# Patient Record
Sex: Female | Born: 1980 | Race: White | Hispanic: Yes | Marital: Single | Smoking: Current every day smoker
Health system: Southern US, Community
[De-identification: ages and names within clinical notes are randomized; demographics above are authoritative.]

## PROBLEM LIST (undated history)

## (undated) ENCOUNTER — Inpatient Hospital Stay (HOSPITAL_COMMUNITY): Payer: Self-pay

## (undated) DIAGNOSIS — J45909 Unspecified asthma, uncomplicated: Secondary | ICD-10-CM

## (undated) DIAGNOSIS — R45851 Suicidal ideations: Secondary | ICD-10-CM

## (undated) DIAGNOSIS — F429 Obsessive-compulsive disorder, unspecified: Secondary | ICD-10-CM

## (undated) DIAGNOSIS — M069 Rheumatoid arthritis, unspecified: Secondary | ICD-10-CM

## (undated) DIAGNOSIS — G40909 Epilepsy, unspecified, not intractable, without status epilepticus: Secondary | ICD-10-CM

## (undated) DIAGNOSIS — K5900 Constipation, unspecified: Secondary | ICD-10-CM

## (undated) DIAGNOSIS — Z9889 Other specified postprocedural states: Secondary | ICD-10-CM

## (undated) DIAGNOSIS — T4145XA Adverse effect of unspecified anesthetic, initial encounter: Secondary | ICD-10-CM

## (undated) DIAGNOSIS — I619 Nontraumatic intracerebral hemorrhage, unspecified: Secondary | ICD-10-CM

## (undated) DIAGNOSIS — H9190 Unspecified hearing loss, unspecified ear: Secondary | ICD-10-CM

## (undated) DIAGNOSIS — Q6589 Other specified congenital deformities of hip: Secondary | ICD-10-CM

## (undated) DIAGNOSIS — F603 Borderline personality disorder: Secondary | ICD-10-CM

## (undated) DIAGNOSIS — F329 Major depressive disorder, single episode, unspecified: Secondary | ICD-10-CM

## (undated) DIAGNOSIS — F4310 Post-traumatic stress disorder, unspecified: Secondary | ICD-10-CM

## (undated) DIAGNOSIS — R112 Nausea with vomiting, unspecified: Secondary | ICD-10-CM

## (undated) DIAGNOSIS — I10 Essential (primary) hypertension: Secondary | ICD-10-CM

## (undated) DIAGNOSIS — T8859XA Other complications of anesthesia, initial encounter: Secondary | ICD-10-CM

## (undated) DIAGNOSIS — F419 Anxiety disorder, unspecified: Secondary | ICD-10-CM

## (undated) DIAGNOSIS — J189 Pneumonia, unspecified organism: Secondary | ICD-10-CM

## (undated) DIAGNOSIS — R011 Cardiac murmur, unspecified: Secondary | ICD-10-CM

## (undated) DIAGNOSIS — F39 Unspecified mood [affective] disorder: Secondary | ICD-10-CM

## (undated) DIAGNOSIS — J3081 Allergic rhinitis due to animal (cat) (dog) hair and dander: Secondary | ICD-10-CM

## (undated) DIAGNOSIS — F319 Bipolar disorder, unspecified: Secondary | ICD-10-CM

## (undated) DIAGNOSIS — T1491XA Suicide attempt, initial encounter: Secondary | ICD-10-CM

## (undated) DIAGNOSIS — F209 Schizophrenia, unspecified: Secondary | ICD-10-CM

## (undated) DIAGNOSIS — F4312 Post-traumatic stress disorder, chronic: Secondary | ICD-10-CM

## (undated) HISTORY — DX: Suicide attempt, initial encounter: T14.91XA

## (undated) HISTORY — DX: Other specified congenital deformities of hip: Q65.89

## (undated) HISTORY — DX: Epilepsy, unspecified, not intractable, without status epilepticus: G40.909

## (undated) HISTORY — DX: Suicidal ideations: R45.851

## (undated) HISTORY — PX: OTHER SURGICAL HISTORY: SHX169

## (undated) HISTORY — DX: Major depressive disorder, single episode, unspecified: F32.9

## (undated) HISTORY — DX: Unspecified mood (affective) disorder: F39

## (undated) HISTORY — DX: Nontraumatic intracerebral hemorrhage, unspecified: I61.9

## (undated) HISTORY — DX: Unspecified asthma, uncomplicated: J45.909

## (undated) HISTORY — DX: Bipolar disorder, unspecified: F31.9

## (undated) HISTORY — DX: Allergic rhinitis due to animal (cat) (dog) hair and dander: J30.81

## (undated) HISTORY — DX: Anxiety disorder, unspecified: F41.9

## (undated) HISTORY — DX: Borderline personality disorder: F60.3

## (undated) HISTORY — DX: Rheumatoid arthritis, unspecified: M06.9

## (undated) HISTORY — DX: Cardiac murmur, unspecified: R01.1

## (undated) HISTORY — DX: Obsessive-compulsive disorder, unspecified: F42.9

## (undated) HISTORY — DX: Post-traumatic stress disorder, chronic: F43.12

---
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
## 2017-08-15 ENCOUNTER — Emergency Department (HOSPITAL_COMMUNITY)
Admission: EM | Admit: 2017-08-15 | Discharge: 2017-08-16 | Disposition: A | Discharge status: Other Acute Inpatient Hospital | Payer: Medicare (Managed Care) | Attending: Emergency Medicine | Admitting: Emergency Medicine

## 2017-08-15 ENCOUNTER — Encounter (HOSPITAL_COMMUNITY): Payer: Self-pay | Admitting: *Deleted

## 2017-08-15 DIAGNOSIS — J45909 Unspecified asthma, uncomplicated: Secondary | ICD-10-CM | POA: Insufficient documentation

## 2017-08-15 DIAGNOSIS — F251 Schizoaffective disorder, depressive type: Secondary | ICD-10-CM | POA: Diagnosis not present

## 2017-08-15 DIAGNOSIS — Z79899 Other long term (current) drug therapy: Secondary | ICD-10-CM | POA: Insufficient documentation

## 2017-08-15 DIAGNOSIS — R45851 Suicidal ideations: Secondary | ICD-10-CM | POA: Insufficient documentation

## 2017-08-15 DIAGNOSIS — R443 Hallucinations, unspecified: Secondary | ICD-10-CM | POA: Diagnosis present

## 2017-08-15 HISTORY — DX: Unspecified asthma, uncomplicated: J45.909

## 2017-08-15 LAB — SALICYLATE LEVEL: Salicylate Lvl: <7 mg/dL (ref 2.8–30.0)

## 2017-08-15 LAB — I-STAT BETA HCG BLOOD, ED (MC, WL, AP ONLY): I-stat hCG, quantitative: <5 m[IU]/mL (ref <5)

## 2017-08-15 LAB — CBC
HEMATOCRIT: 47.2 % — AB (ref 36.0–46.0)
HEMOGLOBIN: 16.3 g/dL — AB (ref 12.0–15.0)
MCH: 31.4 pg (ref 26.0–34.0)
MCHC: 34.5 g/dL (ref 30.0–36.0)
MCV: 90.9 fL (ref 78.0–100.0)
Platelets: 342 10*3/uL (ref 150–400)
RBC: 5.19 MIL/uL — AB (ref 3.87–5.11)
RDW: 13.9 % (ref 11.5–15.5)
WBC: 13.3 10*3/uL — ABNORMAL HIGH (ref 4.0–10.5)

## 2017-08-15 LAB — COMPREHENSIVE METABOLIC PANEL
ALBUMIN: 4.2 g/dL (ref 3.5–5.0)
ALK PHOS: 90 U/L (ref 38–126)
ALT: 12 U/L — AB (ref 14–54)
ANION GAP: 5 (ref 5–15)
AST: 17 U/L (ref 15–41)
BUN: 12 mg/dL (ref 6–20)
CALCIUM: 9.4 mg/dL (ref 8.9–10.3)
CHLORIDE: 110 mmol/L (ref 101–111)
CO2: 24 mmol/L (ref 22–32)
CREATININE: 0.78 mg/dL (ref 0.44–1.00)
GFR calc Af Amer: >60 mL/min (ref >60)
GFR calc non Af Amer: >60 mL/min (ref >60)
GLUCOSE: 89 mg/dL (ref 65–99)
Potassium: 4.2 mmol/L (ref 3.5–5.1)
SODIUM: 139 mmol/L (ref 135–145)
Total Bilirubin: 0.6 mg/dL (ref 0.3–1.2)
Total Protein: 7.4 g/dL (ref 6.5–8.1)

## 2017-08-15 LAB — ETHANOL: Alcohol, Ethyl (B): <10 mg/dL (ref <10)

## 2017-08-15 LAB — ACETAMINOPHEN LEVEL

## 2017-08-15 MED ORDER — QUETIAPINE FUMARATE 300 MG PO TABS
500.0000 mg | ORAL_TABLET | Freq: Every day | ORAL | Status: DC
  Administered 2017-08-15: 22:00:00 500 mg via ORAL
  Filled 2017-08-15: qty 2

## 2017-08-15 MED ORDER — OXYBUTYNIN CHLORIDE 5 MG PO TABS
5.0000 mg | ORAL_TABLET | Freq: Two times a day (BID) | ORAL | Status: DC
  Administered 2017-08-15 – 2017-08-16 (×2): 5 mg via ORAL
  Filled 2017-08-15 (×2): qty 1

## 2017-08-15 MED ORDER — ALBUTEROL SULFATE HFA 108 (90 BASE) MCG/ACT IN AERS: 1.0000 | INHALATION_SPRAY | Freq: Four times a day (QID) | RESPIRATORY_TRACT | Status: DC | PRN

## 2017-08-15 MED ORDER — LAMOTRIGINE 25 MG PO TABS
125.0000 mg | ORAL_TABLET | Freq: Two times a day (BID) | ORAL | Status: DC
  Administered 2017-08-15 – 2017-08-16 (×2): 125 mg via ORAL
  Filled 2017-08-15 (×2): qty 1

## 2017-08-15 MED ORDER — CITALOPRAM HYDROBROMIDE 10 MG PO TABS
20.0000 mg | ORAL_TABLET | Freq: Every morning | ORAL | Status: DC
  Administered 2017-08-16: 20 mg via ORAL
  Filled 2017-08-15: qty 2

## 2017-08-15 MED ORDER — QUETIAPINE FUMARATE 100 MG PO TABS: 100.0000 mg | ORAL_TABLET | Freq: Every evening | ORAL | Status: DC | PRN

## 2017-08-15 MED ORDER — PERPHENAZINE 4 MG PO TABS
4.0000 mg | ORAL_TABLET | Freq: Three times a day (TID) | ORAL | Status: DC
  Administered 2017-08-15 – 2017-08-16 (×3): 4 mg via ORAL
  Filled 2017-08-15 (×4): qty 1

## 2017-08-15 MED ORDER — LAMOTRIGINE 100 MG PO TABS: 100.0000 mg | ORAL_TABLET | Freq: Two times a day (BID) | ORAL | Status: DC

## 2017-08-15 MED ORDER — PROPRANOLOL HCL 20 MG PO TABS: 20.0000 mg | ORAL_TABLET | Freq: Three times a day (TID) | ORAL | Status: DC

## 2017-08-15 MED ORDER — PROPRANOLOL HCL 20 MG PO TABS
30.0000 mg | ORAL_TABLET | Freq: Three times a day (TID) | ORAL | Status: DC
  Administered 2017-08-15 (×2): 30 mg via ORAL
  Filled 2017-08-15 (×5): qty 1

## 2017-08-15 MED ORDER — LAMOTRIGINE 25 MG PO TABS: 25.0000 mg | ORAL_TABLET | Freq: Two times a day (BID) | ORAL | Status: DC

## 2017-08-15 MED ORDER — TOPIRAMATE 25 MG PO TABS
150.0000 mg | ORAL_TABLET | Freq: Every day | ORAL | Status: DC
  Administered 2017-08-15: 22:00:00 150 mg via ORAL
  Filled 2017-08-15: qty 1

## 2017-08-15 MED ORDER — QUETIAPINE FUMARATE 300 MG PO TABS: 400.0000 mg | ORAL_TABLET | Freq: Every day | ORAL | Status: DC

## 2017-08-15 MED ORDER — PROPRANOLOL HCL 10 MG PO TABS: 10.0000 mg | ORAL_TABLET | Freq: Three times a day (TID) | ORAL | Status: DC

## 2017-08-15 MED ORDER — ACETAMINOPHEN 325 MG PO TABS: 650.0000 mg | ORAL_TABLET | ORAL | Status: DC | PRN

## 2017-08-15 NOTE — ED Triage Notes (Signed)
Pt called mobile crisis d/'t SI, AV hallucinations. Pt states she is seeing shadow people that carry knives. Pt states she has had SI for the past few days.

## 2017-08-15 NOTE — Progress Notes (Signed)
Patient chart reviewed.  Patient meets inpatient criteria.  Referrals sent to the following:  [facility]  [facility]  [facility]        Disposition TTS and CSW's will continue to follow for placement.  Areatha Keas. Judi Cong, MSW, [place] Disposition Clinical Social Work [phone] (cell) [phone] (office)

## 2017-08-15 NOTE — ED Provider Notes (Signed)
[facility] DEPT Provider Note   CSN: [id number] Arrival date & time: 08/15/17  1353     History   Chief Complaint Chief Complaint  Patient presents with  . Suicidal  . Hallucinations    HPI Amanda [name] is a 36 y.o. female w PMHx of asthma, and reported psychiatric hx of MDD, anxiety, PTSD, OCD, insomnia, anorexia, self-harm, presenting to ED with worsening symptoms x few days of auditroy and visual hallucinations. Pt states "I see blood everywhere" and see "shadows with knives and green angry eyes" that are telling her to harm herself. She states if she were to end her life she would "cut."  She denies any homicidal ideations, recent self-harm, alcohol or drug use.  She states she has been taking her prescribed medicines as directed, however is due for her 3PM medications. Denies hx of schizophrenia, but states she has had auditory and visual hallucinations in the past. States her therapist is [place] at [facility].  The history is provided by the patient.    Past Medical History:  Diagnosis Date  . Asthma     There are no active problems to display for this patient.    OB History    No data available       Home Medications    Prior to Admission medications   Medication Sig Start Date End Date Taking? Authorizing Provider  acetaminophen (TYLENOL) 500 MG tablet Take 1,000 mg by mouth daily as needed (PAIN).   Yes [provider]  citalopram (CELEXA) 20 MG tablet Take 20 mg by mouth every morning.   Yes [provider]  lamoTRIgine (LAMICTAL) 100 MG tablet Take 100 mg by mouth 2 (two) times daily. TAKE WITH 25 MG TABLETS   Yes [provider]  lamoTRIgine (LAMICTAL) 25 MG tablet Take 25 mg by mouth 2 (two) times daily.   Yes [provider]  Omega-3 Fatty Acids (FISH OIL) 1000 MG CAPS Take 1,000 mg by mouth every morning.   Yes [provider]  oxybutynin (DITROPAN) 5 MG tablet Take 5 mg by mouth 2  (two) times daily.   Yes [provider]  perphenazine (TRILAFON) 4 MG tablet Take 4 mg by mouth 3 (three) times daily. AS DIRECTED   Yes [provider]  propranolol (INDERAL) 10 MG tablet Take 10 mg by mouth 3 (three) times daily. TAKE WITH 20 MG TABLETS   Yes [provider]  propranolol (INDERAL) 20 MG tablet Take 20 mg by mouth 3 (three) times daily. AS DIRECTED   Yes [provider]  QUEtiapine (SEROQUEL) 100 MG tablet Take 100 mg by mouth at bedtime as needed (SLEEP). AS NEEDED. COMBINE WITH 400 MG TABLET TO EQUAL 500 MG AT BEDTIME   Yes [provider]  QUEtiapine (SEROQUEL) 400 MG tablet Take 400 mg by mouth at bedtime.   Yes [provider]  topiramate (TOPAMAX) 100 MG tablet Take 150 mg by mouth at bedtime.   Yes [provider]    Family History No family history on file.  Social History Social History   Tobacco Use  . Smoking status: Not on file  Substance Use Topics  . Alcohol use: No    Frequency: Never  . Drug use: No     Allergies   Aspartame and phenylalanine; Benadryl [diphenhydramine]; Scallops [shellfish allergy]; and Tegretol [carbamazepine]   Review of Systems Review of Systems  Constitutional: Negative for fever.  Skin: Negative for wound.  Psychiatric/Behavioral: Positive for  hallucinations and suicidal ideas. Negative for self-injury. The patient is nervous/anxious.   All other systems reviewed and are negative.    Physical Exam Updated Vital Signs BP 122/86 (BP Location: Left Arm)   Pulse 75   Temp 97.8 F (36.6 C) (Oral)   Resp 18   Ht 5\' 8"  (1.727 m)   Wt 100.5 kg (221 lb 8 oz)   SpO2 99%   BMI 33.68 kg/m   Physical Exam  Constitutional: She is oriented to person, place, and time. She appears well-developed and well-nourished. No distress.  HENT:  Head: Normocephalic and atraumatic.  Eyes: Conjunctivae are normal.  Cardiovascular: Normal rate, regular rhythm, normal heart  sounds and intact distal pulses.  Pulmonary/Chest: Effort normal and breath sounds normal. No respiratory distress.  Abdominal: Soft. Bowel sounds are normal.  Neurological: She is alert and oriented to person, place, and time.  Mental Status:  Alert, oriented, thought content appropriate, able to give a coherent history. Speech fluent without evidence of aphasia. Able to follow 2 step commands without difficulty.  Cranial Nerves:  II:  Peripheral visual fields grossly normal, pupils equal, round, reactive to light III,IV, VI: ptosis not present, extra-ocular motions intact bilaterally  V,VII: smile symmetric, facial light touch sensation equal VIII: hearing grossly normal to voice  X: uvula elevates symmetrically  XI: bilateral shoulder shrug symmetric and strong XII: midline tongue extension without fassiculations Motor:  Normal tone. 5/5 in upper and lower extremities bilaterally including strong and equal grip strength and dorsiflexion/plantar flexion Sensory: Pinprick and light touch normal in all extremities.  Deep Tendon Reflexes: 2+ and symmetric in the biceps and patella Cerebellar: normal finger-to-nose with bilateral upper extremities Gait: normal gait and balance CV: distal pulses palpable throughout    Skin: Skin is warm.  No wounds  Psychiatric: She has a normal mood and affect. Her speech is normal and behavior is normal. She expresses suicidal ideation. She expresses no homicidal ideation. She expresses suicidal plans.  Pt calm and cooperative. Does not appear to be actively hallucinating during evaluation.   Nursing note and vitals reviewed.    ED Treatments / Results  Labs (all labs ordered are listed, but only abnormal results are displayed) Labs Reviewed  COMPREHENSIVE METABOLIC PANEL - Abnormal; Notable for the following components:      Result Value   ALT 12 (*)    All other components within normal limits  ACETAMINOPHEN LEVEL - Abnormal; Notable for the  following components:   Acetaminophen (Tylenol), Serum <10 (*)    All other components within normal limits  CBC - Abnormal; Notable for the following components:   WBC 13.3 (*)    RBC 5.19 (*)    Hemoglobin 16.3 (*)    HCT 47.2 (*)    All other components within normal limits  ETHANOL  SALICYLATE LEVEL  RAPID URINE DRUG SCREEN, HOSP PERFORMED  I-STAT BETA HCG BLOOD, ED ([facility], [facility], [facility] ONLY)    EKG  EKG Interpretation None       Radiology No results found.  Procedures Procedures (including critical care time)  Medications Ordered in ED Medications  acetaminophen (TYLENOL) tablet 650 mg (not administered)  albuterol (PROVENTIL HFA;VENTOLIN HFA) 108 (90 Base) MCG/ACT inhaler 1-2 puff (not administered)  citalopram (CELEXA) tablet 20 mg (not administered)  oxybutynin (DITROPAN) tablet 5 mg (5 mg Oral Given 08/15/17 2136)  perphenazine (TRILAFON) tablet 4 mg (4 mg Oral Given 08/15/17 2135)  topiramate (TOPAMAX) tablet 150 mg (150 mg Oral Given 08/15/17  2135)  lamoTRIgine (LAMICTAL) tablet 125 mg (125 mg Oral Given 08/15/17 2136)  propranolol (INDERAL) tablet 30 mg (30 mg Oral Given 08/15/17 1759)  QUEtiapine (SEROQUEL) tablet 500 mg (500 mg Oral Given 08/15/17 2135)     Initial Impression / Assessment and Plan / ED Course  I have reviewed the triage vital signs and the nursing notes.  Pertinent labs & imaging results that were available during my care of the patient were reviewed by me and considered in my medical decision making (see chart for details).     Pt presenting with AV hallucinations and SI. AV hallucinations reported as: "I see blood everywhere" and see "shadows with knives and green angry eyes" that are telling her to harm herself. Taking multiple psychiatric medications, seen by "Di Kindle at [facility] No medical complaints today. Denies drug or alcohol use. Exam reassuring. Labs pending. Pt is medically cleared for TTS evaluation.   TTS recommending psychiatric  admission.  The patient appears reasonably stabilized for admission considering the current resources, flow, and capabilities available in the ED at this time, and I doubt any other [facility] requiring further screening and/or treatment in the ED prior to admission.  Final Clinical Impressions(s) / ED Diagnoses   Final diagnoses:  Suicidal ideation  Hallucinations    ED Discharge Orders    None       Cap Massi, Martin[place] N, PA-C 08/15/17 2204    Sherwood Gambler, MD 08/16/17 0010

## 2017-08-15 NOTE — ED Notes (Signed)
Patient reports SI no plan and AVH. Patient denies HI. Plan of care discussed. Encouragement and support provided and safety maintain. Q 15 min safety checks remain in place and video monitoring.

## 2017-08-15 NOTE — ED Notes (Signed)
Bed: [facility] Expected date:  Expected time:  Means of arrival:  Comments: Consultation room
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
## 2017-08-15 NOTE — ED Notes (Signed)
Bed: [facility] Expected date:  Expected time:  Means of arrival:  Comments: 

## 2017-08-15 NOTE — BHH Counselor (Signed)
Clinician received a call from [facility] at [facility]. Pt has been accepted pending if she is willing to sign-in voluntary. Aldona Bar [phone]) asked to be contacted during day shift of the pt's decision. (Pt is currently sleeping.)   Pt if agrees. Pt has been accepted to the [facility], tomorrow after 10am. Attending physician: Jonelle Sports. Nursing report: [phone]. Updated disposition discussed with [place], RN.    Vertell Novak, MS, [facility], [facility] Triage Specialist [phone]

## 2017-08-15 NOTE — ED Notes (Signed)
Pt admitted to room #41. Pt endorsing AVH. "I've been seeing blood everywhere with evil shadow people with green eyes." Pt  Endorsing SI  Without plan. Pt denies HI. Pt reports she has been complaint with medication regimen. Encouragement and support provided. Special checks q 15 mins in place for safety, Video monitoring in place. Will continue to monitor.

## 2017-08-15 NOTE — ED Notes (Signed)
Bed: [facility] Expected date:  Expected time:  Means of arrival:  Comments: 

## 2017-08-16 DIAGNOSIS — F251 Schizoaffective disorder, depressive type: Secondary | ICD-10-CM | POA: Diagnosis present

## 2017-08-16 LAB — RAPID URINE DRUG SCREEN, HOSP PERFORMED
AMPHETAMINES: NOT DETECTED
BARBITURATES: NOT DETECTED
Benzodiazepines: NOT DETECTED
COCAINE: NOT DETECTED
OPIATES: NOT DETECTED
TETRAHYDROCANNABINOL: NOT DETECTED

## 2017-08-16 NOTE — BH Assessment (Signed)
[facility] Assessment Progress Note  This pt is currently under voluntary status.  At 09:09 this Probation officer discussed plan to transfer [facility] to [facility] with the pt; she agrees to transfer.  Buford Dresser, DO concurs with this plan.  Pt's nurse, Caren Griffins, has been notified.  Pt is to be transported via [employer].  Pt has signed Consent to Release information to Theodoro Kos, her provider at [facility], and I have called to notify her of disposition.  Jalene Mullet, [place] Coordinator [phone]

## 2017-08-16 NOTE — ED Notes (Signed)
[facility] called for transport. 

## 2017-08-16 NOTE — ED Notes (Signed)
Report called to Sherryll Burger at [facility].

## 2017-08-16 NOTE — ED Notes (Signed)
Patient reports she is still hearing voices, and feels suicidal.  She reports that her medications need adjusting because she has been taking her meds.

## 2017-08-20 NOTE — L&D Delivery Note (Signed)
Operative Delivery Note At 1:13 AM a viable female was delivered via Vaginal, Vacuum (Extractor) due to recurrent late decelerations.  Presentation: vertex; Position: Occiput,, Posterior; Station: +2.  Verbal consent: obtained from patient.  Risks and benefits discussed in detail.  Risks include, but are not limited to the risks of anesthesia, bleeding, infection, damage to maternal tissues, fetal cephalhematoma.  There is also the risk of inability to effect vaginal delivery of the head, or shoulder dystocia that cannot be resolved by established maneuvers, leading to the need for emergency cesarean section.  APGAR:7 , 9  .   Placenta status:intact with 3-vessel cord, .     Anesthesia:  Epidural Instruments: Vacuum  Episiotomy:  none Lacerations:  Second degree Suture Repair: 2.0 vicryl Est. Blood Loss (mL):  300  Mom to postpartum.  Baby to Couplet care / Skin to Skin.  Nancylee Gaines 07/26/2018, 1:41 AM

## 2017-11-06 ENCOUNTER — Encounter: Payer: Self-pay | Admitting: Neurology

## 2017-11-06 ENCOUNTER — Ambulatory Visit (INDEPENDENT_AMBULATORY_CARE_PROVIDER_SITE_OTHER): Payer: Self-pay | Admitting: Neurology

## 2017-11-06 ENCOUNTER — Telehealth: Payer: Self-pay | Admitting: Neurology

## 2017-11-06 ENCOUNTER — Encounter: Payer: Self-pay | Admitting: *Deleted

## 2017-11-06 VITALS — BP 106/71 | HR 69 | Ht 60.0 in | Wt 216.0 lb

## 2017-11-06 DIAGNOSIS — G40909 Epilepsy, unspecified, not intractable, without status epilepticus: Secondary | ICD-10-CM

## 2017-11-06 DIAGNOSIS — Q279 Congenital malformation of peripheral vascular system, unspecified: Secondary | ICD-10-CM

## 2017-11-06 DIAGNOSIS — R51 Headache: Secondary | ICD-10-CM

## 2017-11-06 DIAGNOSIS — R519 Headache, unspecified: Secondary | ICD-10-CM

## 2017-11-06 DIAGNOSIS — R569 Unspecified convulsions: Secondary | ICD-10-CM

## 2017-11-06 DIAGNOSIS — Q2730 Arteriovenous malformation, site unspecified: Secondary | ICD-10-CM

## 2017-11-06 MED ORDER — TOPIRAMATE 100 MG PO TABS: 150.0000 mg | ORAL_TABLET | Freq: Every day | ORAL | 6 refills | Status: DC

## 2017-11-06 NOTE — Patient Instructions (Signed)
Seizure, Adult A seizure is a sudden burst of abnormal electrical activity in the brain. The abnormal activity temporarily interrupts normal brain function, causing a person to experience any of the following:  Involuntary movements.  Changes in awareness or consciousness.  Uncontrollable shaking (convulsions).  Seizures usually last from 30 seconds to 2 minutes. They usually do not cause permanent brain damage unless they are prolonged. What can cause a seizure to happen? Seizures can happen for many reasons including:  A fever.  Low blood sugar.  A medicine.  An illnesses.  A brain injury.  Some people who have a seizure never have another one. People who have repeated seizures have a condition called epilepsy. What are the symptoms of a seizure? Symptoms of a seizure vary greatly from person to person. They include:  Convulsions.  Stiffening of the body.  Involuntary movements of the arms or legs.  Loss of consciousness.  Breathing problems.  Falling suddenly.  Confusion.  Head nodding.  Eye blinking or fluttering.  Lip smacking.  Drooling.  Rapid eye movements.  Grunting.  Loss of bladder control and bowel control.  Staring.  Unresponsiveness.  Some people have symptoms right before a seizure happens (aura) and right after a seizure happens. Symptoms of an aura include:  Fear or anxiety.  Nausea.  Feeling like the room is spinning (vertigo).  A feeling of having seen or heard something before (deja vu).  Odd tastes or smells.  Changes in vision, such as seeing flashing lights or spots.  Symptoms that may follow a seizure include:  Confusion.  Sleepiness.  Headache.  Weakness of one side of the body.  Follow these instructions at home: Medicines   Take over-the-counter and prescription medicines only as told by your health care provider.  Avoid any substances that may prevent your medicine from working properly, such as  alcohol. Activity  Do not drive, swim, or do any other activities that would be dangerous if you had another seizure. Wait until your health care provider approves.  If you live in the [place] check with your local DMV (department of motor vehicles) to find out about the local driving laws. Each state has specific rules about when you can legally return to driving.  Get enough rest. Lack of sleep can make seizures more likely to occur. Educating others Teach friends and family what to do if you have a seizure. They should:  Lay you on the ground to prevent a fall.  Cushion your head and body.  Loosen any tight clothing around your neck.  Turn you on your side. If vomiting occurs, this helps keep your airway clear.  Stay with you until you recover.  Not hold you down. Holding you down will not stop the seizure.  Not put anything in your mouth.  Know whether or not you need emergency care.  General instructions  Contact your health care provider each time you have a seizure.  Avoid anything that has ever triggered a seizure for you.  Keep a seizure diary. Record what you remember about each seizure, especially anything that might have triggered the seizure.  Keep all follow-up visits as told by your health care provider. This is important. Contact a health care provider if:  You have another seizure.  You have seizures more often.  Your seizure symptoms change.  You continue to have seizures with treatment.  You have symptoms of an infection or illness. They might increase your risk of having a seizure. Get help   right away if:  You have a seizure: ? That lasts longer than 5 minutes. ? That is different than previous seizures. ? That leaves you unable to speak or use a part of your body. ? That makes it harder to breathe. ? After a head injury.  You have: ? Multiple seizures in a row. ? Confusion or a severe headache right after a seizure.  You are having  seizures more often.  You do not wake up immediately after a seizure.  You injure yourself during a seizure. These symptoms may represent a serious problem that is an emergency. Do not wait to see if the symptoms will go away. Get medical help right away. Call your local emergency services (911 in the [place] Do not drive yourself to the hospital. This information is not intended to replace advice given to you by your health care provider. Make sure you discuss any questions you have with your health care provider. Document Released: 08/03/2000 Document Revised: 04/01/2016 Document Reviewed: 03/09/2016 Elsevier Interactive Patient Education  2018 [employer]  

## 2017-11-06 NOTE — Telephone Encounter (Signed)
pt medicare is from [place] and she will have to be self pay bc it will not cover anything for [place] order sent to [facility] they will reach out to the patient to schedule.
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
## 2017-11-06 NOTE — Telephone Encounter (Signed)
Patient states she will call us back to schedule her one year follow-up w/ NP, [name] Done.

## 2017-11-08 ENCOUNTER — Telehealth: Payer: Self-pay | Admitting: *Deleted

## 2017-11-08 NOTE — Telephone Encounter (Addendum)
Request faxed to [facility] requesting records, lab test, EEG, Mri to [phone] fax [phone]. On 11/11/17 r/c medical records from [facility], records on [place] desk.

## 2017-11-17 ENCOUNTER — Ambulatory Visit
Admission: RE | Admit: 2017-11-17 | Discharge: 2017-11-17 | Disposition: A | Discharge status: Home / Self Care | Payer: Medicare (Managed Care) | Source: Ambulatory Visit | Attending: Neurology | Admitting: Neurology

## 2017-11-17 DIAGNOSIS — G8929 Other chronic pain: Secondary | ICD-10-CM

## 2017-11-17 DIAGNOSIS — Q2730 Arteriovenous malformation, site unspecified: Secondary | ICD-10-CM

## 2017-11-17 DIAGNOSIS — R569 Unspecified convulsions: Secondary | ICD-10-CM

## 2017-11-17 DIAGNOSIS — R51 Headache: Secondary | ICD-10-CM

## 2017-11-17 DIAGNOSIS — Q279 Congenital malformation of peripheral vascular system, unspecified: Secondary | ICD-10-CM

## 2017-11-17 MED ORDER — GADOBENATE DIMEGLUMINE 529 MG/ML IV SOLN
20.0000 mL | Freq: Once | INTRAVENOUS | Status: AC | PRN
  Administered 2017-11-17: 20 mL via INTRAVENOUS

## 2017-11-20 ENCOUNTER — Telehealth: Payer: Self-pay | Admitting: Neurology

## 2017-11-20 ENCOUNTER — Telehealth: Payer: Self-pay | Admitting: *Deleted

## 2017-11-20 DIAGNOSIS — Q279 Congenital malformation of peripheral vascular system, unspecified: Secondary | ICD-10-CM

## 2017-11-20 DIAGNOSIS — Q2730 Arteriovenous malformation, site unspecified: Secondary | ICD-10-CM

## 2017-11-20 NOTE — Telephone Encounter (Signed)
Medicare order sent to [facility] they will reach out to pt to schedule.

## 2017-11-20 NOTE — Telephone Encounter (Signed)
-----   Message from [name], MD sent at 11/19/2017  3:29 PM EDT ----- MRI if the brain was normal. I would like to do another test, an MRA of the blood vessels since we did not see anything on MRI brain. If she is willing please order for vascular malformations

## 2017-11-20 NOTE — Telephone Encounter (Signed)
Spoke with patient regarding MRI brain. She is aware that it was normal. However, Dr. Jaynee Eagles would like to order an MRA of the vessels since it was normal. RN asked for pt's thoughts and she agreed to having the MRA. RN advised patient that order will be placed and then pt will receive a call for the scheduling. She verbalized appreciation.   MRA head order placed for vascular malformation per v.o. From Dr. Jaynee Eagles.

## 2017-11-30 ENCOUNTER — Other Ambulatory Visit: Payer: Self-pay

## 2017-11-30 ENCOUNTER — Emergency Department (HOSPITAL_COMMUNITY)
Admission: EM | Admit: 2017-11-30 | Discharge: 2017-11-30 | Disposition: A | Discharge status: Home / Self Care | Payer: Medicare (Managed Care) | Attending: Emergency Medicine | Admitting: Emergency Medicine

## 2017-11-30 ENCOUNTER — Encounter (HOSPITAL_COMMUNITY): Payer: Self-pay | Admitting: Emergency Medicine

## 2017-11-30 DIAGNOSIS — Z3491 Encounter for supervision of normal pregnancy, unspecified, first trimester: Secondary | ICD-10-CM | POA: Insufficient documentation

## 2017-11-30 DIAGNOSIS — Z3490 Encounter for supervision of normal pregnancy, unspecified, unspecified trimester: Secondary | ICD-10-CM

## 2017-11-30 DIAGNOSIS — Z3A00 Weeks of gestation of pregnancy not specified: Secondary | ICD-10-CM | POA: Insufficient documentation

## 2017-11-30 DIAGNOSIS — J45909 Unspecified asthma, uncomplicated: Secondary | ICD-10-CM | POA: Insufficient documentation

## 2017-11-30 DIAGNOSIS — F17210 Nicotine dependence, cigarettes, uncomplicated: Secondary | ICD-10-CM | POA: Diagnosis not present

## 2017-11-30 DIAGNOSIS — O219 Vomiting of pregnancy, unspecified: Secondary | ICD-10-CM

## 2017-11-30 DIAGNOSIS — Z79899 Other long term (current) drug therapy: Secondary | ICD-10-CM | POA: Insufficient documentation

## 2017-11-30 DIAGNOSIS — R112 Nausea with vomiting, unspecified: Secondary | ICD-10-CM

## 2017-11-30 LAB — COMPREHENSIVE METABOLIC PANEL
ALT: 12 U/L — ABNORMAL LOW (ref 14–54)
ANION GAP: 8 (ref 5–15)
AST: 15 U/L (ref 15–41)
Albumin: 3.8 g/dL (ref 3.5–5.0)
Alkaline Phosphatase: 99 U/L (ref 38–126)
BUN: 9 mg/dL (ref 6–20)
CHLORIDE: 108 mmol/L (ref 101–111)
CO2: 19 mmol/L — ABNORMAL LOW (ref 22–32)
Calcium: 9.1 mg/dL (ref 8.9–10.3)
Creatinine, Ser: 0.74 mg/dL (ref 0.44–1.00)
GFR calc Af Amer: >60 mL/min (ref >60)
Glucose, Bld: 100 mg/dL — ABNORMAL HIGH (ref 65–99)
Potassium: 3.7 mmol/L (ref 3.5–5.1)
Sodium: 135 mmol/L (ref 135–145)
Total Bilirubin: 0.5 mg/dL (ref 0.3–1.2)
Total Protein: 7.1 g/dL (ref 6.5–8.1)

## 2017-11-30 LAB — CBC
HCT: 45.8 % (ref 36.0–46.0)
Hemoglobin: 15.9 g/dL — ABNORMAL HIGH (ref 12.0–15.0)
MCH: 30.6 pg (ref 26.0–34.0)
MCHC: 34.7 g/dL (ref 30.0–36.0)
MCV: 88.1 fL (ref 78.0–100.0)
Platelets: 319 10*3/uL (ref 150–400)
RBC: 5.2 MIL/uL — AB (ref 3.87–5.11)
RDW: 13.4 % (ref 11.5–15.5)
WBC: 17.9 10*3/uL — AB (ref 4.0–10.5)

## 2017-11-30 LAB — URINALYSIS, ROUTINE W REFLEX MICROSCOPIC
BACTERIA UA: NONE SEEN
BILIRUBIN URINE: NEGATIVE
Glucose, UA: NEGATIVE mg/dL
Ketones, ur: NEGATIVE mg/dL
LEUKOCYTES UA: NEGATIVE
NITRITE: NEGATIVE
PROTEIN: NEGATIVE mg/dL
SPECIFIC GRAVITY, URINE: 1.024 (ref 1.005–1.030)
pH: 5 (ref 5.0–8.0)

## 2017-11-30 LAB — HCG, QUANTITATIVE, PREGNANCY: hCG, Beta Chain, Quant, S: 24 m[IU]/mL — ABNORMAL HIGH (ref <5)

## 2017-11-30 LAB — I-STAT BETA HCG BLOOD, ED (MC, WL, AP ONLY): HCG, QUANTITATIVE: 20.2 m[IU]/mL — AB (ref <5)

## 2017-11-30 LAB — LIPASE, BLOOD: LIPASE: 24 U/L (ref 11–51)

## 2017-11-30 MED ORDER — ONDANSETRON 4 MG PO TBDP: ORAL_TABLET | ORAL | 0 refills | Status: DC

## 2017-11-30 MED ORDER — ONDANSETRON 4 MG PO TBDP
4.0000 mg | ORAL_TABLET | Freq: Once | ORAL | Status: AC
  Administered 2017-11-30: 4 mg via ORAL
  Filled 2017-11-30: qty 1

## 2017-11-30 NOTE — ED Provider Notes (Signed)
[facility] EMERGENCY DEPARTMENT Provider Note   CSN: [id number] Arrival date & time: 11/30/17  1517     History   Chief Complaint Chief Complaint  Patient presents with  . Emesis    HPI Amanda [name] is a 37 y.o. female.  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 Patient is sensitive to certain sense and has only tolerated hot pockets. Abundant jelly with icing Patient denies any vaginal bleeding or abdominal pain. Patient denies any neurologic symptoms. No headaches.  No sick contacts.     Past Medical History:  Diagnosis Date  . Anxiety   . Asthma   . Asthma due to environmental allergies   . Asthma due to seasonal allergies   . Bipolar 1 disorder ([place])   . Borderline personality disorder ([place])   . Brain bleed ([place])   . Chronic post-traumatic stress disorder (PTSD)    complex chronic with psychotic features and self harm behaviors  . Dander (animal) allergy   . Heart murmur   . Hip dysplasia   . Major depression, chronic   . Mood disorder ([place])   . OCD (obsessive compulsive disorder)   . Rheumatoid arthritis ([place])   . Seizure disorder ([place])   . Suicidal ideations   . Suicide attempt [facility])     Patient Active Problem List   Diagnosis Date Noted  . Seizure disorder ([place]) 11/06/2017    Past Surgical History:  Procedure Laterality Date  . EYE SURGERY  1985  . SHUNT REMOVAL  1983  . Tooth Removal     multiple     OB History   None      Home Medications    Prior to Admission medications   Medication Sig Start Date End Date Taking? Authorizing Provider  albuterol (PROVENTIL HFA;VENTOLIN HFA) 108 (90 Base) MCG/ACT inhaler Inhale 2 puffs into the lungs every 6  (six) hours as needed for wheezing or shortness of breath.    [provider]  EPINEPHrine 0.3 mg/0.3 mL IJ SOAJ injection Inject 0.3 mg into the muscle. Use as directed    [provider]  lamoTRIgine (LAMICTAL) 100 MG tablet Take 100 mg by mouth 2 (two) times daily.    [provider]  lamoTRIgine (LAMICTAL) 25 MG tablet Take 25 mg by mouth 2 (two) times daily.    [provider]  Omega-3 Fatty Acids (FISH OIL) 1000 MG CAPS Take 1 capsule by mouth 3 (three) times daily after meals.     [provider]  ondansetron (ZOFRAN ODT) 4 MG disintegrating tablet 4mg  ODT q4 hours prn nausea/vomit 11/30/17   [name], MD  oxybutynin (DITROPAN) 5 MG tablet Take 5 mg by mouth 2 (two) times daily.    [provider]  perphenazine (TRILAFON) 4 MG tablet Take by mouth. 2 mg in AM, 4 mg in afternoon, 4 mg at bedtime    [provider]  propranolol (INDERAL) 10 MG tablet Take 10 mg by mouth 3 (three) times daily.    [provider]  propranolol (INDERAL) 20 MG tablet Take 20 mg by mouth 3 (three) times daily.    [provider]  QUEtiapine (SEROQUEL) 300 MG tablet Take 600 mg by mouth  at bedtime.    [provider]  topiramate (TOPAMAX) 100 MG tablet Take 1.5 tablets (150 mg total) by mouth at bedtime. 11/06/17   [name], MD    Family History Family History  Problem Relation Age of Onset  . Heart attack Mother   . Stroke Mother   . Liver cancer Mother   . Diabetes Mother   . Lung cancer Mother   . Alcoholism Father   . Sleep apnea Brother   . Depression Brother   . ADD / ADHD Brother     Social History Social History   Tobacco Use  . Smoking status: Current Every Day Smoker    Packs/day: 0.50    Types: Cigarettes  . Smokeless tobacco: Never Used  Substance Use Topics  . Alcohol use: Yes    Comment: rare wine  . Drug use: No     Allergies   Bee venom; Benadryl [diphenhydramine]; Other;  Pollen extract; Scallops [shellfish allergy]; and Tegretol [carbamazepine]   Review of Systems Review of Systems  Constitutional: Negative for chills and fever.  HENT: Negative for congestion.   Eyes: Negative for visual disturbance.  Respiratory: Negative for shortness of breath.   Cardiovascular: Negative for chest pain.  Gastrointestinal: Positive for nausea and vomiting. Negative for abdominal pain.  Genitourinary: Negative for dysuria and flank pain.  Musculoskeletal: Negative for back pain, neck pain and neck stiffness.  Skin: Negative for rash.  Neurological: Negative for light-headedness and headaches.     Physical Exam Updated Vital Signs BP (!) 155/89 (BP Location: Right Arm)   Pulse 75   Temp 98.6 F (37 C) (Oral)   Resp 18   Ht 5\' 8"  (1.727 m)   Wt 96.6 kg (213 lb)   LMP 11/07/2017   SpO2 100%   BMI 32.39 kg/m   Physical Exam  Constitutional: She is oriented to person, place, and time. She appears well-developed and well-nourished.  HENT:  Head: Normocephalic and atraumatic.  Eyes: Conjunctivae are normal. Right eye exhibits no discharge. Left eye exhibits no discharge.  Neck: Normal range of motion. Neck supple. No tracheal deviation present.  Cardiovascular: Normal rate and regular rhythm.  Pulmonary/Chest: Effort normal and breath sounds normal.  Abdominal: Soft. She exhibits no distension. There is no tenderness. There is no guarding.  Musculoskeletal: She exhibits no edema.  Neurological: She is alert and oriented to person, place, and time. She has normal strength. No cranial nerve deficit or sensory deficit. She displays no seizure activity. Coordination normal. GCS eye subscore is 4. GCS verbal subscore is 5. GCS motor subscore is 6.  Skin: Skin is warm. No rash noted.  Psychiatric: She has a normal mood and affect.  Nursing note and vitals reviewed.    ED Treatments / Results  Labs (all labs ordered are listed, but only abnormal results are  displayed) Labs Reviewed  COMPREHENSIVE METABOLIC PANEL - Abnormal; Notable for the following components:      Result Value   CO2 19 (*)    Glucose, Bld 100 (*)    ALT 12 (*)    All other components within normal limits  CBC - Abnormal; Notable for the following components:   WBC 17.9 (*)    RBC 5.20 (*)    Hemoglobin 15.9 (*)    All other components within normal limits  URINALYSIS, ROUTINE W REFLEX MICROSCOPIC - Abnormal; Notable for the following components:   APPearance HAZY (*)    Hgb urine dipstick SMALL (*)  Squamous Epithelial / LPF 0-5 (*)    All other components within normal limits  HCG, QUANTITATIVE, PREGNANCY - Abnormal; Notable for the following components:   hCG, Beta Chain, Quant, S 24 (*)    All other components within normal limits  I-STAT BETA HCG BLOOD, ED ([facility], [facility], [facility] ONLY) - Abnormal; Notable for the following components:   I-stat hCG, quantitative 20.2 (*)    All other components within normal limits  LIPASE, BLOOD    EKG None  Radiology No results found.  Procedures Procedures (including critical care time)  Medications Ordered in ED Medications  ondansetron (ZOFRAN-ODT) disintegrating tablet 4 mg (4 mg Oral Given 11/30/17 1855)     Initial Impression / Assessment and Plan / ED Course  I have reviewed the triage vital signs and the nursing notes.  Pertinent labs & imaging results that were available during my care of the patient were reviewed by me and considered in my medical decision making (see chart for details).    Patient presents with recurrent nausea vomiting without abdominal pain. Patient has normal neurologic exam is not having any concerning headaches. Patient is well-appearing smiling and joking with her boyfriend. Patient feels she may be pregnant. I-STAT urgency test was minimally positive and extremely low at 20. Formal blood test ordered. Discussed close follow-up with primary doctor in 2-3 days for reassesment of her  symptoms.  Formal blood pregnancy test minimally positive. Discussed close follow-up with obstetrics. Is having no abdominal pain or vaginal bleeding. No formal ultrasound needed today as it is unlikely to change management.  Results and differential diagnosis were discussed with the patient/parent/guardian. Xrays were independently reviewed by myself.  Close follow up outpatient was discussed, comfortable with the plan.   Medications  ondansetron (ZOFRAN-ODT) disintegrating tablet 4 mg (4 mg Oral Given 11/30/17 1855)    Vitals:   11/30/17 1545 11/30/17 1549 11/30/17 1550  BP: (!) 155/89    Pulse: 75    Resp:  18   Temp: 98.6 F (37 C)    TempSrc: Oral    SpO2: 100%    Weight:   96.6 kg (213 lb)  Height:   5\' 8"  (1.727 m)    Final diagnoses:  Nausea and vomiting in adult patient  Early stage of pregnancy  Vomiting or nausea of pregnancy    Final Clinical Impressions(s) / ED Diagnoses   Final diagnoses:  Nausea and vomiting in adult patient  Early stage of pregnancy  Vomiting or nausea of pregnancy    ED Discharge Orders        Ordered    ondansetron (ZOFRAN ODT) 4 MG disintegrating tablet     11/30/17 1934       [name], MD 11/30/17 1937

## 2017-11-30 NOTE — ED Triage Notes (Signed)
Patient presents to ED for assessment of random cravings (hot pocket with peanut butter and icing), nausea with eating or mint smells, and concerns/excitement that she may be pregnant.    Patient's partner states he has also been has vomited once.  PAtient states she has been vomiting daily for a few weeks.

## 2017-11-30 NOTE — ED Notes (Signed)
Pt states "I think I am pregnant-- I tppk 2 pregnancy tests- one negative and one maybe results- I am unable to eat anything but hot pockets with peanut butter and jelly and icing."

## 2017-11-30 NOTE — Discharge Instructions (Addendum)
Use Zofran as needed for nausea and vomiting. Follow-up with primary doctor as instructed. Return to the ER for any neurologic symptoms, abdominal pain, vaginal bleeding or new concerns.  Make sure you see OB doctor this week.

## 2017-12-04 ENCOUNTER — Telehealth: Payer: Self-pay | Admitting: Neurology

## 2017-12-04 ENCOUNTER — Encounter: Payer: Self-pay | Admitting: Neurology

## 2017-12-04 ENCOUNTER — Ambulatory Visit: Payer: Self-pay | Admitting: Neurology

## 2017-12-04 ENCOUNTER — Telehealth: Payer: Self-pay | Admitting: *Deleted

## 2017-12-04 MED ORDER — LEVETIRACETAM 500 MG PO TABS: 500.0000 mg | ORAL_TABLET | Freq: Two times a day (BID) | ORAL | 5 refills | Status: DC

## 2017-12-04 NOTE — Telephone Encounter (Addendum)
Dr. Jaynee Eagles aware that pt has r/s her appt. Dr. Jaynee Eagles has ordered Keppra 500 mg BID.   Called pt and informed her that Dr. Jaynee Eagles would like for her start Keppra 500 mg 2 times daily right away. Prescription e-scribed to pt's pharmacy. She is aware that Dr. Jaynee Eagles still needs to see her in the office. Pt stated that she had a seizure last night. She had a headache afterward and naturally does not want to do anything that hurts the baby. She is ok today. She will start the [place] today. Tomorrow she will see her psychiatrist to discuss medications as well. She verbalized appreciation.

## 2017-12-04 NOTE — Telephone Encounter (Signed)
Pt cancelled appt on same day 12/04/2017.

## 2017-12-04 NOTE — Telephone Encounter (Signed)
Patient has already stopped Topiramate for 2 weeks without letting us know. At this point do not restart Topiramate but can start Keppra. Usually we do not change seizure management after patient is pregnant however she has already stopped her Topiramate on her own and given teratogenicity will start Keppra instead. Seizures may cause more harm to the fetus than the seizure medications so we do not recommend stopping seizures medicine. However since she has not been taking the Topiramate, will start Keppra instead.

## 2017-12-04 NOTE — Telephone Encounter (Signed)
Patient is pregnant and wants to know if she can continue taking topiramate (TOPAMAX) 100 MG tablet.

## 2017-12-04 NOTE — Addendum Note (Signed)
Addended by: Gildardo Griffes on: 12/04/2017 12:06 PM   Modules accepted: Orders

## 2017-12-04 NOTE — Telephone Encounter (Signed)
Dr. Jaynee Eagles aware. Stop Topamax now.   Spoke with the patient. She stated that she has not been taking any of her medications because she has been unable to keep anything down and she also does not want to harm the baby. She hasn't taken Topamax for 2 weeks. She found out that she is 2-[redacted] weeks pregnant. She is aware that Topamax is risky for pregnancy and that Dr. Jaynee Eagles said do not take Topamax. She is in the process of making appts to see her specialists & PCP to discuss medications while pregnant.  Pt scheduled today w/ Dr. Jaynee Eagles @ 2:00 pm arrival time 1:30 to discuss options for seizure control. She verbalized appreciation.

## 2017-12-04 NOTE — Telephone Encounter (Signed)
Pt requesting a call back, stating she is unable to make appt for today 4/17 due to not being able to find a ride r/s for 5/14 but is needing to know what to do until then. Please call to advise

## 2017-12-31 ENCOUNTER — Ambulatory Visit: Payer: Self-pay | Admitting: Neurology

## 2017-12-31 ENCOUNTER — Telehealth: Payer: Self-pay | Admitting: *Deleted

## 2017-12-31 NOTE — Telephone Encounter (Signed)
Patient no showed appointment today 12/31/2017 @ 10:30.

## 2018-01-01 ENCOUNTER — Encounter: Payer: Self-pay | Admitting: Neurology

## 2018-01-08 ENCOUNTER — Encounter: Payer: Self-pay | Admitting: Family Medicine

## 2018-01-08 ENCOUNTER — Encounter (HOSPITAL_COMMUNITY): Payer: Self-pay | Admitting: *Deleted

## 2018-01-08 ENCOUNTER — Ambulatory Visit (INDEPENDENT_AMBULATORY_CARE_PROVIDER_SITE_OTHER): Payer: Medicare (Managed Care) | Admitting: Family Medicine

## 2018-01-08 ENCOUNTER — Other Ambulatory Visit: Payer: Self-pay | Admitting: Family Medicine

## 2018-01-08 DIAGNOSIS — Z23 Encounter for immunization: Secondary | ICD-10-CM | POA: Diagnosis not present

## 2018-01-08 DIAGNOSIS — F429 Obsessive-compulsive disorder, unspecified: Secondary | ICD-10-CM | POA: Insufficient documentation

## 2018-01-08 DIAGNOSIS — G40909 Epilepsy, unspecified, not intractable, without status epilepticus: Secondary | ICD-10-CM

## 2018-01-08 DIAGNOSIS — Z1151 Encounter for screening for human papillomavirus (HPV): Secondary | ICD-10-CM

## 2018-01-08 DIAGNOSIS — O0990 Supervision of high risk pregnancy, unspecified, unspecified trimester: Secondary | ICD-10-CM

## 2018-01-08 DIAGNOSIS — F4310 Post-traumatic stress disorder, unspecified: Secondary | ICD-10-CM | POA: Insufficient documentation

## 2018-01-08 DIAGNOSIS — O99350 Diseases of the nervous system complicating pregnancy, unspecified trimester: Secondary | ICD-10-CM

## 2018-01-08 DIAGNOSIS — Z124 Encounter for screening for malignant neoplasm of cervix: Secondary | ICD-10-CM

## 2018-01-08 DIAGNOSIS — F422 Mixed obsessional thoughts and acts: Secondary | ICD-10-CM

## 2018-01-08 DIAGNOSIS — F329 Major depressive disorder, single episode, unspecified: Secondary | ICD-10-CM

## 2018-01-08 DIAGNOSIS — O99351 Diseases of the nervous system complicating pregnancy, first trimester: Secondary | ICD-10-CM

## 2018-01-08 DIAGNOSIS — Z113 Encounter for screening for infections with a predominantly sexual mode of transmission: Secondary | ICD-10-CM | POA: Diagnosis not present

## 2018-01-08 DIAGNOSIS — O09511 Supervision of elderly primigravida, first trimester: Secondary | ICD-10-CM

## 2018-01-08 DIAGNOSIS — J45909 Unspecified asthma, uncomplicated: Secondary | ICD-10-CM | POA: Insufficient documentation

## 2018-01-08 DIAGNOSIS — O9989 Other specified diseases and conditions complicating pregnancy, childbirth and the puerperium: Principal | ICD-10-CM

## 2018-01-08 DIAGNOSIS — R8271 Bacteriuria: Secondary | ICD-10-CM

## 2018-01-08 DIAGNOSIS — O99891 Other specified diseases and conditions complicating pregnancy: Secondary | ICD-10-CM

## 2018-01-08 DIAGNOSIS — F603 Borderline personality disorder: Secondary | ICD-10-CM

## 2018-01-08 DIAGNOSIS — O09519 Supervision of elderly primigravida, unspecified trimester: Secondary | ICD-10-CM | POA: Insufficient documentation

## 2018-01-08 DIAGNOSIS — F3181 Bipolar II disorder: Secondary | ICD-10-CM

## 2018-01-08 DIAGNOSIS — J4520 Mild intermittent asthma, uncomplicated: Secondary | ICD-10-CM

## 2018-01-08 LAB — POCT URINALYSIS DIP (DEVICE)
BILIRUBIN URINE: NEGATIVE
Glucose, UA: NEGATIVE mg/dL
HGB URINE DIPSTICK: NEGATIVE
Leukocytes, UA: NEGATIVE
Nitrite: POSITIVE — AB
Protein, ur: NEGATIVE mg/dL
Specific Gravity, Urine: 1.02 (ref 1.005–1.030)
UROBILINOGEN UA: 1 mg/dL (ref 0.0–1.0)
pH: 7 (ref 5.0–8.0)

## 2018-01-08 MED ORDER — CONCEPT OB 130-92.4-1 MG PO CAPS: 1.0000 | ORAL_CAPSULE | Freq: Every day | ORAL | 11 refills | Status: DC

## 2018-01-08 MED ORDER — FOLIC ACID 1 MG PO TABS: 1.0000 mg | ORAL_TABLET | Freq: Every day | ORAL | 2 refills | Status: DC

## 2018-01-08 NOTE — Patient Instructions (Signed)
First Trimester of Pregnancy The first trimester of pregnancy is from week 1 until the end of week 13 (months 1 through 3). A week after a sperm fertilizes an egg, the egg will implant on the wall of the uterus. This embryo will begin to develop into a baby. Genes from you and your partner will form the baby. The female genes will determine whether the baby will be a boy or a girl. At 6-8 weeks, the eyes and face will be formed, and the heartbeat can be seen on ultrasound. At the end of 12 weeks, all the baby's organs will be formed. Now that you are pregnant, you will want to do everything you can to have a healthy baby. Two of the most important things are to get good prenatal care and to follow your health care provider's instructions. Prenatal care is all the medical care you receive before the baby's birth. This care will help prevent, find, and treat any problems during the pregnancy and childbirth. Body changes during your first trimester Your body goes through many changes during pregnancy. The changes vary from woman to woman.  You may gain or lose a couple of pounds at first.  You may feel sick to your stomach (nauseous) and you may throw up (vomit). If the vomiting is uncontrollable, call your health care provider.  You may tire easily.  You may develop headaches that can be relieved by medicines. All medicines should be approved by your health care provider.  You may urinate more often. Painful urination may mean you have a bladder infection.  You may develop heartburn as a result of your pregnancy.  You may develop constipation because certain hormones are causing the muscles that push stool through your intestines to slow down.  You may develop hemorrhoids or swollen veins (varicose veins).  Your breasts may begin to grow larger and become tender. Your nipples may stick out more, and the tissue that surrounds them (areola) may become darker.  Your gums may bleed and may be  sensitive to brushing and flossing.  Dark spots or blotches (chloasma, mask of pregnancy) may develop on your face. This will likely fade after the baby is born.  Your menstrual periods will stop.  You may have a loss of appetite.  You may develop cravings for certain kinds of food.  You may have changes in your emotions from day to day, such as being excited to be pregnant or being concerned that something may go wrong with the pregnancy and baby.  You may have more vivid and strange dreams.  You may have changes in your hair. These can include thickening of your hair, rapid growth, and changes in texture. Some women also have hair loss during or after pregnancy, or hair that feels dry or thin. Your hair will most likely return to normal after your baby is born.  What to expect at prenatal visits During a routine prenatal visit:  You will be weighed to make sure you and the baby are growing normally.  Your blood pressure will be taken.  Your abdomen will be measured to track your baby's growth.  The fetal heartbeat will be listened to between weeks 10 and 14 of your pregnancy.  Test results from any previous visits will be discussed.  Your health care provider may ask you:  How you are feeling.  If you are feeling the baby move.  If you have had any abnormal symptoms, such as leaking fluid, bleeding, severe  headaches, or abdominal cramping.  If you are using any tobacco products, including cigarettes, chewing tobacco, and electronic cigarettes.  If you have any questions.  Other tests that may be performed during your first trimester include:  Blood tests to find your blood type and to check for the presence of any previous infections. The tests will also be used to check for low iron levels (anemia) and protein on red blood cells (Rh antibodies). Depending on your risk factors, or if you previously had diabetes during pregnancy, you may have tests to check for high blood  sugar that affects pregnant women (gestational diabetes).  Urine tests to check for infections, diabetes, or protein in the urine.  An ultrasound to confirm the proper growth and development of the baby.  Fetal screens for spinal cord problems (spina bifida) and Down syndrome.  HIV (human immunodeficiency virus) testing. Routine prenatal testing includes screening for HIV, unless you choose not to have this test.  You may need other tests to make sure you and the baby are doing well.  Follow these instructions at home: Medicines  Follow your health care provider's instructions regarding medicine use. Specific medicines may be either safe or unsafe to take during pregnancy.  Take a prenatal vitamin that contains at least 600 micrograms (mcg) of folic acid.  If you develop constipation, try taking a stool softener if your health care provider approves. Eating and drinking  Eat a balanced diet that includes fresh fruits and vegetables, whole grains, good sources of protein such as meat, eggs, or tofu, and low-fat dairy. Your health care provider will help you determine the amount of weight gain that is right for you.  Avoid raw meat and uncooked cheese. These carry germs that can cause birth defects in the baby.  Eating four or five small meals rather than three large meals a day may help relieve nausea and vomiting. If you start to feel nauseous, eating a few soda crackers can be helpful. Drinking liquids between meals, instead of during meals, also seems to help ease nausea and vomiting.  Limit foods that are high in fat and processed sugars, such as fried and sweet foods.  To prevent constipation: ? Eat foods that are high in fiber, such as fresh fruits and vegetables, whole grains, and beans. ? Drink enough fluid to keep your urine clear or pale yellow. Activity  Exercise only as directed by your health care provider. Most women can continue their usual exercise routine during  pregnancy. Try to exercise for 30 minutes at least 5 days a week. Exercising will help you: ? Control your weight. ? Stay in shape. ? Be prepared for labor and delivery.  Experiencing pain or cramping in the lower abdomen or lower back is a good sign that you should stop exercising. Check with your health care provider before continuing with normal exercises.  Try to avoid standing for long periods of time. Move your legs often if you must stand in one place for a long time.  Avoid heavy lifting.  Wear low-heeled shoes and practice good posture.  You may continue to have sex unless your health care provider tells you not to. Relieving pain and discomfort  Wear a good support bra to relieve breast tenderness.  Take warm sitz baths to soothe any pain or discomfort caused by hemorrhoids. Use hemorrhoid cream if your health care provider approves.  Rest with your legs elevated if you have leg cramps or low back pain.  If you  develop varicose veins in your legs, wear support hose. Elevate your feet for 15 minutes, 3-4 times a day. Limit salt in your diet. Prenatal care  Schedule your prenatal visits by the twelfth week of pregnancy. They are usually scheduled monthly at first, then more often in the last 2 months before delivery.  Write down your questions. Take them to your prenatal visits.  Keep all your prenatal visits as told by your health care provider. This is important. Safety  Wear your seat belt at all times when driving.  Make a list of emergency phone numbers, including numbers for family, friends, the hospital, and police and fire departments. General instructions  Ask your health care provider for a referral to a local prenatal education class. Begin classes no later than the beginning of month 6 of your pregnancy.  Ask for help if you have counseling or nutritional needs during pregnancy. Your health care provider can offer advice or refer you to specialists for help  with various needs.  Do not use hot tubs, steam rooms, or saunas.  Do not douche or use tampons or scented sanitary pads.  Do not cross your legs for long periods of time.  Avoid cat litter boxes and soil used by cats. These carry germs that can cause birth defects in the baby and possibly loss of the fetus by miscarriage or stillbirth.  Avoid all smoking, herbs, alcohol, and medicines not prescribed by your health care provider. Chemicals in these products affect the formation and growth of the baby.  Do not use any products that contain nicotine or tobacco, such as cigarettes and e-cigarettes. If you need help quitting, ask your health care provider. You may receive counseling support and other resources to help you quit.  Schedule a dentist appointment. At home, brush your teeth with a soft toothbrush and be gentle when you floss. Contact a health care provider if:  You have dizziness.  You have mild pelvic cramps, pelvic pressure, or nagging pain in the abdominal area.  You have persistent nausea, vomiting, or diarrhea.  You have a bad smelling vaginal discharge.  You have pain when you urinate.  You notice increased swelling in your face, hands, legs, or ankles.  You are exposed to fifth disease or chickenpox.  You are exposed to German measles (rubella) and have never had it. Get help right away if:  You have a fever.  You are leaking fluid from your vagina.  You have spotting or bleeding from your vagina.  You have severe abdominal cramping or pain.  You have rapid weight gain or loss.  You vomit blood or material that looks like coffee grounds.  You develop a severe headache.  You have shortness of breath.  You have any kind of trauma, such as from a fall or a car accident. Summary  The first trimester of pregnancy is from week 1 until the end of week 13 (months 1 through 3).  Your body goes through many changes during pregnancy. The changes vary from  woman to woman.  You will have routine prenatal visits. During those visits, your health care provider will examine you, discuss any test results you may have, and talk with you about how you are feeling. This information is not intended to replace advice given to you by your health care provider. Make sure you discuss any questions you have with your health care provider. Document Released: 07/31/2001 Document Revised: 07/18/2016 Document Reviewed: 07/18/2016 Elsevier Interactive Patient Education  2018   [place].   Breastfeeding Choosing to breastfeed is one of the best decisions you can make for yourself and your baby. A change in hormones during pregnancy causes your breasts to make breast milk in your milk-producing glands. Hormones prevent breast milk from being released before your baby is born. They also prompt milk flow after birth. Once breastfeeding has begun, thoughts of your baby, as well as his or her sucking or crying, can stimulate the release of milk from your milk-producing glands. Benefits of breastfeeding Research shows that breastfeeding offers many health benefits for infants and mothers. It also offers a cost-free and convenient way to feed your baby. For your baby  Your first milk (colostrum) helps your baby's digestive system to function better.  Special cells in your milk (antibodies) help your baby to fight off infections.  Breastfed babies are less likely to develop asthma, allergies, obesity, or type 2 diabetes. They are also at lower risk for sudden infant death syndrome (SIDS).  Nutrients in breast milk are better able to meet your baby's needs compared to infant formula.  Breast milk improves your baby's brain development. For you  Breastfeeding helps to create a very special bond between you and your baby.  Breastfeeding is convenient. Breast milk costs nothing and is always available at the correct temperature.  Breastfeeding helps to burn calories.  It helps you to lose the weight that you gained during pregnancy.  Breastfeeding makes your uterus return faster to its size before pregnancy. It also slows bleeding (lochia) after you give birth.  Breastfeeding helps to lower your risk of developing type 2 diabetes, osteoporosis, rheumatoid arthritis, cardiovascular disease, and breast, ovarian, uterine, and endometrial cancer later in life. Breastfeeding basics Starting breastfeeding  Find a comfortable place to sit or lie down, with your neck and back well-supported.  Place a pillow or a rolled-up blanket under your baby to bring him or her to the level of your breast (if you are seated). Nursing pillows are specially designed to help support your arms and your baby while you breastfeed.  Make sure that your baby's tummy (abdomen) is facing your abdomen.  Gently massage your breast. With your fingertips, massage from the outer edges of your breast inward toward the nipple. This encourages milk flow. If your milk flows slowly, you may need to continue this action during the feeding.  Support your breast with 4 fingers underneath and your thumb above your nipple (make the letter "C" with your hand). Make sure your fingers are well away from your nipple and your baby's mouth.  Stroke your baby's lips gently with your finger or nipple.  When your baby's mouth is open wide enough, quickly bring your baby to your breast, placing your entire nipple and as much of the areola as possible into your baby's mouth. The areola is the colored area around your nipple. ? More areola should be visible above your baby's upper lip than below the lower lip. ? Your baby's lips should be opened and extended outward (flanged) to ensure an adequate, comfortable latch. ? Your baby's tongue should be between his or her lower gum and your breast.  Make sure that your baby's mouth is correctly positioned around your nipple (latched). Your baby's lips should create a  seal on your breast and be turned out (everted).  It is common for your baby to suck about 2-3 minutes in order to start the flow of breast milk. Latching Teaching your baby how to latch onto  your breast properly is very important. An improper latch can cause nipple pain, decreased milk supply, and poor weight gain in your baby. Also, if your baby is not latched onto your nipple properly, he or she may swallow some air during feeding. This can make your baby fussy. Burping your baby when you switch breasts during the feeding can help to get rid of the air. However, teaching your baby to latch on properly is still the best way to prevent fussiness from swallowing air while breastfeeding. Signs that your baby has successfully latched onto your nipple  Silent tugging or silent sucking, without causing you pain. Infant's lips should be extended outward (flanged).  Swallowing heard between every 3-4 sucks once your milk has started to flow (after your let-down milk reflex occurs).  Muscle movement above and in front of his or her ears while sucking.  Signs that your baby has not successfully latched onto your nipple  Sucking sounds or smacking sounds from your baby while breastfeeding.  Nipple pain.  If you think your baby has not latched on correctly, slip your finger into the corner of your baby's mouth to break the suction and place it between your baby's gums. Attempt to start breastfeeding again. Signs of successful breastfeeding Signs from your baby  Your baby will gradually decrease the number of sucks or will completely stop sucking.  Your baby will fall asleep.  Your baby's body will relax.  Your baby will retain a small amount of milk in his or her mouth.  Your baby will let go of your breast by himself or herself.  Signs from you  Breasts that have increased in firmness, weight, and size 1-3 hours after feeding.  Breasts that are softer immediately after  breastfeeding.  Increased milk volume, as well as a change in milk consistency and color by the fifth day of breastfeeding.  Nipples that are not sore, cracked, or bleeding.  Signs that your baby is getting enough milk  Wetting at least 1-2 diapers during the first 24 hours after birth.  Wetting at least 5-6 diapers every 24 hours for the first week after birth. The urine should be clear or pale yellow by the age of 5 days.  Wetting 6-8 diapers every 24 hours as your baby continues to grow and develop.  At least 3 stools in a 24-hour period by the age of 5 days. The stool should be soft and yellow.  At least 3 stools in a 24-hour period by the age of 7 days. The stool should be seedy and yellow.  No loss of weight greater than 10% of birth weight during the first 3 days of life.  Average weight gain of 4-7 oz (113-198 g) per week after the age of 4 days.  Consistent daily weight gain by the age of 5 days, without weight loss after the age of 2 weeks. After a feeding, your baby may spit up a small amount of milk. This is normal. Breastfeeding frequency and duration Frequent feeding will help you make more milk and can prevent sore nipples and extremely full breasts (breast engorgement). Breastfeed when you feel the need to reduce the fullness of your breasts or when your baby shows signs of hunger. This is called "breastfeeding on demand." Signs that your baby is hungry include:  Increased alertness, activity, or restlessness.  Movement of the head from side to side.  Opening of the mouth when the corner of the mouth or cheek is stroked (rooting).  Increased sucking sounds, smacking lips, cooing, sighing, or squeaking.  Hand-to-mouth movements and sucking on fingers or hands.  Fussing or crying.  Avoid introducing a pacifier to your baby in the first 4-6 weeks after your baby is born. After this time, you may choose to use a pacifier. Research has shown that pacifier use during  the first year of a baby's life decreases the risk of sudden infant death syndrome (SIDS). Allow your baby to feed on each breast as long as he or she wants. When your baby unlatches or falls asleep while feeding from the first breast, offer the second breast. Because newborns are often sleepy in the first few weeks of life, you may need to awaken your baby to get him or her to feed. Breastfeeding times will vary from baby to baby. However, the following rules can serve as a guide to help you make sure that your baby is properly fed:  Newborns (babies 33 weeks of age or younger) may breastfeed every 1-3 hours.  Newborns should not go without breastfeeding for longer than 3 hours during the day or 5 hours during the night.  You should breastfeed your baby a minimum of 8 times in a 24-hour period.  Breast milk pumping Pumping and storing breast milk allows you to make sure that your baby is exclusively fed your breast milk, even at times when you are unable to breastfeed. This is especially important if you go back to work while you are still breastfeeding, or if you are not able to be present during feedings. Your lactation consultant can help you find a method of pumping that works best for you and give you guidelines about how long it is safe to store breast milk. Caring for your breasts while you breastfeed Nipples can become dry, cracked, and sore while breastfeeding. The following recommendations can help keep your breasts moisturized and healthy:  Avoid using soap on your nipples.  Wear a supportive bra designed especially for nursing. Avoid wearing underwire-style bras or extremely tight bras (sports bras).  Air-dry your nipples for 3-4 minutes after each feeding.  Use only cotton bra pads to absorb leaked breast milk. Leaking of breast milk between feedings is normal.  Use lanolin on your nipples after breastfeeding. Lanolin helps to maintain your skin's normal moisture barrier. Pure  lanolin is not harmful (not toxic) to your baby. You may also hand express a few drops of breast milk and gently massage that milk into your nipples and allow the milk to air-dry.  In the first few weeks after giving birth, some women experience breast engorgement. Engorgement can make your breasts feel heavy, warm, and tender to the touch. Engorgement peaks within 3-5 days after you give birth. The following recommendations can help to ease engorgement:  Completely empty your breasts while breastfeeding or pumping. You may want to start by applying warm, moist heat (in the shower or with warm, water-soaked hand towels) just before feeding or pumping. This increases circulation and helps the milk flow. If your baby does not completely empty your breasts while breastfeeding, pump any extra milk after he or she is finished.  Apply ice packs to your breasts immediately after breastfeeding or pumping, unless this is too uncomfortable for you. To do this: ? Put ice in a plastic bag. ? Place a towel between your skin and the bag. ? Leave the ice on for 20 minutes, 2-3 times a day.  Make sure that your baby is latched on and  positioned properly while breastfeeding.  If engorgement persists after 48 hours of following these recommendations, contact your health care provider or a Science writer. Overall health care recommendations while breastfeeding  Eat 3 healthy meals and 3 snacks every day. Well-nourished mothers who are breastfeeding need an additional 450-500 calories a day. You can meet this requirement by increasing the amount of a balanced diet that you eat.  Drink enough water to keep your urine pale yellow or clear.  Rest often, relax, and continue to take your prenatal vitamins to prevent fatigue, stress, and low vitamin and mineral levels in your body (nutrient deficiencies).  Do not use any products that contain nicotine or tobacco, such as cigarettes and e-cigarettes. Your baby may  be harmed by chemicals from cigarettes that pass into breast milk and exposure to secondhand smoke. If you need help quitting, ask your health care provider.  Avoid alcohol.  Do not use illegal drugs or marijuana.  Talk with your health care provider before taking any medicines. These include over-the-counter and prescription medicines as well as vitamins and herbal supplements. Some medicines that may be harmful to your baby can pass through breast milk.  It is possible to become pregnant while breastfeeding. If birth control is desired, ask your health care provider about options that will be safe while breastfeeding your baby. Where to find more information: [employer]: www.llli.org Contact a health care provider if:  You feel like you want to stop breastfeeding or have become frustrated with breastfeeding.  Your nipples are cracked or bleeding.  Your breasts are red, tender, or warm.  You have: ? Painful breasts or nipples. ? A swollen area on either breast. ? A fever or chills. ? Nausea or vomiting. ? Drainage other than breast milk from your nipples.  Your breasts do not become full before feedings by the fifth day after you give birth.  You feel sad and depressed.  Your baby is: ? Too sleepy to eat well. ? Having trouble sleeping. ? More than 72 week old and wetting fewer than 6 diapers in a 24-hour period. ? Not gaining weight by 66 days of age.  Your baby has fewer than 3 stools in a 24-hour period.  Your baby's skin or the white parts of his or her eyes become yellow. Get help right away if:  Your baby is overly tired (lethargic) and does not want to wake up and feed.  Your baby develops an unexplained fever. Summary  Breastfeeding offers many health benefits for infant and mothers.  Try to breastfeed your infant when he or she shows early signs of hunger.  Gently tickle or stroke your baby's lips with your finger or nipple to allow the  baby to open his or her mouth. Bring the baby to your breast. Make sure that much of the areola is in your baby's mouth. Offer one side and burp the baby before you offer the other side.  Talk with your health care provider or lactation consultant if you have questions or you face problems as you breastfeed. This information is not intended to replace advice given to you by your health care provider. Make sure you discuss any questions you have with your health care provider. Document Released: 08/06/2005 Document Revised: 09/07/2016 Document Reviewed: 09/07/2016 Elsevier Interactive Patient Education  [employer].

## 2018-01-08 NOTE — Progress Notes (Signed)
Subjective:    Amanda [name] is a G2P0010 [redacted]w[redacted]d being seen today for her first obstetrical visit.  Her obstetrical history is significant for advanced maternal age and seizure disorder and multiple psychiatric diagnoses.  Pregnancy history fully reviewed.  Patient reports nausea and constipation.  Vitals:   01/08/18 1048  BP: 120/80  Pulse: 66  Weight: 206 lb (93.4 kg)    HISTORY: OB History  Gravida Para Term Preterm AB Living  2 0 0 0 1 0  SAB TAB Ectopic Multiple Live Births  1 0 0 0 0    # Outcome Date GA Lbr Len/2nd Weight Sex Delivery Anes PTL Lv  2 Current           1 SAB  [redacted]w[redacted]d   M       Past Medical History:  Diagnosis Date  . Anxiety   . Asthma   . Asthma due to environmental allergies   . Asthma due to seasonal allergies   . Bipolar 1 disorder ([place])   . Borderline personality disorder ([place])   . Brain bleed ([place])   . Chronic post-traumatic stress disorder (PTSD)    complex chronic with psychotic features and self harm behaviors  . Dander (animal) allergy   . Heart murmur   . Hip dysplasia   . Major depression, chronic   . Mood disorder ([place])   . OCD (obsessive compulsive disorder)   . Rheumatoid arthritis ([place])   . Seizure disorder ([place])   . Suicidal ideations   . Suicide attempt [facility])    Past Surgical History:  Procedure Laterality Date  . EYE SURGERY  1985  . SHUNT REMOVAL  1983  . Tooth Removal     multiple   Family History  Problem Relation Age of Onset  . Heart attack Mother   . Stroke Mother   . Liver cancer Mother   . Diabetes Mother   . Lung cancer Mother   . Alcoholism Father   . Sleep apnea Brother   . Depression Brother   . ADD / ADHD Brother   . Diabetes Maternal Aunt   . Diabetes Paternal Uncle   . Diabetes Maternal Grandmother      Exam    Uterus:   8 wk size  Pelvic Exam:    Perineum: Normal Perineum   Vulva: Bartholin's, Urethra, Skene's normal, female escutcheon   Vagina:  normal mucosa, normal discharge   Cervix: no bleeding following Pap and no lesions   Adnexa: normal adnexa   Bony Pelvis: average  System: Breast:  normal appearance, no masses or tenderness   Skin: normal coloration and turgor, no rashes    Neurologic: oriented   Extremities: normal strength, tone, and muscle mass   HEENT extra ocular movement intact and sclera clear, anicteric   Mouth/Teeth mucous membranes moist, pharynx normal without lesions and dental hygiene poor   Neck supple   Cardiovascular: regular rate and rhythm, no murmurs or gallops   Respiratory:  appears well, vitals normal, no respiratory distress, acyanotic, normal RR, ear and throat exam is normal, neck free of mass or lymphadenopathy, chest clear, no wheezing, crepitations, rhonchi, normal symmetric air entry   Abdomen: soft, non-tender; bowel sounds normal; no masses,  no organomegaly      Assessment/Plan:    Pregnancy: G2P0010 1. Supervision of high risk pregnancy, antepartum New OB labs today - Culture, OB Urine - CHL AMB BABYSCRIPTS OPT IN - Cystic fibrosis gene test - Cytology - PAP - Flu  Vaccine QUAD 36+ mos IM - Hemoglobinopathy Evaluation - Obstetric Panel, Including HIV - SMN1 COPY NUMBER ANALYSIS (SMA Carrier Screen) - Prenat w/o A Vit-FeFum-FePo-FA (CONCEPT OB) 130-92.4-1 MG CAPS; Take 1 capsule by mouth daily.  Dispense: 180 capsule; Refill: 11 - folic acid (FOLVITE) 1 MG tablet; Take 1 tablet (1 mg total) by mouth daily.  Dispense: 90 tablet; Refill: 2  2. Seizure disorder during pregnancy in first trimester ([place]) Continue Keppra, no recent seizure activity  3. Primigravida of advanced maternal age in first trimester Elects for NIPT--will draw at next visit.  4. Bipolar 2 disorder ([place]) See Jamie at next visit--need to consult with Dr. Dwyane Dee on meds in pregnancy  5. Borderline personality disorder ([place]) In therapry  6. Major depression, chronic   7. PTSD (post-traumatic stress disorder)   8. Mixed obsessional  thoughts and acts   9. Mild intermittent asthma without complication Has Albuterol--not using more than 2x/wk at present   Return in 1 month (on 02/05/2018).  Amanda [name] 01/08/2018

## 2018-01-08 NOTE — Progress Notes (Signed)
Bedside ultrasound performed to evaluate FHR, FHR 175 bpm.  Patient needs medication for nausea/vomiting. Also needs Rx for PNV. Patient reports currently living in hotel.

## 2018-01-10 ENCOUNTER — Telehealth: Payer: Self-pay | Admitting: Clinical

## 2018-01-10 NOTE — Telephone Encounter (Signed)
Attempt to contact pt to set up appointment with [facility] at [facility]; no voicemail set up, no message left.

## 2018-01-11 LAB — URINE CULTURE, OB REFLEX

## 2018-01-11 LAB — CULTURE, OB URINE

## 2018-01-14 ENCOUNTER — Encounter: Payer: Self-pay | Admitting: Family Medicine

## 2018-01-14 ENCOUNTER — Other Ambulatory Visit: Payer: Self-pay | Admitting: General Practice

## 2018-01-14 DIAGNOSIS — R87810 Cervical high risk human papillomavirus (HPV) DNA test positive: Secondary | ICD-10-CM | POA: Insufficient documentation

## 2018-01-14 DIAGNOSIS — O0990 Supervision of high risk pregnancy, unspecified, unspecified trimester: Secondary | ICD-10-CM

## 2018-01-14 LAB — CYTOLOGY - PAP
ADEQUACY: ABSENT
Chlamydia: NEGATIVE
Diagnosis: NEGATIVE
HPV (WINDOPATH): DETECTED — AB
HPV 16/18/45 genotyping: NEGATIVE
NEISSERIA GONORRHEA: NEGATIVE

## 2018-01-14 MED ORDER — PRENATAL VITAMINS 0.8 MG PO TABS: 1.0000 | ORAL_TABLET | Freq: Every day | ORAL | 12 refills | Status: DC

## 2018-01-15 MED ORDER — CEPHALEXIN 500 MG PO CAPS: 500.0000 mg | ORAL_CAPSULE | Freq: Four times a day (QID) | ORAL | 0 refills | Status: DC

## 2018-01-15 NOTE — Addendum Note (Signed)
Addended by: Donnamae Jude on: 01/15/2018 02:59 PM   Modules accepted: Orders

## 2018-01-16 LAB — OBSTETRIC PANEL, INCLUDING HIV
ANTIBODY SCREEN: NEGATIVE
BASOS: 0 %
Basophils Absolute: 0 10*3/uL (ref 0.0–0.2)
EOS (ABSOLUTE): 0.1 10*3/uL (ref 0.0–0.4)
EOS: 1 %
HEMATOCRIT: 44.2 % (ref 34.0–46.6)
HEMOGLOBIN: 15 g/dL (ref 11.1–15.9)
HIV SCREEN 4TH GENERATION: NONREACTIVE
Hepatitis B Surface Ag: NEGATIVE
Immature Grans (Abs): 0 10*3/uL (ref 0.0–0.1)
Immature Granulocytes: 0 %
LYMPHS: 18 %
Lymphocytes Absolute: 2.7 10*3/uL (ref 0.7–3.1)
MCH: 29.9 pg (ref 26.6–33.0)
MCHC: 33.9 g/dL (ref 31.5–35.7)
MCV: 88 fL (ref 79–97)
MONOS ABS: 0.7 10*3/uL (ref 0.1–0.9)
Monocytes: 5 %
NEUTROS ABS: 10.9 10*3/uL — AB (ref 1.4–7.0)
Neutrophils: 76 %
Platelets: 298 10*3/uL (ref 150–450)
RBC: 5.01 x10E6/uL (ref 3.77–5.28)
RDW: 14.5 % (ref 12.3–15.4)
RH TYPE: POSITIVE
RPR Ser Ql: NONREACTIVE
RUBELLA: 2.52 {index} (ref >0.99)
WBC: 14.4 10*3/uL — ABNORMAL HIGH (ref 3.4–10.8)

## 2018-01-16 LAB — SMN1 COPY NUMBER ANALYSIS (SMA CARRIER SCREENING)

## 2018-01-16 LAB — HEMOGLOBINOPATHY EVALUATION
Ferritin: 49 ng/mL (ref 15–150)
HGB A: 97.8 % (ref 96.4–98.8)
HGB C: 0 %
HGB F QUANT: 0 % (ref 0.0–2.0)
HGB S: 0 %
HGB SOLUBILITY: NEGATIVE
Hgb A2 Quant: 2.2 % (ref 1.8–3.2)
Hgb Variant: 0 %

## 2018-01-16 LAB — CYSTIC FIBROSIS GENE TEST

## 2018-01-26 ENCOUNTER — Encounter (HOSPITAL_COMMUNITY): Payer: Self-pay | Admitting: Emergency Medicine

## 2018-01-26 ENCOUNTER — Other Ambulatory Visit: Payer: Self-pay

## 2018-01-26 ENCOUNTER — Emergency Department (HOSPITAL_COMMUNITY): Payer: Medicare (Managed Care)

## 2018-01-26 ENCOUNTER — Emergency Department (HOSPITAL_COMMUNITY)
Admission: EM | Admit: 2018-01-26 | Discharge: 2018-01-26 | Disposition: A | Discharge status: Home / Self Care | Payer: Medicare (Managed Care) | Attending: Emergency Medicine | Admitting: Emergency Medicine

## 2018-01-26 DIAGNOSIS — O99891 Other specified diseases and conditions complicating pregnancy: Secondary | ICD-10-CM

## 2018-01-26 DIAGNOSIS — R102 Pelvic and perineal pain: Secondary | ICD-10-CM | POA: Insufficient documentation

## 2018-01-26 DIAGNOSIS — O99331 Smoking (tobacco) complicating pregnancy, first trimester: Secondary | ICD-10-CM | POA: Diagnosis not present

## 2018-01-26 DIAGNOSIS — R1030 Lower abdominal pain, unspecified: Secondary | ICD-10-CM

## 2018-01-26 DIAGNOSIS — F17210 Nicotine dependence, cigarettes, uncomplicated: Secondary | ICD-10-CM | POA: Insufficient documentation

## 2018-01-26 DIAGNOSIS — Z79899 Other long term (current) drug therapy: Secondary | ICD-10-CM | POA: Diagnosis not present

## 2018-01-26 DIAGNOSIS — O99511 Diseases of the respiratory system complicating pregnancy, first trimester: Secondary | ICD-10-CM | POA: Insufficient documentation

## 2018-01-26 DIAGNOSIS — O9989 Other specified diseases and conditions complicating pregnancy, childbirth and the puerperium: Secondary | ICD-10-CM

## 2018-01-26 DIAGNOSIS — O219 Vomiting of pregnancy, unspecified: Secondary | ICD-10-CM | POA: Insufficient documentation

## 2018-01-26 DIAGNOSIS — Z3A11 11 weeks gestation of pregnancy: Secondary | ICD-10-CM | POA: Insufficient documentation

## 2018-01-26 LAB — URINALYSIS, ROUTINE W REFLEX MICROSCOPIC
Bilirubin Urine: NEGATIVE
Glucose, UA: NEGATIVE mg/dL
KETONES UR: 5 mg/dL — AB
NITRITE: NEGATIVE
PH: 6 (ref 5.0–8.0)
Protein, ur: NEGATIVE mg/dL
Specific Gravity, Urine: 1.01 (ref 1.005–1.030)

## 2018-01-26 LAB — HCG, QUANTITATIVE, PREGNANCY: hCG, Beta Chain, Quant, S: 106567 m[IU]/mL — ABNORMAL HIGH (ref <5)

## 2018-01-26 LAB — CBC
HEMATOCRIT: 43.7 % (ref 36.0–46.0)
HEMOGLOBIN: 15.2 g/dL — AB (ref 12.0–15.0)
MCH: 30.3 pg (ref 26.0–34.0)
MCHC: 34.8 g/dL (ref 30.0–36.0)
MCV: 87.1 fL (ref 78.0–100.0)
Platelets: 286 10*3/uL (ref 150–400)
RBC: 5.02 MIL/uL (ref 3.87–5.11)
RDW: 13.5 % (ref 11.5–15.5)
WBC: 13.6 10*3/uL — AB (ref 4.0–10.5)

## 2018-01-26 LAB — BASIC METABOLIC PANEL
ANION GAP: 10 (ref 5–15)
BUN: 7 mg/dL (ref 6–20)
CHLORIDE: 106 mmol/L (ref 101–111)
CO2: 21 mmol/L — AB (ref 22–32)
Calcium: 9.3 mg/dL (ref 8.9–10.3)
Creatinine, Ser: 0.54 mg/dL (ref 0.44–1.00)
GFR calc Af Amer: >60 mL/min (ref >60)
GFR calc non Af Amer: >60 mL/min (ref >60)
Glucose, Bld: 76 mg/dL (ref 65–99)
POTASSIUM: 3.9 mmol/L (ref 3.5–5.1)
Sodium: 137 mmol/L (ref 135–145)

## 2018-01-26 MED ORDER — CEPHALEXIN 250 MG PO CAPS: 500.0000 mg | ORAL_CAPSULE | Freq: Four times a day (QID) | ORAL | 0 refills | Status: DC

## 2018-01-26 MED ORDER — ONDANSETRON 4 MG PO TBDP: 4.0000 mg | ORAL_TABLET | Freq: Three times a day (TID) | ORAL | 0 refills | Status: DC | PRN

## 2018-01-26 NOTE — ED Provider Notes (Signed)
Pt signed out to me from [place], PA-C.  Pt [redacted] weeks pregnant here with lower abd pain.  Labs showing mild leukocytosis of 13.6, HcG quant as expected level, UA with evidence of UTI.  Abd/pelvis US demonstrates IUP at [redacted] weeks along with tiny subchroric hemorrhage.  Pt is well appearing, mild RLQ tenderness.  Low suspicion for appy but pt made aware to return if her sxs worsen or if she has concern.  Pt d/c home with Keflex for UTI and Zofran for nausea.    BP 103/70   Pulse 77   Temp 97.8 F (36.6 C) (Oral)   Resp 18   LMP 11/07/2017   SpO2 99%   Results for orders placed or performed during the hospital encounter of 01/26/18  CBC  Result Value Ref Range   WBC 13.6 (H) 4.0 - 10.5 K/uL   RBC 5.02 3.87 - 5.11 MIL/uL   Hemoglobin 15.2 (H) 12.0 - 15.0 g/dL   HCT 43.7 36.0 - 46.0 %   MCV 87.1 78.0 - 100.0 fL   MCH 30.3 26.0 - 34.0 pg   MCHC 34.8 30.0 - 36.0 g/dL   RDW 13.5 11.5 - 15.5 %   Platelets 286 150 - 400 K/uL  Basic metabolic panel  Result Value Ref Range   Sodium 137 135 - 145 mmol/L   Potassium 3.9 3.5 - 5.1 mmol/L   Chloride 106 101 - 111 mmol/L   CO2 21 (L) 22 - 32 mmol/L   Glucose, Bld 76 65 - 99 mg/dL   BUN 7 6 - 20 mg/dL   Creatinine, Ser 0.54 0.44 - 1.00 mg/dL   Calcium 9.3 8.9 - 10.3 mg/dL   GFR calc non Af Amer >60 >60 mL/min   GFR calc Af Amer >60 >60 mL/min   Anion gap 10 5 - 15  hCG, quantitative, pregnancy  Result Value Ref Range   hCG, Beta Chain, Quant, S 106,567 (H) <5 mIU/mL  Urinalysis, Routine w reflex microscopic  Result Value Ref Range   Color, Urine YELLOW YELLOW   APPearance HAZY (A) CLEAR   Specific Gravity, Urine 1.010 1.005 - 1.030   pH 6.0 5.0 - 8.0   Glucose, UA NEGATIVE NEGATIVE mg/dL   Hgb urine dipstick SMALL (A) NEGATIVE   Bilirubin Urine NEGATIVE NEGATIVE   Ketones, ur 5 (A) NEGATIVE mg/dL   Protein, ur NEGATIVE NEGATIVE mg/dL   Nitrite NEGATIVE NEGATIVE   Leukocytes, UA SMALL (A) NEGATIVE   RBC / HPF 0-5 0 - 5 RBC/hpf   WBC, UA  21-50 0 - 5 WBC/hpf   Bacteria, UA MANY (A) NONE SEEN   Squamous Epithelial / LPF 21-50 0 - 5   Mucus PRESENT    US Ob Comp < 14 Wks  Result Date: 01/26/2018 CLINICAL DATA:  Lower back pain and pelvic cramping since this morning, pregnant, quantitative beta HCG = 034,742 EXAM: OBSTETRIC <14 WK ULTRASOUND TECHNIQUE: Transabdominal ultrasound was performed for evaluation of the gestation as well as the maternal uterus and adnexal regions. COMPARISON:  None for this gestation FINDINGS: Intrauterine gestational sac: Present, single Yolk sac:  Present Embryo:  Present Cardiac Activity: Present Heart Rate: 167 bpm CRL:   54.2 mm   12 w 0 d                  [place] EDC: 08/10/2018 Subchorionic hemorrhage:  Tiny subchorionic hemorrhage. Maternal uterus/adnexae: RIGHT ovary normal size and morphology, 3.0 x 2.0 x 1.3 cm. LEFT ovary normal size  and morphology, 3.4 x 1.7 x 2.2 cm. No free pelvic fluid or adnexal masses. IMPRESSION: Single live intrauterine gestation at 12 weeks 0 days EGA by crown-rump length. Tiny subchronic hemorrhage. Electronically Signed   By: Lavonia Dana M.D.   On: 01/26/2018 16:32      Domenic Moras, PA-C 01/26/18 1910    Long, Wonda [facility], MD 01/27/18 1057

## 2018-01-26 NOTE — ED Provider Notes (Signed)
[facility] DEPT Provider Note   CSN: [id number] Arrival date & time: 01/26/18  1301     History   Chief Complaint Chief Complaint  Patient presents with  . Abdominal Pain  . Back Pain    HPI Amanda [name] is a 37 y.o. female presenting for evaluation of lower abdominal pain/cramping and nausea.   Patient states she is [redacted] weeks pregnant.  She has been pregnant once before, had a miscarriage at 12 weeks.  She reports acute onset lower abdominal cramping which moves from the left to the right side.  It radiates to her back.  She reports associated nausea, states she has had frequent episodes of nausea throughout the pregnancy.  She has not taken anything for her symptoms.  She denies vaginal bleeding or discharge.  She denies fevers, chills, chest pain, shortness of breath, abnormal urination, abnormal bowel movements.  Her last apt was on May 22 with OB/GYN.    HPI  Past Medical History:  Diagnosis Date  . Anxiety   . Asthma   . Asthma due to environmental allergies   . Asthma due to seasonal allergies   . Bipolar 1 disorder ([place])   . Borderline personality disorder ([place])   . Brain bleed ([place])   . Chronic post-traumatic stress disorder (PTSD)    complex chronic with psychotic features and self harm behaviors  . Dander (animal) allergy   . Heart murmur   . Hip dysplasia   . Major depression, chronic   . Mood disorder ([place])   . OCD (obsessive compulsive disorder)   . Rheumatoid arthritis ([place])   . Seizure disorder ([place])   . Suicidal ideations   . Suicide attempt [facility])     Patient Active Problem List   Diagnosis Date Noted  . Pap smear of cervix shows high risk HPV present 01/14/2018  . Supervision of high risk pregnancy, antepartum 01/08/2018  . Seizure disorder during pregnancy ([place]) 01/08/2018  . Advanced maternal age, primigravida 01/08/2018  . Bipolar 2 disorder ([place]) 01/08/2018  . Borderline personality disorder ([place]) 01/08/2018  .  Major depression, chronic 01/08/2018  . PTSD (post-traumatic stress disorder) 01/08/2018  . OCD (obsessive compulsive disorder) 01/08/2018  . Asthma   . Seizure disorder ([place]) 11/06/2017  . Schizoaffective disorder, depressive type ([place]) 08/16/2017    Past Surgical History:  Procedure Laterality Date  . EYE SURGERY  1985  . SHUNT REMOVAL  1983  . Tooth Removal     multiple     OB History    Gravida  2   Para  0   Term  0   Preterm  0   AB  1   Living        SAB  1   TAB  0   Ectopic  0   Multiple      Live Births               Home Medications    Prior to Admission medications   Medication Sig Start Date End Date Taking? Authorizing Provider  acetaminophen (TYLENOL) 500 MG tablet Take 1,000 mg by mouth daily as needed (PAIN).   Yes [provider]  albuterol (PROVENTIL HFA;VENTOLIN HFA) 108 (90 Base) MCG/ACT inhaler Inhale 2 puffs into the lungs every 6 (six) hours as needed for wheezing or shortness of breath.   Yes [provider]  EPINEPHrine 0.3 mg/0.3 mL IJ SOAJ injection Inject 0.3 mg into the muscle once. Use as directed  Yes [provider]  folic acid (FOLVITE) 1 MG tablet Take 1 tablet (1 mg total) by mouth daily. 01/08/18  Yes Donnamae Jude, MD  levETIRAcetam (KEPPRA) 500 MG tablet Take 1 tablet (500 mg total) by mouth 2 (two) times daily. 12/04/17  Yes [name], MD  Prenatal Vit-Fe Fumarate-FA (PREPLUS) 27-1 MG TABS Take 1 tablet by mouth daily. 01/14/18  Yes [provider]  cephALEXin (KEFLEX) 250 MG capsule Take 2 capsules (500 mg total) by mouth 4 (four) times daily for 5 days. 01/26/18 01/31/18  Alyxander Kollmann, PA-C  ondansetron (ZOFRAN ODT) 4 MG disintegrating tablet Take 1 tablet (4 mg total) by mouth every 8 (eight) hours as needed for nausea or vomiting. 01/26/18   Mistie Adney, PA-C  Prenatal Multivit-Min-Fe-FA (PRENATAL VITAMINS) 0.8 MG tablet Take 1 tablet by mouth daily. Patient not  taking: Reported on 01/26/2018 01/14/18   Donnamae Jude, MD    Family History Family History  Problem Relation Age of Onset  . Heart attack Mother   . Stroke Mother   . Liver cancer Mother   . Diabetes Mother   . Lung cancer Mother   . Alcoholism Father   . Sleep apnea Brother   . Depression Brother   . ADD / ADHD Brother   . Diabetes Maternal Aunt   . Diabetes Paternal Uncle   . Diabetes Maternal Grandmother     Social History Social History   Tobacco Use  . Smoking status: Current Every Day Smoker    Packs/day: 0.25    Types: Cigarettes  . Smokeless tobacco: Never Used  Substance Use Topics  . Alcohol use: Not Currently    Frequency: Never  . Drug use: No     Allergies   Aspartame and phenylalanine; Benadryl [diphenhydramine]; Scallops [shellfish allergy]; Bee venom; Benadryl [diphenhydramine]; Other; Pollen extract; Scallops [shellfish allergy]; Tegretol [carbamazepine]; and Tegretol [carbamazepine]   Review of Systems Review of Systems  Gastrointestinal: Positive for nausea.  Genitourinary: Positive for pelvic pain (cramping). Negative for vaginal bleeding and vaginal discharge.  All other systems reviewed and are negative.    Physical Exam Updated Vital Signs BP 111/77   Pulse 72   Temp 97.8 F (36.6 C) (Oral)   Resp 18   LMP 11/07/2017   SpO2 98%   Physical Exam  Constitutional: She is oriented to person, place, and time. She appears well-developed and well-nourished. No distress.  Appears in NAD  HENT:  Head: Normocephalic and atraumatic.  Eyes: Pupils are equal, round, and reactive to light. Conjunctivae and EOM are normal.  Neck: Normal range of motion. Neck supple.  Cardiovascular: Normal rate, regular rhythm and intact distal pulses.  Pulmonary/Chest: Effort normal and breath sounds normal. No respiratory distress. She has no wheezes.  Abdominal: Soft. She exhibits no distension and no mass. There is tenderness. There is no rebound and no  guarding.  Mild TTP of lower abd, suprapubic abd. No rigidity, guarding, or distention.   Musculoskeletal: Normal range of motion.  Neurological: She is alert and oriented to person, place, and time.  Skin: Skin is warm and dry.  Psychiatric: She has a normal mood and affect.  Nursing note and vitals reviewed.    ED Treatments / Results  Labs (all labs ordered are listed, but only abnormal results are displayed) Labs Reviewed  CBC - Abnormal; Notable for the following components:      Result Value   WBC 13.6 (*)    Hemoglobin 15.2 (*)  All other components within normal limits  BASIC METABOLIC PANEL - Abnormal; Notable for the following components:   CO2 21 (*)    All other components within normal limits  HCG, QUANTITATIVE, PREGNANCY - Abnormal; Notable for the following components:   hCG, Beta Chain, Quant, [place] 106,567 (*)    All other components within normal limits  URINALYSIS, ROUTINE W REFLEX MICROSCOPIC - Abnormal; Notable for the following components:   APPearance HAZY (*)    Hgb urine dipstick SMALL (*)    Ketones, ur 5 (*)    Leukocytes, UA SMALL (*)    Bacteria, UA MANY (*)    All other components within normal limits  URINE CULTURE    EKG None  Radiology No results found.  Procedures Procedures (including critical care time)  Medications Ordered in ED Medications - No data to display   Initial Impression / Assessment and Plan / ED Course  I have reviewed the triage vital signs and the nursing notes.  Pertinent labs & imaging results that were available during my care of the patient were reviewed by me and considered in my medical decision making (see chart for details).     Patient presenting for evaluation of lower abdominal cramping.  Physical exam reassuring, she is afebrile and not tachycardic.  Mild tenderness palpation of lower abdomen.  Will obtain basic labs, quant, UA, and ultrasound.  Labs show mild leukocytosis at 13.6.  Otherwise  reassuring.  UA with many bacteria and small leuks, as patient is pregnant and high risk will treat for UTI.  Quant as expected for pregnancy.  Ultrasound ordered to ensure intrauterine pregnancy.  Pt signed out to B Rona Ravens, PA-C for f/u on [place]. PLan for d/c with abx for UTI after [place]. F/u with ob/gyn. Short course of zofran for nausea as needed.   Final Clinical Impressions(s) / ED Diagnoses   Final diagnoses:  Pregnancy related bilateral lower abdominal cramping, antepartum  Nausea and vomiting during pregnancy    ED Discharge Orders        Ordered    cephALEXin (KEFLEX) 250 MG capsule  4 times daily     01/26/18 1542    ondansetron (ZOFRAN ODT) 4 MG disintegrating tablet  Every 8 hours PRN     01/26/18 1542       Gerrett Loman, PA-C 01/26/18 1614    Jola Schmidt, MD 01/26/18 2309

## 2018-01-26 NOTE — ED Triage Notes (Signed)
Patient c/o lower back pain and cramps in lower abdomen since this morning. Reports she is [redacted] weeks pregnant. Denies vaginal bleeding. Reports nausea this morning, denies V/D. Seen at [facility] 5/22.

## 2018-01-26 NOTE — Discharge Instructions (Addendum)
Take antibiotics as prescribed.  Take the entire course, even if your symptoms improve. Use Zofran as needed for nausea or vomiting. Use a heating pad as needed for cramping pain. Follow-up with OB/GYN/women's health as needed for further pregnancy related concerns and follow-up appointments. Return to the emergency room if you develop fevers, vomiting, or any new or concerning symptoms.  Follow-up with [facility] if you develop vaginal bleeding or pregnancy related concerns.

## 2018-01-29 ENCOUNTER — Encounter: Payer: Self-pay | Admitting: Obstetrics and Gynecology

## 2018-01-29 DIAGNOSIS — O2340 Unspecified infection of urinary tract in pregnancy, unspecified trimester: Secondary | ICD-10-CM | POA: Insufficient documentation

## 2018-01-29 LAB — URINE CULTURE

## 2018-01-30 ENCOUNTER — Telehealth: Payer: Self-pay | Admitting: Emergency Medicine

## 2018-01-30 NOTE — Telephone Encounter (Signed)
Post ED Visit - Positive Culture Follow-up  Culture report reviewed by antimicrobial stewardship pharmacist:  []  Elenor Quinones, Pharm.D. []  Heide Guile, Pharm.D., BCPS AQ-ID []  Parks Neptune, Pharm.D., BCPS []  Alycia Rossetti, Pharm.D., BCPS []  [place], Pharm.D., BCPS, AAHIVP []  Legrand Como, Pharm.D., BCPS, AAHIVP []  Salome Arnt, PharmD, BCPS []  Wynell Balloon, PharmD []  Vincenza Hews, PharmD, BCPS [facility] PharmD  Positive urine culture Treated with cephalexin, organism sensitive to the same and no further patient follow-up is required at this time.  Hazle Nordmann 01/30/2018, 4:23 PM

## 2018-01-31 ENCOUNTER — Encounter (HOSPITAL_COMMUNITY): Payer: Self-pay | Admitting: Emergency Medicine

## 2018-01-31 ENCOUNTER — Emergency Department (HOSPITAL_COMMUNITY)
Admission: EM | Admit: 2018-01-31 | Discharge: 2018-02-01 | Disposition: A | Discharge status: Home / Self Care | Payer: Medicare (Managed Care) | Attending: Emergency Medicine | Admitting: Emergency Medicine

## 2018-01-31 ENCOUNTER — Other Ambulatory Visit: Payer: Self-pay

## 2018-01-31 DIAGNOSIS — O26899 Other specified pregnancy related conditions, unspecified trimester: Secondary | ICD-10-CM

## 2018-01-31 DIAGNOSIS — R1031 Right lower quadrant pain: Secondary | ICD-10-CM | POA: Insufficient documentation

## 2018-01-31 DIAGNOSIS — J45909 Unspecified asthma, uncomplicated: Secondary | ICD-10-CM | POA: Diagnosis not present

## 2018-01-31 DIAGNOSIS — F17210 Nicotine dependence, cigarettes, uncomplicated: Secondary | ICD-10-CM | POA: Diagnosis not present

## 2018-01-31 DIAGNOSIS — Z3A12 12 weeks gestation of pregnancy: Secondary | ICD-10-CM | POA: Diagnosis not present

## 2018-01-31 DIAGNOSIS — Z79899 Other long term (current) drug therapy: Secondary | ICD-10-CM | POA: Diagnosis not present

## 2018-01-31 DIAGNOSIS — O26891 Other specified pregnancy related conditions, first trimester: Secondary | ICD-10-CM | POA: Diagnosis not present

## 2018-01-31 DIAGNOSIS — R109 Unspecified abdominal pain: Secondary | ICD-10-CM

## 2018-01-31 NOTE — ED Triage Notes (Addendum)
C/o sharp lower back pain and sharp RLQ pain since Sunday.  States she is [redacted] weeks pregnant.  Seen at [facility] for same on Sunday but feels like they only checked baby and did not evaluate the back pain and RLQ pain. C/o nausea.  States she is currently taking antibiotics for UTI.  Last BM a few weeks ago and was a small amount.

## 2018-02-01 LAB — COMPREHENSIVE METABOLIC PANEL
ALBUMIN: 3.5 g/dL (ref 3.5–5.0)
ALT: 13 U/L — ABNORMAL LOW (ref 14–54)
ANION GAP: 10 (ref 5–15)
AST: 14 U/L — ABNORMAL LOW (ref 15–41)
Alkaline Phosphatase: 75 U/L (ref 38–126)
BUN: 7 mg/dL (ref 6–20)
CO2: 23 mmol/L (ref 22–32)
Calcium: 9.4 mg/dL (ref 8.9–10.3)
Chloride: 103 mmol/L (ref 101–111)
Creatinine, Ser: 0.59 mg/dL (ref 0.44–1.00)
GFR calc non Af Amer: >60 mL/min (ref >60)
GLUCOSE: 96 mg/dL (ref 65–99)
POTASSIUM: 3.8 mmol/L (ref 3.5–5.1)
SODIUM: 136 mmol/L (ref 135–145)
TOTAL PROTEIN: 6.5 g/dL (ref 6.5–8.1)
Total Bilirubin: 0.4 mg/dL (ref 0.3–1.2)

## 2018-02-01 LAB — URINALYSIS, ROUTINE W REFLEX MICROSCOPIC
Bilirubin Urine: NEGATIVE
GLUCOSE, UA: NEGATIVE mg/dL
Ketones, ur: NEGATIVE mg/dL
LEUKOCYTES UA: NEGATIVE
NITRITE: NEGATIVE
Protein, ur: NEGATIVE mg/dL
SPECIFIC GRAVITY, URINE: 1.016 (ref 1.005–1.030)
pH: 5 (ref 5.0–8.0)

## 2018-02-01 LAB — GLUCOSE, POCT (MANUAL RESULT ENTRY): POC Glucose: 92 mg/dl (ref 70–99)

## 2018-02-01 LAB — CBC
HEMATOCRIT: 40.7 % (ref 36.0–46.0)
HEMOGLOBIN: 13.8 g/dL (ref 12.0–15.0)
MCH: 30.2 pg (ref 26.0–34.0)
MCHC: 33.9 g/dL (ref 30.0–36.0)
MCV: 89.1 fL (ref 78.0–100.0)
Platelets: 304 10*3/uL (ref 150–400)
RBC: 4.57 MIL/uL (ref 3.87–5.11)
RDW: 13.2 % (ref 11.5–15.5)
WBC: 15.7 10*3/uL — ABNORMAL HIGH (ref 4.0–10.5)

## 2018-02-01 LAB — LIPASE, BLOOD: Lipase: 27 U/L (ref 11–51)

## 2018-02-01 MED ORDER — SODIUM CHLORIDE 0.9 % IV BOLUS
1000.0000 mL | Freq: Once | INTRAVENOUS | Status: AC
  Administered 2018-02-01: 1000 mL via INTRAVENOUS

## 2018-02-01 NOTE — ED Provider Notes (Signed)
[facility] EMERGENCY DEPARTMENT Provider Note   CSN: [id number] Arrival date & time: 01/31/18  2246     History   Chief Complaint Chief Complaint  Patient presents with  . Abdominal Pain    [redacted] weeks pregnant  . Back Pain    HPI Amanda [name] is a 37 y.o. female who is currently [redacted] weeks pregnant who presents for evaluation of persistent right lower quadrant abdominal pain.  Patient reports is been going on for the last 6 days.  Patient was seen at [facility] emergency department on 01/26/2018 for evaluation of same symptoms.  At that time, her work-up showed a slight leukocytosis and UTI.  Low suspicion for appendicitis at the time.  Patient was discharged on antibiotics and told to monitor symptoms and return if she had any worsening pain.  Patient comes into the emergency room today because she reports she is continued to have right lower quadrant abdominal pain.  She describes it as a sharp pain.  She reports she has had some vomiting, but does not know if that is due from her pain or from normal pregnancy.  Denies any fevers.  Patient also reporting some lower back pain.  She states she has a history of lower back pain secondary to injuries as a child.  Patient reports she has not had any new trauma, injury, fall.  Patient denies any saddle anesthesia, urinary or bowel incontinence.  Patient reports she has not taken anything for the pain.  Patient reports she has not taken any Tylenol because she knows it will not work she does not even bother with it.  Patient denies any fevers, chest pain, difficulty breathing, dysuria, hematuria.  The history is provided by the patient.    Past Medical History:  Diagnosis Date  . Anxiety   . Asthma   . Asthma due to environmental allergies   . Asthma due to seasonal allergies   . Bipolar 1 disorder ([place])   . Borderline personality disorder ([place])   . Brain bleed ([place])   . Chronic post-traumatic stress disorder (PTSD)    complex chronic with psychotic features and self harm behaviors  . Dander (animal) allergy   . Heart murmur   . Hip dysplasia   . Major depression, chronic   . Mood disorder ([place])   . OCD (obsessive compulsive disorder)   . Rheumatoid arthritis ([place])   . Seizure disorder ([place])   . Suicidal ideations   . Suicide attempt [facility])     Patient Active Problem List   Diagnosis Date Noted  . UTI in pregnancy 01/29/2018  . Pap smear of cervix shows high risk HPV present 01/14/2018  . Supervision of high risk pregnancy, antepartum 01/08/2018  . Seizure disorder during pregnancy ([place]) 01/08/2018  . Advanced maternal age, primigravida 01/08/2018  . Bipolar 2 disorder ([place]) 01/08/2018  . Borderline personality disorder ([place]) 01/08/2018  . Major depression, chronic 01/08/2018  . PTSD (post-traumatic stress disorder) 01/08/2018  . OCD (obsessive compulsive disorder) 01/08/2018  . Asthma   . Seizure disorder ([place]) 11/06/2017  . Schizoaffective disorder, depressive type ([place]) 08/16/2017    Past Surgical History:  Procedure Laterality Date  . EYE SURGERY  1985  . SHUNT REMOVAL  1983  . Tooth Removal     multiple     OB History    Gravida  2   Para  0   Term  0   Preterm  0   AB  1  Living        SAB  1   TAB  0   Ectopic  0   Multiple      Live Births               Home Medications    Prior to Admission medications   Medication Sig Start Date End Date Taking? Authorizing Provider  albuterol (PROVENTIL HFA;VENTOLIN HFA) 108 (90 Base) MCG/ACT inhaler Inhale 2 puffs into the lungs every 6 (six) hours as needed for wheezing or shortness of breath.   Yes [provider]  cephALEXin (KEFLEX) 250 MG capsule Take 2 capsules (500 mg total) by mouth 4 (four) times daily for 5 days. 01/26/18 02/01/26 Yes Caccavale, Sophia, PA-C  EPINEPHrine 0.3 mg/0.3 mL IJ SOAJ injection Inject 0.3 mg into the muscle as needed (for allergic reaction).    Yes [provider]   folic acid (FOLVITE) 1 MG tablet Take 1 tablet (1 mg total) by mouth daily. 01/08/18  Yes Donnamae Jude, MD  levETIRAcetam (KEPPRA) 500 MG tablet Take 1 tablet (500 mg total) by mouth 2 (two) times daily. 12/04/17  Yes [name], MD  ondansetron (ZOFRAN ODT) 4 MG disintegrating tablet Take 1 tablet (4 mg total) by mouth every 8 (eight) hours as needed for nausea or vomiting. 01/26/18  Yes Caccavale, Sophia, PA-C  Prenatal Vit-Fe Fumarate-FA (PREPLUS) 27-1 MG TABS Take 1 tablet by mouth daily. 01/14/18  Yes [provider]  Prenatal Multivit-Min-Fe-FA (PRENATAL VITAMINS) 0.8 MG tablet Take 1 tablet by mouth daily. Patient not taking: Reported on 01/26/2018 01/14/18   Donnamae Jude, MD    Family History Family History  Problem Relation Age of Onset  . Heart attack Mother   . Stroke Mother   . Liver cancer Mother   . Diabetes Mother   . Lung cancer Mother   . Alcoholism Father   . Sleep apnea Brother   . Depression Brother   . ADD / ADHD Brother   . Diabetes Maternal Aunt   . Diabetes Paternal Uncle   . Diabetes Maternal Grandmother     Social History Social History   Tobacco Use  . Smoking status: Current Every Day Smoker    Packs/day: 0.25    Types: Cigarettes  . Smokeless tobacco: Never Used  Substance Use Topics  . Alcohol use: Not Currently    Frequency: Never  . Drug use: No     Allergies   Aspartame and phenylalanine; Benadryl [diphenhydramine]; Scallops [shellfish allergy]; Bee venom; Benadryl [diphenhydramine]; Other; Pollen extract; Scallops [shellfish allergy]; Tegretol [carbamazepine]; and Tegretol [carbamazepine]   Review of Systems Review of Systems  Constitutional: Negative for chills and fever.  HENT: Negative for congestion.   Eyes: Negative for visual disturbance.  Respiratory: Negative for cough and shortness of breath.   Cardiovascular: Negative for chest pain.  Gastrointestinal: Positive for abdominal pain and vomiting. Negative for  diarrhea and nausea.  Genitourinary: Negative for dysuria, hematuria and vaginal bleeding.  Musculoskeletal: Positive for back pain. Negative for neck pain.  Skin: Negative for rash.  Neurological: Negative for dizziness, weakness, numbness and headaches.  All other systems reviewed and are negative.    Physical Exam Updated Vital Signs BP 98/63   Pulse 60   Temp 98.2 F (36.8 C) (Oral)   Resp 17   Ht 5\' 8"  (1.727 m)   Wt 93.4 kg (206 lb)   LMP 11/07/2017   SpO2 100%   BMI 31.32 kg/m  Physical Exam  Constitutional: She is oriented to person, place, and time. She appears well-developed and well-nourished.  HENT:  Head: Normocephalic and atraumatic.  Mouth/Throat: Oropharynx is clear and moist and mucous membranes are normal.  Eyes: Pupils are equal, round, and reactive to light. Conjunctivae, EOM and lids are normal.  Neck: Full passive range of motion without pain.  Full flexion/extension and lateral movement of neck fully intact. No bony midline tenderness. No deformities or crepitus.   Cardiovascular: Normal rate, regular rhythm, normal heart sounds and normal pulses. Exam reveals no gallop and no friction rub.  No murmur heard. Pulmonary/Chest: Effort normal and breath sounds normal.  Lungs clear to auscultation bilaterally.  Symmetric chest rise.  No wheezing, rales, rhonchi.  Abdominal: Soft. Normal appearance. There is tenderness in the right lower quadrant. There is no rigidity, no guarding and no CVA tenderness.  Abdomen is soft, non-distended. Minimal tenderness to palpation noted to the RLQ is not evident when patient is distracted. No CVA tenderness bilaterally.   Musculoskeletal: Normal range of motion.       Thoracic back: She exhibits no tenderness.       Back:  Neurological: She is alert and oriented to person, place, and time.  Skin: Skin is warm and dry. Capillary refill takes less than 2 seconds.  Psychiatric: She has a normal mood and affect. Her speech  is normal.  Nursing note and vitals reviewed.    ED Treatments / Results  Labs (all labs ordered are listed, but only abnormal results are displayed) Labs Reviewed  COMPREHENSIVE METABOLIC PANEL - Abnormal; Notable for the following components:      Result Value   AST 14 (*)    ALT 13 (*)    All other components within normal limits  CBC - Abnormal; Notable for the following components:   WBC 15.7 (*)    All other components within normal limits  URINALYSIS, ROUTINE W REFLEX MICROSCOPIC - Abnormal; Notable for the following components:   Hgb urine dipstick SMALL (*)    Bacteria, UA RARE (*)    All other components within normal limits  LIPASE, BLOOD    EKG None  Radiology No results found.  Procedures Procedures (including critical care time)  Medications Ordered in ED Medications  sodium chloride 0.9 % bolus 1,000 mL (1,000 mLs Intravenous New Bag/Given 02/01/18 0735)     Initial Impression / Assessment and Plan / ED Course  I have reviewed the triage vital signs and the nursing notes.  Pertinent labs & imaging results that were available during my care of the patient were reviewed by me and considered in my medical decision making (see chart for details).     37 year old female who is currently [redacted] weeks pregnant who presents for evaluation of persistent right lower quadrant abdominal pain.  Seen at [facility] on six 9/19 for evaluation of same symptoms.  Had of UTI.  Low suspicion for appendectomy at that time.  Discharged home closely told to closely monitor symptoms.  Return today for worsening pain.  Reports associated vomiting.  No fevers, chest pain, difficulty breathing.  Reports lower back pain.  Patient is afebrile, non-toxic appearing, sitting comfortably on examination table. Vital signs reviewed and stable.  Abdomen soft, nondistended.  Patient does exhibit some very minimal tenderness in the right lower quadrant. When patient is distracted with talking,  tenderness resolves  Also tenderness noted to the lower lumbar region.  No deformities or crepitus noted.  No neuro deficits  on exam. Initial labs ordered at triage.   CBC shows leukocytosis of 15.7. CBC 6 days ago, showed leukocytosis 13.6.  Otherwise unremarkable.  CMP unremarkable.  Lipase unremarkable.  UA small hemoglobin, rare  Bacteria. Overall improved since last visit.   Discussed patient with Dr. Ashok Cordia after independent evaluation of patient.  Patient's exam is not concerning for appendicitis at this time.  While she does exhibit some very minimal tenderness in the right lower quadrant, with distractibility, the tenderness resolved completely.  Additionally, patient is not any fevers and has been able to tolerate p.o. without any difficulty.  She exhibits no CVA tenderness that would be concerning for pyelonephritis.  Her back pain is in the lower part of her back and is something that she chronically experiences.  No new injury, trauma, fall that would indicate need for further imaging.  Suspect that this is chronic in nature.  No indication for further evaluation here in ED.  Discussed results with patient.  Instructed her follow-up with her OB/GYN for further evaluation.  Instructed patient to closely monitor symptoms return to emergency department for any fevers, persistent vomiting, inability to eat or drink. Patient had ample opportunity for questions and discussion. All patient's questions were answered with full understanding. Strict return precautions discussed. Patient expresses understanding and agreement to plan.   Final Clinical Impressions(s) / ED Diagnoses   Final diagnoses:  Abdominal pain during pregnancy, antepartum    ED Discharge Orders    None       Volanda Napoleon, PA-C 02/01/18 1304    Lajean Saver, MD 02/01/18 1441

## 2018-02-01 NOTE — Discharge Instructions (Signed)
You can take 1000 mg of Tylenol.  Do not exceed 4000 mg of Tylenol a day.  Follow-up with your OB/GYN for further evaluation.  As we discussed, return to emergency room for any fever, inability to eat or drink anything, persistent vomiting, worsening pain, vaginal bleeding or any other worsening or concerning symptoms.

## 2018-02-06 ENCOUNTER — Encounter: Payer: Self-pay | Admitting: Student

## 2018-02-06 ENCOUNTER — Ambulatory Visit: Payer: Self-pay | Admitting: Clinical

## 2018-02-06 ENCOUNTER — Ambulatory Visit (INDEPENDENT_AMBULATORY_CARE_PROVIDER_SITE_OTHER): Payer: Self-pay | Admitting: Clinical

## 2018-02-06 ENCOUNTER — Ambulatory Visit (INDEPENDENT_AMBULATORY_CARE_PROVIDER_SITE_OTHER): Payer: Medicare (Managed Care) | Admitting: Family Medicine

## 2018-02-06 VITALS — BP 132/65 | HR 72 | Wt 206.0 lb

## 2018-02-06 DIAGNOSIS — F259 Schizoaffective disorder, unspecified: Secondary | ICD-10-CM

## 2018-02-06 DIAGNOSIS — O09512 Supervision of elderly primigravida, second trimester: Secondary | ICD-10-CM

## 2018-02-06 DIAGNOSIS — O0990 Supervision of high risk pregnancy, unspecified, unspecified trimester: Secondary | ICD-10-CM

## 2018-02-06 DIAGNOSIS — F603 Borderline personality disorder: Secondary | ICD-10-CM

## 2018-02-06 DIAGNOSIS — F3181 Bipolar II disorder: Secondary | ICD-10-CM

## 2018-02-06 DIAGNOSIS — F5101 Primary insomnia: Secondary | ICD-10-CM

## 2018-02-06 DIAGNOSIS — G40909 Epilepsy, unspecified, not intractable, without status epilepticus: Secondary | ICD-10-CM

## 2018-02-06 DIAGNOSIS — O99352 Diseases of the nervous system complicating pregnancy, second trimester: Secondary | ICD-10-CM

## 2018-02-06 DIAGNOSIS — F4310 Post-traumatic stress disorder, unspecified: Secondary | ICD-10-CM

## 2018-02-06 DIAGNOSIS — Z658 Other specified problems related to psychosocial circumstances: Secondary | ICD-10-CM

## 2018-02-06 MED ORDER — HYDROXYZINE PAMOATE 50 MG PO CAPS: 50.0000 mg | ORAL_CAPSULE | Freq: Every day | ORAL | 2 refills | Status: DC

## 2018-02-06 NOTE — Progress Notes (Signed)
   PRENATAL VISIT NOTE  Subjective:  Amanda [name] is a 37 y.o. G2P0010 at [redacted]w[redacted]d being seen today for ongoing prenatal care.  She is currently monitored for the following issues for this high-risk pregnancy and has Schizoaffective disorder, depressive type ([place]); Seizure disorder ([place]); Supervision of high risk pregnancy, antepartum; Seizure disorder during pregnancy ([place]); Advanced maternal age, primigravida; Bipolar 2 disorder ([place]); Borderline personality disorder ([place]); Major depression, chronic; PTSD (post-traumatic stress disorder); OCD (obsessive compulsive disorder); Asthma; Pap smear of cervix shows high risk HPV present; and UTI in pregnancy on their problem list.  Patient reports no complaints. She has been eating salads to increase her fiber to reduce risks of constipation. Contractions: Not present. Vag. Bleeding: None.  Movement: Absent. Denies leaking of fluid.   The following portions of the patient's history were reviewed and updated as appropriate: allergies, current medications, past family history, past medical history, past social history, past surgical history and problem list. Problem list updated.  Objective:   Vitals:   02/06/18 1124  BP: 132/65  Pulse: 72  Weight: 206 lb (93.4 kg)    Fetal Status: Fetal Heart Rate (bpm): 155   Movement: Absent     General:  Alert, oriented and cooperative. Patient is in no acute distress.  Skin: Skin is warm and dry. No rash noted.   Cardiovascular: Normal heart rate noted  Respiratory: Normal respiratory effort, no problems with respiration noted  Abdomen: Soft, gravid, appropriate for gestational age.  Pain/Pressure: Present     Pelvic: Cervical exam deferred        Extremities: Normal range of motion.  Edema: Trace  Mental Status: Normal mood and affect. Normal behavior. Normal judgment and thought content.   Assessment and Plan:  Pregnancy: G2P0010 at [redacted]w[redacted]d  1. Seizure disorder during pregnancy in second trimester  [facility]) Continue with current medications  2. Supervision of high risk pregnancy, antepartum   3. Primigravida of advanced maternal age in second trimester NIPT - Genetic Screening  4. Mulitple psych diagnosis  Some auditory hallucinations if not sleeping. Consult with Dr. Wandra Mannan recommends Vistaril.  Preterm labor symptoms and general obstetric precautions including but not limited to vaginal bleeding, contractions, leaking of fluid and fetal movement were reviewed in detail with the patient. Please refer to After Visit Summary for other counseling recommendations.  Return in 1 month (on 03/06/2018).  Future Appointments  Date Time Provider [place]  03/06/2018  1:35 PM Constant, Vickii Chafe, MD [place], RN, FNP (student)

## 2018-02-06 NOTE — Patient Instructions (Signed)
 Contraception Choices Contraception, also called birth control, refers to methods or devices that prevent pregnancy. Hormonal methods Contraceptive implant A contraceptive implant is a thin, plastic tube that contains a hormone. It is inserted into the upper part of the arm. It can remain in place for up to 3 years. Progestin-only injections Progestin-only injections are injections of progestin, a synthetic form of the hormone progesterone. They are given every 3 months by a health care provider. Birth control pills Birth control pills are pills that contain hormones that prevent pregnancy. They must be taken once a day, preferably at the same time each day. Birth control patch The birth control patch contains hormones that prevent pregnancy. It is placed on the skin and must be changed once a week for three weeks and removed on the fourth week. A prescription is needed to use this method of contraception. Vaginal ring A vaginal ring contains hormones that prevent pregnancy. It is placed in the vagina for three weeks and removed on the fourth week. After that, the process is repeated with a new ring. A prescription is needed to use this method of contraception. Emergency contraceptive Emergency contraceptives prevent pregnancy after unprotected sex. They come in pill form and can be taken up to 5 days after sex. They work best the sooner they are taken after having sex. Most emergency contraceptives are available without a prescription. This method should not be used as your only form of birth control. Barrier methods Female condom A female condom is a thin sheath that is worn over the penis during sex. Condoms keep sperm from going inside a woman's body. They can be used with a spermicide to increase their effectiveness. They should be disposed after a single use. Female condom A female condom is a soft, loose-fitting sheath that is put into the vagina before sex. The condom keeps sperm from  going inside a woman's body. They should be disposed after a single use. Diaphragm A diaphragm is a soft, dome-shaped barrier. It is inserted into the vagina before sex, along with a spermicide. The diaphragm blocks sperm from entering the uterus, and the spermicide kills sperm. A diaphragm should be left in the vagina for 6-8 hours after sex and removed within 24 hours. A diaphragm is prescribed and fitted by a health care provider. A diaphragm should be replaced every 1-2 years, after giving birth, after gaining more than 15 lb (6.8 kg), and after pelvic surgery. Cervical cap A cervical cap is a round, soft latex or plastic cup that fits over the cervix. It is inserted into the vagina before sex, along with spermicide. It blocks sperm from entering the uterus. The cap should be left in place for 6-8 hours after sex and removed within 48 hours. A cervical cap must be prescribed and fitted by a health care provider. It should be replaced every 2 years. Sponge A sponge is a soft, circular piece of polyurethane foam with spermicide on it. The sponge helps block sperm from entering the uterus, and the spermicide kills sperm. To use it, you make it wet and then insert it into the vagina. It should be inserted before sex, left in for at least 6 hours after sex, and removed and thrown away within 30 hours. Spermicides Spermicides are chemicals that kill or block sperm from entering the cervix and uterus. They can come as a cream, jelly, suppository, foam, or tablet. A spermicide should be inserted into the vagina with an applicator at least 10-15 minutes   before sex to allow time for it to work. The process must be repeated every time you have sex. Spermicides do not require a prescription. Intrauterine contraception Intrauterine device (IUD) An IUD is a T-shaped device that is put in a woman's uterus. There are two types:  Hormone IUD.This type contains progestin, a synthetic form of the hormone  progesterone. This type can stay in place for 3-5 years.  Copper IUD.This type is wrapped in copper wire. It can stay in place for 10 years.  Permanent methods of contraception Female tubal ligation In this method, a woman's fallopian tubes are sealed, tied, or blocked during surgery to prevent eggs from traveling to the uterus. Hysteroscopic sterilization In this method, a small, flexible insert is placed into each fallopian tube. The inserts cause scar tissue to form in the fallopian tubes and block them, so sperm cannot reach an egg. The procedure takes about 3 months to be effective. Another form of birth control must be used during those 3 months. Female sterilization This is a procedure to tie off the tubes that carry sperm (vasectomy). After the procedure, the man can still ejaculate fluid (semen). Natural planning methods Natural family planning In this method, a couple does not have sex on days when the woman could become pregnant. Calendar method This means keeping track of the length of each menstrual cycle, identifying the days when pregnancy can happen, and not having sex on those days. Ovulation method In this method, a couple avoids sex during ovulation. Symptothermal method This method involves not having sex during ovulation. The woman typically checks for ovulation by watching changes in her temperature and in the consistency of cervical mucus. Post-ovulation method In this method, a couple waits to have sex until after ovulation. Summary  Contraception, also called birth control, means methods or devices that prevent pregnancy.  Hormonal methods of contraception include implants, injections, pills, patches, vaginal rings, and emergency contraceptives.  Barrier methods of contraception can include female condoms, female condoms, diaphragms, cervical caps, sponges, and spermicides.  There are two types of IUDs (intrauterine devices). An IUD can be put in a woman's uterus to  prevent pregnancy for 3-5 years.  Permanent sterilization can be done through a procedure for males, females, or both.  Natural family planning methods involve not having sex on days when the woman could become pregnant. This information is not intended to replace advice given to you by your health care provider. Make sure you discuss any questions you have with your health care provider. Document Released: 08/06/2005 Document Revised: 09/08/2016 Document Reviewed: 09/08/2016 Elsevier Interactive Patient Education  2018 [employer].   Breastfeeding Choosing to breastfeed is one of the best decisions you can make for yourself and your baby. A change in hormones during pregnancy causes your breasts to make breast milk in your milk-producing glands. Hormones prevent breast milk from being released before your baby is born. They also prompt milk flow after birth. Once breastfeeding has begun, thoughts of your baby, as well as his or her sucking or crying, can stimulate the release of milk from your milk-producing glands. Benefits of breastfeeding Research shows that breastfeeding offers many health benefits for infants and mothers. It also offers a cost-free and convenient way to feed your baby. For your baby  Your first milk (colostrum) helps your baby's digestive system to function better.  Special cells in your milk (antibodies) help your baby to fight off infections.  Breastfed babies are less likely to develop asthma, allergies,  obesity, or type 2 diabetes. They are also at lower risk for sudden infant death syndrome (SIDS).  Nutrients in breast milk are better able to meet your baby's needs compared to infant formula.  Breast milk improves your baby's brain development. For you  Breastfeeding helps to create a very special bond between you and your baby.  Breastfeeding is convenient. Breast milk costs nothing and is always available at the correct temperature.  Breastfeeding helps  to burn calories. It helps you to lose the weight that you gained during pregnancy.  Breastfeeding makes your uterus return faster to its size before pregnancy. It also slows bleeding (lochia) after you give birth.  Breastfeeding helps to lower your risk of developing type 2 diabetes, osteoporosis, rheumatoid arthritis, cardiovascular disease, and breast, ovarian, uterine, and endometrial cancer later in life. Breastfeeding basics Starting breastfeeding  Find a comfortable place to sit or lie down, with your neck and back well-supported.  Place a pillow or a rolled-up blanket under your baby to bring him or her to the level of your breast (if you are seated). Nursing pillows are specially designed to help support your arms and your baby while you breastfeed.  Make sure that your baby's tummy (abdomen) is facing your abdomen.  Gently massage your breast. With your fingertips, massage from the outer edges of your breast inward toward the nipple. This encourages milk flow. If your milk flows slowly, you may need to continue this action during the feeding.  Support your breast with 4 fingers underneath and your thumb above your nipple (make the letter "C" with your hand). Make sure your fingers are well away from your nipple and your baby's mouth.  Stroke your baby's lips gently with your finger or nipple.  When your baby's mouth is open wide enough, quickly bring your baby to your breast, placing your entire nipple and as much of the areola as possible into your baby's mouth. The areola is the colored area around your nipple. ? More areola should be visible above your baby's upper lip than below the lower lip. ? Your baby's lips should be opened and extended outward (flanged) to ensure an adequate, comfortable latch. ? Your baby's tongue should be between his or her lower gum and your breast.  Make sure that your baby's mouth is correctly positioned around your nipple (latched). Your baby's  lips should create a seal on your breast and be turned out (everted).  It is common for your baby to suck about 2-3 minutes in order to start the flow of breast milk. Latching Teaching your baby how to latch onto your breast properly is very important. An improper latch can cause nipple pain, decreased milk supply, and poor weight gain in your baby. Also, if your baby is not latched onto your nipple properly, he or she may swallow some air during feeding. This can make your baby fussy. Burping your baby when you switch breasts during the feeding can help to get rid of the air. However, teaching your baby to latch on properly is still the best way to prevent fussiness from swallowing air while breastfeeding. Signs that your baby has successfully latched onto your nipple  Silent tugging or silent sucking, without causing you pain. Infant's lips should be extended outward (flanged).  Swallowing heard between every 3-4 sucks once your milk has started to flow (after your let-down milk reflex occurs).  Muscle movement above and in front of his or her ears while sucking.  Signs that  your baby has not successfully latched onto your nipple  Sucking sounds or smacking sounds from your baby while breastfeeding.  Nipple pain.  If you think your baby has not latched on correctly, slip your finger into the corner of your baby's mouth to break the suction and place it between your baby's gums. Attempt to start breastfeeding again. Signs of successful breastfeeding Signs from your baby  Your baby will gradually decrease the number of sucks or will completely stop sucking.  Your baby will fall asleep.  Your baby's body will relax.  Your baby will retain a small amount of milk in his or her mouth.  Your baby will let go of your breast by himself or herself.  Signs from you  Breasts that have increased in firmness, weight, and size 1-3 hours after feeding.  Breasts that are softer immediately  after breastfeeding.  Increased milk volume, as well as a change in milk consistency and color by the fifth day of breastfeeding.  Nipples that are not sore, cracked, or bleeding.  Signs that your baby is getting enough milk  Wetting at least 1-2 diapers during the first 24 hours after birth.  Wetting at least 5-6 diapers every 24 hours for the first week after birth. The urine should be clear or pale yellow by the age of 5 days.  Wetting 6-8 diapers every 24 hours as your baby continues to grow and develop.  At least 3 stools in a 24-hour period by the age of 5 days. The stool should be soft and yellow.  At least 3 stools in a 24-hour period by the age of 7 days. The stool should be seedy and yellow.  No loss of weight greater than 10% of birth weight during the first 3 days of life.  Average weight gain of 4-7 oz (113-198 g) per week after the age of 4 days.  Consistent daily weight gain by the age of 5 days, without weight loss after the age of 2 weeks. After a feeding, your baby may spit up a small amount of milk. This is normal. Breastfeeding frequency and duration Frequent feeding will help you make more milk and can prevent sore nipples and extremely full breasts (breast engorgement). Breastfeed when you feel the need to reduce the fullness of your breasts or when your baby shows signs of hunger. This is called "breastfeeding on demand." Signs that your baby is hungry include:  Increased alertness, activity, or restlessness.  Movement of the head from side to side.  Opening of the mouth when the corner of the mouth or cheek is stroked (rooting).  Increased sucking sounds, smacking lips, cooing, sighing, or squeaking.  Hand-to-mouth movements and sucking on fingers or hands.  Fussing or crying.  Avoid introducing a pacifier to your baby in the first 4-6 weeks after your baby is born. After this time, you may choose to use a pacifier. Research has shown that pacifier use  during the first year of a baby's life decreases the risk of sudden infant death syndrome (SIDS). Allow your baby to feed on each breast as long as he or she wants. When your baby unlatches or falls asleep while feeding from the first breast, offer the second breast. Because newborns are often sleepy in the first few weeks of life, you may need to awaken your baby to get him or her to feed. Breastfeeding times will vary from baby to baby. However, the following rules can serve as a guide to help  you make sure that your baby is properly fed:  Newborns (babies 15 weeks of age or younger) may breastfeed every 1-3 hours.  Newborns should not go without breastfeeding for longer than 3 hours during the day or 5 hours during the night.  You should breastfeed your baby a minimum of 8 times in a 24-hour period.  Breast milk pumping Pumping and storing breast milk allows you to make sure that your baby is exclusively fed your breast milk, even at times when you are unable to breastfeed. This is especially important if you go back to work while you are still breastfeeding, or if you are not able to be present during feedings. Your lactation consultant can help you find a method of pumping that works best for you and give you guidelines about how long it is safe to store breast milk. Caring for your breasts while you breastfeed Nipples can become dry, cracked, and sore while breastfeeding. The following recommendations can help keep your breasts moisturized and healthy:  Avoid using soap on your nipples.  Wear a supportive bra designed especially for nursing. Avoid wearing underwire-style bras or extremely tight bras (sports bras).  Air-dry your nipples for 3-4 minutes after each feeding.  Use only cotton bra pads to absorb leaked breast milk. Leaking of breast milk between feedings is normal.  Use lanolin on your nipples after breastfeeding. Lanolin helps to maintain your skin's normal moisture barrier.  Pure lanolin is not harmful (not toxic) to your baby. You may also hand express a few drops of breast milk and gently massage that milk into your nipples and allow the milk to air-dry.  In the first few weeks after giving birth, some women experience breast engorgement. Engorgement can make your breasts feel heavy, warm, and tender to the touch. Engorgement peaks within 3-5 days after you give birth. The following recommendations can help to ease engorgement:  Completely empty your breasts while breastfeeding or pumping. You may want to start by applying warm, moist heat (in the shower or with warm, water-soaked hand towels) just before feeding or pumping. This increases circulation and helps the milk flow. If your baby does not completely empty your breasts while breastfeeding, pump any extra milk after he or she is finished.  Apply ice packs to your breasts immediately after breastfeeding or pumping, unless this is too uncomfortable for you. To do this: ? Put ice in a plastic bag. ? Place a towel between your skin and the bag. ? Leave the ice on for 20 minutes, 2-3 times a day.  Make sure that your baby is latched on and positioned properly while breastfeeding.  If engorgement persists after 48 hours of following these recommendations, contact your health care provider or a Science writer. Overall health care recommendations while breastfeeding  Eat 3 healthy meals and 3 snacks every day. Well-nourished mothers who are breastfeeding need an additional 450-500 calories a day. You can meet this requirement by increasing the amount of a balanced diet that you eat.  Drink enough water to keep your urine pale yellow or clear.  Rest often, relax, and continue to take your prenatal vitamins to prevent fatigue, stress, and low vitamin and mineral levels in your body (nutrient deficiencies).  Do not use any products that contain nicotine or tobacco, such as cigarettes and e-cigarettes. Your baby  may be harmed by chemicals from cigarettes that pass into breast milk and exposure to secondhand smoke. If you need help quitting, ask your health care  provider.  Avoid alcohol.  Do not use illegal drugs or marijuana.  Talk with your health care provider before taking any medicines. These include over-the-counter and prescription medicines as well as vitamins and herbal supplements. Some medicines that may be harmful to your baby can pass through breast milk.  It is possible to become pregnant while breastfeeding. If birth control is desired, ask your health care provider about options that will be safe while breastfeeding your baby. Where to find more information: [employer]: www.llli.org Contact a health care provider if:  You feel like you want to stop breastfeeding or have become frustrated with breastfeeding.  Your nipples are cracked or bleeding.  Your breasts are red, tender, or warm.  You have: ? Painful breasts or nipples. ? A swollen area on either breast. ? A fever or chills. ? Nausea or vomiting. ? Drainage other than breast milk from your nipples.  Your breasts do not become full before feedings by the fifth day after you give birth.  You feel sad and depressed.  Your baby is: ? Too sleepy to eat well. ? Having trouble sleeping. ? More than 60 week old and wetting fewer than 6 diapers in a 24-hour period. ? Not gaining weight by 43 days of age.  Your baby has fewer than 3 stools in a 24-hour period.  Your baby's skin or the white parts of his or her eyes become yellow. Get help right away if:  Your baby is overly tired (lethargic) and does not want to wake up and feed.  Your baby develops an unexplained fever. Summary  Breastfeeding offers many health benefits for infant and mothers.  Try to breastfeed your infant when he or she shows early signs of hunger.  Gently tickle or stroke your baby's lips with your finger or nipple to allow the  baby to open his or her mouth. Bring the baby to your breast. Make sure that much of the areola is in your baby's mouth. Offer one side and burp the baby before you offer the other side.  Talk with your health care provider or lactation consultant if you have questions or you face problems as you breastfeed. This information is not intended to replace advice given to you by your health care provider. Make sure you discuss any questions you have with your health care provider. Document Released: 08/06/2005 Document Revised: 09/07/2016 Document Reviewed: 09/07/2016 Elsevier Interactive Patient Education  [employer].

## 2018-02-06 NOTE — BH Specialist Note (Signed)
Integrated Behavioral Health Initial Visit  MRN: [id number] Name: Amanda [name]  Number of [place] Clinician visits:: 1/6 Session Start time: 10:30  Session End time: 11:00 Total time: 45 minutes  Type of Service: [place] Interpretor:No. Interpretor Name and Language: n/a   Warm Hand Off Completed.       SUBJECTIVE: Amanda [name] is a 37 y.o. female accompanied by fiance @ last 10 minutes of visit Patient was referred by Dr Kennon Rounds for [facility] issues. 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 Pt has auditory hallucinations when she is either sleep-deprived and/or when under a great deal of stress; does not have AH daily. Pt is able to sleep 2-3 hours at a time, day and night; primary concern is finding stable housing prior to birth of baby.  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 Reduce symptoms of: anxiety, mood instability and stress 2. Increase knowledge and/or ability of: stress reduction  3. Demonstrate ability to: Increase healthy adjustment to current life circumstances and Increase adequate support systems for patient/family  INTERVENTIONS: Interventions utilized: Medication Monitoring, Sleep Hygiene, Psychoeducation and/or Health Education and Link to [employer]  Standardized Assessments completed: GAD-7 and PHQ 9  ASSESSMENT: Patient currently experiencing Bipolar 2 disorder, Borderline personality disorder, Chonic post-traumatic stress disorder, and Schizoaffective disorder,depressive type, Per previous psychiatry diagnosis   Patient may benefit from psychoeducation and brief therapeutic interventions regarding coping with symptoms of anxiety with panic attacks, depression and stress. Marland Kitchen  PLAN: 1. Follow up with behavioral health clinician on : One month (next medical visit), or earlier, as needed 2. Behavioral recommendations:  -Continue receiving therapy at [place] taking [place] as prescribed by medical provider -Begin taking Vistaril today, as prescribed by medical provider -Continue working with local agencies to find appropriate housing ([place], [employer]); consider applying for [employer] and [place] for families prior to birth of baby -Use sleep sound app for improved sleep at night (to help block out sounds outside room) -Curator regarding coping with symptoms of anxiety and depression related to [facility] issues  3. Referral(s): [place] (In Clinic) and [employer] Resources:  Housing 4. "From scale of 1-10, how likely are you to follow plan?": 8  Garlan Fair, LCSW  Depression screen [facility] 2/9 01/08/2018  Decreased Interest 0  Down, Depressed, Hopeless 0  PHQ - 2 Score 0  Altered sleeping 1  Tired, decreased energy 3  Change in appetite 0  Feeling bad or failure about yourself  0  Trouble concentrating 0  Moving slowly or fidgety/restless 0  Suicidal thoughts 0  PHQ-9 Score 4   GAD 7 : Generalized Anxiety Score 01/08/2018  Nervous, Anxious, on Edge 1  Control/stop worrying 1  Worry too much - different things 1  Trouble relaxing 1  Restless 0  Easily annoyed or irritable 1  Afraid - awful might happen 1  Total GAD 7 Score 6

## 2018-02-06 NOTE — Progress Notes (Signed)
error 

## 2018-02-16 ENCOUNTER — Emergency Department (HOSPITAL_COMMUNITY)
Admission: EM | Admit: 2018-02-16 | Discharge: 2018-02-16 | Disposition: A | Discharge status: Home / Self Care | Payer: Medicare (Managed Care) | Attending: Emergency Medicine | Admitting: Emergency Medicine

## 2018-02-16 ENCOUNTER — Encounter (HOSPITAL_COMMUNITY): Payer: Self-pay | Admitting: Nurse Practitioner

## 2018-02-16 ENCOUNTER — Other Ambulatory Visit: Payer: Self-pay

## 2018-02-16 DIAGNOSIS — G4489 Other headache syndrome: Secondary | ICD-10-CM | POA: Diagnosis not present

## 2018-02-16 DIAGNOSIS — J45909 Unspecified asthma, uncomplicated: Secondary | ICD-10-CM | POA: Diagnosis not present

## 2018-02-16 DIAGNOSIS — Z79899 Other long term (current) drug therapy: Secondary | ICD-10-CM | POA: Diagnosis not present

## 2018-02-16 DIAGNOSIS — F17210 Nicotine dependence, cigarettes, uncomplicated: Secondary | ICD-10-CM | POA: Insufficient documentation

## 2018-02-16 DIAGNOSIS — R569 Unspecified convulsions: Secondary | ICD-10-CM | POA: Diagnosis not present

## 2018-02-16 LAB — BASIC METABOLIC PANEL
ANION GAP: 10 (ref 5–15)
BUN: 7 mg/dL (ref 6–20)
CALCIUM: 9.3 mg/dL (ref 8.9–10.3)
CO2: 21 mmol/L — ABNORMAL LOW (ref 22–32)
Chloride: 108 mmol/L (ref 98–111)
Creatinine, Ser: 0.48 mg/dL (ref 0.44–1.00)
Glucose, Bld: 87 mg/dL (ref 70–99)
POTASSIUM: 4 mmol/L (ref 3.5–5.1)
Sodium: 139 mmol/L (ref 135–145)

## 2018-02-16 LAB — CBC WITH DIFFERENTIAL/PLATELET
BASOS PCT: 0 %
Basophils Absolute: 0 10*3/uL (ref 0.0–0.1)
EOS ABS: 0.1 10*3/uL (ref 0.0–0.7)
EOS PCT: 1 %
HCT: 40.4 % (ref 36.0–46.0)
Hemoglobin: 14.1 g/dL (ref 12.0–15.0)
Lymphocytes Relative: 18 %
Lymphs Abs: 2.9 10*3/uL (ref 0.7–4.0)
MCH: 30.9 pg (ref 26.0–34.0)
MCHC: 34.9 g/dL (ref 30.0–36.0)
MCV: 88.6 fL (ref 78.0–100.0)
MONO ABS: 0.9 10*3/uL (ref 0.1–1.0)
MONOS PCT: 6 %
NEUTROS ABS: 12.5 10*3/uL — AB (ref 1.7–7.7)
NEUTROS PCT: 75 %
PLATELETS: 310 10*3/uL (ref 150–400)
RBC: 4.56 MIL/uL (ref 3.87–5.11)
RDW: 14.1 % (ref 11.5–15.5)
WBC: 16.5 10*3/uL — ABNORMAL HIGH (ref 4.0–10.5)

## 2018-02-16 LAB — I-STAT BETA HCG BLOOD, ED (MC, WL, AP ONLY)

## 2018-02-16 LAB — RAPID URINE DRUG SCREEN, HOSP PERFORMED
AMPHETAMINES: NOT DETECTED
BENZODIAZEPINES: NOT DETECTED
Cocaine: NOT DETECTED
Opiates: NOT DETECTED
Tetrahydrocannabinol: NOT DETECTED

## 2018-02-16 LAB — URINALYSIS, ROUTINE W REFLEX MICROSCOPIC
Bilirubin Urine: NEGATIVE
GLUCOSE, UA: NEGATIVE mg/dL
Hgb urine dipstick: NEGATIVE
Ketones, ur: 5 mg/dL — AB
LEUKOCYTES UA: NEGATIVE
Nitrite: NEGATIVE
PH: 5 (ref 5.0–8.0)
Protein, ur: NEGATIVE mg/dL
Specific Gravity, Urine: 1.009 (ref 1.005–1.030)

## 2018-02-16 LAB — CBG MONITORING, ED: Glucose-Capillary: 80 mg/dL (ref 70–99)

## 2018-02-16 LAB — MAGNESIUM: Magnesium: 1.8 mg/dL (ref 1.7–2.4)

## 2018-02-16 MED ORDER — LEVETIRACETAM IN NACL 1000 MG/100ML IV SOLN
1000.0000 mg | Freq: Once | INTRAVENOUS | Status: AC
  Administered 2018-02-16: 1000 mg via INTRAVENOUS
  Filled 2018-02-16: qty 100

## 2018-02-16 MED ORDER — LEVETIRACETAM 500 MG PO TABS: 500.0000 mg | ORAL_TABLET | Freq: Two times a day (BID) | ORAL | 0 refills | Status: DC

## 2018-02-16 MED ORDER — METOCLOPRAMIDE HCL 5 MG/ML IJ SOLN
10.0000 mg | Freq: Once | INTRAMUSCULAR | Status: AC
  Administered 2018-02-16: 10 mg via INTRAVENOUS
  Filled 2018-02-16: qty 2

## 2018-02-16 MED ORDER — SODIUM CHLORIDE 0.9 % IV BOLUS
500.0000 mL | Freq: Once | INTRAVENOUS | Status: AC
  Administered 2018-02-16: 500 mL via INTRAVENOUS

## 2018-02-16 MED ORDER — ACETAMINOPHEN 500 MG PO TABS
1000.0000 mg | ORAL_TABLET | Freq: Once | ORAL | Status: AC
  Administered 2018-02-16: 1000 mg via ORAL
  Filled 2018-02-16: qty 2

## 2018-02-16 NOTE — ED Notes (Signed)
Discharge instructions reviewed with pt. Pt verbalized understanding. Pt to follow up with neuro. PIV removed. PT ambulatory to waiting room.

## 2018-02-16 NOTE — ED Notes (Signed)
Seizure pads applied to side rails. Pt has hx of seizures

## 2018-02-16 NOTE — Discharge Instructions (Signed)
You were seen for headache after seizure.   Labs looked normal.   You were given bolus of keppra.   Start taking your keppra as prescribed.   Per neurology recommendations, you are not to drive, operate heavy machinery, perform activities at heights or participate in water activities until 6 months seizure free  Call neurology to follow up and resume refills.   Return for severe headache, vision changes, vomiting, neck pain or stiffness, dizziness, one sided numbness weakness, difficulty with speech or walking, facial droop

## 2018-02-16 NOTE — ED Provider Notes (Signed)
[facility] DEPT Provider Note   CSN: [id number] Arrival date & time: 02/16/18  1345     History   Chief Complaint Chief Complaint  Patient presents with  . Migraine  . Seizure Disorder    HPI Amanda [name] is a 37 y.o. female with history of seizures on Keppra, bipolar disorder, asthma is here for evaluation of occipital headache onset 4 days ago.  Pain was sudden, gradually worsening and began after having a seizure.  Patient has long history of seizures and is supposed to take Keppra 500 mg twice daily but lost her medications 2 weeks ago while she was moving.  She had a seizure 4 days ago that she describes as "twitching" from her spine all the way to her extremities.  She typically does get headaches after seizures and her headache today is consistent with her previous procedure headaches.  Pain is described as a pounding, moderate, constant pain to the back of her head at this press onto her forehead.  Has tried laying in the dark without relief.  Has tried Tylenol that did not help.  Does state that she has been more stressed due to recent relocation also patient is 3-1/2 months pregnant.  Pregnancy was unexpected.  She denies vision changes, nausea, vomiting, chest pain, shortness of breath, abdominal pain, dysuria, hematuria, abnormal vaginal bleeding or discharge.  No head trauma.  No anticoagulants.  Stopped cigarettes 3.5 months ago. No ETOH use. No illicit drug use.  HPI  Past Medical History:  Diagnosis Date  . Anxiety   . Asthma   . Asthma due to environmental allergies   . Asthma due to seasonal allergies   . Bipolar 1 disorder ([place])   . Borderline personality disorder ([place])   . Brain bleed ([place])   . Chronic post-traumatic stress disorder (PTSD)    complex chronic with psychotic features and self harm behaviors  . Dander (animal) allergy   . Heart murmur   . Hip dysplasia   . Major depression, chronic   . Mood disorder ([place])   . OCD  (obsessive compulsive disorder)   . Rheumatoid arthritis ([place])   . Seizure disorder ([place])   . Suicidal ideations   . Suicide attempt [facility])     Patient Active Problem List   Diagnosis Date Noted  . UTI in pregnancy 01/29/2018  . Pap smear of cervix shows high risk HPV present 01/14/2018  . Supervision of high risk pregnancy, antepartum 01/08/2018  . Seizure disorder during pregnancy ([place]) 01/08/2018  . Advanced maternal age, primigravida 01/08/2018  . Bipolar 2 disorder ([place]) 01/08/2018  . Borderline personality disorder ([place]) 01/08/2018  . Major depression, chronic 01/08/2018  . PTSD (post-traumatic stress disorder) 01/08/2018  . OCD (obsessive compulsive disorder) 01/08/2018  . Asthma   . Seizure disorder ([place]) 11/06/2017  . Schizoaffective disorder, depressive type ([place]) 08/16/2017    Past Surgical History:  Procedure Laterality Date  . EYE SURGERY  1985  . SHUNT REMOVAL  1983  . Tooth Removal     multiple     OB History    Gravida  2   Para  0   Term  0   Preterm  0   AB  1   Living        SAB  1   TAB  0   Ectopic  0   Multiple      Live Births  Home Medications    Prior to Admission medications   Medication Sig Start Date End Date Taking? Authorizing Provider  folic acid (FOLVITE) 1 MG tablet Take 1 tablet (1 mg total) by mouth daily. 01/08/18  Yes Donnamae Jude, MD  Prenatal Multivit-Min-Fe-FA (PRENATAL VITAMINS) 0.8 MG tablet Take 1 tablet by mouth daily. 01/14/18  Yes Donnamae Jude, MD  albuterol (PROVENTIL HFA;VENTOLIN HFA) 108 (90 Base) MCG/ACT inhaler Inhale 2 puffs into the lungs every 6 (six) hours as needed for wheezing or shortness of breath.    [provider]  EPINEPHrine 0.3 mg/0.3 mL IJ SOAJ injection Inject 0.3 mg into the muscle as needed (for allergic reaction).     [provider]  hydrOXYzine (VISTARIL) 50 MG capsule Take 1 capsule (50 mg total) by mouth at bedtime. 02/06/18   Donnamae Jude,  MD  levETIRAcetam (KEPPRA) 500 MG tablet Take 1 tablet (500 mg total) by mouth 2 (two) times daily. 02/16/18   Kinnie Feil, PA-C  ondansetron (ZOFRAN ODT) 4 MG disintegrating tablet Take 1 tablet (4 mg total) by mouth every 8 (eight) hours as needed for nausea or vomiting. 01/26/18   [place], Sophia, PA-C    Family History Family History  Problem Relation Age of Onset  . Heart attack Mother   . Stroke Mother   . Liver cancer Mother   . Diabetes Mother   . Lung cancer Mother   . Alcoholism Father   . Sleep apnea Brother   . Depression Brother   . ADD / ADHD Brother   . Diabetes Maternal Aunt   . Diabetes Paternal Uncle   . Diabetes Maternal Grandmother     Social History Social History   Tobacco Use  . Smoking status: Current Every Day Smoker    Packs/day: 0.25    Types: Cigarettes  . Smokeless tobacco: Never Used  Substance Use Topics  . Alcohol use: Not Currently    Frequency: Never  . Drug use: No     Allergies   Aspartame and phenylalanine; Benadryl [diphenhydramine]; Scallops [shellfish allergy]; Bee venom; Benadryl [diphenhydramine]; Other; Pollen extract; Scallops [shellfish allergy]; Tegretol [carbamazepine]; and Tegretol [carbamazepine]   Review of Systems Review of Systems  Constitutional: Negative for fever.  HENT: Negative for sore throat.   Eyes: Positive for visual disturbance (spots).  Respiratory: Negative for shortness of breath.   Cardiovascular: Negative for chest pain.  Gastrointestinal: Negative for abdominal pain, nausea and vomiting.  Genitourinary: Negative for difficulty urinating.  Musculoskeletal: Negative for neck pain.  Allergic/Immunologic: Positive for immunocompromised state (pregnancy).  Neurological: Positive for seizures and headaches.  All other systems reviewed and are negative.    Physical Exam Updated Vital Signs BP 119/75   Pulse 71   Temp 98 F (36.7 C) (Oral)   Resp 19   Ht 5\' 8"  (1.727 m)   Wt 94.5 kg  (208 lb 6.4 oz)   LMP 11/07/2017   SpO2 95%   BMI 31.69 kg/m   Physical Exam  Constitutional: She appears well-developed.  Non-toxic appearance.  NAD.  HENT:  Head: Normocephalic and atraumatic.  Right Ear: External ear normal.  Left Ear: External ear normal.  Nose: Nose normal. No mucosal edema or septal deviation.  Moist mucous membranes Uvula midline Oropharynx and tonsils normal No tenderness over temporal arteries  Eyes: Conjunctivae and lids are normal.  Unable to visualize back of eye  Neck:  No c spine spinous process or muscular tenderness  Full PROM of neck  w/o rigidity  No meningeal signs   Cardiovascular: Normal rate, regular rhythm and normal heart sounds.  Pulses:      Radial pulses are 2+ on the right side, and 2+ on the left side.       Dorsalis pedis pulses are 2+ on the right side, and 2+ on the left side.  Pulmonary/Chest: Effort normal and breath sounds normal.  Abdominal: Soft. There is no tenderness.  Lymphadenopathy:  No cervical adenopathy  Neurological: She is alert. GCS eye subscore is 4. GCS verbal subscore is 5. GCS motor subscore is 6.  Alert and oriented to self, place, time and event.  Speech is fluent without aphasia. Strength 5/5 with hand grip and ankle F/E.   Sensation to light touch intact in hands and feet. Normal gait. No pronator drift.  Normal finger-to-nose and heel-to-shin test.  CN I and VIII not tested. CN II-XII intact bilaterally.   Skin: Skin is warm and dry. Capillary refill takes less than 2 seconds. No rash noted.  Psychiatric: She has a normal mood and affect. Her speech is normal and behavior is normal. Judgment and thought content normal.     ED Treatments / Results  Labs (all labs ordered are listed, but only abnormal results are displayed) Labs Reviewed  URINALYSIS, ROUTINE W REFLEX MICROSCOPIC - Abnormal; Notable for the following components:      Result Value   Ketones, ur 5 (*)    All other components within  normal limits  RAPID URINE DRUG SCREEN, HOSP PERFORMED - Abnormal; Notable for the following components:   Barbiturates   (*)    Value: Result not available. Reagent lot number recalled by manufacturer.   All other components within normal limits  BASIC METABOLIC PANEL - Abnormal; Notable for the following components:   CO2 21 (*)    All other components within normal limits  CBC WITH DIFFERENTIAL/PLATELET - Abnormal; Notable for the following components:   WBC 16.5 (*)    Neutro Abs 12.5 (*)    All other components within normal limits  I-STAT BETA HCG BLOOD, ED (MC, [facility], AP ONLY) - Abnormal; Notable for the following components:   I-stat hCG, quantitative >2,000.0 (*)    All other components within normal limits  URINE CULTURE  MAGNESIUM  CBG MONITORING, ED    EKG None  Radiology No results found.  Procedures Procedures (including critical care time)  Medications Ordered in ED Medications  metoCLOPramide (REGLAN) injection 10 mg (10 mg Intravenous Given 02/16/18 1851)  sodium chloride 0.9 % bolus 500 mL (0 mLs Intravenous Stopped 02/16/18 2018)  acetaminophen (TYLENOL) tablet 1,000 mg (1,000 mg Oral Given 02/16/18 1850)  levETIRAcetam (KEPPRA) IVPB 1000 mg/100 mL premix (0 mg Intravenous Stopped 02/16/18 1909)     Initial Impression / Assessment and Plan / ED Course  I have reviewed the triage vital signs and the nursing notes.  Pertinent labs & imaging results that were available during my care of the patient were reviewed by me and considered in my medical decision making (see chart for details).  Clinical Course as of Feb 17 49  Sun Feb 16, 2018  1836 Spoke to pharmacist regarding keppra dosing in pregnancy, 1g bolus ok.    [CG]  2001 WBC(!): 16.5 [CG]  2001 Re-evaluated pt reports mild improvement in HA.   [CG]    Clinical Course User Index [CG] Kinnie Feil, PA-C    37 year old here for after seizure.  Long history of seizures that are  frequently  followed by similar headache.  Noncompliant with Keppra in the last 2 weeks.  She has no red flag symptoms of headache including head trauma, anticoagulant use, thunderclap headache, vision changes, vomiting, neck pain or stiffness, fevers, chills, neuro deficit.  Exam as above is reassuring.  No meningismus.  Screening labs obtained and remarkable for WBC 16.5, this is chronic leukocytosis per chart review.  Could also be related to pregnancy.  She has no infectious symptoms such as cough, sore throat, dysuria, hematuria, vomiting or diarrhea.  Her urine is without infection.    Pt was given Keppra bolus, Reglan, IV fluids in ER with mild improvement in headache. Pt reports h/o "brain bleeders", it is unclear what this is. She reports yearly MRI for monitoring "brain bleeders" by previous neurologist in [place].  Chart was reviewed, she was established care with [facility] 5/22, neurologist repeated MRI at that time as diagnosis was unclear, MRI was normal. Given recent MRI without abnormalities, reassuring exam, I think seizures and post seizure HA likely from med non compliance less likely acute brain bleed. Will dc with neuro f/u and refill on keppra. Discussed we will defer CT imaging today in ER and pt is in agreement. Strict return precautions discussed.   Final Clinical Impressions(s) / ED Diagnoses   Final diagnoses:  Seizure ([place])  Headache syndrome    ED Discharge Orders        Ordered    levETIRAcetam (KEPPRA) 500 MG tablet  2 times daily     02/16/18 2003       Arlean Hopping 02/17/18 0050    Little, Wenda Overland, MD 02/18/18 1440

## 2018-02-16 NOTE — ED Triage Notes (Signed)
Pt is c/o a migraine headache localizing at the occipital aspect of her head and radiating to the frontal aspect of her head that she is describing as numbness. Of note, pt reports a significant neuroligal hx that includes seizure disorder and "brain bleeds" that he gets serial MRIs done by her neurologist, all which a chart review collaborates. She also has that 4 weeks ago, she lost her Keppra that is Rx'd for seizures, had seizure 4 days ago about the same time the migraine started. Pt reports that she is 3.5 months pregnant.

## 2018-02-19 LAB — URINE CULTURE: Culture: >=100000 — AB

## 2018-02-20 ENCOUNTER — Telehealth: Payer: Self-pay | Admitting: *Deleted

## 2018-02-20 NOTE — Telephone Encounter (Signed)
Post ED Visit - Positive Culture Follow-up: Unsuccessful Patient Follow-up  Culture assessed and recommendations reviewed by:  []  Elenor Quinones, Pharm.D. []  Heide Guile, Pharm.D., BCPS AQ-ID []  Parks Neptune, Pharm.D., BCPS []  Alycia Rossetti, Pharm.D., BCPS []  [place], Pharm.D., BCPS, AAHIVP []  Legrand Como, Pharm.D., BCPS, AAHIVP []  Wynell Balloon, PharmD []  Vincenza Hews, PharmD, BCPS  Positive urine culture  [x]  Patient discharged without antimicrobial prescription and treatment is now indicated, Cephalexin 500mg  PO BID x 5 days, Shawn Joy PA-C []  Organism is resistant to prescribed ED discharge antimicrobial []  Patient with positive blood cultures   Unable to contact patient after 3 attempts, letter will be sent to address on file  Ardeen Fillers 02/20/2018, 9:57 AM

## 2018-02-20 NOTE — Progress Notes (Signed)
ED Antimicrobial Stewardship Positive Culture Follow Up   Amanda [name] is an 37 y.o. female who presented to [facility] on 02/16/2018 with a chief complaint of occipital headache. Of note, pt is 3 1/2 months pregnant. Chief Complaint  Patient presents with  . Migraine  . Seizure Disorder    Recent Results (from the past 720 hour(s))  Urine culture     Status: Abnormal   Collection Time: 01/26/18  2:04 PM  Result Value Ref Range Status   Specimen Description   Final    URINE, CLEAN CATCH Performed at [facility], [place] [address], [place], [place] [address]    Special Requests   Final    NONE Performed at [facility], [place] [address] [place], [place] [address]    Culture >=100,000 COLONIES/mL ESCHERICHIA COLI (A)  Final   Report Status 01/29/2018 FINAL  Final   Organism ID, Bacteria ESCHERICHIA COLI (A)  Final      Susceptibility   Escherichia coli - MIC*    AMPICILLIN >=32 RESISTANT Resistant     CEFAZOLIN <=4 SENSITIVE Sensitive     CEFTRIAXONE <=1 SENSITIVE Sensitive     CIPROFLOXACIN <=0.25 SENSITIVE Sensitive     GENTAMICIN <=1 SENSITIVE Sensitive     IMIPENEM <=0.25 SENSITIVE Sensitive     NITROFURANTOIN <=16 SENSITIVE Sensitive     TRIMETH/SULFA >=320 RESISTANT Resistant     AMPICILLIN/SULBACTAM 16 INTERMEDIATE Intermediate     PIP/TAZO <=4 SENSITIVE Sensitive     Extended ESBL NEGATIVE Sensitive     * >=100,000 COLONIES/mL ESCHERICHIA COLI  Urine culture     Status: Abnormal   Collection Time: 02/16/18  6:22 PM  Result Value Ref Range Status   Specimen Description   Final    URINE, CLEAN CATCH Performed at [place] [address] [place], [place] [address]    Special Requests   Final    NONE Performed at [facility], [place] [address] [place], [place] [address]    Culture >=100,000 COLONIES/mL ESCHERICHIA COLI (A)  Final   Report Status 02/19/2018 FINAL  Final   Organism ID,  Bacteria ESCHERICHIA COLI (A)  Final      Susceptibility   Escherichia coli - MIC*    AMPICILLIN >=32 RESISTANT Resistant     CEFAZOLIN <=4 SENSITIVE Sensitive     CEFTRIAXONE <=1 SENSITIVE Sensitive     CIPROFLOXACIN <=0.25 SENSITIVE Sensitive     GENTAMICIN <=1 SENSITIVE Sensitive     IMIPENEM <=0.25 SENSITIVE Sensitive     NITROFURANTOIN <=16 SENSITIVE Sensitive     TRIMETH/SULFA >=320 RESISTANT Resistant     AMPICILLIN/SULBACTAM 16 INTERMEDIATE Intermediate     PIP/TAZO <=4 SENSITIVE Sensitive     Extended ESBL NEGATIVE Sensitive     * >=100,000 COLONIES/mL ESCHERICHIA COLI    [x]  Patient discharged originally without antimicrobial agent and treatment is now indicated  New antibiotic prescription: Cephalexin 500 mg by mouth twice daily x 5 days   ED Provider: Arlean Hopping, PA-C  Albertina Parr, PharmD., BCPS Clinical Pharmacist Clinical phone for 02/20/18 until 3:30pm: [id number] If after 3:30pm, please refer to [facility] for unit-specific pharmacist

## 2018-02-26 ENCOUNTER — Encounter: Payer: Self-pay | Admitting: *Deleted

## 2018-03-01 ENCOUNTER — Other Ambulatory Visit: Payer: Self-pay

## 2018-03-01 ENCOUNTER — Emergency Department (HOSPITAL_COMMUNITY)
Admission: EM | Admit: 2018-03-01 | Discharge: 2018-03-02 | Disposition: A | Discharge status: Home / Self Care | Payer: Medicare (Managed Care) | Attending: Emergency Medicine | Admitting: Emergency Medicine

## 2018-03-01 ENCOUNTER — Emergency Department (HOSPITAL_COMMUNITY): Payer: Medicare (Managed Care)

## 2018-03-01 DIAGNOSIS — Y929 Unspecified place or not applicable: Secondary | ICD-10-CM | POA: Insufficient documentation

## 2018-03-01 DIAGNOSIS — Z79899 Other long term (current) drug therapy: Secondary | ICD-10-CM | POA: Insufficient documentation

## 2018-03-01 DIAGNOSIS — X509XXA Other and unspecified overexertion or strenuous movements or postures, initial encounter: Secondary | ICD-10-CM | POA: Diagnosis not present

## 2018-03-01 DIAGNOSIS — F17210 Nicotine dependence, cigarettes, uncomplicated: Secondary | ICD-10-CM | POA: Insufficient documentation

## 2018-03-01 DIAGNOSIS — J45909 Unspecified asthma, uncomplicated: Secondary | ICD-10-CM | POA: Insufficient documentation

## 2018-03-01 DIAGNOSIS — Y9301 Activity, walking, marching and hiking: Secondary | ICD-10-CM | POA: Insufficient documentation

## 2018-03-01 DIAGNOSIS — S93401A Sprain of unspecified ligament of right ankle, initial encounter: Secondary | ICD-10-CM | POA: Diagnosis not present

## 2018-03-01 DIAGNOSIS — Y999 Unspecified external cause status: Secondary | ICD-10-CM | POA: Diagnosis not present

## 2018-03-01 DIAGNOSIS — S99911A Unspecified injury of right ankle, initial encounter: Secondary | ICD-10-CM | POA: Diagnosis present

## 2018-03-01 MED ORDER — ACETAMINOPHEN 325 MG PO TABS
650.0000 mg | ORAL_TABLET | Freq: Once | ORAL | Status: AC
  Administered 2018-03-02: 650 mg via ORAL
  Filled 2018-03-01: qty 2

## 2018-03-01 NOTE — ED Notes (Signed)
Pt said she is [redacted] weeks pregnant.

## 2018-03-01 NOTE — Discharge Instructions (Signed)
Use Tylenol as needed for pain. Wear the ASO brace to help with support and compression. Use ice whenever you are able, 20 minutes at a time. Use crutches as needed for pain.  After a few days, you should start moving your ankle so that it does not get too stiff. Return to the emergency room with any new, worsening, or concerning symptoms.

## 2018-03-01 NOTE — ED Provider Notes (Signed)
[facility] DEPT Provider Note   CSN: [id number] Arrival date & time: 03/01/18  2127     History   Chief Complaint Chief Complaint  Patient presents with  . Ankle Pain    HPI Amanda [name] is a 37 y.o. female presenting for evaluation of right ankle pain.  Patient states she was walking when she stepped in a hole and inverted her right ankle.  She reports acute onset pain.  No radiation of the pain.  She has not taken anything including ibuprofen.  She denies injury elsewhere.  She denies numbness or tingling of the foot.  She states that she had an associated crack/pop.  She states she is unable to bear weight due to pain, although was able to ambulate with a cane to the ER.  Additionally, patient states that she is homeless, and is looking for resources.  HPI  Past Medical History:  Diagnosis Date  . Anxiety   . Asthma   . Asthma due to environmental allergies   . Asthma due to seasonal allergies   . Bipolar 1 disorder ([place])   . Borderline personality disorder ([place])   . Brain bleed ([place])   . Chronic post-traumatic stress disorder (PTSD)    complex chronic with psychotic features and self harm behaviors  . Dander (animal) allergy   . Heart murmur   . Hip dysplasia   . Major depression, chronic   . Mood disorder ([place])   . OCD (obsessive compulsive disorder)   . Rheumatoid arthritis ([place])   . Seizure disorder ([place])   . Suicidal ideations   . Suicide attempt [facility])     Patient Active Problem List   Diagnosis Date Noted  . UTI in pregnancy 01/29/2018  . Pap smear of cervix shows high risk HPV present 01/14/2018  . Supervision of high risk pregnancy, antepartum 01/08/2018  . Seizure disorder during pregnancy ([place]) 01/08/2018  . Advanced maternal age, primigravida 01/08/2018  . Bipolar 2 disorder ([place]) 01/08/2018  . Borderline personality disorder ([place]) 01/08/2018  . Major depression, chronic 01/08/2018  . PTSD (post-traumatic stress  disorder) 01/08/2018  . OCD (obsessive compulsive disorder) 01/08/2018  . Asthma   . Seizure disorder ([place]) 11/06/2017  . Schizoaffective disorder, depressive type ([place]) 08/16/2017    Past Surgical History:  Procedure Laterality Date  . EYE SURGERY  1985  . SHUNT REMOVAL  1983  . Tooth Removal     multiple     OB History    Gravida  2   Para  0   Term  0   Preterm  0   AB  1   Living        SAB  1   TAB  0   Ectopic  0   Multiple      Live Births               Home Medications    Prior to Admission medications   Medication Sig Start Date End Date Taking? Authorizing Provider  albuterol (PROVENTIL HFA;VENTOLIN HFA) 108 (90 Base) MCG/ACT inhaler Inhale 2 puffs into the lungs every 6 (six) hours as needed for wheezing or shortness of breath.    [provider]  EPINEPHrine 0.3 mg/0.3 mL IJ SOAJ injection Inject 0.3 mg into the muscle as needed (for allergic reaction).     [provider]  folic acid (FOLVITE) 1 MG tablet Take 1 tablet (1 mg total) by mouth daily. 01/08/18   Donnamae Jude,  MD  hydrOXYzine (VISTARIL) 50 MG capsule Take 1 capsule (50 mg total) by mouth at bedtime. 02/06/18   Donnamae Jude, MD  levETIRAcetam (KEPPRA) 500 MG tablet Take 1 tablet (500 mg total) by mouth 2 (two) times daily. 02/16/18   Kinnie Feil, PA-C  ondansetron (ZOFRAN ODT) 4 MG disintegrating tablet Take 1 tablet (4 mg total) by mouth every 8 (eight) hours as needed for nausea or vomiting. 01/26/18   Dyllen Menning, PA-C  Prenatal Multivit-Min-Fe-FA (PRENATAL VITAMINS) 0.8 MG tablet Take 1 tablet by mouth daily. 01/14/18   Donnamae Jude, MD    Family History Family History  Problem Relation Age of Onset  . Heart attack Mother   . Stroke Mother   . Liver cancer Mother   . Diabetes Mother   . Lung cancer Mother   . Alcoholism Father   . Sleep apnea Brother   . Depression Brother   . ADD / ADHD Brother   . Diabetes Maternal Aunt   . Diabetes  Paternal Uncle   . Diabetes Maternal Grandmother     Social History Social History   Tobacco Use  . Smoking status: Current Every Day Smoker    Packs/day: 0.25    Types: Cigarettes  . Smokeless tobacco: Never Used  Substance Use Topics  . Alcohol use: Not Currently    Frequency: Never  . Drug use: No     Allergies   Aspartame and phenylalanine; Benadryl [diphenhydramine]; Scallops [shellfish allergy]; Bee venom; Benadryl [diphenhydramine]; Other; Pollen extract; Scallops [shellfish allergy]; Tegretol [carbamazepine]; and Tegretol [carbamazepine]   Review of Systems Review of Systems  Musculoskeletal: Positive for arthralgias.  Neurological: Negative for numbness.     Physical Exam Updated Vital Signs BP (!) 101/91 (BP Location: Left Arm)   Pulse 85   Temp 98.2 F (36.8 C) (Oral)   Resp 16   LMP 11/07/2017   SpO2 98%   Physical Exam  Constitutional: She is oriented to person, place, and time. She appears well-developed and well-nourished. No distress.  HENT:  Head: Normocephalic and atraumatic.  Eyes: EOM are normal.  Neck: Normal range of motion.  Pulmonary/Chest: Effort normal.  Abdominal: She exhibits no distension.  Musculoskeletal: Normal range of motion. She exhibits tenderness. She exhibits no edema or deformity.  No significant swelling noted of the right ankle.  Tenderness palpation of the lateral malleolus.  Pedal pulses intact bilaterally.  Good cap refill.  Achilles tendon palpable and intact.  Neurological: She is alert and oriented to person, place, and time. No sensory deficit.  Skin: Skin is warm. Capillary refill takes less than 2 seconds. No rash noted.  Psychiatric: She has a normal mood and affect.  Nursing note and vitals reviewed.    ED Treatments / Results  Labs (all labs ordered are listed, but only abnormal results are displayed) Labs Reviewed - No data to display  EKG None  Radiology Dg Ankle Complete Right  Result Date:  03/01/2018 CLINICAL DATA:  37 year old female with trauma to the right ankle. EXAM: RIGHT ANKLE - COMPLETE 3+ VIEW COMPARISON:  None. FINDINGS: There is no evidence of fracture, dislocation, or joint effusion. There is no evidence of arthropathy or other focal bone abnormality. Soft tissues are unremarkable. IMPRESSION: Negative. Electronically Signed   By: Anner Crete M.D.   On: 03/01/2018 22:52    Procedures Procedures (including critical care time)  Medications Ordered in ED Medications  acetaminophen (TYLENOL) tablet 650 mg (has no administration in time range)  Initial Impression / Assessment and Plan / ED Course  I have reviewed the triage vital signs and the nursing notes.  Pertinent labs & imaging results that were available during my care of the patient were reviewed by me and considered in my medical decision making (see chart for details).     Patient presenting for evaluation of right ankle pain.  Physical examination, she is neurovascularly intact.  X-ray viewed interpreted by me, no fractures or dislocations.  Discussed findings with patient.  Discussed treatment with Tylenol, ASO, and crutches as needed for pain.  At this time, patient appears safe for discharge.  Return precautions given.  Patient states she understands and agrees plan.  Final Clinical Impressions(s) / ED Diagnoses   Final diagnoses:  Sprain of right ankle, unspecified ligament, initial encounter    ED Discharge Orders    None       Franchot Heidelberg, PA-C 03/01/18 2323    Charlesetta Shanks, MD 03/09/18 2259

## 2018-03-06 ENCOUNTER — Ambulatory Visit (INDEPENDENT_AMBULATORY_CARE_PROVIDER_SITE_OTHER): Payer: Self-pay | Admitting: Clinical

## 2018-03-06 ENCOUNTER — Encounter: Payer: Self-pay | Admitting: Obstetrics and Gynecology

## 2018-03-06 ENCOUNTER — Ambulatory Visit (INDEPENDENT_AMBULATORY_CARE_PROVIDER_SITE_OTHER): Payer: Self-pay | Admitting: Obstetrics and Gynecology

## 2018-03-06 VITALS — BP 119/65 | HR 71 | Wt 204.4 lb

## 2018-03-06 DIAGNOSIS — F259 Schizoaffective disorder, unspecified: Secondary | ICD-10-CM

## 2018-03-06 DIAGNOSIS — G40909 Epilepsy, unspecified, not intractable, without status epilepticus: Secondary | ICD-10-CM

## 2018-03-06 DIAGNOSIS — F251 Schizoaffective disorder, depressive type: Secondary | ICD-10-CM

## 2018-03-06 DIAGNOSIS — O09512 Supervision of elderly primigravida, second trimester: Secondary | ICD-10-CM

## 2018-03-06 DIAGNOSIS — O0990 Supervision of high risk pregnancy, unspecified, unspecified trimester: Secondary | ICD-10-CM

## 2018-03-06 DIAGNOSIS — F4310 Post-traumatic stress disorder, unspecified: Secondary | ICD-10-CM

## 2018-03-06 DIAGNOSIS — F3181 Bipolar II disorder: Secondary | ICD-10-CM

## 2018-03-06 DIAGNOSIS — O99352 Diseases of the nervous system complicating pregnancy, second trimester: Secondary | ICD-10-CM

## 2018-03-06 NOTE — BH Specialist Note (Signed)
Integrated Behavioral Health Follow Up Visit  MRN: [id number] Name: Amanda [name]  Number of [place] Clinician visits: 2/6 Session Start time: 2:00 Session End time: 3:00 Total time: 1 hour  Type of Service: [place] Interpretor:No. Interpretor Name and Language: n/a  SUBJECTIVE: Amanda [name] is a 37 y.o. female accompanied by n/a Patient was referred by Darron Doom, MD for Bipolar Patient reports the following symptoms/concerns: Pt states her primary concern today is a need to discuss her feelings concerning her relationship difficulties with FOB. 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  Reduce symptoms of: anxiety, mood instability and stress  2.  Increase knowledge and/or ability of: stress reduction  3.  Demonstrate ability to: Increase healthy adjustment to current life circumstances  INTERVENTIONS: Interventions utilized:  Supportive Counseling and Medication Monitoring Standardized Assessments completed: GAD-7 and PHQ 9  ASSESSMENT: Patient currently experiencing Bipolar 2 disorder, Borderline personality disorder, Chronic post-traumatic stress disorder, and Schizoaffective disorder, depressive type, per previous psychiatry diagnosis   Patient may benefit from continue brief therapeutic interventions regarding coping with symptoms of mood instability and life stress.   PLAN: 1. Follow up with behavioral health clinician on : As needed 2. Behavioral recommendation:  -Pick up Vistaril and begin taking today -Continue attending routine therapy  services at [place] taking [place] as prescribed by medical provider -Continue working with local agencies to obtain more stable housing([employer]; [employer], [employer] ) -Continue using sleep app for improved sleep 3. Referral(s): [place] (In Clinic) 4. "From scale of 1-10, how likely are you to follow plan?": -  Garlan Fair, LCSW  Depression screen [facility] 2/9 03/06/2018 02/06/2018 01/08/2018  Decreased Interest 0 0 0  Down, Depressed, Hopeless 2 - 0  PHQ - 2 Score 2 0 0  Altered sleeping 2 2 1   Tired, decreased energy 2 0 3  Change in appetite 1 0 0  Feeling bad or failure about yourself  0 0 0  Trouble concentrating 0 0 0  Moving slowly or fidgety/restless 1 0 0  Suicidal thoughts 0 0 0  PHQ-9 Score 8 2 4   Difficult doing work/chores Somewhat difficult - -   GAD 7 : Generalized Anxiety Score 03/06/2018 02/06/2018 01/08/2018  Nervous, Anxious, on Edge 2 0 1  Control/stop worrying 2 1 1   Worry too much - different things 2 1 1   Trouble relaxing 1 1 1   Restless 1 0 0  Easily annoyed or irritable 2 1 1   Afraid - awful might happen 2 1 1   Total GAD 7 Score 12 5 6   Anxiety Difficulty Somewhat difficult - -

## 2018-03-06 NOTE — Progress Notes (Signed)
   PRENATAL VISIT NOTE  Subjective:  Amanda [name] is a 37 y.o. G2P0010 at [redacted]w[redacted]d being seen today for ongoing prenatal care.  She is currently monitored for the following issues for this high-risk pregnancy and has Schizoaffective disorder, depressive type ([place]); Seizure disorder ([place]); Supervision of high risk pregnancy, antepartum; Seizure disorder during pregnancy ([place]); Advanced maternal age, primigravida; Bipolar 2 disorder ([place]); Borderline personality disorder ([place]); Major depression, chronic; PTSD (post-traumatic stress disorder); OCD (obsessive compulsive disorder); Asthma; Pap smear of cervix shows high risk HPV present; and UTI in pregnancy on their problem list.  Patient reports no complaints.  Contractions: Not present. Vag. Bleeding: None.  Movement: Absent. Denies leaking of fluid.   The following portions of the patient's history were reviewed and updated as appropriate: allergies, current medications, past family history, past medical history, past social history, past surgical history and problem list. Problem list updated.  Objective:   Vitals:   03/06/18 1327  BP: 119/65  Pulse: 71  Weight: 204 lb 6.4 oz (92.7 kg)    Fetal Status: Fetal Heart Rate (bpm): 158   Movement: Absent     General:  Alert, oriented and cooperative. Patient is in no acute distress.  Skin: Skin is warm and dry. No rash noted.   Cardiovascular: Normal heart rate noted  Respiratory: Normal respiratory effort, no problems with respiration noted  Abdomen: Soft, gravid, appropriate for gestational age.  Pain/Pressure: Absent     Pelvic: Cervical exam deferred        Extremities: Normal range of motion.  Edema: Trace  Mental Status: Normal mood and affect. Normal behavior. Normal judgment and thought content.   Assessment and Plan:  Pregnancy: G2P0010 at [redacted]w[redacted]d  1. Seizure disorder during pregnancy in second trimester ([place]) Stable  2. Supervision of high risk pregnancy, antepartum Patient is  doing well Anatomy ultrasound ordered AFP today - [place] MFM OB DETAIL +14 WK; Future - AFP, Serum, Open Spina Bifida  3. Primigravida of advanced maternal age in second trimester   4. Schizoaffective disorder, depressive type ([place])   Preterm labor symptoms and general obstetric precautions including but not limited to vaginal bleeding, contractions, leaking of fluid and fetal movement were reviewed in detail with the patient. Please refer to After Visit Summary for other counseling recommendations.  Return in about 1 month (around 04/03/2018) for [place].  Future Appointments  Date Time Provider [place]  03/06/2018  2:00 PM [place], MD

## 2018-03-08 LAB — AFP, SERUM, OPEN SPINA BIFIDA
AFP MoM: 0.92
AFP Value: 28.1 ng/mL
GEST. AGE ON COLLECTION DATE: 17 wk
Maternal Age At EDD: 38.3 yr
OSBR Risk 1 IN: 10000
Test Results:: NEGATIVE
WEIGHT: 204 [lb_av]

## 2018-03-13 ENCOUNTER — Encounter (HOSPITAL_COMMUNITY): Payer: Self-pay

## 2018-03-20 ENCOUNTER — Ambulatory Visit (HOSPITAL_COMMUNITY)
Admission: RE | Admit: 2018-03-20 | Discharge: 2018-03-20 | Disposition: A | Discharge status: Home / Self Care | Payer: Self-pay | Source: Ambulatory Visit | Attending: Obstetrics and Gynecology | Admitting: Obstetrics and Gynecology

## 2018-03-20 ENCOUNTER — Other Ambulatory Visit: Payer: Self-pay | Admitting: Obstetrics and Gynecology

## 2018-03-20 ENCOUNTER — Other Ambulatory Visit (HOSPITAL_COMMUNITY): Payer: Self-pay | Admitting: *Deleted

## 2018-03-20 DIAGNOSIS — Z3689 Encounter for other specified antenatal screening: Secondary | ICD-10-CM | POA: Insufficient documentation

## 2018-03-20 DIAGNOSIS — F259 Schizoaffective disorder, unspecified: Secondary | ICD-10-CM | POA: Insufficient documentation

## 2018-03-20 DIAGNOSIS — O99352 Diseases of the nervous system complicating pregnancy, second trimester: Secondary | ICD-10-CM | POA: Insufficient documentation

## 2018-03-20 DIAGNOSIS — Z79899 Other long term (current) drug therapy: Secondary | ICD-10-CM | POA: Insufficient documentation

## 2018-03-20 DIAGNOSIS — Z3A19 19 weeks gestation of pregnancy: Secondary | ICD-10-CM

## 2018-03-20 DIAGNOSIS — O09522 Supervision of elderly multigravida, second trimester: Secondary | ICD-10-CM

## 2018-03-20 DIAGNOSIS — Z362 Encounter for other antenatal screening follow-up: Secondary | ICD-10-CM

## 2018-03-20 DIAGNOSIS — F319 Bipolar disorder, unspecified: Secondary | ICD-10-CM | POA: Insufficient documentation

## 2018-03-20 DIAGNOSIS — O0990 Supervision of high risk pregnancy, unspecified, unspecified trimester: Secondary | ICD-10-CM

## 2018-03-20 DIAGNOSIS — O99342 Other mental disorders complicating pregnancy, second trimester: Secondary | ICD-10-CM | POA: Insufficient documentation

## 2018-03-20 DIAGNOSIS — G40909 Epilepsy, unspecified, not intractable, without status epilepticus: Secondary | ICD-10-CM | POA: Insufficient documentation
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
## 2018-04-04 ENCOUNTER — Other Ambulatory Visit: Payer: Self-pay

## 2018-04-04 ENCOUNTER — Encounter (HOSPITAL_COMMUNITY): Payer: Self-pay

## 2018-04-04 ENCOUNTER — Emergency Department (HOSPITAL_COMMUNITY)
Admission: EM | Admit: 2018-04-04 | Discharge: 2018-04-04 | Disposition: A | Discharge status: Home / Self Care | Payer: Medicare (Managed Care) | Attending: Emergency Medicine | Admitting: Emergency Medicine

## 2018-04-04 DIAGNOSIS — E860 Dehydration: Secondary | ICD-10-CM | POA: Diagnosis not present

## 2018-04-04 DIAGNOSIS — F419 Anxiety disorder, unspecified: Secondary | ICD-10-CM | POA: Diagnosis not present

## 2018-04-04 DIAGNOSIS — O99712 Diseases of the skin and subcutaneous tissue complicating pregnancy, second trimester: Secondary | ICD-10-CM | POA: Diagnosis present

## 2018-04-04 DIAGNOSIS — R202 Paresthesia of skin: Secondary | ICD-10-CM | POA: Diagnosis not present

## 2018-04-04 DIAGNOSIS — O99342 Other mental disorders complicating pregnancy, second trimester: Secondary | ICD-10-CM | POA: Insufficient documentation

## 2018-04-04 DIAGNOSIS — Y929 Unspecified place or not applicable: Secondary | ICD-10-CM | POA: Diagnosis not present

## 2018-04-04 DIAGNOSIS — O99332 Smoking (tobacco) complicating pregnancy, second trimester: Secondary | ICD-10-CM | POA: Diagnosis not present

## 2018-04-04 DIAGNOSIS — J45909 Unspecified asthma, uncomplicated: Secondary | ICD-10-CM | POA: Insufficient documentation

## 2018-04-04 DIAGNOSIS — R350 Frequency of micturition: Secondary | ICD-10-CM | POA: Diagnosis not present

## 2018-04-04 DIAGNOSIS — F319 Bipolar disorder, unspecified: Secondary | ICD-10-CM | POA: Diagnosis not present

## 2018-04-04 DIAGNOSIS — T63441A Toxic effect of venom of bees, accidental (unintentional), initial encounter: Secondary | ICD-10-CM

## 2018-04-04 DIAGNOSIS — W57XXXA Bitten or stung by nonvenomous insect and other nonvenomous arthropods, initial encounter: Secondary | ICD-10-CM | POA: Insufficient documentation

## 2018-04-04 DIAGNOSIS — F17210 Nicotine dependence, cigarettes, uncomplicated: Secondary | ICD-10-CM | POA: Diagnosis not present

## 2018-04-04 DIAGNOSIS — F259 Schizoaffective disorder, unspecified: Secondary | ICD-10-CM | POA: Insufficient documentation

## 2018-04-04 DIAGNOSIS — Y998 Other external cause status: Secondary | ICD-10-CM | POA: Diagnosis not present

## 2018-04-04 DIAGNOSIS — S50361A Insect bite (nonvenomous) of right elbow, initial encounter: Secondary | ICD-10-CM | POA: Diagnosis not present

## 2018-04-04 DIAGNOSIS — O26892 Other specified pregnancy related conditions, second trimester: Secondary | ICD-10-CM | POA: Insufficient documentation

## 2018-04-04 DIAGNOSIS — O9952 Diseases of the respiratory system complicating childbirth: Secondary | ICD-10-CM | POA: Diagnosis not present

## 2018-04-04 DIAGNOSIS — Z3A21 21 weeks gestation of pregnancy: Secondary | ICD-10-CM | POA: Diagnosis not present

## 2018-04-04 DIAGNOSIS — Y9389 Activity, other specified: Secondary | ICD-10-CM | POA: Diagnosis not present

## 2018-04-04 DIAGNOSIS — O211 Hyperemesis gravidarum with metabolic disturbance: Secondary | ICD-10-CM | POA: Diagnosis not present

## 2018-04-04 LAB — COMPREHENSIVE METABOLIC PANEL
ALBUMIN: 3.1 g/dL — AB (ref 3.5–5.0)
ALK PHOS: 91 U/L (ref 38–126)
ALT: 13 U/L (ref 0–44)
ANION GAP: 11 (ref 5–15)
AST: 15 U/L (ref 15–41)
BILIRUBIN TOTAL: 0.9 mg/dL (ref 0.3–1.2)
BUN: <5 mg/dL — ABNORMAL LOW (ref 6–20)
CALCIUM: 9 mg/dL (ref 8.9–10.3)
CO2: 21 mmol/L — AB (ref 22–32)
Chloride: 107 mmol/L (ref 98–111)
Creatinine, Ser: 0.52 mg/dL (ref 0.44–1.00)
GFR calc non Af Amer: >60 mL/min (ref >60)
GLUCOSE: 82 mg/dL (ref 70–99)
POTASSIUM: 3.6 mmol/L (ref 3.5–5.1)
SODIUM: 139 mmol/L (ref 135–145)
TOTAL PROTEIN: 6.3 g/dL — AB (ref 6.5–8.1)

## 2018-04-04 LAB — CBC WITH DIFFERENTIAL/PLATELET
Abs Immature Granulocytes: 0.1 10*3/uL (ref 0.0–0.1)
BASOS ABS: 0.1 10*3/uL (ref 0.0–0.1)
Basophils Relative: 0 %
EOS ABS: 0.2 10*3/uL (ref 0.0–0.7)
EOS PCT: 1 %
HCT: 39.4 % (ref 36.0–46.0)
HEMOGLOBIN: 13.3 g/dL (ref 12.0–15.0)
Immature Granulocytes: 1 %
Lymphocytes Relative: 19 %
Lymphs Abs: 3.3 10*3/uL (ref 0.7–4.0)
MCH: 30.8 pg (ref 26.0–34.0)
MCHC: 33.8 g/dL (ref 30.0–36.0)
MCV: 91.2 fL (ref 78.0–100.0)
MONO ABS: 1 10*3/uL (ref 0.1–1.0)
Monocytes Relative: 6 %
Neutro Abs: 12.9 10*3/uL — ABNORMAL HIGH (ref 1.7–7.7)
Neutrophils Relative %: 73 %
Platelets: 301 10*3/uL (ref 150–400)
RBC: 4.32 MIL/uL (ref 3.87–5.11)
RDW: 13.7 % (ref 11.5–15.5)
WBC: 17.5 10*3/uL — AB (ref 4.0–10.5)

## 2018-04-04 LAB — URINALYSIS, ROUTINE W REFLEX MICROSCOPIC
BILIRUBIN URINE: NEGATIVE
GLUCOSE, UA: NEGATIVE mg/dL
HGB URINE DIPSTICK: NEGATIVE
Ketones, ur: 80 mg/dL — AB
LEUKOCYTES UA: NEGATIVE
Nitrite: NEGATIVE
Protein, ur: 30 mg/dL — AB
SPECIFIC GRAVITY, URINE: 1.024 (ref 1.005–1.030)
Squamous Epithelial / LPF: >50 — ABNORMAL HIGH (ref 0–5)
pH: 6 (ref 5.0–8.0)

## 2018-04-04 LAB — MAGNESIUM: Magnesium: 1.7 mg/dL (ref 1.7–2.4)

## 2018-04-04 MED ORDER — SODIUM CHLORIDE 0.9 % IV BOLUS
1000.0000 mL | Freq: Once | INTRAVENOUS | Status: AC
  Administered 2018-04-04: 1000 mL via INTRAVENOUS

## 2018-04-04 MED ORDER — EPINEPHRINE 0.3 MG/0.3ML IJ SOAJ: 0.3000 mg | Freq: Once | INTRAMUSCULAR | 0 refills | Status: AC

## 2018-04-04 NOTE — ED Provider Notes (Signed)
Amanda [name] Provider Note   CSN: [id number] Arrival date & time: 04/04/18  1215     History   Chief Complaint Chief Complaint  Patient presents with  . Insect Bite    HPI Amanda [name] is a 37 y.o. female.  The history is provided by the patient and medical records. No language interpreter was used.  Allergic Reaction  Presenting symptoms: no difficulty breathing, no difficulty swallowing, no itching, no rash, no swelling and no wheezing   Severity:  Mild Duration:  2 days Prior allergic episodes:  Insect allergies Context: insect bite/sting   Relieved by:  Nothing Worsened by:  Nothing Ineffective treatments:  None tried   Past Medical History:  Diagnosis Date  . Anxiety   . Asthma   . Asthma due to environmental allergies   . Asthma due to seasonal allergies   . Bipolar 1 disorder ([place])   . Borderline personality disorder ([place])   . Brain bleed ([place])   . Chronic post-traumatic stress disorder (PTSD)    complex chronic with psychotic features and self harm behaviors  . Dander (animal) allergy   . Heart murmur   . Hip dysplasia   . Major depression, chronic   . Mood disorder ([place])   . OCD (obsessive compulsive disorder)   . Rheumatoid arthritis ([place])   . Seizure disorder ([place])   . Suicidal ideations   . Suicide attempt [facility])     Patient Active Problem List   Diagnosis Date Noted  . UTI in pregnancy 01/29/2018  . Pap smear of cervix shows high risk HPV present 01/14/2018  . Supervision of high risk pregnancy, antepartum 01/08/2018  . Seizure disorder during pregnancy ([place]) 01/08/2018  . Advanced maternal age, primigravida 01/08/2018  . Bipolar 2 disorder ([place]) 01/08/2018  . Borderline personality disorder ([place]) 01/08/2018  . Major depression, chronic 01/08/2018  . PTSD (post-traumatic stress disorder) 01/08/2018  . OCD (obsessive compulsive disorder) 01/08/2018  . Asthma   . Seizure disorder ([place]) 11/06/2017  .  Schizoaffective disorder, depressive type ([place]) 08/16/2017    Past Surgical History:  Procedure Laterality Date  . EYE SURGERY  1985  . SHUNT REMOVAL  1983  . Tooth Removal     multiple     OB History    Gravida  2   Para  0   Term  0   Preterm  0   AB  1   Living        SAB  1   TAB  0   Ectopic  0   Multiple      Live Births               Home Medications    Prior to Admission medications   Medication Sig Start Date End Date Taking? Authorizing Provider  albuterol (PROVENTIL HFA;VENTOLIN HFA) 108 (90 Base) MCG/ACT inhaler Inhale 2 puffs into the lungs every 6 (six) hours as needed for wheezing or shortness of breath.    [provider]  EPINEPHrine 0.3 mg/0.3 mL IJ SOAJ injection Inject 0.3 mg into the muscle as needed (for allergic reaction).     [provider]  folic acid (FOLVITE) 1 MG tablet Take 1 tablet (1 mg total) by mouth daily. 01/08/18   Donnamae Jude, MD  hydrOXYzine (VISTARIL) 50 MG capsule Take 1 capsule (50 mg total) by mouth at bedtime. 02/06/18   Donnamae Jude, MD  levETIRAcetam (KEPPRA) 500 MG tablet Take 1 tablet (  500 mg total) by mouth 2 (two) times daily. 02/16/18   Kinnie Feil, PA-C  ondansetron (ZOFRAN ODT) 4 MG disintegrating tablet Take 1 tablet (4 mg total) by mouth every 8 (eight) hours as needed for nausea or vomiting. 01/26/18   Caccavale, Sophia, PA-C  Prenatal Multivit-Min-Fe-FA (PRENATAL VITAMINS) 0.8 MG tablet Take 1 tablet by mouth daily. 01/14/18   Donnamae Jude, MD    Family History Family History  Problem Relation Age of Onset  . Heart attack Mother   . Stroke Mother   . Liver cancer Mother   . Diabetes Mother   . Lung cancer Mother   . Alcoholism Father   . Sleep apnea Brother   . Depression Brother   . ADD / ADHD Brother   . Diabetes Maternal Aunt   . Diabetes Paternal Uncle   . Diabetes Maternal Grandmother     Social History Social History   Tobacco Use  . Smoking status:  Current Every Day Smoker    Packs/day: 0.25    Types: Cigarettes  . Smokeless tobacco: Never Used  Substance Use Topics  . Alcohol use: Not Currently    Frequency: Never  . Drug use: No     Allergies   Aspartame and phenylalanine; Benadryl [diphenhydramine]; Scallops [shellfish allergy]; Bee venom; Benadryl [diphenhydramine]; Other; Pollen extract; Scallops [shellfish allergy]; Tegretol [carbamazepine]; and Tegretol [carbamazepine]   Review of Systems Review of Systems  Constitutional: Negative for chills, diaphoresis, fatigue and fever.  HENT: Negative for congestion and trouble swallowing.   Eyes: Negative for visual disturbance.  Respiratory: Negative for cough, chest tightness, shortness of breath and wheezing.   Cardiovascular: Negative for chest pain.  Gastrointestinal: Positive for nausea and vomiting. Negative for abdominal pain, constipation and diarrhea.  Genitourinary: Positive for frequency. Negative for decreased urine volume, dysuria, flank pain, genital sores and urgency.  Musculoskeletal: Negative for back pain, neck pain and neck stiffness.  Skin: Negative for itching, rash and wound.  Neurological: Positive for numbness. Negative for tremors, syncope, speech difficulty, weakness, light-headedness and headaches.  Psychiatric/Behavioral: Negative for agitation and confusion.     Physical Exam Updated Vital Signs BP 108/67 (BP Location: Left Arm)   Pulse 62   Temp 98 F (36.7 C) (Oral)   Resp 18   LMP 11/07/2017 (Exact Date)   SpO2 100%   Physical Exam  Constitutional: She is oriented to person, place, and time. She appears well-developed and well-nourished. No distress.  HENT:  Head: Normocephalic and atraumatic.  Nose: Nose normal.  Mouth/Throat: Oropharynx is clear and moist. No oropharyngeal exudate.  Eyes: Pupils are equal, round, and reactive to light. Conjunctivae and EOM are normal.  Neck: Normal range of motion. Neck supple.  Cardiovascular:  Normal rate, regular rhythm and intact distal pulses.  No murmur heard. Pulmonary/Chest: Effort normal and breath sounds normal. No respiratory distress. She has no wheezes. She has no rales. She exhibits no tenderness.  Abdominal: Soft. She exhibits no mass. There is no tenderness. There is no rebound and no guarding.  Musculoskeletal: She exhibits no edema or tenderness.  Lymphadenopathy:    She has no cervical adenopathy.  Neurological: She is alert and oriented to person, place, and time. She displays normal reflexes. A sensory deficit is present. No cranial nerve deficit. She exhibits normal muscle tone. Coordination normal.  Skin: Skin is warm and dry. Capillary refill takes less than 2 seconds. No rash noted. She is not diaphoretic. No erythema.  Psychiatric: She has a normal  mood and affect.  Nursing note and vitals reviewed.    ED Treatments / Results  Labs (all labs ordered are listed, but only abnormal results are displayed) Labs Reviewed  CBC WITH DIFFERENTIAL/PLATELET - Abnormal; Notable for the following components:      Result Value   WBC 17.5 (*)    Neutro Abs 12.9 (*)    All other components within normal limits  COMPREHENSIVE METABOLIC PANEL - Abnormal; Notable for the following components:   CO2 21 (*)    BUN <5 (*)    Total Protein 6.3 (*)    Albumin 3.1 (*)    All other components within normal limits  URINALYSIS, ROUTINE W REFLEX MICROSCOPIC - Abnormal; Notable for the following components:   Color, Urine AMBER (*)    APPearance CLOUDY (*)    Ketones, ur 80 (*)    Protein, ur 30 (*)    Bacteria, UA FEW (*)    Squamous Epithelial / LPF >50 (*)    All other components within normal limits  URINE CULTURE  MAGNESIUM    EKG None  Radiology No results found.  Procedures Procedures (including critical care time)  EMERGENCY [name] [place] PREGNANCY "Study: Limited Ultrasound of the Pelvis for Pregnancy"  INDICATIONS:Pregnancy(required) and allergic  reaction Multiple views of the uterus and pelvic cavity were obtained in real-time with a multi-frequency probe.  APPROACH:Transabdominal  PERFORMED BY: Myself IMAGES ARCHIVED?: Yes LIMITATIONS: none FETAL HEART RATE: 150 INTERPRETATION: Fetal heart activity seen      Medications Ordered in ED Medications  sodium chloride 0.9 % bolus 1,000 mL (0 mLs Intravenous Stopped 04/04/18 1912)     Initial Impression / Assessment and Plan / ED Course  I have reviewed the triage vital signs and the nursing notes.  Pertinent labs & imaging results that were available during my care of the patient were reviewed by me and considered in my medical decision making (see chart for details).     Amanda [name] is a 37 y.o. female who is [redacted] weeks pregnant with a past medical history significant for bipolar disorder, borderline personality, anxiety, asthma, seizure disorder on Keppra, and prior urinary tract infection who presents for bee sting.  Patient reports that yesterday she was stung on her right elbow by a bee.  She reports that she has a history of bee allergy but did not have her EpiPen.  Thus she reports she used the filter on a cigarette, mixed with mild, and put on the sting site.  She reports that she had heard that this can help if you do not have epinephrine.  She reports that she did not have any throat swelling, tongue swelling, stridor, or difficulty breathing.  She denies developing any rash, itching or lightheadedness.  She does report she had some nausea and vomiting but otherwise had no infectious symptoms.  She reports that she did well overnight aside from several seizure episodes.  She reports she frequently has seizures.  She says that she has had some urinary frequency recently but reports the dysuria she had with prior UTI resolved.  She denies any abdominal pain, abdominal cramping, vaginal symptoms, constipation, or diarrhea.  She denies any trauma.  She reports she has had decreased  oral intake over the last 2 days with some nausea and has not been drinking water.  She reports that after seizure she occasionally gets numbness and has had some numbness in the right face and right arm.  Documentation shows that she has had  this in the past.  On exam, patient had normal grip strength bilaterally.  She reported faint tingling in the right face and right arm.  Patient had normal leg strength and sensation in the legs.  Patient's lungs were clear and chest was nontender.  Abdomen is nontender.  Abdomen ultrasound showed fetal heart rate of 150.  Baby was moving and patient reports feeling fetal movements normally.  Patient's exam was otherwise unremarkable.  Clinical suspect patient is slightly dehydrated.  She will be given more fluids.  I do not suspect patient is having current anaphylaxis reaction given her well appearance and time without worsening symptoms since the sting.  Patient will be given fluids for the likely dehydration and will try to check given the tingling.  Given her similar history, do not feel patient needs CT imaging at this time especially given her pregnant status.  Shared decision in conversation will be held for further management however I suspect after fluids, patient will likely be stable for discharge home with EpiPen prescription.  We will also check urinalysis given her history of UTI and the current urinary frequency.  9:25 PM Patient was observed for several hours with no worsened allergic reaction symptoms.  Patient received fluids and was feeling better.  Patient continues to have some subjective tingling, suspect possible component of a Todd's paralysis or paresthesia after her seizure.  Patient's laboratory testing showed continued leukocytosis but otherwise no anemia or signs of infection.  Given her pregnancy and history of similar symptoms after seizures, I do not feel she needs head CT at this time given her lack of other worsening.  If any symptoms  change or worsen, patient may need CT head on return.  Patient will give prescription for EpiPen and will follow-up with her OB/GYN PCP.  Patient had no other questions or concerns and was discharged in good condition.  Final Clinical Impressions(s) / ED Diagnoses   Final diagnoses:  Dehydration  Bee sting, accidental or unintentional, initial encounter  Tingling    ED Discharge Orders         Ordered    EPINEPHrine 0.3 mg/0.3 mL IJ SOAJ injection   Once     04/04/18 2128          Clinical Impression: 1. Dehydration   2. Bee sting, accidental or unintentional, initial encounter   3. Tingling     Disposition: Discharge  Condition: Good  I have discussed the results, Dx and Tx plan with the pt(& family if present). He/she/they expressed understanding and agree(s) with the plan. Discharge instructions discussed at great length. Strict return precautions discussed and pt &/or family have verbalized understanding of the instructions. No further questions at time of discharge.    New Prescriptions   EPINEPHRINE 0.3 MG/0.3 ML IJ SOAJ INJECTION    Inject 0.3 mLs (0.3 mg total) into the muscle once for 1 dose.    Follow Up: Lois Huxley, [place] [address] [phone]     [facility] [place] [phone] Schedule an appointment as soon as possible for a visit    [place] [address] [phone] mc [place]       Sheral Pfahler, Gwenyth Allegra, MD 04/05/18 [phone]

## 2018-04-04 NOTE — ED Triage Notes (Signed)
Pt states she was stung by a bee last night. States she is allergic. She lost her epi pen but she treated it by putting nicotine on it. She denies SOB or Hives but reports feeling numbness in her lips and extremities. Pt also reports an episode of vomiting and multiple seizures last night. She also states she is [redacted] weeks pregnant.

## 2018-04-04 NOTE — Discharge Instructions (Signed)
Your work-up today was overall reassuring.  We did not find evidence of infection.  Please stay hydrated as we suspect you are slightly dehydrated.  He did not have any worsening allergic reaction symptoms however given your history, we will give you prescription for EpiPen.  If any symptoms return, change, or worsen, please return to the nearest emergency department.

## 2018-04-05 LAB — URINE CULTURE: Culture: 50000 — AB

## 2018-04-06 ENCOUNTER — Telehealth: Payer: Self-pay

## 2018-04-06 NOTE — Telephone Encounter (Signed)
Post ED Visit - Positive Culture Follow-up: Unsuccessful Patient Follow-up  Culture assessed and recommendations reviewed by:  []  Elenor Quinones, Pharm.D. []  Heide Guile, Pharm.D., BCPS AQ-ID []  Parks Neptune, Pharm.D., BCPS []  Alycia Rossetti, Pharm.D., BCPS []  [place], [place].D., BCPS, AAHIVP []  Legrand Como, Pharm.D., BCPS, AAHIVP []  Wynell Balloon, PharmD []  Vincenza Hews, PharmD, BCPS Hailey Bard Pharm D Positive urine culture  [x]  Patient discharged without antimicrobial prescription and treatment is now indicated []  Organism is resistant to prescribed ED discharge antimicrobial []  Patient with positive blood cultures   Unable to contact patient after 3 attempts, letter will be sent to address on file  Amanda [name] 04/06/2018, 11:10 AM

## 2018-04-06 NOTE — Progress Notes (Signed)
ED Antimicrobial Stewardship Positive Culture Follow Up   Amanda [name] is an 37 y.o. female who presented to [facility] on 04/04/2018 with a chief complaint of  Chief Complaint  Patient presents with  . Insect Bite    Recent Results (from the past 720 hour(s))  Urine culture     Status: Abnormal   Collection Time: 04/04/18  6:57 PM  Result Value Ref Range Status   Specimen Description URINE, CLEAN CATCH  Final   Special Requests NONE  Final   Culture (A)  Final    50,000 COLONIES/mL CORYNEBACTERIUM SPECIES Standardized susceptibility testing for this organism is not available. Performed at [facility], [place] [address], [place], [place] [address]    Report Status 04/05/2018 FINAL  Final    [x]  Patient discharged originally without antimicrobial agent and treatment is now indicated  New antibiotic prescription: Augmentin 875mg  PO BID x 7 days  ED Provider: Lenn Sink, PA-C   [name] 04/06/2018, 9:34 AM Clinical Pharmacist Monday - Friday phone -  [phone] Saturday - Sunday phone - [phone]

## 2018-04-09 ENCOUNTER — Ambulatory Visit (INDEPENDENT_AMBULATORY_CARE_PROVIDER_SITE_OTHER): Payer: Medicare (Managed Care) | Admitting: Obstetrics and Gynecology

## 2018-04-09 ENCOUNTER — Ambulatory Visit: Payer: Self-pay | Admitting: Clinical

## 2018-04-09 VITALS — BP 125/67 | HR 66 | Wt 205.0 lb

## 2018-04-09 DIAGNOSIS — O09512 Supervision of elderly primigravida, second trimester: Secondary | ICD-10-CM

## 2018-04-09 DIAGNOSIS — F251 Schizoaffective disorder, depressive type: Secondary | ICD-10-CM

## 2018-04-09 DIAGNOSIS — F603 Borderline personality disorder: Secondary | ICD-10-CM

## 2018-04-09 DIAGNOSIS — R87810 Cervical high risk human papillomavirus (HPV) DNA test positive: Secondary | ICD-10-CM

## 2018-04-09 DIAGNOSIS — O2342 Unspecified infection of urinary tract in pregnancy, second trimester: Secondary | ICD-10-CM

## 2018-04-09 DIAGNOSIS — F3181 Bipolar II disorder: Secondary | ICD-10-CM

## 2018-04-09 DIAGNOSIS — G40909 Epilepsy, unspecified, not intractable, without status epilepticus: Secondary | ICD-10-CM

## 2018-04-09 DIAGNOSIS — O0990 Supervision of high risk pregnancy, unspecified, unspecified trimester: Secondary | ICD-10-CM

## 2018-04-09 DIAGNOSIS — O99352 Diseases of the nervous system complicating pregnancy, second trimester: Secondary | ICD-10-CM

## 2018-04-09 NOTE — BH Specialist Note (Signed)
Integrated Behavioral Health Follow Up Visit  MRN: [id number] Name: Amanda [name]  Number of [place] Clinician visits: 2/6 Session Start time: 5:05  Session End time: 5:17 Total time: 12 minutes  Type of Service: [place] Interpretor:No. Interpretor Name and Language: n/a  SUBJECTIVE: Amanda [name] is a 37 y.o. female accompanied by n/a Patient was referred by Vivien Rota, MD for bipolar 2 disorder. Patient reports the following symptoms/concerns: Pt states her primary concern today is a need to talk about her pregnancy and relationship issues.  Duration of problem: Current pregnancy; Severity of problem: moderate  OBJECTIVE: Mood: Anxious and Affect: Appropriate Risk of harm to self or others: No plan to harm self or others  LIFE CONTEXT: Family and Social: Pt lives with fiancee in [place]; guest of [facility] School/Work: - Self-Care: Taking medications as prescribed Life Changes: Current pregnancy; unstable housing  GOALS ADDRESSED:n/a INTERVENTIONS: Interventions utilized:  Supportive Counseling Standardized Assessments completed: GAD-7 and PHQ 9  ASSESSMENT: Patient currently experiencing Bipolar 2 affective disorder  Patient may benefit from supportive counseling today.  PLAN: 1. Follow up with behavioral health clinician on : As needed 2. Behavioral recommendations:  -Continue attending therapy sessions at [facility] taking Keppra and Vistaril, as prescribed by medial provider -Continue working with local agencies to find more permanent housing 3. Referral(s): [place] (In Clinic) 4. "From scale of 1-10, how likely are you to follow plan?": -  Garlan Fair, LCSW  Depression screen [facility] 2/9 04/09/2018 03/06/2018 02/06/2018 01/08/2018  Decreased Interest 1 0 0 0  Down, Depressed, Hopeless 0 2 - 0  PHQ - 2 Score 1 2 0 0  Altered sleeping 1 2 2 1   Tired,  decreased energy 1 2 0 3  Change in appetite 0 1 0 0  Feeling bad or failure about yourself  0 0 0 0  Trouble concentrating 0 0 0 0  Moving slowly or fidgety/restless 0 1 0 0  Suicidal thoughts 0 0 0 0  PHQ-9 Score 3 8 2 4   Difficult doing work/chores - Somewhat difficult - -   GAD 7 : Generalized Anxiety Score 04/09/2018 03/06/2018 02/06/2018 01/08/2018  Nervous, Anxious, on Edge 1 2 0 1  Control/stop worrying 1 2 1 1   Worry too much - different things 1 2 1 1   Trouble relaxing 1 1 1 1   Restless 1 1 0 0  Easily annoyed or irritable 1 2 1 1   Afraid - awful might happen 0 2 1 1   Total GAD 7 Score 6 12 5 6   Anxiety Difficulty - Somewhat difficult - -

## 2018-04-09 NOTE — Patient Instructions (Addendum)
Places to have your son circumcised:    [facility] [phone] while you are in hospital  [facility] [phone] $244 by 4 wks  [facility] [phone] $175 by 2 wks  [facility] [phone] $250 by 7 days [facility] [phone] $269 by 4 wks  These prices sometimes change but are roughly what you can expect to pay. Please call and confirm pricing.   Circumcision is considered an elective/non-medically necessary procedure. There are many reasons parents decide to have their sons circumsized. During the first year of life circumcised males have a reduced risk of urinary tract infections but after this year the rates between circumcised males and uncircumcised males are the same.  It is safe to have your son circumcised outside of the hospital and the places above perform them regularly.   Deciding about Circumcision in Baby Boys  (Up-to-date The Basics)  What is circumcision?  Circumcision is a surgery that removes the skin that covers the tip of the penis, called the "foreskin" Circumcision is usually done when a boy is between 81 and 17 days old. In the [place], circumcision is common. In some other countries, fewer boys are circumcised. Circumcision is a common tradition in some religions.  Should I have my baby boy circumcised?  There is no easy answer. Circumcision has some benefits. But it also has risks. After talking with your doctor, you will have to decide for yourself what is right for your family.  What are the benefits of circumcision?  Circumcised boys seem to have slightly lower rates of: ?Urinary tract infections ?Swelling of the opening at the tip of the penis Circumcised men seem to have slightly lower rates of: ?Urinary tract infections ?Swelling of the opening at the tip of the penis ?Penis  cancer ?HIV and other infections that you catch during sex ?Cervical cancer in the women they have sex with Even so, in the [place], the risks of these problems are small - even in boys and men who have not been circumcised. Plus, boys and men who are not circumcised can reduce these extra risks by: ?Cleaning their penis well ?Using condoms during sex  What are the risks of circumcision?  Risks include: ?Bleeding or infection from the surgery ?Damage to or amputation of the penis ?A chance that the doctor will cut off too much or not enough of the foreskin ?A chance that sex won't feel as good later in life Only about 1 out of every 200 circumcisions leads to problems. There is also a chance that your health insurance won't pay for circumcision.  How is circumcision done in baby boys?  First, the baby gets medicine for pain relief. This might be a cream on the skin or a shot into the base of the penis. Next, the doctor cleans the baby's penis well. Then he or she uses special tools to cut off the foreskin. Finally, the doctor wraps a bandage (called gauze) around the baby's penis. If you have your baby circumcised, his doctor or nurse will give you instructions on how to care for him after the surgery. It is important that you follow those instructions carefully.   Childbirth Education Options: [facility] Department Classes:  Childbirth education classes can help you get ready for a positive parenting experience. You can also meet other expectant parents and get free stuff for your baby. Each class runs for five weeks on the same night and costs $45 for the mother-to-be and her support person. Medicaid covers  the cost if you are eligible. Call [phone] to register. [facility] Childbirth Education:  [phone] or [phone] or [email or web address]  Baby & Me Class: Discuss newborn & infant parenting and family adjustment issues with other new mothers in  a relaxed environment. Each week brings a new speaker or baby-centered activity. We encourage new mothers to join [place] every Thursday at 11:00am. Babies birth until crawling. No registration or fee. Daddy [employer]: This course offers Dads-to-be the tools and knowledge needed to feel confident on their journey to becoming new fathers. Experienced dads, who have been trained as coaches, teach dads-to-be how to hold, comfort, diaper, swaddle and play with their infant while being able to support the new mom as well. A class for men taught by men. $25/dad Big Brother/Big Sister: Let your children share in the joy of a new brother or sister in this special class designed just for them. Class includes discussion about how families care for babies: swaddling, holding, diapering, safety as well as how they can be helpful in their new role. This class is designed for children ages 70 to 25, but any age is welcome. Please register each child individually. $5/child  Mom Talk: This mom-led group offers support and connection to mothers as they journey through the adjustments and struggles of that sometimes overwhelming first year after the birth of a child. Tuesdays at 10:00am and Thursdays at 6:00pm. Babies welcome. No registration or fee. Breastfeeding Support Group: This group is a mother-to-mother support circle where moms have the opportunity to share their breastfeeding experiences. A Lactation Consultant is present for questions and concerns. Meets each Tuesday at 11:00am. No fee or registration. Breastfeeding Your Baby: Learn what to expect in the first days of breastfeeding your newborn.  This class will help you feel more confident with the skills needed to begin your breastfeeding experience. Many new mothers are concerned about breastfeeding after leaving the hospital. This class will also address the most common fears and challenges about breastfeeding during the first few weeks, months and beyond. (call for  fee) Comfort Techniques and Tour: This 2 hour interactive class will provide you the opportunity to learn & practice hands-on techniques that can help relieve some of the discomfort of labor and encourage your baby to rotate toward the best position for birth. You and your partner will be able to try a variety of labor positions with birth balls and rebozos as well as practice breathing, relaxation, and visualization techniques. A tour of the [facility] is included with this class. $20 per registrant and support person Childbirth Class- Weekend Option: This class is a Weekend version of our Birth & Baby series. It is designed for parents who have a difficult time fitting several weeks of classes into their schedule. It covers the care of your newborn and the basics of labor and childbirth. It also includes a [place] [facility] and lunch. The class is held two consecutive days: beginning on Friday evening from 6:30 - 8:30 p.m. and the next day, Saturday from 9 a.m. - 4 p.m. (call for fee) Doren Custard Class: Interested in a waterbirth?  This informational class will help you discover whether waterbirth is the right fit for you. Education about waterbirth itself, supplies you would need and how to assemble your support team is what you can expect from this class. Some obstetrical practices require this class in order to pursue a waterbirth. (Not all obstetrical practices offer waterbirth-check with your  healthcare provider.) Register only the expectant mom, but you are encouraged to bring your partner to class! Required if planning waterbirth, no fee. Infant/Child CPR: Parents, grandparents, babysitters, and friends learn Cardio-Pulmonary Resuscitation skills for infants and children. You will also learn how to treat both conscious and unconscious choking in infants and children. This Family & Friends program does not offer certification. Register each  participant individually to ensure that enough mannequins are available. (Call for fee) Grandparent Love: Expecting a grandbaby? This class is for you! Learn about the latest infant care and safety recommendations and ways to support your own child as he or she transitions into the parenting role. Taught by Registered Nurses who are childbirth instructors, but most importantly...they are grandmothers too! $10/person. Childbirth Class- Natural Childbirth: This series of 5 weekly classes is for expectant parents who want to learn and practice natural methods of coping with the process of labor and childbirth. Relaxation, breathing, massage, visualization, role of the partner, and helpful positioning are highlighted. Participants learn how to be confident in their body's ability to give birth. This class will empower and help parents make informed decisions about their own care. Includes discussion that will help new parents transition into the immediate postpartum period. [facility] is included. We suggest taking this class between 25-32 weeks, but it's only a recommendation. $75 per registrant and one support person or $30 Medicaid. Childbirth Class- 3 week Series: This option of 3 weekly classes helps you and your labor partner prepare for childbirth. Newborn care, labor & birth, cesarean birth, pain management, and comfort techniques are discussed and a [place] [facility] is included. The class meets at the same time, on the same day of the week for 3 consecutive weeks beginning with the starting date you choose. $60 for registrant and one support person.  Marvelous Multiples: Expecting twins, triplets, or more? This class covers the differences in labor, birth, parenting, and breastfeeding issues that face multiples' parents. NICU tour is included. Led by a Certified Childbirth Educator who is the mother of twins. No fee. Caring for Baby: This  class is for expectant and adoptive parents who want to learn and practice the most up-to-date newborn care for their babies. Focus is on birth through the first six weeks of life. Topics include feeding, bathing, diapering, crying, umbilical cord care, circumcision care and safe sleep. Parents learn to recognize symptoms of illness and when to call the pediatrician. Register only the mom-to-be and your partner or support person can plan to come with you! $10 per registrant and support person Childbirth Class- online option: This online class offers you the freedom to complete a Birth and Baby series in the comfort of your own home. The flexibility of this option allows you to review sections at your own pace, at times convenient to you and your support people. It includes additional video information, animations, quizzes, and extended activities. Get organized with helpful eClass tools, checklists, and trackers. Once you register online for the class, you will receive an email within a few days to accept the invitation and begin the class when the time is right for you. The content will be available to you for 60 days. $60 for 60 days of online access for you and your support people.  Local Doulas: Natural Baby Doulas [email or web address] Tel: [phone] [email or web address]/ [employer] [phone] [email or web address].[email or web address] The Labor [name] Halim  (also do waterbirth tub rental) [phone] [email or web address] Triad Birth Doula  [phone] [email or web address] NotebookDistributors.fi [facility] Rhythms  [phone] https://[email or web address]-[email or web address]/ [employer] (PADA) [email or web address] https://www.[email or web address]/ [place] Birth and Baby  http://[email or web address]/ Considering Waterbirth? Guide for patients at [facility] [employer]  Why consider  waterbirth?  . Gentle birth for babies . Less pain medicine used in labor . May allow for passive descent/less pushing . May reduce perineal tears  . More mobility and instinctive maternal position changes . Increased maternal relaxation . Reduced blood pressure in labor  Is waterbirth safe? What are the risks of infection, drowning or other complications?  . Infection: o Very low risk (3.7 % for tub vs 4.8% for bed) o 7 in 8000 waterbirths with documented infection o Poorly cleaned equipment most common cause o Slightly lower group B strep transmission rate  . Drowning o Maternal:  - Very low risk   - Related to seizures or fainting o Newborn:  - Very low risk. No evidence of increased risk of respiratory problems in multiple large studies - Physiological protection from breathing under water - Avoid underwater birth if there are any fetal complications - Once baby's head is out of the water, keep it out.  . Birth complication o Some reports of cord trauma, but risk decreased by bringing baby to surface gradually o No evidence of increased risk of shoulder dystocia. Mothers can usually change positions faster in water than in a bed, possibly aiding the maneuvers to free the shoulder.   You must attend a Doren Custard class at [facility]  3rd Wednesday of every month from 7-9pm  [employer] by calling [phone] or online at [email or web address]  Bring [place] the certificate from the class to your prenatal appointment  Meet with a midwife at 36 weeks to see if you can still plan a waterbirth and to sign the consent.   Purchase or rent the following supplies:   Water Birth Pool (Birth Pool in a Box or [place] for instance)  (Tubs start ~$125)  Single-use disposable tub liner designed for your brand of tub  New garden hose labeled "lead-free", "suitable for drinking water",  Electric drain pump to remove water (We recommend 792 gallon per hour or greater  pump.)   Separate garden hose to remove the dirty water  Fish net  Bathing suit top (optional)  Long-handled mirror (optional)  Places to purchase or rent supplies  GotWebTools.is for tub purchases and supplies  Waterbirthsolutions.com for tub purchases and supplies  The Labor Ladies (www.thelaborladies.com) $275 for tub rental/set-up & take down/kit   [employer] (http://www.fleming.com/.htm) Information regarding doulas (labor support) who provide pool rentals  Our practice has a Birth Pool in a Box tub at the hospital that you may borrow on a first-come-first-served basis. It is your responsibility to to set up, clean and break down the tub. We cannot guarantee the availability of this tub in advance. You are responsible for bringing all accessories listed above. If you do not have all necessary supplies you cannot have a waterbirth.    Things that would prevent you from having a waterbirth:  Premature, <37wks  Previous cesarean birth  Presence of thick meconium-stained fluid  Multiple gestation (Twins, triplets, etc.)  Uncontrolled diabetes or gestational diabetes requiring medication  Hypertension requiring medication or diagnosis of pre-eclampsia  Heavy vaginal bleeding  Non-reassuring fetal heart rate  Active infection (MRSA, etc.). Group B Strep is NOT a contraindication for  waterbirth.  If your labor has to be induced and induction method requires continuous  monitoring  of the baby's heart rate  Other risks/issues identified by your obstetrical provider  Please remember that birth is unpredictable. Under certain unforeseeable circumstances your provider may advise against giving birth in the tub. These decisions will be made on a case-by-case basis and with the safety of you and your baby as our highest priority.   AREA PEDIATRIC/FAMILY [place] [address]  [place], [place]  [address] Phone - [phone]   Fax - [phone]  [facility] [address] [place], [place] [address] Phone - [phone]   Fax - [place] [address] [place], [place]  [address] Phone - [phone]   Fax - [phone]  [place]. [address] [place] [address] [place], [place]  [address] Phone - [phone]   Fax - [phone]  [place] [address] [place], [place]  [address] Phone - [phone]   Fax - [phone]  [facility] [address] [place], [place]  [address] Phone - [phone]   Fax - [place] [address], [place], [place]  [address] Phone - [phone]   Fax - [phone]  [place] [address] [place], [place] [address] [place], [place]  [address] Phone - [phone]   Fax - [place] [address] [place], [place]  [address] Phone - [phone]   Fax - [phone] [facility] [place]. [address] [place], [place]  [address] Phone - [phone]   Fax - [phone]  [place] [address] [place], [place]  [address] Phone - [phone]   Fax - [phone]  [facility] AT [place], [place], [place]  [address] Phone - [phone]   Fax - [place] [address] [place], [place]  [address] Phone - [phone]   Fax - [phone]  [facility] [address] [place], [place]  [address] Phone - [place], [place]  [address] Phone - [phone]   Fax - [place] [address] [place], [place]  [address] Phone - [phone]   Fax - [phone]  [place] [address] [place], [place]  [address] Phone - [phone]   Fax - [place]. [place], [place]  [address] Phone - [phone]   Fax - [place], [place], [place]  [address] Phone - [phone]   Fax - [place] [address], [place], [place]  [address] Phone - [phone]   Fax - [phone]  [place] [address] [place], [place]  [address] Phone - [phone]   Fax - [place] [address] [place], [place]  [address] Phone - [phone]   Fax - [phone]  [place] [address] [place], [place]  [address] Phone - [phone]   Fax - [phone] [place] [address] [place], [place]  [address] Phone - [phone]   Fax - [place] [address] [place], [place]  [address] Phone - [phone]   Fax - [phone]  [place] [address] [place], [place]  [address] Phone - [phone]   Fax - [place] Carma Leaven MD [address] [place] [address] Phone [phone]  Fax [phone]

## 2018-04-09 NOTE — Progress Notes (Signed)
   PRENATAL VISIT NOTE  Subjective:  Amanda [name] is a 37 y.o. G2P0010 at [redacted]w[redacted]d being seen today for ongoing prenatal care.  She is currently monitored for the following issues for this high-risk pregnancy and has Schizoaffective disorder, depressive type ([place]); Seizure disorder ([place]); Supervision of high risk pregnancy, antepartum; Seizure disorder during pregnancy ([place]); Advanced maternal age, primigravida; Bipolar 2 disorder ([place]); Borderline personality disorder ([place]); Major depression, chronic; PTSD (post-traumatic stress disorder); OCD (obsessive compulsive disorder); Asthma; Pap smear of cervix shows high risk HPV present; and UTI in pregnancy on their problem list.  Patient reports had bee sting last weekend, otherwise feeling well.  Contractions: Not present. Vag. Bleeding: None.  Movement: Present. Denies leaking of fluid.   The following portions of the patient's history were reviewed and updated as appropriate: allergies, current medications, past family history, past medical history, past social history, past surgical history and problem list. Problem list updated.  Objective:   Vitals:   04/09/18 1626  BP: 125/67  Pulse: 66  Weight: 205 lb (93 kg)    Fetal Status: Fetal Heart Rate (bpm): 156   Movement: Present     General:  Alert, oriented and cooperative. Patient is in no acute distress.  Skin: Skin is warm and dry. No rash noted.   Cardiovascular: Normal heart rate noted  Respiratory: Normal respiratory effort, no problems with respiration noted  Abdomen: Soft, gravid, appropriate for gestational age.  Pain/Pressure: Present     Pelvic: Cervical exam deferred        Extremities: Normal range of motion.  Edema: Trace  Mental Status: Normal mood and affect. Normal behavior. Normal judgment and thought content.   Assessment and Plan:  Pregnancy: G2P0010 at [redacted]w[redacted]d  1. Seizure disorder during pregnancy in second trimester ([place]) On keppra 500 mg BID  2. Primigravida of  advanced maternal age in second trimester  3. Supervision of high risk pregnancy, antepartum  4. Bipolar 2 disorder (H[facility]) On keppra 500 mg BID To see LCSW today  5. Borderline personality disorder ([place])  6. Pap smear of cervix shows high risk HPV present Repeat pap pp  7. Schizoaffective disorder, depressive type ([place])  8. Urinary tract infection in mother during second trimester of pregnancy Positive culture for corynebacterium in ED, ? Contaminant Straight cath urine taken today for culture   Preterm labor symptoms and general obstetric precautions including but not limited to vaginal bleeding, contractions, leaking of fluid and fetal movement were reviewed in detail with the patient. Please refer to After Visit Summary for other counseling recommendations.  Return in about 4 weeks (around 05/07/2018) for OB visit (MD).  Future Appointments  Date Time Provider [place]  04/17/2018  3:00 PM [place] 3 WH-MFCUS MFC-US  05/08/2018  3:35 PM Truett [place], DO WOC-WOCA WOC    Sloan Leiter, MD

## 2018-04-10 ENCOUNTER — Encounter: Payer: Self-pay | Admitting: Obstetrics and Gynecology

## 2018-04-11 LAB — URINE CULTURE, OB REFLEX: ORGANISM ID, BACTERIA: NO GROWTH

## 2018-04-11 LAB — CULTURE, OB URINE

## 2018-04-17 ENCOUNTER — Ambulatory Visit (HOSPITAL_COMMUNITY)
Admission: RE | Admit: 2018-04-17 | Discharge: 2018-04-17 | Disposition: A | Discharge status: Home / Self Care | Payer: Medicare (Managed Care) | Source: Ambulatory Visit | Attending: Obstetrics and Gynecology | Admitting: Obstetrics and Gynecology

## 2018-04-17 ENCOUNTER — Encounter (HOSPITAL_COMMUNITY): Payer: Self-pay

## 2018-04-17 DIAGNOSIS — G40909 Epilepsy, unspecified, not intractable, without status epilepticus: Secondary | ICD-10-CM | POA: Diagnosis not present

## 2018-04-17 DIAGNOSIS — O0990 Supervision of high risk pregnancy, unspecified, unspecified trimester: Secondary | ICD-10-CM

## 2018-04-17 DIAGNOSIS — O321XX0 Maternal care for breech presentation, not applicable or unspecified: Secondary | ICD-10-CM | POA: Insufficient documentation

## 2018-04-17 DIAGNOSIS — O99352 Diseases of the nervous system complicating pregnancy, second trimester: Secondary | ICD-10-CM | POA: Insufficient documentation

## 2018-04-17 DIAGNOSIS — O09512 Supervision of elderly primigravida, second trimester: Secondary | ICD-10-CM

## 2018-04-17 DIAGNOSIS — Z3A23 23 weeks gestation of pregnancy: Secondary | ICD-10-CM | POA: Diagnosis not present

## 2018-04-17 DIAGNOSIS — F319 Bipolar disorder, unspecified: Secondary | ICD-10-CM | POA: Diagnosis not present

## 2018-04-17 DIAGNOSIS — O99342 Other mental disorders complicating pregnancy, second trimester: Secondary | ICD-10-CM | POA: Insufficient documentation

## 2018-04-17 DIAGNOSIS — O09522 Supervision of elderly multigravida, second trimester: Secondary | ICD-10-CM | POA: Insufficient documentation

## 2018-04-17 DIAGNOSIS — Z362 Encounter for other antenatal screening follow-up: Secondary | ICD-10-CM | POA: Insufficient documentation

## 2018-04-18 ENCOUNTER — Other Ambulatory Visit (HOSPITAL_COMMUNITY): Payer: Self-pay | Admitting: *Deleted

## 2018-04-18 DIAGNOSIS — G40909 Epilepsy, unspecified, not intractable, without status epilepticus: Secondary | ICD-10-CM

## 2018-04-18 DIAGNOSIS — O99352 Diseases of the nervous system complicating pregnancy, second trimester: Principal | ICD-10-CM

## 2018-05-05 ENCOUNTER — Encounter (HOSPITAL_COMMUNITY): Payer: Self-pay

## 2018-05-05 ENCOUNTER — Inpatient Hospital Stay (HOSPITAL_COMMUNITY)
Admission: AD | Admit: 2018-05-05 | Discharge: 2018-05-05 | Disposition: A | Discharge status: Home / Self Care | Payer: Medicare (Managed Care) | Attending: Obstetrics and Gynecology | Admitting: Obstetrics and Gynecology

## 2018-05-05 DIAGNOSIS — O368120 Decreased fetal movements, second trimester, not applicable or unspecified: Secondary | ICD-10-CM | POA: Diagnosis not present

## 2018-05-05 DIAGNOSIS — F319 Bipolar disorder, unspecified: Secondary | ICD-10-CM | POA: Insufficient documentation

## 2018-05-05 DIAGNOSIS — Z3689 Encounter for other specified antenatal screening: Secondary | ICD-10-CM

## 2018-05-05 DIAGNOSIS — O99342 Other mental disorders complicating pregnancy, second trimester: Secondary | ICD-10-CM | POA: Diagnosis not present

## 2018-05-05 DIAGNOSIS — O99332 Smoking (tobacco) complicating pregnancy, second trimester: Secondary | ICD-10-CM | POA: Diagnosis not present

## 2018-05-05 DIAGNOSIS — F603 Borderline personality disorder: Secondary | ICD-10-CM | POA: Insufficient documentation

## 2018-05-05 DIAGNOSIS — G40909 Epilepsy, unspecified, not intractable, without status epilepticus: Secondary | ICD-10-CM | POA: Diagnosis not present

## 2018-05-05 DIAGNOSIS — Z3A25 25 weeks gestation of pregnancy: Secondary | ICD-10-CM | POA: Diagnosis not present

## 2018-05-05 DIAGNOSIS — O99512 Diseases of the respiratory system complicating pregnancy, second trimester: Secondary | ICD-10-CM | POA: Insufficient documentation

## 2018-05-05 DIAGNOSIS — R030 Elevated blood-pressure reading, without diagnosis of hypertension: Secondary | ICD-10-CM | POA: Insufficient documentation

## 2018-05-05 DIAGNOSIS — O26892 Other specified pregnancy related conditions, second trimester: Secondary | ICD-10-CM | POA: Insufficient documentation

## 2018-05-05 DIAGNOSIS — J45909 Unspecified asthma, uncomplicated: Secondary | ICD-10-CM | POA: Insufficient documentation

## 2018-05-05 DIAGNOSIS — O99352 Diseases of the nervous system complicating pregnancy, second trimester: Secondary | ICD-10-CM | POA: Insufficient documentation

## 2018-05-05 DIAGNOSIS — F429 Obsessive-compulsive disorder, unspecified: Secondary | ICD-10-CM | POA: Insufficient documentation

## 2018-05-05 DIAGNOSIS — F4312 Post-traumatic stress disorder, chronic: Secondary | ICD-10-CM | POA: Diagnosis not present

## 2018-05-05 DIAGNOSIS — Z79899 Other long term (current) drug therapy: Secondary | ICD-10-CM | POA: Diagnosis not present

## 2018-05-05 DIAGNOSIS — M069 Rheumatoid arthritis, unspecified: Secondary | ICD-10-CM | POA: Insufficient documentation

## 2018-05-05 DIAGNOSIS — O212 Late vomiting of pregnancy: Secondary | ICD-10-CM | POA: Diagnosis not present

## 2018-05-05 DIAGNOSIS — M7989 Other specified soft tissue disorders: Secondary | ICD-10-CM | POA: Diagnosis present

## 2018-05-05 LAB — URINALYSIS, ROUTINE W REFLEX MICROSCOPIC
BILIRUBIN URINE: NEGATIVE
Bacteria, UA: NONE SEEN
GLUCOSE, UA: NEGATIVE mg/dL
Hgb urine dipstick: NEGATIVE
KETONES UR: NEGATIVE mg/dL
Leukocytes, UA: NEGATIVE
NITRITE: NEGATIVE
PH: 6 (ref 5.0–8.0)
Protein, ur: 30 mg/dL — AB
Specific Gravity, Urine: 1.014 (ref 1.005–1.030)

## 2018-05-05 NOTE — MAU Provider Note (Signed)
History     CSN: [id number]  Arrival date and time: 05/05/18 2118   First Provider Initiated Contact with Patient 05/05/18 2209      Chief Complaint  Patient presents with  . Decreased Fetal Movement  . Leg Swelling   HPI  Amanda [name] is a 37 y.o. G2P0010 at [redacted]w[redacted]d who presents to MAU via EMS with multiple complaints:  Decreased fetal movement This is a new problem. Patient states she was shopping at [facility] earlier this evening and realized she had not felt her baby move in 10-15 minutes. Endorses drinking cold water to induce movement without success.  Elevated BP This is a new problem, onset earlier today. Patient states she took her blood pressure earlier today and it was "really high but I can't remember the numbers". Endorses intermittent spots in her field of vision. Denies RUQ pain. Denies SOB, chest pain, tingling in extremities.  Swelling of lower extremities This is a recurring problem, onset within the past two weeks but "noticably worse" today. Patient states she has noticed bilateral swelling in her legs but feels that her left leg is significantly more swollen from the knee down.  Nausea/Vomiting This is a new problem, onset today. Patient reports 4 episodes of dry heaving today with intermittent episodes of vomiting. States she has previously been able to tolerate PO nutrition and hydration without difficulty.  OB History    Gravida  2   Para  0   Term  0   Preterm  0   AB  1   Living  0     SAB  1   TAB  0   Ectopic  0   Multiple      Live Births              Past Medical History:  Diagnosis Date  . Anxiety   . Asthma   . Asthma due to environmental allergies   . Asthma due to seasonal allergies   . Bipolar 1 disorder ([place])   . Borderline personality disorder ([place])   . Brain bleed ([place])   . Chronic post-traumatic stress disorder (PTSD)    complex chronic with psychotic features and self harm behaviors  . Dander (animal) allergy     . Heart murmur   . Hip dysplasia   . Major depression, chronic   . Mood disorder ([place])   . OCD (obsessive compulsive disorder)   . Rheumatoid arthritis ([place])   . Seizure disorder ([place])   . Suicidal ideations   . Suicide attempt [facility])     Past Surgical History:  Procedure Laterality Date  . EYE SURGERY  1985  . SHUNT REMOVAL  1983  . Tooth Removal     multiple    Family History  Problem Relation Age of Onset  . Heart attack Mother   . Stroke Mother   . Liver cancer Mother   . Diabetes Mother   . Lung cancer Mother   . Alcoholism Father   . Sleep apnea Brother   . Depression Brother   . ADD / ADHD Brother   . Diabetes Maternal Aunt   . Diabetes Paternal Uncle   . Diabetes Maternal Grandmother     Social History   Tobacco Use  . Smoking status: Current Every Day Smoker    Packs/day: 0.25    Types: Cigarettes  . Smokeless tobacco: Never Used  Substance Use Topics  . Alcohol use: Not Currently    Frequency: Never  .  Drug use: Not Currently    Types: Marijuana    Comment: In the past, none in the past year    Allergies:  Allergies  Allergen Reactions  . Aspartame And Phenylalanine Anaphylaxis, Hives and Diarrhea  . Benadryl [Diphenhydramine] Anaphylaxis, Diarrhea and Other (See Comments)    Blisters also  . Other Anaphylaxis, Nausea And Vomiting, Rash and Other (See Comments)    Aspartame- Blisters, also Dust- Worsens asthma Ragweed- Worsens asthma, face gets red, and sneezing Animal Fur/Dander- Worsens asthma and sneezing    . Scallops [Shellfish Allergy] Anaphylaxis, Diarrhea and Nausea And Vomiting  . Yellow Jacket Venom [Bee Venom] Anaphylaxis, Diarrhea and Nausea And Vomiting    Seizures and numbness  . Pollen Extract Other (See Comments)    Runny nose, eyes, and asthma worsens  . Tegretol [Carbamazepine] Hives, Diarrhea and Other (See Comments)    Blisters in mouth and increase in seizures  . Adhesive [Tape] Rash  . Latex Hives and Rash     Blisters, also- Condoms and dental encounters    Medications Prior to Admission  Medication Sig Dispense Refill Last Dose  . acetaminophen (TYLENOL) 325 MG tablet Take 325-650 mg by mouth every 6 (six) hours as needed (for pain).   05/05/2018 at Unknown time  . folic acid (FOLVITE) 1 MG tablet Take 1 tablet (1 mg total) by mouth daily. 90 tablet 2 05/05/2018 at Unknown time  . hydrOXYzine (VISTARIL) 50 MG capsule Take 1 capsule (50 mg total) by mouth at bedtime. 90 capsule 2 05/04/2018 at Unknown time  . levETIRAcetam (KEPPRA) 500 MG tablet Take 1 tablet (500 mg total) by mouth 2 (two) times daily. 30 tablet 0 05/05/2018 at Unknown time  . Prenatal Multivit-Min-Fe-FA (PRENATAL VITAMINS) 0.8 MG tablet Take 1 tablet by mouth daily. 30 tablet 12 05/05/2018 at Unknown time  . albuterol (PROVENTIL HFA;VENTOLIN HFA) 108 (90 Base) MCG/ACT inhaler Inhale 2 puffs into the lungs every 6 (six) hours as needed for wheezing or shortness of breath.   Taking  . EPINEPHrine 0.3 mg/0.3 mL IJ SOAJ injection Inject 0.3 mg into the muscle as needed (for allergic reaction).    Not Taking    Review of Systems  Constitutional: Positive for fatigue. Negative for chills and fever.  Eyes: Positive for visual disturbance. Negative for photophobia.  Respiratory: Negative for cough, chest tightness and shortness of breath.   Cardiovascular: Positive for leg swelling.  Gastrointestinal: Positive for nausea and vomiting. Negative for abdominal pain.  Genitourinary: Negative for difficulty urinating, vaginal bleeding, vaginal discharge and vaginal pain.  Neurological: Negative for headaches.  All other systems reviewed and are negative.  Physical Exam   Blood pressure 111/72, pulse 72, temperature 97.8 F (36.6 C), resp. rate 17, height 5\' 8"  (1.727 m), weight 96.3 kg, last menstrual period 11/07/2017, SpO2 99 %.  Physical Exam  Nursing note and vitals reviewed. Constitutional: She is oriented to person, place, and time.  She appears well-developed and well-nourished.  Cardiovascular: Normal rate, regular rhythm and normal heart sounds.  Respiratory: Effort normal and breath sounds normal. No respiratory distress. She has no wheezes. She has no rales. She exhibits no tenderness.  GI: She exhibits no distension. There is no tenderness. There is no rebound and no guarding.  Gravid  Musculoskeletal:       Left ankle: She exhibits decreased range of motion and swelling. She exhibits no ecchymosis, no deformity and normal pulse. No tenderness.       Feet:  Neurological: She is  alert and oriented to person, place, and time. She has normal reflexes.  Skin: Skin is warm and dry.  Multiple newly healed lesions on lower extremities. Patient states they are acne. Look like insect bites.   Psychiatric: She has a normal mood and affect. Her behavior is normal. Judgment and thought content normal.    MAU Course  Procedures  MDM --Normotensive in MAU --No calf tenderness or discoloration. No clonus, no RUQ pain or tenderness.  --Acne vs bug bites on lower extremities, pt endorses frequent scratching --Patient uses wheelchair without foot pedal. "Scoots" using soles of feet. Does not routinely elevate legs after prolonged periods walking or in chair --Discussed possibility of ankle swelling related to prolonged period in chair without elevating legs. Also discussed induced cellulitis related to frequent scratching of bug bites --Reactive fetal tracing: baseline 150, moderate variability, positive accelerations, no decelerations --Toco: quiet  Patient Vitals for the past 24 hrs:  BP Temp Pulse Resp SpO2 Height Weight  05/05/18 2151 111/72 97.8 F (36.6 C) 72 17 99 % - -  05/05/18 2144 - - - - - 5\' 8"  (1.727 m) 96.3 kg   Orders Placed This Encounter  Procedures  . Urinalysis, Routine w reflex microscopic    Standing Status:   Standing    Number of Occurrences:   1  . Discharge patient    Order Specific Question:    Discharge disposition    Answer:   01-Home or Self Care [1]    Order Specific Question:   Discharge patient date    Answer:   05/05/2018   Results for orders placed or performed during the hospital encounter of 05/05/18 (from the past 24 hour(s))  Urinalysis, Routine w reflex microscopic     Status: Abnormal   Collection Time: 05/05/18 10:03 PM  Result Value Ref Range   Color, Urine YELLOW YELLOW   APPearance HAZY (A) CLEAR   Specific Gravity, Urine 1.014 1.005 - 1.030   pH 6.0 5.0 - 8.0   Glucose, UA NEGATIVE NEGATIVE mg/dL   Hgb urine dipstick NEGATIVE NEGATIVE   Bilirubin Urine NEGATIVE NEGATIVE   Ketones, ur NEGATIVE NEGATIVE mg/dL   Protein, ur 30 (A) NEGATIVE mg/dL   Nitrite NEGATIVE NEGATIVE   Leukocytes, UA NEGATIVE NEGATIVE   RBC / HPF 0-5 0 - 5 RBC/hpf   WBC, UA 0-5 0 - 5 WBC/hpf   Bacteria, UA NONE SEEN NONE SEEN   Squamous Epithelial / LPF 0-5 0 - 5   Mucus PRESENT    Ca Oxalate Crys, UA PRESENT      Assessment and Plan  --37 y.o. G2P0010 at [redacted]w[redacted]d  --Reactive fetal tracing --Normotensive --No concerning results on UA --Discussed OTC anti itch creme for bug bites, pt treating with topical alcohol --Elevate feet in evenings PRN, consider compression socks --Discharge home in stable condition  F/u: [facility] appt 05/08/18  Darlina Rumpf, CNM 05/05/2018, 11:02 PM

## 2018-05-05 NOTE — Discharge Instructions (Signed)
Dietary Guidelines to Help Prevent Kidney Stones Kidney stones are deposits of minerals and salts that form inside your kidneys. Your risk of developing kidney stones may be greater depending on your diet, your lifestyle, the medicines you take, and whether you have certain medical conditions. Most people can reduce their chances of developing kidney stones by following the instructions below. Depending on your overall health and the type of kidney stones you tend to develop, your dietitian may give you more specific instructions. What are tips for following this plan? Reading food labels  Choose foods with "no salt added" or "low-salt" labels. Limit your sodium intake to less than 1500 mg per day.  Choose foods with calcium for each meal and snack. Try to eat about 300 mg of calcium at each meal. Foods that contain 200-500 mg of calcium per serving include: ? 8 oz (237 ml) of milk, fortified nondairy milk, and fortified fruit juice. ? 8 oz (237 ml) of kefir, yogurt, and soy yogurt. ? 4 oz (118 ml) of tofu. ? 1 oz of cheese. ? 1 cup (300 g) of dried figs. ? 1 cup (91 g) of cooked broccoli. ? 1-3 oz can of sardines or mackerel.  Most people need 1000 to 1500 mg of calcium each day. Talk to your dietitian about how much calcium is recommended for you. Shopping  Buy plenty of fresh fruits and vegetables. Most people do not need to avoid fruits and vegetables, even if they contain nutrients that may contribute to kidney stones.  When shopping for convenience foods, choose: ? Whole pieces of fruit. ? Premade salads with dressing on the side. ? Low-fat fruit and yogurt smoothies.  Avoid buying frozen meals or prepared deli foods.  Look for foods with live cultures, such as yogurt and kefir. Cooking  Do not add salt to food when cooking. Place a salt shaker on the table and allow each person to add his or her own salt to taste.  Use vegetable protein, such as beans, textured vegetable  protein (TVP), or tofu instead of meat in pasta, casseroles, and soups. Meal planning  Eat less salt, if told by your dietitian. To do this: ? Avoid eating processed or premade food. ? Avoid eating fast food.  Eat less animal protein, including cheese, meat, poultry, or fish, if told by your dietitian. To do this: ? Limit the number of times you have meat, poultry, fish, or cheese each week. Eat a diet free of meat at least 2 days a week. ? Eat only one serving each day of meat, poultry, fish, or seafood. ? When you prepare animal protein, cut pieces into small portion sizes. For most meat and fish, one serving is about the size of one deck of cards.  Eat at least 5 servings of fresh fruits and vegetables each day. To do this: ? Keep fruits and vegetables on hand for snacks. ? Eat 1 piece of fruit or a handful of berries with breakfast. ? Have a salad and fruit at lunch. ? Have two kinds of vegetables at dinner.  Limit foods that are high in a substance called oxalate. These include: ? Spinach. ? Rhubarb. ? Beets. ? Potato chips and french fries. ? Nuts.  If you regularly take a diuretic medicine, make sure to eat at least 1-2 fruits or vegetables high in potassium each day. These include: ? Avocado. ? Banana. ? Orange, prune, carrot, or tomato juice. ? Baked potato. ? Cabbage. ? Beans and split peas.   General instructions  Drink enough fluid to keep your urine clear or pale yellow. This is the most important thing you can do.  Talk to your health care provider and dietitian about taking daily supplements. Depending on your health and the cause of your kidney stones, you may be advised: ? Not to take supplements with vitamin C. ? To take a calcium supplement. ? To take a daily probiotic supplement. ? To take other supplements such as magnesium, fish oil, or vitamin B6.  Take all medicines and supplements as told by your health care provider.  Limit alcohol intake to no more  than 1 drink a day for nonpregnant women and 2 drinks a day for men. One drink equals 12 oz of beer, 5 oz of wine, or 1 oz of hard liquor.  Lose weight if told by your health care provider. Work with your dietitian to find strategies and an eating plan that works best for you. What foods are not recommended? Limit your intake of the following foods, or as told by your dietitian. Talk to your dietitian about specific foods you should avoid based on the type of kidney stones and your overall health. Grains Breads. Bagels. Rolls. Baked goods. Salted crackers. Cereal. Pasta. Vegetables Spinach. Rhubarb. Beets. Canned vegetables. Pickles. Olives. Meats and other protein foods Nuts. Nut butters. Large portions of meat, poultry, or fish. Salted or cured meats. Deli meats. Hot dogs. Sausages. Dairy Cheese. Beverages Regular soft drinks. Regular vegetable juice. Seasonings and other foods Seasoning blends with salt. Salad dressings. Canned soups. Soy sauce. Ketchup. Barbecue sauce. Canned pasta sauce. Casseroles. Pizza. Lasagna. Frozen meals. Potato chips. French fries. Summary  You can reduce your risk of kidney stones by making changes to your diet.  The most important thing you can do is drink enough fluid. You should drink enough fluid to keep your urine clear or pale yellow.  Ask your health care provider or dietitian how much protein from animal sources you should eat each day, and also how much salt and calcium you should have each day. This information is not intended to replace advice given to you by your health care provider. Make sure you discuss any questions you have with your health care provider. Document Released: 12/01/2010 Document Revised: 07/17/2016 Document Reviewed: 07/17/2016 Elsevier Interactive Patient Education  2018 Elsevier Inc.  

## 2018-05-05 NOTE — MAU Note (Signed)
Vomited X4 today.  Had N/V earlier in the pregnancy but none in the past month until today.  No LOF/VB.  Hasn't felt baby move in the past half hour-tried drinking cold water without feeling any movement.  Also noticed her left leg has started swelling which is not normal for her.  Also has a headache-had tylenol this morning without any relief.  Also wants to be evaluated for her high blood pressure.

## 2018-05-08 ENCOUNTER — Ambulatory Visit (INDEPENDENT_AMBULATORY_CARE_PROVIDER_SITE_OTHER): Payer: Medicare (Managed Care) | Admitting: Family Medicine

## 2018-05-08 VITALS — BP 113/67 | HR 83 | Wt 210.0 lb

## 2018-05-08 DIAGNOSIS — F251 Schizoaffective disorder, depressive type: Secondary | ICD-10-CM

## 2018-05-08 DIAGNOSIS — O0990 Supervision of high risk pregnancy, unspecified, unspecified trimester: Secondary | ICD-10-CM

## 2018-05-08 DIAGNOSIS — F3181 Bipolar II disorder: Secondary | ICD-10-CM

## 2018-05-08 DIAGNOSIS — Z23 Encounter for immunization: Secondary | ICD-10-CM

## 2018-05-08 DIAGNOSIS — G40909 Epilepsy, unspecified, not intractable, without status epilepticus: Secondary | ICD-10-CM

## 2018-05-08 DIAGNOSIS — F603 Borderline personality disorder: Secondary | ICD-10-CM

## 2018-05-08 DIAGNOSIS — O0992 Supervision of high risk pregnancy, unspecified, second trimester: Secondary | ICD-10-CM

## 2018-05-08 DIAGNOSIS — O99352 Diseases of the nervous system complicating pregnancy, second trimester: Secondary | ICD-10-CM

## 2018-05-08 NOTE — Progress Notes (Signed)
   PRENATAL VISIT NOTE  Subjective:  Amanda [name] is a 37 y.o. G2P0010 at [redacted]w[redacted]d being seen today for ongoing prenatal care.  She is currently monitored for the following issues for this high-risk pregnancy and has Schizoaffective disorder, depressive type ([place]); Seizure disorder ([place]); Supervision of high risk pregnancy, antepartum; Seizure disorder during pregnancy ([place]); Advanced maternal age, primigravida; Bipolar 2 disorder ([place]); Borderline personality disorder ([place]); Major depression, chronic; PTSD (post-traumatic stress disorder); OCD (obsessive compulsive disorder); Asthma; Pap smear of cervix shows high risk HPV present; and UTI in pregnancy on their problem list.  Patient reports no complaints.  Contractions: Not present. Vag. Bleeding: None.  Movement: Present. Denies leaking of fluid.   The following portions of the patient's history were reviewed and updated as appropriate: allergies, current medications, past family history, past medical history, past social history, past surgical history and problem list. Problem list updated.  Objective:   Vitals:   05/08/18 1605 05/08/18 1654  BP: (!) 147/70 113/67  Pulse: 83   Weight: 210 lb (95.3 kg)     Fetal Status: Fetal Heart Rate (bpm): 154 Fundal Height: 26 cm Movement: Present     General:  Alert, oriented and cooperative. Patient is in no acute distress.  Skin: Skin is warm and dry. No rash noted.   Cardiovascular: Normal heart rate noted  Respiratory: Normal respiratory effort, no problems with respiration noted  Abdomen: Soft, gravid, appropriate for gestational age.  Pain/Pressure: Present     Pelvic: Cervical exam deferred        Extremities: Normal range of motion.  Edema: Mild pitting, slight indentation  Mental Status: Normal mood and affect. Normal behavior. Normal judgment and thought content.   Assessment and Plan:  Pregnancy: G2P0010 at [redacted]w[redacted]d  1. Supervision of high risk pregnancy, antepartum FHT and FH normal.  28 week labs next appt/ - Flu Vaccine QUAD 36+ mos IM  2. Bipolar 2 disorder (HCC)  3. Seizure disorder during pregnancy in second trimester ([place]) On keppra  4. Borderline personality disorder ([place]) On vistaril  5. Schizoaffective disorder, depressive type ([place])    Preterm labor symptoms and general obstetric precautions including but not limited to vaginal bleeding, contractions, leaking of fluid and fetal movement were reviewed in detail with the patient. Please refer to After Visit Summary for other counseling recommendations.  Return in about 2 weeks (around 05/22/2018) for HR OB f/u, 2 hr GTT.  Future Appointments  Date Time Provider   05/15/2018  3:00 PM [place] 3 [facility]-M[facility]US MFC-US  05/23/2018  9:15 AM Aletha Halim, MD [facility]  05/23/2018 10:00 AM [facility] LAB [facility] [place], DO

## 2018-05-15 ENCOUNTER — Ambulatory Visit (HOSPITAL_COMMUNITY)
Admission: RE | Admit: 2018-05-15 | Discharge: 2018-05-15 | Disposition: A | Discharge status: Home / Self Care | Payer: Medicare (Managed Care) | Source: Ambulatory Visit | Attending: Obstetrics and Gynecology | Admitting: Obstetrics and Gynecology

## 2018-05-15 ENCOUNTER — Encounter (HOSPITAL_COMMUNITY): Payer: Self-pay

## 2018-05-15 DIAGNOSIS — G40909 Epilepsy, unspecified, not intractable, without status epilepticus: Secondary | ICD-10-CM

## 2018-05-15 DIAGNOSIS — O99352 Diseases of the nervous system complicating pregnancy, second trimester: Secondary | ICD-10-CM

## 2018-05-15 DIAGNOSIS — O09522 Supervision of elderly multigravida, second trimester: Secondary | ICD-10-CM

## 2018-05-15 DIAGNOSIS — O09512 Supervision of elderly primigravida, second trimester: Secondary | ICD-10-CM

## 2018-05-15 DIAGNOSIS — F319 Bipolar disorder, unspecified: Secondary | ICD-10-CM | POA: Diagnosis not present

## 2018-05-15 DIAGNOSIS — O99342 Other mental disorders complicating pregnancy, second trimester: Secondary | ICD-10-CM | POA: Insufficient documentation

## 2018-05-15 DIAGNOSIS — Z3A27 27 weeks gestation of pregnancy: Secondary | ICD-10-CM | POA: Diagnosis not present

## 2018-05-15 DIAGNOSIS — Z362 Encounter for other antenatal screening follow-up: Secondary | ICD-10-CM

## 2018-05-15 DIAGNOSIS — O0990 Supervision of high risk pregnancy, unspecified, unspecified trimester: Secondary | ICD-10-CM

## 2018-05-16 ENCOUNTER — Other Ambulatory Visit (HOSPITAL_COMMUNITY): Payer: Self-pay | Admitting: *Deleted

## 2018-05-16 DIAGNOSIS — G40909 Epilepsy, unspecified, not intractable, without status epilepticus: Secondary | ICD-10-CM

## 2018-05-16 DIAGNOSIS — O99353 Diseases of the nervous system complicating pregnancy, third trimester: Principal | ICD-10-CM

## 2018-05-22 ENCOUNTER — Other Ambulatory Visit: Payer: Self-pay | Admitting: *Deleted

## 2018-05-22 DIAGNOSIS — O0990 Supervision of high risk pregnancy, unspecified, unspecified trimester: Secondary | ICD-10-CM

## 2018-05-23 ENCOUNTER — Encounter: Payer: Self-pay | Admitting: Obstetrics and Gynecology

## 2018-05-23 ENCOUNTER — Ambulatory Visit (INDEPENDENT_AMBULATORY_CARE_PROVIDER_SITE_OTHER): Payer: Medicare (Managed Care) | Admitting: Obstetrics and Gynecology

## 2018-05-23 ENCOUNTER — Other Ambulatory Visit: Payer: Medicare (Managed Care)

## 2018-05-23 VITALS — BP 120/73 | HR 76 | Wt 207.6 lb

## 2018-05-23 DIAGNOSIS — Z23 Encounter for immunization: Secondary | ICD-10-CM | POA: Diagnosis not present

## 2018-05-23 DIAGNOSIS — O0990 Supervision of high risk pregnancy, unspecified, unspecified trimester: Secondary | ICD-10-CM

## 2018-05-23 DIAGNOSIS — O0993 Supervision of high risk pregnancy, unspecified, third trimester: Secondary | ICD-10-CM

## 2018-05-23 NOTE — Progress Notes (Signed)
Prenatal Visit Note Date: 05/23/2018 Clinic: [facility]  Subjective:  Amanda [name] is a 37 y.o. G2P0010 at [redacted]w[redacted]d being seen today for ongoing prenatal care.  She is currently monitored for the following issues for this high-risk pregnancy and has Schizoaffective disorder, depressive type ([place]); Seizure disorder ([place]); Supervision of high risk pregnancy, antepartum; Seizure disorder during pregnancy ([place]); Advanced maternal age, primigravida; Bipolar 2 disorder ([place]); Borderline personality disorder ([place]); Major depression, chronic; PTSD (post-traumatic stress disorder); OCD (obsessive compulsive disorder); Asthma; Pap smear of cervix shows high risk HPV present; and UTI in pregnancy on their problem list.  Patient reports see below.   Contractions: Not present. Vag. Bleeding: None.  Movement: Present. Denies leaking of fluid.   The following portions of the patient's history were reviewed and updated as appropriate: allergies, current medications, past family history, past medical history, past social history, past surgical history and problem list. Problem list updated.  Objective:   Vitals:   05/23/18 0907  BP: 120/73  Pulse: 76  Weight: 207 lb 9.6 oz (94.2 kg)    Fetal Status: Fetal Heart Rate (bpm): 152 Fundal Height: 28 cm Movement: Present     General:  Alert, oriented and cooperative. Patient is in no acute distress.  Skin: Skin is warm and dry. No rash noted.   Cardiovascular: Normal heart rate noted  Respiratory: Normal respiratory effort, no problems with respiration noted  Abdomen: Soft, gravid, appropriate for gestational age. Pain/Pressure: Absent     Pelvic:  Cervical exam deferred        Extremities: Normal range of motion.  Edema: Mild pitting, slight indentation  Mental Status: Normal mood and affect. Normal behavior. Normal judgment and thought content.   Urinalysis:      Assessment and Plan:  Pregnancy: G2P0010 at [redacted]w[redacted]d  1. Supervision of  high risk pregnancy, antepartum 28wk labs. Desires to breast feed. I told her to make asap f/u visit with her neurologist and as she gets closer to [facility] to let them know she wants to breast feed and we can see what they may want to do postpartum and tailor her birth control to that - Tdap vaccine greater than or equal to 7yo IM  2. H/o seizures Pt on keppra 500 bid. She has multiple no shows to [facility] and hopefully they will still see her. She states last seizure was about a week ago when she got angry on the phone with a relative. She states when she has a seizure they only last a few seconds. She is getting monthly growth u/s. Consider 32wk qwk testing and I told her when I'd recommend coming to [facility] triage   3. Psych Stable. Doesn't see a psych provider. On vistaril qhs   4. Skin lesion She has a 1.5 x 1.5cm pendunculated what looks to be a benign mole on her left upper back. She says it's been there for years but has looked bigger and little more red. Can take it off next visit. Will have the front desk schedule more time with nv.   Preterm labor symptoms and general obstetric precautions including but not limited to vaginal bleeding, contractions, leaking of fluid and fetal movement were reviewed in detail with the patient. Please refer to After Visit Summary for other counseling recommendations.  Return in about 2 weeks (around 06/06/2018) for hrob.   Aletha Halim, MD

## 2018-05-24 LAB — HIV ANTIBODY (ROUTINE TESTING W REFLEX): HIV Screen 4th Generation wRfx: NONREACTIVE

## 2018-05-24 LAB — GLUCOSE TOLERANCE, 2 HOURS W/ 1HR
GLUCOSE, 1 HOUR: 171 mg/dL (ref 65–179)
Glucose, 2 hour: 103 mg/dL (ref 65–152)
Glucose, Fasting: 73 mg/dL (ref 65–91)

## 2018-05-24 LAB — CBC
HEMOGLOBIN: 13.3 g/dL (ref 11.1–15.9)
Hematocrit: 38.2 % (ref 34.0–46.6)
MCH: 31.6 pg (ref 26.6–33.0)
MCHC: 34.8 g/dL (ref 31.5–35.7)
MCV: 91 fL (ref 79–97)
Platelets: 353 10*3/uL (ref 150–450)
RBC: 4.21 x10E6/uL (ref 3.77–5.28)
RDW: 13 % (ref 12.3–15.4)
WBC: 16.7 10*3/uL — AB (ref 3.4–10.8)

## 2018-05-24 LAB — RPR: RPR Ser Ql: NONREACTIVE

## 2018-06-05 ENCOUNTER — Ambulatory Visit (INDEPENDENT_AMBULATORY_CARE_PROVIDER_SITE_OTHER): Payer: Medicare (Managed Care) | Admitting: Family Medicine

## 2018-06-05 ENCOUNTER — Other Ambulatory Visit (HOSPITAL_COMMUNITY)
Admission: RE | Admit: 2018-06-05 | Discharge: 2018-06-05 | Disposition: A | Discharge status: Home / Self Care | Payer: Medicare (Managed Care) | Source: Ambulatory Visit | Attending: Family Medicine | Admitting: Family Medicine

## 2018-06-05 VITALS — BP 133/79 | HR 82 | Wt 212.0 lb

## 2018-06-05 DIAGNOSIS — D229 Melanocytic nevi, unspecified: Secondary | ICD-10-CM | POA: Insufficient documentation

## 2018-06-05 DIAGNOSIS — G40909 Epilepsy, unspecified, not intractable, without status epilepticus: Secondary | ICD-10-CM

## 2018-06-05 DIAGNOSIS — F251 Schizoaffective disorder, depressive type: Secondary | ICD-10-CM

## 2018-06-05 DIAGNOSIS — O99353 Diseases of the nervous system complicating pregnancy, third trimester: Secondary | ICD-10-CM

## 2018-06-05 DIAGNOSIS — O09512 Supervision of elderly primigravida, second trimester: Secondary | ICD-10-CM

## 2018-06-05 DIAGNOSIS — O0990 Supervision of high risk pregnancy, unspecified, unspecified trimester: Secondary | ICD-10-CM

## 2018-06-05 NOTE — Patient Instructions (Signed)

## 2018-06-06 NOTE — Progress Notes (Signed)
   PRENATAL VISIT NOTE  Subjective:  Amanda [name] is a 37 y.o. G2P0010 at [redacted]w[redacted]d being seen today for ongoing prenatal care.  She is currently monitored for the following issues for this high-risk pregnancy and has Schizoaffective disorder, depressive type ([place]); Seizure disorder ([place]); Supervision of high risk pregnancy, antepartum; Seizure disorder during pregnancy ([place]); Advanced maternal age, primigravida; Bipolar 2 disorder ([place]); Borderline personality disorder ([place]); Major depression, chronic; PTSD (post-traumatic stress disorder); OCD (obsessive compulsive disorder); Asthma; Pap smear of cervix shows high risk HPV present; and UTI in pregnancy on their problem list.  Patient reports mole changing.  Contractions: Not present. Vag. Bleeding: None.  Movement: Present. Denies leaking of fluid.   The following portions of the patient's history were reviewed and updated as appropriate: allergies, current medications, past family history, past medical history, past social history, past surgical history and problem list. Problem list updated.  Objective:   Vitals:   06/05/18 1537  BP: 133/79  Pulse: 82  Weight: 212 lb (96.2 kg)    Fetal Status: Fetal Heart Rate (bpm): 152 Fundal Height: 30 cm Movement: Present     General:  Alert, oriented and cooperative. Patient is in no acute distress.  Skin: Skin is warm and dry. No rash noted.   Cardiovascular: Normal heart rate noted  Respiratory: Normal respiratory effort, no problems with respiration noted  Abdomen: Soft, gravid, appropriate for gestational age.  Pain/Pressure: Present     Pelvic: Cervical exam deferred        Extremities: Normal range of motion.  Edema: Trace  Mental Status: Normal mood and affect. Normal behavior. Normal judgment and thought content.    Procedure: Timeout performed. One and half cc of 1% Lidocaine was injected about the lesion.  Mole lifted and snipped off at base with scissors. Small amount of bleeding  controlled with AgNO3. 4 x 4 placed over wound. Patient tolerated this well.   Assessment and Plan:  Pregnancy: G2P0010 at [redacted]w[redacted]d  1. Supervision of high risk pregnancy, antepartum Nml 28 wk labs  2. Primigravida of advanced maternal age in second trimester Nml NIPT  3. Seizure disorder during pregnancy in third trimester ([place]) Continue Keppra  4. Schizoaffective disorder, depressive type ([place]) On Keppra to f/u with [facility] Given housing info, she is living in a hotel at present  5. Change in mole S/p removal, check pathology - Surgical pathology  Preterm labor symptoms and general obstetric precautions including but not limited to vaginal bleeding, contractions, leaking of fluid and fetal movement were reviewed in detail with the patient. Please refer to After Visit Summary for other counseling recommendations.  Return in 4 weeks (on 07/03/2018).  Future Appointments  Date Time Provider [place]  06/12/2018  2:15 PM Solana [place] 4 WH-MFCUS MFC-US  06/20/2018 10:55 AM Chancy Milroy, MD [facility]    Donnamae Jude, MD

## 2018-06-10 ENCOUNTER — Encounter: Payer: Self-pay | Admitting: *Deleted

## 2018-06-12 ENCOUNTER — Encounter (HOSPITAL_COMMUNITY): Payer: Self-pay

## 2018-06-12 ENCOUNTER — Other Ambulatory Visit (HOSPITAL_COMMUNITY): Payer: Self-pay | Admitting: *Deleted

## 2018-06-12 ENCOUNTER — Ambulatory Visit (HOSPITAL_COMMUNITY)
Admission: RE | Admit: 2018-06-12 | Discharge: 2018-06-12 | Disposition: A | Discharge status: Home / Self Care | Payer: Medicaid - Out of State | Source: Ambulatory Visit | Attending: Obstetrics and Gynecology | Admitting: Obstetrics and Gynecology

## 2018-06-12 DIAGNOSIS — O99343 Other mental disorders complicating pregnancy, third trimester: Secondary | ICD-10-CM

## 2018-06-12 DIAGNOSIS — O0990 Supervision of high risk pregnancy, unspecified, unspecified trimester: Secondary | ICD-10-CM

## 2018-06-12 DIAGNOSIS — Z362 Encounter for other antenatal screening follow-up: Secondary | ICD-10-CM

## 2018-06-12 DIAGNOSIS — G40909 Epilepsy, unspecified, not intractable, without status epilepticus: Secondary | ICD-10-CM

## 2018-06-12 DIAGNOSIS — Z3A31 31 weeks gestation of pregnancy: Secondary | ICD-10-CM

## 2018-06-12 DIAGNOSIS — O09529 Supervision of elderly multigravida, unspecified trimester: Secondary | ICD-10-CM

## 2018-06-12 DIAGNOSIS — O09512 Supervision of elderly primigravida, second trimester: Secondary | ICD-10-CM

## 2018-06-12 DIAGNOSIS — O99213 Obesity complicating pregnancy, third trimester: Secondary | ICD-10-CM | POA: Insufficient documentation

## 2018-06-12 DIAGNOSIS — O99353 Diseases of the nervous system complicating pregnancy, third trimester: Secondary | ICD-10-CM | POA: Diagnosis not present

## 2018-06-12 DIAGNOSIS — F319 Bipolar disorder, unspecified: Secondary | ICD-10-CM | POA: Insufficient documentation

## 2018-06-12 DIAGNOSIS — O09523 Supervision of elderly multigravida, third trimester: Secondary | ICD-10-CM | POA: Insufficient documentation

## 2018-06-17 ENCOUNTER — Other Ambulatory Visit: Payer: Self-pay

## 2018-06-17 ENCOUNTER — Encounter (HOSPITAL_COMMUNITY): Payer: Self-pay

## 2018-06-17 ENCOUNTER — Inpatient Hospital Stay (HOSPITAL_COMMUNITY)
Admission: AD | Admit: 2018-06-17 | Discharge: 2018-06-19 | DRG: 833 | Disposition: A | Discharge status: Home / Self Care | Payer: Medicare (Managed Care) | Attending: Family Medicine | Admitting: Family Medicine

## 2018-06-17 DIAGNOSIS — O0990 Supervision of high risk pregnancy, unspecified, unspecified trimester: Secondary | ICD-10-CM

## 2018-06-17 DIAGNOSIS — O1413 Severe pre-eclampsia, third trimester: Secondary | ICD-10-CM | POA: Diagnosis not present

## 2018-06-17 DIAGNOSIS — Z3A31 31 weeks gestation of pregnancy: Secondary | ICD-10-CM

## 2018-06-17 DIAGNOSIS — O99333 Smoking (tobacco) complicating pregnancy, third trimester: Secondary | ICD-10-CM | POA: Diagnosis present

## 2018-06-17 DIAGNOSIS — O139 Gestational [pregnancy-induced] hypertension without significant proteinuria, unspecified trimester: Secondary | ICD-10-CM | POA: Diagnosis present

## 2018-06-17 DIAGNOSIS — R1031 Right lower quadrant pain: Secondary | ICD-10-CM | POA: Diagnosis not present

## 2018-06-17 DIAGNOSIS — F17210 Nicotine dependence, cigarettes, uncomplicated: Secondary | ICD-10-CM | POA: Diagnosis present

## 2018-06-17 HISTORY — DX: Unspecified hearing loss, unspecified ear: H91.90

## 2018-06-17 HISTORY — DX: Rheumatoid arthritis, unspecified: M06.9

## 2018-06-17 LAB — URINALYSIS, ROUTINE W REFLEX MICROSCOPIC
BILIRUBIN URINE: NEGATIVE
Glucose, UA: NEGATIVE mg/dL
Hgb urine dipstick: NEGATIVE
Ketones, ur: NEGATIVE mg/dL
Leukocytes, UA: NEGATIVE
NITRITE: NEGATIVE
PROTEIN: NEGATIVE mg/dL
SPECIFIC GRAVITY, URINE: 1.017 (ref 1.005–1.030)
pH: 6 (ref 5.0–8.0)

## 2018-06-17 LAB — CBC
HEMATOCRIT: 35.2 % — AB (ref 36.0–46.0)
Hemoglobin: 12.1 g/dL (ref 12.0–15.0)
MCH: 30.9 pg (ref 26.0–34.0)
MCHC: 34.4 g/dL (ref 30.0–36.0)
MCV: 89.8 fL (ref 80.0–100.0)
NRBC: 0 % (ref 0.0–0.2)
PLATELETS: 323 10*3/uL (ref 150–400)
RBC: 3.92 MIL/uL (ref 3.87–5.11)
RDW: 13.7 % (ref 11.5–15.5)
WBC: 17.2 10*3/uL — AB (ref 4.0–10.5)

## 2018-06-17 NOTE — MAU Note (Signed)
Pt here with pain in right side and lower abdominal area, headache for couple days, right leg pain. Denies any leaking or bleeding. Reports fetal movement.

## 2018-06-17 NOTE — MAU Provider Note (Addendum)
Chief Complaint:  Headache; Nausea; and Abdominal Pain   First Provider Initiated Contact with Patient 06/17/18 2339     HPI: Amanda [name] is a 37 y.o. G2P0010 at 14w5dwho presents to maternity admissions reporting pain on right lower abdomen and a headache which she has had for several days.  States headaches are usually due to "low blood sugar" and usually get better with eating, but this time it did not.  Has had right leg pain but this is chronic.Marland Kitchen She reports good fetal movement, denies LOF, vaginal bleeding, vaginal itching/burning, urinary symptoms, h/a, dizziness, n/v, diarrhea, constipation or fever/chills.  She denies visual changes or RUQ abdominal pain.  Headache   This is a recurrent problem. The current episode started in the past 7 days. The problem occurs constantly. The problem has been unchanged. The quality of the pain is described as aching. Associated symptoms include abdominal pain. Pertinent negatives include no back pain, blurred vision, dizziness, fever, muscle aches, nausea, photophobia, sinus pressure or visual change. Nothing aggravates the symptoms. She has tried nothing (eating) for the symptoms. The treatment provided no relief.  Abdominal Pain  This is a new problem. The current episode started in the past 7 days. The onset quality is gradual. The problem occurs intermittently. The pain is located in the RLQ. The pain is mild. The quality of the pain is cramping. The abdominal pain does not radiate. Associated symptoms include headaches. Pertinent negatives include no fever or nausea. Nothing aggravates the pain. The pain is relieved by nothing. She has tried nothing for the symptoms.    RN note: Pt here with pain in right side and lower abdominal area, headache for couple days, right leg pain. Denies any leaking or bleeding. Reports fetal movement.   Past Medical History: Past Medical History:  Diagnosis Date  . Anxiety   . Asthma   . Asthma due to environmental  allergies   . Asthma due to seasonal allergies   . Bipolar 1 disorder ([place])   . Borderline personality disorder ([place])   . Brain bleed ([place])   . Chronic post-traumatic stress disorder (PTSD)    complex chronic with psychotic features and self harm behaviors  . Dander (animal) allergy   . Hearing loss    right ear  . Heart murmur   . Hip dysplasia   . Major depression, chronic   . Mood disorder ([place])   . OCD (obsessive compulsive disorder)   . RA (rheumatoid arthritis) ([place])   . Rheumatoid arthritis ([place])   . Seizure disorder ([place])   . Suicidal ideations   . Suicide attempt [facility])     Past obstetric history: OB History  Gravida Para Term Preterm AB Living  2 0 0 0 1 0  SAB TAB Ectopic Multiple Live Births  1 0 0        # Outcome Date GA Lbr Len/2nd Weight Sex Delivery Anes PTL Lv  2 Current           1 SAB  [redacted]w[redacted]d   M        Past Surgical History: Past Surgical History:  Procedure Laterality Date  . EYE SURGERY  1985  . SHUNT REMOVAL  1983  . Tooth Removal     multiple    Family History: Family History  Problem Relation Age of Onset  . Heart attack Mother   . Stroke Mother   . Liver cancer Mother   . Diabetes Mother   . Lung cancer Mother   .  Alcoholism Father   . Sleep apnea Brother   . Depression Brother   . ADD / ADHD Brother   . Diabetes Maternal Aunt   . Diabetes Paternal Uncle   . Diabetes Maternal Grandmother     Social History: Social History   Tobacco Use  . Smoking status: Current Every Day Smoker    Packs/day: 0.25    Types: Cigarettes  . Smokeless tobacco: Never Used  Substance Use Topics  . Alcohol use: Not Currently    Frequency: Never  . Drug use: Not Currently    Types: Marijuana    Comment:  last used in Jan 2019 due to "RA" per pt    Allergies:  Allergies  Allergen Reactions  . Aspartame And Phenylalanine Anaphylaxis, Hives and Diarrhea  . Benadryl [Diphenhydramine] Anaphylaxis, Diarrhea and Other (See Comments)     Blisters also  . Other Anaphylaxis, Nausea And Vomiting, Rash and Other (See Comments)    Aspartame- Blisters, also Dust- Worsens asthma Ragweed- Worsens asthma, face gets red, and sneezing Animal Fur/Dander- Worsens asthma and sneezing    . Scallops [Shellfish Allergy] Anaphylaxis, Diarrhea and Nausea And Vomiting  . Yellow Jacket Venom [Bee Venom] Anaphylaxis, Diarrhea and Nausea And Vomiting    Seizures and numbness  . Pollen Extract Other (See Comments)    Runny nose, eyes, and asthma worsens  . Tegretol [Carbamazepine] Hives, Diarrhea and Other (See Comments)    Blisters in mouth and increase in seizures  . Adhesive [Tape] Rash  . Latex Hives and Rash    Blisters, also- Condoms and dental encounters    Meds:  Medications Prior to Admission  Medication Sig Dispense Refill Last Dose  . acetaminophen (TYLENOL) 325 MG tablet Take 325-650 mg by mouth every 6 (six) hours as needed (for pain).   Taking  . albuterol (PROVENTIL HFA;VENTOLIN HFA) 108 (90 Base) MCG/ACT inhaler Inhale 2 puffs into the lungs every 6 (six) hours as needed for wheezing or shortness of breath.   Taking  . EPINEPHrine 0.3 mg/0.3 mL IJ SOAJ injection Inject 0.3 mg into the muscle as needed (for allergic reaction).    Not Taking  . folic acid (FOLVITE) 1 MG tablet Take 1 tablet (1 mg total) by mouth daily. 90 tablet 2 Taking  . hydrOXYzine (VISTARIL) 50 MG capsule Take 1 capsule (50 mg total) by mouth at bedtime. 90 capsule 2 Taking  . levETIRAcetam (KEPPRA) 500 MG tablet Take 1 tablet (500 mg total) by mouth 2 (two) times daily. 30 tablet 0 Taking  . Prenatal Multivit-Min-Fe-FA (PRENATAL VITAMINS) 0.8 MG tablet Take 1 tablet by mouth daily. 30 tablet 12 Taking    I have reviewed patient's Past Medical Hx, Surgical Hx, Family Hx, Social Hx, medications and allergies.   ROS:  Review of Systems  Constitutional: Negative for fever.  HENT: Negative for sinus pressure.   Eyes: Negative for blurred vision and  photophobia.  Gastrointestinal: Positive for abdominal pain. Negative for nausea.  Musculoskeletal: Negative for back pain.  Neurological: Positive for headaches. Negative for dizziness.   Other systems negative  Physical Exam   Patient Vitals for the past 24 hrs:  BP Temp Temp src Pulse Resp SpO2 Height Weight  06/17/18 2330 (!) 165/73 - - 68 - - - -  06/17/18 2315 (!) 151/79 - - 78 - - - -  06/17/18 2300 (!) 159/79 - - 78 - - - -  06/17/18 2245 (!) 161/82 - - 82 - - - -  06/17/18 2228  119/60 - - 86 - - - -  06/17/18 2223 - - - - - 97 % - -  06/17/18 2207 (!) 109/96 - - 85 - - - -  06/17/18 1927 140/71 98.3 F (36.8 C) Oral 86 18 95 % 5\' 8"  (1.727 m) 98.4 kg   Vitals:   06/18/18 0035 06/18/18 0051 06/18/18 0110 06/18/18 0131  BP:  122/81 119/73 109/64  Pulse:  78 71 75  Resp:  17 18   Temp:  (!) 97.5 F (36.4 C)    TempSrc:  Oral    SpO2: 95% 98%    Weight:      Height:        Constitutional: Well-developed, well-nourished female in no acute distress.  Cardiovascular: normal rate and rhythm  Loud heart murmur Respiratory: normal effort, clear to auscultation bilaterally GI: Abd soft, non-tender, gravid appropriate for gestational age.   No rebound or guarding. MS: Extremities nontender, no edema, normal ROM Neurologic: Alert and oriented x 4.  GU: Neg CVAT.  PELVIC EXAM:  deferred  FHT:  Baseline 140 , moderate variability, accelerations present, no decelerations Contractions: occasional, Irregular    Labs: O/Positive/-- (05/22 1317) . Results for orders placed or performed during the hospital encounter of 06/17/18 (from the past 24 hour(s))  Urinalysis, Routine w reflex microscopic     Status: Abnormal   Collection Time: 06/17/18  7:35 PM  Result Value Ref Range   Color, Urine YELLOW YELLOW   APPearance HAZY (A) CLEAR   Specific Gravity, Urine 1.017 1.005 - 1.030   pH 6.0 5.0 - 8.0   Glucose, UA NEGATIVE NEGATIVE mg/dL   Hgb urine dipstick NEGATIVE  NEGATIVE   Bilirubin Urine NEGATIVE NEGATIVE   Ketones, ur NEGATIVE NEGATIVE mg/dL   Protein, ur NEGATIVE NEGATIVE mg/dL   Nitrite NEGATIVE NEGATIVE   Leukocytes, UA NEGATIVE NEGATIVE  Protein / creatinine ratio, urine     Status: Abnormal   Collection Time: 06/17/18  8:17 PM  Result Value Ref Range   Creatinine, Urine 165.00 mg/dL   Total Protein, Urine 26 mg/dL   Protein Creatinine Ratio 0.16 (H) 0.00 - 0.15 mg/mg[Cre]  CBC     Status: Abnormal   Collection Time: 06/17/18 11:48 PM  Result Value Ref Range   WBC 17.2 (H) 4.0 - 10.5 K/uL   RBC 3.92 3.87 - 5.11 MIL/uL   Hemoglobin 12.1 12.0 - 15.0 g/dL   HCT 35.2 (L) 36.0 - 46.0 %   MCV 89.8 80.0 - 100.0 fL   MCH 30.9 26.0 - 34.0 pg   MCHC 34.4 30.0 - 36.0 g/dL   RDW 13.7 11.5 - 15.5 %   Platelets 323 150 - 400 K/uL   nRBC 0.0 0.0 - 0.2 %  Comprehensive metabolic panel     Status: Abnormal   Collection Time: 06/17/18 11:48 PM  Result Value Ref Range   Sodium 135 135 - 145 mmol/L   Potassium 3.4 (L) 3.5 - 5.1 mmol/L   Chloride 106 98 - 111 mmol/L   CO2 21 (L) 22 - 32 mmol/L   Glucose, Bld 93 70 - 99 mg/dL   BUN 8 6 - 20 mg/dL   Creatinine, Ser 0.53 0.44 - 1.00 mg/dL   Calcium 8.9 8.9 - 10.3 mg/dL   Total Protein 6.4 (L) 6.5 - 8.1 g/dL   Albumin 2.7 (L) 3.5 - 5.0 g/dL   AST 12 (L) 15 - 41 U/L   ALT 11 0 - 44 U/L   Alkaline  Phosphatase 110 38 - 126 U/L   Total Bilirubin 0.5 0.3 - 1.2 mg/dL   GFR calc non Af Amer >60 >60 mL/min   GFR calc Af Amer >60 >60 mL/min   Anion gap 8 5 - 15  Type and screen [place]     Status: None   Collection Time: 06/18/18 12:26 AM  Result Value Ref Range   ABO/RH(D) O POS    Antibody Screen NEG    Sample Expiration      06/21/2018 Performed at [facility], [address] [place], [place] [address]     Imaging:    MAU Course/MDM: I have ordered labs and reviewed results.  Preeclampsia labs normal  NST reviewed and is reactive Consult Dr Nehemiah Settle with  presentation, exam findings and test results.   Assessment: 1. Supervision of high risk pregnancy, antepartum   2.      Preeclampsia with severe features, (Hypertension and headache)  Plan: Admit for Magnesium sulfate and observation Betamethasone MD to follow  Hansel Feinstein CNM, MSN Certified Nurse-Midwife 06/17/2018 11:40 PM

## 2018-06-17 NOTE — MAU Note (Signed)
Phone call to house coverage for a cab voucher for patient and her significant other. Pt's significant other states,"the last bus runs at 11:00pm and we don't have another way to get home afterwards". House coverage will send voucher. Pt states", was told by her current OB provider to check her CBG's four times daily due to h/o hypoglycemia and not a diagnosis of GDM".  Pt has been unable to check her CBG's because she has been without a meter for over a year. Pt states,"I keep candy in my bag when I feel like my sugar is low". Hansel Feinstein CNM notified.    Gilmer Mor RN

## 2018-06-18 DIAGNOSIS — Z3A31 31 weeks gestation of pregnancy: Secondary | ICD-10-CM | POA: Diagnosis not present

## 2018-06-18 DIAGNOSIS — R1031 Right lower quadrant pain: Secondary | ICD-10-CM | POA: Diagnosis present

## 2018-06-18 DIAGNOSIS — O99333 Smoking (tobacco) complicating pregnancy, third trimester: Secondary | ICD-10-CM | POA: Diagnosis present

## 2018-06-18 DIAGNOSIS — F17210 Nicotine dependence, cigarettes, uncomplicated: Secondary | ICD-10-CM | POA: Diagnosis present

## 2018-06-18 DIAGNOSIS — O1413 Severe pre-eclampsia, third trimester: Secondary | ICD-10-CM | POA: Diagnosis present

## 2018-06-18 DIAGNOSIS — O139 Gestational [pregnancy-induced] hypertension without significant proteinuria, unspecified trimester: Secondary | ICD-10-CM | POA: Diagnosis present

## 2018-06-18 LAB — CBC
HCT: 34.9 % — ABNORMAL LOW (ref 36.0–46.0)
HEMOGLOBIN: 12.2 g/dL (ref 12.0–15.0)
MCH: 31.5 pg (ref 26.0–34.0)
MCHC: 35 g/dL (ref 30.0–36.0)
MCV: 90.2 fL (ref 80.0–100.0)
Platelets: 307 10*3/uL (ref 150–400)
RBC: 3.87 MIL/uL (ref 3.87–5.11)
RDW: 13.7 % (ref 11.5–15.5)
WBC: 18.8 10*3/uL — ABNORMAL HIGH (ref 4.0–10.5)
nRBC: 0 % (ref 0.0–0.2)

## 2018-06-18 LAB — COMPREHENSIVE METABOLIC PANEL
ALBUMIN: 2.7 g/dL — AB (ref 3.5–5.0)
ALK PHOS: 110 U/L (ref 38–126)
ALT: 10 U/L (ref 0–44)
ALT: 11 U/L (ref 0–44)
ANION GAP: 8 (ref 5–15)
ANION GAP: 9 (ref 5–15)
AST: 12 U/L — ABNORMAL LOW (ref 15–41)
AST: 12 U/L — ABNORMAL LOW (ref 15–41)
Albumin: 2.8 g/dL — ABNORMAL LOW (ref 3.5–5.0)
Alkaline Phosphatase: 111 U/L (ref 38–126)
BILIRUBIN TOTAL: 0.5 mg/dL (ref 0.3–1.2)
BUN: 7 mg/dL (ref 6–20)
BUN: 8 mg/dL (ref 6–20)
CALCIUM: 8.9 mg/dL (ref 8.9–10.3)
CHLORIDE: 105 mmol/L (ref 98–111)
CO2: 20 mmol/L — ABNORMAL LOW (ref 22–32)
CO2: 21 mmol/L — ABNORMAL LOW (ref 22–32)
Calcium: 8.3 mg/dL — ABNORMAL LOW (ref 8.9–10.3)
Chloride: 106 mmol/L (ref 98–111)
Creatinine, Ser: 0.53 mg/dL (ref 0.44–1.00)
Creatinine, Ser: 0.57 mg/dL (ref 0.44–1.00)
GFR calc Af Amer: >60 mL/min (ref >60)
GLUCOSE: 93 mg/dL (ref 70–99)
Glucose, Bld: 132 mg/dL — ABNORMAL HIGH (ref 70–99)
POTASSIUM: 3.4 mmol/L — AB (ref 3.5–5.1)
POTASSIUM: 3.5 mmol/L (ref 3.5–5.1)
Sodium: 134 mmol/L — ABNORMAL LOW (ref 135–145)
Sodium: 135 mmol/L (ref 135–145)
TOTAL PROTEIN: 6.4 g/dL — AB (ref 6.5–8.1)
TOTAL PROTEIN: 6.5 g/dL (ref 6.5–8.1)
Total Bilirubin: 0.7 mg/dL (ref 0.3–1.2)

## 2018-06-18 LAB — TYPE AND SCREEN
ABO/RH(D): O POS
ANTIBODY SCREEN: NEGATIVE

## 2018-06-18 LAB — PROTEIN / CREATININE RATIO, URINE
Creatinine, Urine: 165 mg/dL
PROTEIN CREATININE RATIO: 0.16 mg/mg{creat} — AB (ref 0.00–0.15)
Total Protein, Urine: 26 mg/dL

## 2018-06-18 LAB — ABO/RH: ABO/RH(D): O POS

## 2018-06-18 MED ORDER — BUTALBITAL-APAP-CAFFEINE 50-325-40 MG PO TABS
2.0000 | ORAL_TABLET | Freq: Four times a day (QID) | ORAL | Status: DC | PRN
  Administered 2018-06-19: 2 via ORAL
  Filled 2018-06-18: qty 2

## 2018-06-18 MED ORDER — ACETAMINOPHEN 325 MG PO TABS
650.0000 mg | ORAL_TABLET | ORAL | Status: DC | PRN
  Administered 2018-06-18 (×2): 650 mg via ORAL
  Filled 2018-06-18 (×2): qty 2

## 2018-06-18 MED ORDER — BETAMETHASONE SOD PHOS & ACET 6 (3-3) MG/ML IJ SUSP
12.0000 mg | INTRAMUSCULAR | Status: AC
  Administered 2018-06-18 – 2018-06-19 (×2): 12 mg via INTRAMUSCULAR
  Filled 2018-06-18 (×2): qty 2

## 2018-06-18 MED ORDER — LEVETIRACETAM 500 MG PO TABS
500.0000 mg | ORAL_TABLET | Freq: Two times a day (BID) | ORAL | Status: DC
  Administered 2018-06-18 – 2018-06-19 (×4): 500 mg via ORAL
  Filled 2018-06-18 (×6): qty 1

## 2018-06-18 MED ORDER — CALCIUM CARBONATE ANTACID 500 MG PO CHEW: 2.0000 | CHEWABLE_TABLET | ORAL | Status: DC | PRN

## 2018-06-18 MED ORDER — LABETALOL HCL 5 MG/ML IV SOLN: 80.0000 mg | INTRAVENOUS | Status: DC | PRN

## 2018-06-18 MED ORDER — ENOXAPARIN SODIUM 40 MG/0.4ML ~~LOC~~ SOLN
40.0000 mg | SUBCUTANEOUS | Status: DC
  Administered 2018-06-18 – 2018-06-19 (×2): 40 mg via SUBCUTANEOUS
  Filled 2018-06-18 (×2): qty 0.4

## 2018-06-18 MED ORDER — LABETALOL HCL 5 MG/ML IV SOLN
20.0000 mg | INTRAVENOUS | Status: DC | PRN
  Administered 2018-06-18: 20 mg via INTRAVENOUS
  Filled 2018-06-18: qty 4

## 2018-06-18 MED ORDER — ZOLPIDEM TARTRATE 5 MG PO TABS: 5.0000 mg | ORAL_TABLET | Freq: Every evening | ORAL | Status: DC | PRN

## 2018-06-18 MED ORDER — DOCUSATE SODIUM 100 MG PO CAPS
100.0000 mg | ORAL_CAPSULE | Freq: Every day | ORAL | Status: DC
  Administered 2018-06-18 – 2018-06-19 (×2): 100 mg via ORAL
  Filled 2018-06-18 (×3): qty 1

## 2018-06-18 MED ORDER — LACTATED RINGERS IV SOLN
INTRAVENOUS | Status: DC
  Administered 2018-06-18 (×2): via INTRAVENOUS

## 2018-06-18 MED ORDER — FOLIC ACID 1 MG PO TABS
1.0000 mg | ORAL_TABLET | Freq: Every day | ORAL | Status: DC
  Administered 2018-06-18: 1 mg via ORAL
  Filled 2018-06-18 (×3): qty 1

## 2018-06-18 MED ORDER — MAGNESIUM SULFATE BOLUS VIA INFUSION
4.0000 g | Freq: Once | INTRAVENOUS | Status: AC
  Administered 2018-06-18: 4 g via INTRAVENOUS
  Filled 2018-06-18: qty 500

## 2018-06-18 MED ORDER — HYDROXYZINE PAMOATE 50 MG PO CAPS
50.0000 mg | ORAL_CAPSULE | Freq: Every day | ORAL | Status: DC
  Administered 2018-06-18 (×2): 50 mg via ORAL
  Filled 2018-06-18 (×3): qty 1

## 2018-06-18 MED ORDER — PRENATAL MULTIVITAMIN CH
1.0000 | ORAL_TABLET | Freq: Every day | ORAL | Status: DC
  Administered 2018-06-18: 1 via ORAL
  Filled 2018-06-18 (×2): qty 1

## 2018-06-18 MED ORDER — ALBUTEROL SULFATE (2.5 MG/3ML) 0.083% IN NEBU: 3.0000 mL | INHALATION_SOLUTION | Freq: Four times a day (QID) | RESPIRATORY_TRACT | Status: DC | PRN

## 2018-06-18 MED ORDER — HYDRALAZINE HCL 20 MG/ML IJ SOLN: 10.0000 mg | INTRAMUSCULAR | Status: DC | PRN

## 2018-06-18 MED ORDER — MAGNESIUM SULFATE 40 G IN LACTATED RINGERS - SIMPLE
2.0000 g/h | INTRAVENOUS | Status: DC
  Administered 2018-06-18: 2 g/h via INTRAVENOUS
  Filled 2018-06-18: qty 500

## 2018-06-18 MED ORDER — LABETALOL HCL 5 MG/ML IV SOLN: 40.0000 mg | INTRAVENOUS | Status: DC | PRN

## 2018-06-18 NOTE — H&P (Signed)
Chief Complaint:  Headache; Nausea; and Abdominal Pain   First Provider Initiated Contact with Patient 06/17/18 2339     HPI: Amanda [name] is a 37 y.o. G2P0010 at 61w5dwho presents to maternity admissions reporting pain on right lower abdomen and a headache which she has had for several days.  States headaches are usually due to "low blood sugar" and usually get better with eating, but this time it did not.  Has had right leg pain but this is chronic.Marland Kitchen She reports good fetal movement, denies LOF, vaginal bleeding, vaginal itching/burning, urinary symptoms, h/a, dizziness, n/v, diarrhea, constipation or fever/chills.  She denies visual changes or RUQ abdominal pain.  Headache   This is a recurrent problem. The current episode started in the past 7 days. The problem occurs constantly. The problem has been unchanged. The quality of the pain is described as aching. Associated symptoms include abdominal pain. Pertinent negatives include no back pain, blurred vision, dizziness, fever, muscle aches, nausea, photophobia, sinus pressure or visual change. Nothing aggravates the symptoms. She has tried nothing (eating) for the symptoms. The treatment provided no relief.  Abdominal Pain  This is a new problem. The current episode started in the past 7 days. The onset quality is gradual. The problem occurs intermittently. The pain is located in the RLQ. The pain is mild. The quality of the pain is cramping. The abdominal pain does not radiate. Associated symptoms include headaches. Pertinent negatives include no fever or nausea. Nothing aggravates the pain. The pain is relieved by nothing. She has tried nothing for the symptoms.    RN note: Pt here with pain in right side and lower abdominal area, headache for couple days, right leg pain. Denies any leaking or bleeding. Reports fetal movement.   Past Medical History: Past Medical History:  Diagnosis Date  . Anxiety   . Asthma   . Asthma due to  environmental allergies   . Asthma due to seasonal allergies   . Bipolar 1 disorder ([place])   . Borderline personality disorder ([place])   . Brain bleed ([place])   . Chronic post-traumatic stress disorder (PTSD)    complex chronic with psychotic features and self harm behaviors  . Dander (animal) allergy   . Hearing loss    right ear  . Heart murmur   . Hip dysplasia   . Major depression, chronic   . Mood disorder ([place])   . OCD (obsessive compulsive disorder)   . RA (rheumatoid arthritis) ([place])   . Rheumatoid arthritis ([place])   . Seizure disorder ([place])   . Suicidal ideations   . Suicide attempt [facility])     Past obstetric history: OB History  Gravida Para Term Preterm AB Living  2 0 0 0 1 0  SAB TAB Ectopic Multiple Live Births  1 0 0        # Outcome Date GA Lbr Len/2nd Weight Sex Delivery Anes PTL Lv  2 Current           1 SAB  [redacted]w[redacted]d   M        Past Surgical History: Past Surgical History:  Procedure Laterality Date  . EYE SURGERY  1985  . SHUNT REMOVAL  1983  . Tooth Removal     multiple    Family History: Family History  Problem Relation Age of Onset  . Heart attack Mother   . Stroke Mother   . Liver cancer Mother   . Diabetes Mother   . Lung cancer Mother   .  Alcoholism Father   . Sleep apnea Brother   . Depression Brother   . ADD / ADHD Brother   . Diabetes Maternal Aunt   . Diabetes Paternal Uncle   . Diabetes Maternal Grandmother     Social History: Social History   Tobacco Use  . Smoking status: Current Every Day Smoker    Packs/day: 0.25    Types: Cigarettes  . Smokeless tobacco: Never Used  Substance Use Topics  . Alcohol use: Not Currently    Frequency: Never  . Drug use: Not Currently    Types: Marijuana    Comment:  last used in Jan 2019 due to "RA" per pt    Allergies:  Allergies  Allergen Reactions  . Aspartame And Phenylalanine Anaphylaxis, Hives and Diarrhea  . Benadryl [Diphenhydramine] Anaphylaxis, Diarrhea and Other (See  Comments)    Blisters also  . Other Anaphylaxis, Nausea And Vomiting, Rash and Other (See Comments)    Aspartame- Blisters, also Dust- Worsens asthma Ragweed- Worsens asthma, face gets red, and sneezing Animal Fur/Dander- Worsens asthma and sneezing    . Scallops [Shellfish Allergy] Anaphylaxis, Diarrhea and Nausea And Vomiting  . Yellow Jacket Venom [Bee Venom] Anaphylaxis, Diarrhea and Nausea And Vomiting    Seizures and numbness  . Pollen Extract Other (See Comments)    Runny nose, eyes, and asthma worsens  . Tegretol [Carbamazepine] Hives, Diarrhea and Other (See Comments)    Blisters in mouth and increase in seizures  . Adhesive [Tape] Rash  . Latex Hives and Rash    Blisters, also- Condoms and dental encounters    Meds:  Medications Prior to Admission  Medication Sig Dispense Refill Last Dose  . acetaminophen (TYLENOL) 325 MG tablet Take 325-650 mg by mouth every 6 (six) hours as needed (for pain).   Taking  . albuterol (PROVENTIL HFA;VENTOLIN HFA) 108 (90 Base) MCG/ACT inhaler Inhale 2 puffs into the lungs every 6 (six) hours as needed for wheezing or shortness of breath.   Taking  . EPINEPHrine 0.3 mg/0.3 mL IJ SOAJ injection Inject 0.3 mg into the muscle as needed (for allergic reaction).    Not Taking  . folic acid (FOLVITE) 1 MG tablet Take 1 tablet (1 mg total) by mouth daily. 90 tablet 2 Taking  . hydrOXYzine (VISTARIL) 50 MG capsule Take 1 capsule (50 mg total) by mouth at bedtime. 90 capsule 2 Taking  . levETIRAcetam (KEPPRA) 500 MG tablet Take 1 tablet (500 mg total) by mouth 2 (two) times daily. 30 tablet 0 Taking  . Prenatal Multivit-Min-Fe-FA (PRENATAL VITAMINS) 0.8 MG tablet Take 1 tablet by mouth daily. 30 tablet 12 Taking    I have reviewed patient's Past Medical Hx, Surgical Hx, Family Hx, Social Hx, medications and allergies.   ROS:  Review of Systems  Constitutional: Negative for fever.  HENT: Negative for sinus pressure.   Eyes: Negative for blurred  vision and photophobia.  Gastrointestinal: Positive for abdominal pain. Negative for nausea.  Musculoskeletal: Negative for back pain.  Neurological: Positive for headaches. Negative for dizziness.   Other systems negative  Physical Exam   Patient Vitals for the past 24 hrs:  BP Temp Temp src Pulse Resp SpO2 Height Weight  06/17/18 2330 (!) 165/73 - - 68 - - - -  06/17/18 2315 (!) 151/79 - - 78 - - - -  06/17/18 2300 (!) 159/79 - - 78 - - - -  06/17/18 2245 (!) 161/82 - - 82 - - - -  06/17/18 2228  119/60 - - 86 - - - -  06/17/18 2223 - - - - - 97 % - -  06/17/18 2207 (!) 109/96 - - 85 - - - -  06/17/18 1927 140/71 98.3 F (36.8 C) Oral 86 18 95 % 5\' 8"  (1.727 m) 98.4 kg   Vitals:   06/18/18 0035 06/18/18 0051 06/18/18 0110 06/18/18 0131  BP:  122/81 119/73 109/64  Pulse:  78 71 75  Resp:  17 18   Temp:  (!) 97.5 F (36.4 C)    TempSrc:  Oral    SpO2: 95% 98%    Weight:      Height:        Constitutional: Well-developed, well-nourished female in no acute distress.  Cardiovascular: normal rate and rhythm  Loud heart murmur Respiratory: normal effort, clear to auscultation bilaterally GI: Abd soft, non-tender, gravid appropriate for gestational age.   No rebound or guarding. MS: Extremities nontender, no edema, normal ROM Neurologic: Alert and oriented x 4.  GU: Neg CVAT.  PELVIC EXAM:  deferred  FHT:  Baseline 140 , moderate variability, accelerations present, no decelerations Contractions: occasional, Irregular    Labs: O/Positive/-- (05/22 1317) . Results for orders placed or performed during the hospital encounter of 06/17/18 (from the past 24 hour(s))  Urinalysis, Routine w reflex microscopic     Status: Abnormal   Collection Time: 06/17/18  7:35 PM  Result Value Ref Range   Color, Urine YELLOW YELLOW   APPearance HAZY (A) CLEAR   Specific Gravity, Urine 1.017 1.005 - 1.030   pH 6.0 5.0 - 8.0   Glucose, UA NEGATIVE NEGATIVE mg/dL   Hgb urine dipstick  NEGATIVE NEGATIVE   Bilirubin Urine NEGATIVE NEGATIVE   Ketones, ur NEGATIVE NEGATIVE mg/dL   Protein, ur NEGATIVE NEGATIVE mg/dL   Nitrite NEGATIVE NEGATIVE   Leukocytes, UA NEGATIVE NEGATIVE  Protein / creatinine ratio, urine     Status: Abnormal   Collection Time: 06/17/18  8:17 PM  Result Value Ref Range   Creatinine, Urine 165.00 mg/dL   Total Protein, Urine 26 mg/dL   Protein Creatinine Ratio 0.16 (H) 0.00 - 0.15 mg/mg[Cre]  CBC     Status: Abnormal   Collection Time: 06/17/18 11:48 PM  Result Value Ref Range   WBC 17.2 (H) 4.0 - 10.5 K/uL   RBC 3.92 3.87 - 5.11 MIL/uL   Hemoglobin 12.1 12.0 - 15.0 g/dL   HCT 35.2 (L) 36.0 - 46.0 %   MCV 89.8 80.0 - 100.0 fL   MCH 30.9 26.0 - 34.0 pg   MCHC 34.4 30.0 - 36.0 g/dL   RDW 13.7 11.5 - 15.5 %   Platelets 323 150 - 400 K/uL   nRBC 0.0 0.0 - 0.2 %  Comprehensive metabolic panel     Status: Abnormal   Collection Time: 06/17/18 11:48 PM  Result Value Ref Range   Sodium 135 135 - 145 mmol/L   Potassium 3.4 (L) 3.5 - 5.1 mmol/L   Chloride 106 98 - 111 mmol/L   CO2 21 (L) 22 - 32 mmol/L   Glucose, Bld 93 70 - 99 mg/dL   BUN 8 6 - 20 mg/dL   Creatinine, Ser 0.53 0.44 - 1.00 mg/dL   Calcium 8.9 8.9 - 10.3 mg/dL   Total Protein 6.4 (L) 6.5 - 8.1 g/dL   Albumin 2.7 (L) 3.5 - 5.0 g/dL   AST 12 (L) 15 - 41 U/L   ALT 11 0 - 44 U/L   Alkaline  Phosphatase 110 38 - 126 U/L   Total Bilirubin 0.5 0.3 - 1.2 mg/dL   GFR calc non Af Amer >60 >60 mL/min   GFR calc Af Amer >60 >60 mL/min   Anion gap 8 5 - 15  Type and screen [place]     Status: None   Collection Time: 06/18/18 12:26 AM  Result Value Ref Range   ABO/RH(D) O POS    Antibody Screen NEG    Sample Expiration      06/21/2018 Performed at [facility], [address] [place], [place] [address]     Imaging:    MAU Course/MDM: I have ordered labs and reviewed results.  Preeclampsia labs normal  NST reviewed and is reactive Consult Dr Nehemiah Settle  with presentation, exam findings and test results.   Assessment: 1. Supervision of high risk pregnancy, antepartum   2.      Preeclampsia with severe features, (Hypertension and headache)  Plan: Admit for Magnesium sulfate and observation Betamethasone MD to follow  Hansel Feinstein CNM, MSN Certified Nurse-Midwife 06/17/2018 11:40 PM  Attestation of Attending Supervision of Advanced Practitioner (PA/CNM/NP): Evaluation and management procedures were performed by the Advanced Practitioner under my supervision and collaboration.  I have reviewed the Advanced Practitioner's note and chart, and I agree with the management and plan.  Loma Boston, DO Attending [facility], [place]

## 2018-06-18 NOTE — Plan of Care (Signed)

## 2018-06-19 DIAGNOSIS — O1413 Severe pre-eclampsia, third trimester: Principal | ICD-10-CM

## 2018-06-19 MED ORDER — BUTALBITAL-APAP-CAFFEINE 50-325-40 MG PO TABS: 2.0000 | ORAL_TABLET | Freq: Four times a day (QID) | ORAL | 0 refills | Status: DC | PRN

## 2018-06-19 NOTE — Discharge Instructions (Signed)
Preeclampsia and Eclampsia °Preeclampsia is a serious condition that develops only during pregnancy. It is also called toxemia of pregnancy. This condition causes high blood pressure along with other symptoms, such as swelling and headaches. These symptoms may develop as the condition gets worse. Preeclampsia may occur at 20 weeks of pregnancy or later. °Diagnosing and treating preeclampsia early is very important. If not treated early, it can cause serious problems for you and your baby. One problem it can lead to is eclampsia, which is a condition that causes muscle jerking or shaking (convulsions or seizures) in the mother. Delivering your baby is the best treatment for preeclampsia or eclampsia. Preeclampsia and eclampsia symptoms usually go away after your baby is born. °What are the causes? °The cause of preeclampsia is not known. °What increases the risk? °The following risk factors make you more likely to develop preeclampsia: °· Being pregnant for the first time. °· Having had preeclampsia during a past pregnancy. °· Having a family history of preeclampsia. °· Having high blood pressure. °· Being pregnant with twins or triplets. °· Being 35 or older. °· Being African-American. °· Having kidney disease or diabetes. °· Having medical conditions such as lupus or blood diseases. °· Being very overweight (obese). ° °What are the signs or symptoms? °The earliest signs of preeclampsia are: °· High blood pressure. °· Increased protein in your urine. Your health care provider will check for this at every visit before you give birth (prenatal visit). ° °Other symptoms that may develop as the condition gets worse include: °· Severe headaches. °· Sudden weight gain. °· Swelling of the hands, face, legs, and feet. °· Nausea and vomiting. °· Vision problems, such as blurred or double vision. °· Numbness in the face, arms, legs, and feet. °· Urinating less than usual. °· Dizziness. °· Slurred speech. °· Abdominal pain,  especially upper abdominal pain. °· Convulsions or seizures. ° °Symptoms generally go away after giving birth. °How is this diagnosed? °There are no screening tests for preeclampsia. Your health care provider will ask you about symptoms and check for signs of preeclampsia during your prenatal visits. You may also have tests that include: °· Urine tests. °· Blood tests. °· Checking your blood pressure. °· Monitoring your baby’s heart rate. °· Ultrasound. ° °How is this treated? °You and your health care provider will determine the treatment approach that is best for you. Treatment may include: °· Having more frequent prenatal exams to check for signs of preeclampsia, if you have an increased risk for preeclampsia. °· Bed rest. °· Reducing how much salt (sodium) you eat. °· Medicine to lower your blood pressure. °· Staying in the hospital, if your condition is severe. There, treatment will focus on controlling your blood pressure and the amount of fluids in your body (fluid retention). °· You may need to take medicine (magnesium sulfate) to prevent seizures. This medicine may be given as an injection or through an IV tube. °· Delivering your baby early, if your condition gets worse. You may have your labor started with medicine (induced), or you may have a cesarean delivery. ° °Follow these instructions at home: °Eating and drinking ° °· Drink enough fluid to keep your urine clear or pale yellow. °· Eat a healthy diet that is low in sodium. Do not add salt to your food. Check nutrition labels to see how much sodium a food or beverage contains. °· Avoid caffeine. °Lifestyle °· Do not use any products that contain nicotine or tobacco, such as cigarettes   and e-cigarettes. If you need help quitting, ask your health care provider. °· Do not use alcohol or drugs. °· Avoid stress as much as possible. Rest and get plenty of sleep. °General instructions °· Take over-the-counter and prescription medicines only as told by your  health care provider. °· When lying down, lie on your side. This keeps pressure off of your baby. °· When sitting or lying down, raise (elevate) your feet. Try putting some pillows underneath your lower legs. °· Exercise regularly. Ask your health care provider what kinds of exercise are best for you. °· Keep all follow-up and prenatal visits as told by your health care provider. This is important. °How is this prevented? °To prevent preeclampsia or eclampsia from developing during another pregnancy: °· Get proper medical care during pregnancy. Your health care provider may be able to prevent preeclampsia or diagnose and treat it early. °· Your health care provider may have you take a low-dose aspirin or a calcium supplement during your next pregnancy. °· You may have tests of your blood pressure and kidney function after giving birth. °· Maintain a healthy weight. Ask your health care provider for help managing weight gain during pregnancy. °· Work with your health care provider to manage any long-term (chronic) health conditions you have, such as diabetes or kidney problems. ° °Contact a health care provider if: °· You gain more weight than expected. °· You have headaches. °· You have nausea or vomiting. °· You have abdominal pain. °· You feel dizzy or light-headed. °Get help right away if: °· You develop sudden or severe swelling anywhere in your body. This usually happens in the legs. °· You gain 5 lbs (2.3 kg) or more during one week. °· You have severe: °? Abdominal pain. °? Headaches. °? Dizziness. °? Vision problems. °? Confusion. °? Nausea or vomiting. °· You have a seizure. °· You have trouble moving any part of your body. °· You develop numbness in any part of your body. °· You have trouble speaking. °· You have any abnormal bleeding. °· You pass out. °This information is not intended to replace advice given to you by your health care provider. Make sure you discuss any questions you have with your health  care provider. °Document Released: 08/03/2000 Document Revised: 04/03/2016 Document Reviewed: 03/12/2016 °[employer] Interactive Patient Education © 2018 [employer] ° °

## 2018-06-19 NOTE — Discharge Summary (Signed)
Physician Discharge Summary  Patient ID: Amanda [name] MRN: [id number] DOB/AGE: 1981-08-16 37 y.o.  Admit date: 06/17/2018 Discharge date: 06/19/2018  Admission Diagnoses:Preeclampsia  Discharge Diagnoses:  Gestational hypertension  Discharged Condition: good  Hospital Course:   Chief Complaint:  Headache; Nausea; and Abdominal Pain      First Provider Initiated Contact with Patient 06/17/18 2339       HPI: Amanda [name] is a 37 y.o. G2P0010 at 14w5dwho presents to maternity admissions reporting pain on right lower abdomen and a headache which she has had for several days.  States headaches are usually due to "low blood sugar" and usually get better with eating, but this time it did not.  Has had right leg pain but this is chronic.Marland Kitchen  She reports good fetal movement, denies LOF, vaginal bleeding, vaginal itching/burning, urinary symptoms, h/a, dizziness, n/v, diarrhea, constipation or fever/chills.  She denies visual changes or RUQ abdominal pain.     Headache    This is a recurrent problem. The current episode started in the past 7 days. The problem occurs constantly. The problem has been unchanged. The quality of the pain is described as aching. Associated symptoms include abdominal pain. Pertinent negatives include no back pain, blurred vision, dizziness, fever, muscle aches, nausea, photophobia, sinus pressure or visual change. Nothing aggravates the symptoms. She has tried nothing (eating) for the symptoms. The treatment provided no relief.   Abdominal Pain   This is a new problem. The current episode started in the past 7 days. The onset quality is gradual. The problem occurs intermittently. The pain is located in the RLQ. The pain is mild. The quality of the pain is cramping. The abdominal pain does not radiate. Associated symptoms include headaches. Pertinent negatives include no fever or nausea. Nothing aggravates the pain. The pain is relieved by nothing. She has tried  nothing for the symptoms.         RN note:  Pt here with pain in right side and lower abdominal area, headache for couple days, right leg pain. Denies any leaking or bleeding. Reports fetal movement.        Past Medical History:       Past Medical History:    Diagnosis   Date    .   Anxiety        .   Asthma        .   Asthma due to environmental allergies        .   Asthma due to seasonal allergies        .   Bipolar 1 disorder ([place])        .   Borderline personality disorder ([place])        .   Brain bleed ([place])        .   Chronic post-traumatic stress disorder (PTSD)            complex chronic with psychotic features and self harm behaviors    .   Dander (animal) allergy        .   Hearing loss            right ear    .   Heart murmur        .   Hip dysplasia        .   Major depression, chronic        .   Mood disorder ([place])        .   OCD (obsessive  compulsive disorder)        .   RA (rheumatoid arthritis) ([place])        .   Rheumatoid arthritis ([place])        .   Seizure disorder ([place])        .   Suicidal ideations        .   Suicide attempt [facility])              Past obstetric history:                   OB History    Gravida   Para   Term   Preterm   AB   Living    2   0   0   0   1   0    SAB   TAB   Ectopic   Multiple   Live Births    1   0   0                 #   Outcome   Date   GA   Lbr Len/2nd   Weight   Sex   Delivery   Anes   PTL   Lv    2   Current                                        1   SAB       [redacted]w[redacted]d           M                          Past Surgical History:        Past Surgical History:    Procedure   Laterality   Date    .   EYE SURGERY       1985    .   SHUNT REMOVAL        1983    .   Tooth Removal                multiple          Family History:        Family History    Problem   Relation   Age of Onset    .   Heart attack   Mother        .   Stroke   Mother        .   Liver cancer   Mother        .   Diabetes   Mother        .   Lung cancer   Mother        .   Alcoholism   Father        .   Sleep apnea   Brother        .   Depression   Brother        .   ADD / ADHD   Brother        .   Diabetes   Maternal Aunt        .   Diabetes   Paternal Uncle        .   Diabetes   Maternal Grandmother  Social History:   Social History             Tobacco Use    .   Smoking status:   Current Every Day Smoker            Packs/day:   0.25            Types:   Cigarettes    .   Smokeless tobacco:   Never Used    Substance Use Topics    .   Alcohol use:   Not Currently            Frequency:   Never    .   Drug use:   Not Currently            Types:   Marijuana            Comment:  last used in Jan 2019 due to "RA" per pt          Allergies:         Allergies    Allergen   Reactions    .   Aspartame And Phenylalanine   Anaphylaxis, Hives and Diarrhea    .   Benadryl [Diphenhydramine]   Anaphylaxis, Diarrhea and Other (See Comments)            Blisters also    .   Other   Anaphylaxis, Nausea And Vomiting, Rash and Other (See Comments)            Aspartame- Blisters, also  Dust- Worsens asthma  Ragweed- Worsens asthma, face gets red, and sneezing  Animal Fur/Dander- Worsens asthma and sneezing          .   Scallops [Shellfish Allergy]   Anaphylaxis, Diarrhea and Nausea And Vomiting    .   Yellow Jacket Venom [Bee Venom]   Anaphylaxis, Diarrhea and Nausea And Vomiting             Seizures and numbness    .   Pollen Extract   Other (See Comments)            Runny nose, eyes, and asthma worsens    .   Tegretol [Carbamazepine]   Hives, Diarrhea and Other (See Comments)            Blisters in mouth and increase in seizures    .   Adhesive [Tape]   Rash    .   Latex   Hives and Rash            Blisters, also- Condoms and dental encounters          Meds:           Medications Prior to Admission    Medication   Sig   Dispense   Refill   Last Dose    .   acetaminophen (TYLENOL) 325 MG tablet   Take 325-650 mg by mouth every 6 (six) hours as needed (for pain).           Taking    .   albuterol (PROVENTIL HFA;VENTOLIN HFA) 108 (90 Base) MCG/ACT inhaler   Inhale 2 puffs into the lungs every 6 (six) hours as needed for wheezing or shortness of breath.           Taking    .   EPINEPHrine 0.3 mg/0.3 mL IJ SOAJ injection   Inject 0.3 mg into the muscle as needed (for allergic reaction).  Not Taking    .   folic acid (FOLVITE) 1 MG tablet   Take 1 tablet (1 mg total) by mouth daily.   90 tablet   2   Taking    .   hydrOXYzine (VISTARIL) 50 MG capsule   Take 1 capsule (50 mg total) by mouth at bedtime.   90 capsule   2   Taking    .   levETIRAcetam (KEPPRA) 500 MG tablet   Take 1 tablet (500 mg total) by mouth 2 (two) times daily.   30 tablet   0   Taking    .   Prenatal Multivit-Min-Fe-FA (PRENATAL VITAMINS) 0.8 MG tablet   Take 1 tablet by mouth daily.   30 tablet   12   Taking          I have reviewed patient's Past Medical Hx, Surgical Hx, Family Hx, Social Hx, medications and allergies.      ROS:   Review of Systems   Constitutional: Negative for fever.   HENT: Negative for sinus pressure.    Eyes: Negative for blurred vision and photophobia.   Gastrointestinal: Positive for abdominal pain. Negative for  nausea.   Musculoskeletal: Negative for back pain.   Neurological: Positive for headaches. Negative for dizziness.      Other systems negative      Physical Exam       Patient Vitals for the past 24 hrs:       BP   Temp   Temp src   Pulse   Resp   SpO2   Height   Weight    06/17/18 2330   (!) 165/73   -   -   68   -   -   -   -    06/17/18 2315   (!) 151/79   -   -   78   -   -   -   -    06/17/18 2300   (!) 159/79   -   -   78   -   -   -   -    06/17/18 2245   (!) 161/82   -   -   82   -   -   -   -    06/17/18 2228   119/60   -   -   86   -   -   -   -    06/17/18 2223   -   -   -   -   -   97 %   -   -    06/17/18 2207   (!) 109/96   -   -   85   -   -   -   -    06/17/18 1927   140/71   98.3 F (36.8 C)   Oral   86   18   95 %   5\' 8"  (1.727 m)   98.4 kg              Vitals:        06/18/18 0035   06/18/18 0051   06/18/18 0110   06/18/18 0131    BP:       122/81   119/73   109/64    Pulse:       78   71   75    Resp:  17   18        Temp:       (!) 97.5 F (36.4 C)            TempSrc:       Oral            SpO2:   95%   98%            Weight:                    Height:                          Constitutional: Well-developed, well-nourished female in no acute distress.   Cardiovascular: normal rate and rhythm  Loud heart murmur  Respiratory: normal effort, clear to auscultation bilaterally  GI: Abd soft, non-tender, gravid appropriate for gestational age.   No rebound or guarding.  MS: Extremities nontender, no edema, normal ROM  Neurologic: Alert and oriented x 4.   GU: Neg CVAT.     PELVIC EXAM:  deferred     FHT:  Baseline 140 , moderate variability, accelerations present, no  decelerations  Contractions: occasional, Irregular      Labs:  O/Positive/-- (05/22 1317)  .         Results for orders placed or performed during the hospital encounter of 06/17/18 (from the past 24 hour(s))    Urinalysis, Routine w reflex microscopic     Status: Abnormal        Collection Time: 06/17/18  7:35 PM    Result   Value   Ref Range        Color, Urine   YELLOW   YELLOW        APPearance   HAZY (A)   CLEAR        Specific Gravity, Urine   1.017   1.005 - 1.030        pH   6.0   5.0 - 8.0        Glucose, UA   NEGATIVE   NEGATIVE mg/dL        Hgb urine dipstick   NEGATIVE   NEGATIVE        Bilirubin Urine   NEGATIVE   NEGATIVE        Ketones, ur   NEGATIVE   NEGATIVE mg/dL        Protein, ur   NEGATIVE   NEGATIVE mg/dL        Nitrite   NEGATIVE   NEGATIVE        Leukocytes, UA   NEGATIVE   NEGATIVE    Protein / creatinine ratio, urine     Status: Abnormal        Collection Time: 06/17/18  8:17 PM    Result   Value   Ref Range        Creatinine, Urine   165.00   mg/dL        Total Protein, Urine   26   mg/dL        Protein Creatinine Ratio   0.16 (H)   0.00 - 0.15 mg/mg[Cre]    CBC     Status: Abnormal        Collection Time: 06/17/18 11:48 PM    Result   Value   Ref Range        WBC   17.2 (H)   4.0 - 10.5 K/uL  RBC   3.92   3.87 - 5.11 MIL/uL        Hemoglobin   12.1   12.0 - 15.0 g/dL        HCT   35.2 (L)   36.0 - 46.0 %        MCV   89.8   80.0 - 100.0 fL        MCH   30.9   26.0 - 34.0 pg        MCHC   34.4   30.0 - 36.0 g/dL        RDW   13.7   11.5 - 15.5 %        Platelets   323   150 - 400 K/uL        nRBC   0.0   0.0 - 0.2 %    Comprehensive metabolic panel     Status: Abnormal        Collection Time: 06/17/18  11:48 PM    Result   Value   Ref Range        Sodium   135   135 - 145 mmol/L        Potassium   3.4 (L)   3.5 - 5.1 mmol/L        Chloride   106   98 - 111 mmol/L        CO2   21 (L)   22 - 32 mmol/L        Glucose, Bld   93   70 - 99 mg/dL        BUN   8   6 - 20 mg/dL        Creatinine, Ser   0.53   0.44 - 1.00 mg/dL        Calcium   8.9   8.9 - 10.3 mg/dL        Total Protein   6.4 (L)   6.5 - 8.1 g/dL        Albumin   2.7 (L)   3.5 - 5.0 g/dL        AST   12 (L)   15 - 41 U/L        ALT   11   0 - 44 U/L        Alkaline Phosphatase   110   38 - 126 U/L        Total Bilirubin   0.5   0.3 - 1.2 mg/dL        GFR calc non Af Amer   >60   >60 mL/min        GFR calc Af Amer   >60   >60 mL/min        Anion gap   8   5 - 15    Type and screen [place]     Status: None        Collection Time: 06/18/18 12:26 AM    Result   Value   Ref Range        ABO/RH(D)   O POS            Antibody Screen   NEG            Sample Expiration                    06/21/2018  Performed at [facility], [address], [place], [place] [address]  Imaging:         MAU Course/MDM:  I have ordered labs and reviewed results.  Preeclampsia labs normal   NST reviewed and is reactive  Consult Dr Nehemiah Settle with presentation, exam findings and test results.      Assessment:   1.   Supervision of high risk pregnancy, antepartum     2.      Preeclampsia with severe features, (Hypertension and headache)     Plan:  Admit for Magnesium sulfate and observation  Betamethasone  MD to follow     Hansel Feinstein CNM, MSN  Certified Nurse-Midwife  06/17/2018  11:40 PM     Attestation of Attending Supervision of Advanced Practitioner (PA/CNM/NP): Evaluation and  management procedures were performed by the Advanced Practitioner under my supervision and collaboration.  I have reviewed the Advanced Practitioner's note and chart, and I agree with the management and plan.     Loma Boston, DO  Attending Physician  [facility], [place]   After admission we discussed her history of chronic headache which has been fully evaluated previously. Her BP was normal during her stay and her labs were normal. Her diagnosis is gestational hypertension.        Consults: None  Significant Diagnostic Studies: labs:  CBC    Component Value Date/Time   WBC 18.8 (H) 06/18/2018 0542   RBC 3.87 06/18/2018 0542   HGB 12.2 06/18/2018 0542   HGB 13.3 05/23/2018 0910   HCT 34.9 (L) 06/18/2018 0542   HCT 38.2 05/23/2018 0910   PLT 307 06/18/2018 0542   PLT 353 05/23/2018 0910   MCV 90.2 06/18/2018 0542   MCV 91 05/23/2018 0910   MCH 31.5 06/18/2018 0542   MCHC 35.0 06/18/2018 0542   RDW 13.7 06/18/2018 0542   RDW 13.0 05/23/2018 0910   LYMPHSABS 3.3 04/04/2018 1804   LYMPHSABS 2.7 01/08/2018 1317   MONOABS 1.0 04/04/2018 1804   EOSABS 0.2 04/04/2018 1804   EOSABS 0.1 01/08/2018 1317   BASOSABS 0.1 04/04/2018 1804   BASOSABS 0.0 01/08/2018 1317   CMP Latest Ref Rng & Units 06/18/2018 06/17/2018 04/04/2018  Glucose 70 - 99 mg/dL 132(H) 93 82  BUN 6 - 20 mg/dL 7 8 <5(L)  Creatinine 0.44 - 1.00 mg/dL 0.57 0.53 0.52  Sodium 135 - 145 mmol/L 134(L) 135 139  Potassium 3.5 - 5.1 mmol/L 3.5 3.4(L) 3.6  Chloride 98 - 111 mmol/L 105 106 107  CO2 22 - 32 mmol/L 20(L) 21(L) 21(L)  Calcium 8.9 - 10.3 mg/dL 8.3(L) 8.9 9.0  Total Protein 6.5 - 8.1 g/dL 6.5 6.4(L) 6.3(L)  Total Bilirubin 0.3 - 1.2 mg/dL 0.7 0.5 0.9  Alkaline Phos 38 - 126 U/L 111 110 91  AST 15 - 41 U/L 12(L) 12(L) 15  ALT 0 - 44 U/L 10 11 13      Treatments: IV hydration  Discharge Exam: Blood pressure 114/67, pulse 76, temperature 97.6 F (36.4 C), temperature  source Oral, resp. rate 16, height 5\' 8"  (1.727 m), weight 98.4 kg, last menstrual period 11/07/2017, SpO2 97 %. General appearance: alert, cooperative and no distress Resp: normal effort Extremities: extremities normal, atraumatic, no cyanosis or edema  Disposition: Discharge disposition: 01-Home or Self Care        Allergies as of 06/19/2018      Reactions   Aspartame And Phenylalanine Anaphylaxis, Hives, Diarrhea   Benadryl [diphenhydramine] Anaphylaxis, Diarrhea, Other (See Comments)   Blisters also   Other Anaphylaxis, Nausea And Vomiting,  Rash, Other (See Comments)   Aspartame- Blisters, also Dust- Worsens asthma Ragweed- Worsens asthma, face gets red, and sneezing Animal Fur/Dander- Worsens asthma and sneezing   Scallops [shellfish Allergy] Anaphylaxis, Diarrhea, Nausea And Vomiting   Yellow Jacket Venom [bee Venom] Anaphylaxis, Diarrhea, Nausea And Vomiting   Seizures and numbness   Pollen Extract Other (See Comments)   Runny nose, eyes, and asthma worsens   Tegretol [carbamazepine] Hives, Diarrhea, Other (See Comments)   Blisters in mouth and increase in seizures   Adhesive [tape] Rash   Latex Hives, Rash   Blisters, also- Condoms and dental encounters      Medication List    STOP taking these medications   folic acid 1 MG tablet Commonly known as:  FOLVITE   hydrOXYzine 50 MG capsule Commonly known as:  VISTARIL     TAKE these medications   albuterol 108 (90 Base) MCG/ACT inhaler Commonly known as:  PROVENTIL HFA;VENTOLIN HFA Inhale 2 puffs into the lungs every 6 (six) hours as needed for wheezing or shortness of breath.   butalbital-acetaminophen-caffeine 50-325-40 MG tablet Commonly known as:  FIORICET, ESGIC Take 2 tablets by mouth every 6 (six) hours as needed for headache.   EPINEPHrine 0.3 mg/0.3 mL Soaj injection Commonly known as:  EPI-PEN Inject 0.3 mg into the muscle as needed (for allergic reaction).   levETIRAcetam 500 MG  tablet Commonly known as:  KEPPRA Take 1 tablet (500 mg total) by mouth 2 (two) times daily.   Prenatal Vitamins 0.8 MG tablet Take 1 tablet by mouth daily.      Follow-up Information    [facility] Follow up in 1 day(s).   Specialty:  Obstetrics and Gynecology Why:  Dr Gates Rigg information: [address] [place] [phone]          Signed: Emeterio Reeve 06/19/2018, 10:10 AM

## 2018-06-19 NOTE — Plan of Care (Signed)
Pt discharged with Pre-e s/s and verbalizes understanding

## 2018-06-19 NOTE — Progress Notes (Signed)
1145- Discharge instructions given to patient, pre-e s/s reviewed and pt verbalizes understanding. F/U appointment tomorrow. Pt requesting bus passes. LSW called to request bus passes. Patient given 2 passes and then discharged in her personal WC with FOB.

## 2018-06-20 ENCOUNTER — Encounter: Payer: Self-pay | Admitting: Obstetrics and Gynecology

## 2018-06-20 ENCOUNTER — Ambulatory Visit (INDEPENDENT_AMBULATORY_CARE_PROVIDER_SITE_OTHER): Payer: Medicare (Managed Care) | Admitting: Obstetrics and Gynecology

## 2018-06-20 VITALS — BP 122/57 | HR 75 | Wt 215.0 lb

## 2018-06-20 DIAGNOSIS — G40909 Epilepsy, unspecified, not intractable, without status epilepticus: Secondary | ICD-10-CM

## 2018-06-20 DIAGNOSIS — O99353 Diseases of the nervous system complicating pregnancy, third trimester: Secondary | ICD-10-CM

## 2018-06-20 DIAGNOSIS — O09513 Supervision of elderly primigravida, third trimester: Secondary | ICD-10-CM

## 2018-06-20 DIAGNOSIS — F251 Schizoaffective disorder, depressive type: Secondary | ICD-10-CM

## 2018-06-20 DIAGNOSIS — O133 Gestational [pregnancy-induced] hypertension without significant proteinuria, third trimester: Secondary | ICD-10-CM

## 2018-06-20 DIAGNOSIS — O0990 Supervision of high risk pregnancy, unspecified, unspecified trimester: Secondary | ICD-10-CM

## 2018-06-20 NOTE — Patient Instructions (Signed)

## 2018-06-20 NOTE — Progress Notes (Signed)
Subjective:  Amanda [name] is a 37 y.o. G2P0010 at [redacted]w[redacted]d being seen today for ongoing prenatal care.  She is currently monitored for the following issues for this high-risk pregnancy and has Schizoaffective disorder, depressive type ([place]); Seizure disorder ([place]); Supervision of high risk pregnancy, antepartum; Seizure disorder during pregnancy ([place]); Advanced maternal age, primigravida; Bipolar 2 disorder ([place]); Borderline personality disorder ([place]); Major depression, chronic; PTSD (post-traumatic stress disorder); OCD (obsessive compulsive disorder); Asthma; Pap smear of cervix shows high risk HPV present; and Gestational hypertension on their problem list.  Patient reports mild HA, no epigastric pain or visual changes. .  Contractions: Not present. Vag. Bleeding: None.  Movement: Present. Denies leaking of fluid.   Pt was admitted from 10/29-10/30 with GHTN, received magnesium x 24 hrs and BMZ.  The following portions of the patient's history were reviewed and updated as appropriate: allergies, current medications, past family history, past medical history, past social history, past surgical history and problem list. Problem list updated.  Objective:   Vitals:   06/20/18 1105  BP: (!) 122/57  Pulse: 75  Weight: 215 lb (97.5 kg)    Fetal Status:     Movement: Present     General:  Alert, oriented and cooperative. Patient is in no acute distress.  Skin: Skin is warm and dry. No rash noted.   Cardiovascular: Normal heart rate noted  Respiratory: Normal respiratory effort, no problems with respiration noted  Abdomen: Soft, gravid, appropriate for gestational age. Pain/Pressure: Present     Pelvic:  Cervical exam deferred        Extremities: Normal range of motion.  Edema: Trace  Mental Status: Normal mood and affect. Normal behavior. Normal judgment and thought content.   Urinalysis:      Assessment and Plan:  Pregnancy: G2P0010 at [redacted]w[redacted]d  1. Supervision of high risk pregnancy,  antepartum Stable  2. Gestational hypertension, third trimester BP satble No S/Sx of PEC Start weekly antenatal testing Growth scan 82 % 06/17/18, f/u ordered  3. Seizure disorder during pregnancy in third trimester ([place]) Stable on Keppra  4. Primigravida of advanced maternal age in third trimester No NIPS  5. Schizoaffective disorder, depressive type ([place]) Stable  Preterm labor symptoms and general obstetric precautions including but not limited to vaginal bleeding, contractions, leaking of fluid and fetal movement were reviewed in detail with the patient. Please refer to After Visit Summary for other counseling recommendations.  Return in about 1 week (around 06/27/2018) for OB visit.   Chancy Milroy, MD

## 2018-06-21 LAB — OB RESULTS CONSOLE GBS: GBS: NEGATIVE

## 2018-06-23 ENCOUNTER — Telehealth: Payer: Self-pay | Admitting: Emergency Medicine

## 2018-06-23 NOTE — Telephone Encounter (Signed)
Lost to followup 

## 2018-06-27 ENCOUNTER — Inpatient Hospital Stay (HOSPITAL_COMMUNITY)
Admission: AD | Admit: 2018-06-27 | Discharge: 2018-06-27 | Disposition: A | Discharge status: Home / Self Care | Payer: Medicare (Managed Care) | Source: Ambulatory Visit | Attending: Obstetrics and Gynecology | Admitting: Obstetrics and Gynecology

## 2018-06-27 ENCOUNTER — Other Ambulatory Visit: Payer: Self-pay

## 2018-06-27 ENCOUNTER — Encounter (HOSPITAL_COMMUNITY): Payer: Self-pay

## 2018-06-27 ENCOUNTER — Encounter: Payer: Self-pay | Admitting: Obstetrics & Gynecology

## 2018-06-27 DIAGNOSIS — Z3A33 33 weeks gestation of pregnancy: Secondary | ICD-10-CM | POA: Insufficient documentation

## 2018-06-27 DIAGNOSIS — F17210 Nicotine dependence, cigarettes, uncomplicated: Secondary | ICD-10-CM | POA: Diagnosis not present

## 2018-06-27 DIAGNOSIS — O26893 Other specified pregnancy related conditions, third trimester: Secondary | ICD-10-CM | POA: Diagnosis not present

## 2018-06-27 DIAGNOSIS — O09523 Supervision of elderly multigravida, third trimester: Secondary | ICD-10-CM

## 2018-06-27 DIAGNOSIS — Z3A30 30 weeks gestation of pregnancy: Secondary | ICD-10-CM

## 2018-06-27 DIAGNOSIS — R51 Headache: Secondary | ICD-10-CM | POA: Diagnosis not present

## 2018-06-27 DIAGNOSIS — O368130 Decreased fetal movements, third trimester, not applicable or unspecified: Secondary | ICD-10-CM

## 2018-06-27 DIAGNOSIS — O99333 Smoking (tobacco) complicating pregnancy, third trimester: Secondary | ICD-10-CM | POA: Insufficient documentation

## 2018-06-27 DIAGNOSIS — R519 Headache, unspecified: Secondary | ICD-10-CM

## 2018-06-27 LAB — URINALYSIS, ROUTINE W REFLEX MICROSCOPIC
Bilirubin Urine: NEGATIVE
GLUCOSE, UA: NEGATIVE mg/dL
HGB URINE DIPSTICK: NEGATIVE
Ketones, ur: NEGATIVE mg/dL
Leukocytes, UA: NEGATIVE
Nitrite: NEGATIVE
PROTEIN: NEGATIVE mg/dL
Specific Gravity, Urine: 1.013 (ref 1.005–1.030)
pH: 7 (ref 5.0–8.0)

## 2018-06-27 MED ORDER — DEXAMETHASONE SODIUM PHOSPHATE 10 MG/ML IJ SOLN
10.0000 mg | Freq: Once | INTRAMUSCULAR | Status: AC
  Administered 2018-06-27: 10 mg via INTRAVENOUS
  Filled 2018-06-27: qty 1

## 2018-06-27 MED ORDER — PROCHLORPERAZINE EDISYLATE 10 MG/2ML IJ SOLN
10.0000 mg | Freq: Once | INTRAMUSCULAR | Status: AC
  Administered 2018-06-27: 10 mg via INTRAVENOUS
  Filled 2018-06-27: qty 2

## 2018-06-27 MED ORDER — DIPHENHYDRAMINE HCL 25 MG PO CAPS: 25.0000 mg | ORAL_CAPSULE | Freq: Once | ORAL | Status: DC

## 2018-06-27 MED ORDER — LACTATED RINGERS IV BOLUS
1000.0000 mL | Freq: Once | INTRAVENOUS | Status: AC
  Administered 2018-06-27: 1000 mL via INTRAVENOUS

## 2018-06-27 NOTE — MAU Note (Signed)
Pt arrived via ems, pt reports decreased fetal movement since this morning, and a headache that she rates 9/10.  Pt also reports vomiting X6 in the last 24hrs.  Pt denies LOF or vaginal bleeding.

## 2018-06-27 NOTE — MAU Provider Note (Addendum)
History     CSN: [id number]  Arrival date and time: 06/27/18 1043   First Provider Initiated Contact with Patient 06/27/18 1123      Chief Complaint  Patient presents with  . Decreased Fetal Movement  . Headache   HPI   Amanda [name] is 37 y.o. G2P0010 female at [redacted]w[redacted]d presenting for decreased FM and headache. Has a history of severe Pre-E admitted for observation on 10/29. Also endorses h/o headaches for which she takes Fioricet. She took it about 2 hours prior to presentation without relief.   OB History    Gravida  2   Para  0   Term  0   Preterm  0   AB  1   Living  0     SAB  1   TAB  0   Ectopic  0   Multiple      Live Births              Past Medical History:  Diagnosis Date  . Anxiety   . Asthma   . Asthma due to environmental allergies   . Asthma due to seasonal allergies   . Bipolar 1 disorder ([place])   . Borderline personality disorder ([place])   . Brain bleed ([place])   . Chronic post-traumatic stress disorder (PTSD)    complex chronic with psychotic features and self harm behaviors  . Dander (animal) allergy   . Hearing loss    right ear  . Heart murmur   . Hip dysplasia   . Major depression, chronic   . Mood disorder ([place])   . OCD (obsessive compulsive disorder)   . RA (rheumatoid arthritis) ([place])   . Rheumatoid arthritis ([place])   . Seizure disorder ([place])   . Suicidal ideations   . Suicide attempt [facility])     Past Surgical History:  Procedure Laterality Date  . EYE SURGERY  1985  . SHUNT REMOVAL  1983  . Tooth Removal     multiple    Family History  Problem Relation Age of Onset  . Heart attack Mother   . Stroke Mother   . Liver cancer Mother   . Diabetes Mother   . Lung cancer Mother   . Alcoholism Father   . Sleep apnea Brother   . Depression Brother   . ADD / ADHD Brother   . Diabetes Maternal Aunt   . Diabetes Paternal Uncle   . Diabetes Maternal Grandmother     Social History   Tobacco Use  . Smoking status:  Current Every Day Smoker    Packs/day: 0.25    Types: Cigarettes  . Smokeless tobacco: Never Used  Substance Use Topics  . Alcohol use: Not Currently    Frequency: Never  . Drug use: Not Currently    Types: Marijuana    Comment:  last used in Jan 2019 due to "RA" per pt    Allergies:  Allergies  Allergen Reactions  . Aspartame And Phenylalanine Anaphylaxis, Hives and Diarrhea  . Benadryl [Diphenhydramine] Anaphylaxis, Diarrhea and Other (See Comments)    Blisters also  . Other Anaphylaxis, Nausea And Vomiting, Rash and Other (See Comments)    Aspartame- Blisters, also Dust- Worsens asthma Ragweed- Worsens asthma, face gets red, and sneezing Animal Fur/Dander- Worsens asthma and sneezing    . Scallops [Shellfish Allergy] Anaphylaxis, Diarrhea and Nausea And Vomiting  . Yellow Jacket Venom [Bee Venom] Anaphylaxis, Diarrhea and Nausea And Vomiting    Seizures and numbness  .  Pollen Extract Other (See Comments)    Runny nose, eyes, and asthma worsens  . Tegretol [Carbamazepine] Hives, Diarrhea and Other (See Comments)    Blisters in mouth and increase in seizures  . Adhesive [Tape] Rash  . Latex Hives and Rash    Blisters, also- Condoms and dental encounters    Medications Prior to Admission  Medication Sig Dispense Refill Last Dose  . butalbital-acetaminophen-caffeine (FIORICET, ESGIC) 50-325-40 MG tablet Take 2 tablets by mouth every 6 (six) hours as needed for headache. 14 tablet 0 06/27/2018 at Unknown time  . levETIRAcetam (KEPPRA) 500 MG tablet Take 1 tablet (500 mg total) by mouth 2 (two) times daily. 30 tablet 0 06/27/2018 at 0900  . Prenatal Multivit-Min-Fe-FA (PRENATAL VITAMINS) 0.8 MG tablet Take 1 tablet by mouth daily. 30 tablet 12 06/27/2018 at Unknown time  . EPINEPHrine 0.3 mg/0.3 mL IJ SOAJ injection Inject 0.3 mg into the muscle as needed (for allergic reaction).    Taking    Review of Systems  Constitutional: Negative for chills and fever.  HENT: Negative  for congestion.   Respiratory: Negative for shortness of breath.   Cardiovascular: Negative for chest pain.  Gastrointestinal: Negative for abdominal pain and vomiting.  Genitourinary: Negative for dysuria, vaginal bleeding, vaginal discharge and vaginal pain.  Neurological: Positive for headaches. Negative for syncope and light-headedness.  Psychiatric/Behavioral: Negative for confusion.   Physical Exam   Blood pressure 130/88, pulse 79, temperature 97.9 F (36.6 C), temperature source Oral, resp. rate 18, last menstrual period 11/07/2017, SpO2 97 %.  Physical Exam  Constitutional: She appears well-developed and well-nourished. No distress.  HENT:  Head: Normocephalic and atraumatic.  Eyes: Conjunctivae and EOM are normal.  Neck: Normal range of motion. Neck supple.  Cardiovascular: Normal rate, regular rhythm and normal heart sounds.  Respiratory: Effort normal and breath sounds normal. No respiratory distress.  GI: Soft. Bowel sounds are normal. She exhibits no distension.   Fetal Tracing:  Baseline: 130 Variability: moderate Accels: 15x15 Decels: none  Toco: ui with occasional uc's  Patient Vitals for the past 24 hrs:  BP Temp Temp src Pulse Resp SpO2  06/27/18 1215 130/88 - - 79 - 97 %  06/27/18 1130 130/82 - - 76 - 97 %  06/27/18 1100 127/74 - - 85 - 97 %  06/27/18 1047 112/78 97.9 F (36.6 C) Oral 77 18 -    MAU Course  Procedures Results for orders placed or performed during the hospital encounter of 06/27/18 (from the past 24 hour(s))  Urinalysis, Routine w reflex microscopic     Status: Abnormal   Collection Time: 06/27/18 11:26 AM  Result Value Ref Range   Color, Urine YELLOW YELLOW   APPearance HAZY (A) CLEAR   Specific Gravity, Urine 1.013 1.005 - 1.030   pH 7.0 5.0 - 8.0   Glucose, UA NEGATIVE NEGATIVE mg/dL   Hgb urine dipstick NEGATIVE NEGATIVE   Bilirubin Urine NEGATIVE NEGATIVE   Ketones, ur NEGATIVE NEGATIVE mg/dL   Protein, ur NEGATIVE  NEGATIVE mg/dL   Nitrite NEGATIVE NEGATIVE   Leukocytes, UA NEGATIVE NEGATIVE   MDM Patient with reactive NST. Will give headache cocktail with LR bolus, compazine and decadron. Patient has anaphylaxis to Benadryl. Although patient has a history of Pre-E, BP is currently stable on repeat measurements.   1:39 PM  Care transferred to [employer], CNM.   Patient reports complete resolution of headache after IV medications. Denies any pain at this time.  Assessment and Plan  1. Decreased fetal movements in third trimester, single or unspecified fetus   2. Pregnancy headache in third trimester    -Discharge home in stable condition -Preeclampsia precautions discussed -Patient advised to follow-up with [facility] as scheduled for prenatal care -Patient may return to MAU as needed or if her condition were to change or worsen  Wende Mott CNM 06/27/2018, 1:39 PM

## 2018-06-27 NOTE — Discharge Instructions (Signed)

## 2018-07-02 ENCOUNTER — Inpatient Hospital Stay (HOSPITAL_COMMUNITY)
Admission: AD | Admit: 2018-07-02 | Discharge: 2018-07-03 | Disposition: A | Discharge status: Home / Self Care | Payer: Medicare (Managed Care) | Source: Ambulatory Visit | Attending: Obstetrics & Gynecology | Admitting: Obstetrics & Gynecology

## 2018-07-02 ENCOUNTER — Encounter (HOSPITAL_COMMUNITY): Payer: Self-pay

## 2018-07-02 DIAGNOSIS — R109 Unspecified abdominal pain: Secondary | ICD-10-CM | POA: Diagnosis present

## 2018-07-02 DIAGNOSIS — O99333 Smoking (tobacco) complicating pregnancy, third trimester: Secondary | ICD-10-CM | POA: Diagnosis not present

## 2018-07-02 DIAGNOSIS — O139 Gestational [pregnancy-induced] hypertension without significant proteinuria, unspecified trimester: Secondary | ICD-10-CM | POA: Diagnosis present

## 2018-07-02 DIAGNOSIS — G40909 Epilepsy, unspecified, not intractable, without status epilepticus: Secondary | ICD-10-CM

## 2018-07-02 DIAGNOSIS — Z3A34 34 weeks gestation of pregnancy: Secondary | ICD-10-CM

## 2018-07-02 DIAGNOSIS — O479 False labor, unspecified: Secondary | ICD-10-CM

## 2018-07-02 DIAGNOSIS — O09513 Supervision of elderly primigravida, third trimester: Secondary | ICD-10-CM | POA: Diagnosis not present

## 2018-07-02 DIAGNOSIS — O133 Gestational [pregnancy-induced] hypertension without significant proteinuria, third trimester: Secondary | ICD-10-CM | POA: Insufficient documentation

## 2018-07-02 DIAGNOSIS — O0990 Supervision of high risk pregnancy, unspecified, unspecified trimester: Secondary | ICD-10-CM

## 2018-07-02 DIAGNOSIS — Z3A33 33 weeks gestation of pregnancy: Secondary | ICD-10-CM | POA: Diagnosis not present

## 2018-07-02 DIAGNOSIS — O99353 Diseases of the nervous system complicating pregnancy, third trimester: Secondary | ICD-10-CM | POA: Diagnosis not present

## 2018-07-02 DIAGNOSIS — F17210 Nicotine dependence, cigarettes, uncomplicated: Secondary | ICD-10-CM | POA: Diagnosis not present

## 2018-07-02 LAB — CBC
HCT: 39.6 % (ref 36.0–46.0)
Hemoglobin: 13.6 g/dL (ref 12.0–15.0)
MCH: 31.1 pg (ref 26.0–34.0)
MCHC: 34.3 g/dL (ref 30.0–36.0)
MCV: 90.4 fL (ref 80.0–100.0)
NRBC: 0 % (ref 0.0–0.2)
PLATELETS: 379 10*3/uL (ref 150–400)
RBC: 4.38 MIL/uL (ref 3.87–5.11)
RDW: 13.3 % (ref 11.5–15.5)
WBC: 22.6 10*3/uL — ABNORMAL HIGH (ref 4.0–10.5)

## 2018-07-02 LAB — URINALYSIS, ROUTINE W REFLEX MICROSCOPIC
BILIRUBIN URINE: NEGATIVE
Glucose, UA: NEGATIVE mg/dL
HGB URINE DIPSTICK: NEGATIVE
KETONES UR: 5 mg/dL — AB
Leukocytes, UA: NEGATIVE
NITRITE: NEGATIVE
Protein, ur: NEGATIVE mg/dL
Specific Gravity, Urine: 1.013 (ref 1.005–1.030)
pH: 7 (ref 5.0–8.0)

## 2018-07-02 LAB — COMPREHENSIVE METABOLIC PANEL
ALT: 9 U/L (ref 0–44)
ANION GAP: 10 (ref 5–15)
AST: 13 U/L — AB (ref 15–41)
Albumin: 3 g/dL — ABNORMAL LOW (ref 3.5–5.0)
Alkaline Phosphatase: 132 U/L — ABNORMAL HIGH (ref 38–126)
BILIRUBIN TOTAL: 0.4 mg/dL (ref 0.3–1.2)
CO2: 20 mmol/L — ABNORMAL LOW (ref 22–32)
Calcium: 9.1 mg/dL (ref 8.9–10.3)
Chloride: 108 mmol/L (ref 98–111)
Creatinine, Ser: 0.44 mg/dL (ref 0.44–1.00)
GFR calc non Af Amer: >60 mL/min (ref >60)
Glucose, Bld: 81 mg/dL (ref 70–99)
POTASSIUM: 3.6 mmol/L (ref 3.5–5.1)
Sodium: 138 mmol/L (ref 135–145)
Total Protein: 6.8 g/dL (ref 6.5–8.1)

## 2018-07-02 LAB — PROTEIN / CREATININE RATIO, URINE
CREATININE, URINE: 171 mg/dL
PROTEIN CREATININE RATIO: 0.19 mg/mg{creat} — AB (ref 0.00–0.15)
TOTAL PROTEIN, URINE: 32 mg/dL

## 2018-07-02 MED ORDER — LABETALOL HCL 5 MG/ML IV SOLN
20.0000 mg | INTRAVENOUS | Status: DC | PRN
  Administered 2018-07-02: 20 mg via INTRAVENOUS

## 2018-07-02 MED ORDER — LABETALOL HCL 5 MG/ML IV SOLN
INTRAVENOUS | Status: AC
  Administered 2018-07-02: 20 mg via INTRAVENOUS
  Filled 2018-07-02: qty 4

## 2018-07-02 MED ORDER — LABETALOL HCL 200 MG PO TABS: 200.0000 mg | ORAL_TABLET | Freq: Three times a day (TID) | ORAL | 3 refills | Status: DC

## 2018-07-02 MED ORDER — HYDRALAZINE HCL 20 MG/ML IJ SOLN: 10.0000 mg | INTRAMUSCULAR | Status: DC | PRN

## 2018-07-02 MED ORDER — LABETALOL HCL 5 MG/ML IV SOLN: 80.0000 mg | INTRAVENOUS | Status: DC | PRN

## 2018-07-02 MED ORDER — LABETALOL HCL 5 MG/ML IV SOLN: 40.0000 mg | INTRAVENOUS | Status: DC | PRN

## 2018-07-02 MED ORDER — LACTATED RINGERS IV BOLUS
1000.0000 mL | Freq: Once | INTRAVENOUS | Status: AC
  Administered 2018-07-02: 1000 mL via INTRAVENOUS

## 2018-07-02 MED ORDER — TERBUTALINE SULFATE 1 MG/ML IJ SOLN
0.2500 mg | Freq: Once | INTRAMUSCULAR | Status: AC
  Administered 2018-07-02: 0.25 mg via SUBCUTANEOUS
  Filled 2018-07-02: qty 1

## 2018-07-02 MED ORDER — LACTATED RINGERS IV BOLUS: 1000.0000 mL | Freq: Once | INTRAVENOUS | Status: DC

## 2018-07-02 NOTE — MAU Provider Note (Addendum)
Chief Complaint:  Hypertension and Abdominal Cramping   First Provider Initiated Contact with Patient 07/02/18 2134      HPI: Amanda [name] is a 37 y.o. G2P0010 at [redacted]w[redacted]d who presents to maternity admissions via EMS for cramping/contractions and HTN.  She reports her contractions started at 6:30 pm and have continued, becoming more painful over time. She is feeling fetal movement, but not as much as usual. She does report n/v earlier today but that has improved.  She reports occasional headaches, but none now in MAU. There is no epigastric pain and there are no visual disturbances.There are no other symptoms.   She has not tried any treatments.    HPI  Past Medical History: Past Medical History:  Diagnosis Date  . Anxiety   . Asthma   . Asthma due to environmental allergies   . Asthma due to seasonal allergies   . Bipolar 1 disorder ([place])   . Borderline personality disorder ([place])   . Brain bleed ([place])   . Chronic post-traumatic stress disorder (PTSD)    complex chronic with psychotic features and self harm behaviors  . Dander (animal) allergy   . Hearing loss    right ear  . Heart murmur   . Hip dysplasia   . Major depression, chronic   . Mood disorder ([place])   . OCD (obsessive compulsive disorder)   . RA (rheumatoid arthritis) ([place])   . Rheumatoid arthritis ([place])   . Seizure disorder ([place])   . Suicidal ideations   . Suicide attempt [facility])     Past obstetric history: OB History  Gravida Para Term Preterm AB Living  2 0 0 0 1 0  SAB TAB Ectopic Multiple Live Births  1 0 0        # Outcome Date GA Lbr Len/2nd Weight Sex Delivery Anes PTL Lv  2 Current           1 SAB  [redacted]w[redacted]d   M        Past Surgical History: Past Surgical History:  Procedure Laterality Date  . EYE SURGERY  1985  . SHUNT REMOVAL  1983  . Tooth Removal     multiple    Family History: Family History  Problem Relation Age of Onset  . Heart attack Mother   . Stroke Mother   . Liver cancer Mother   .  Diabetes Mother   . Lung cancer Mother   . Alcoholism Father   . Sleep apnea Brother   . Depression Brother   . ADD / ADHD Brother   . Diabetes Maternal Aunt   . Diabetes Paternal Uncle   . Diabetes Maternal Grandmother     Social History: Social History   Tobacco Use  . Smoking status: Current Every Day Smoker    Packs/day: 0.25    Types: Cigarettes  . Smokeless tobacco: Never Used  Substance Use Topics  . Alcohol use: Not Currently    Frequency: Never  . Drug use: Not Currently    Types: Marijuana    Comment:  last used in Jan 2019 due to "RA" per pt    Allergies:  Allergies  Allergen Reactions  . Aspartame And Phenylalanine Anaphylaxis, Hives and Diarrhea  . Benadryl [Diphenhydramine] Anaphylaxis, Diarrhea and Other (See Comments)    Blisters also  . Other Anaphylaxis, Nausea And Vomiting, Rash and Other (See Comments)    Aspartame- Blisters, also Dust- Worsens asthma Ragweed- Worsens asthma, face gets red, and sneezing Animal Fur/Dander- Worsens asthma  and sneezing    . Scallops [Shellfish Allergy] Anaphylaxis, Diarrhea and Nausea And Vomiting  . Yellow Jacket Venom [Bee Venom] Anaphylaxis, Diarrhea and Nausea And Vomiting    Seizures and numbness  . Pollen Extract Other (See Comments)    Runny nose, eyes, and asthma worsens  . Tegretol [Carbamazepine] Hives, Diarrhea and Other (See Comments)    Blisters in mouth and increase in seizures  . Adhesive [Tape] Rash  . Latex Hives and Rash    Blisters, also- Condoms and dental encounters    Meds:  Medications Prior to Admission  Medication Sig Dispense Refill Last Dose  . butalbital-acetaminophen-caffeine (FIORICET, ESGIC) 50-325-40 MG tablet Take 2 tablets by mouth every 6 (six) hours as needed for headache. 14 tablet 0 Past Month at Unknown time  . levETIRAcetam (KEPPRA) 500 MG tablet Take 1 tablet (500 mg total) by mouth 2 (two) times daily. 30 tablet 0 07/02/2018 at Unknown time  . Prenatal  Multivit-Min-Fe-FA (PRENATAL VITAMINS) 0.8 MG tablet Take 1 tablet by mouth daily. 30 tablet 12 07/02/2018 at Unknown time  . EPINEPHrine 0.3 mg/0.3 mL IJ SOAJ injection Inject 0.3 mg into the muscle as needed (for allergic reaction).    Taking    ROS:  Review of Systems  Constitutional: Negative for chills, fatigue and fever.  Respiratory: Negative for shortness of breath.   Cardiovascular: Negative for chest pain.  Gastrointestinal: Positive for abdominal pain, nausea and vomiting. Negative for constipation.  Genitourinary: Positive for pelvic pain. Negative for difficulty urinating, dysuria, flank pain, vaginal bleeding, vaginal discharge and vaginal pain.  Musculoskeletal: Negative for back pain.  Neurological: Negative for dizziness and headaches.  Psychiatric/Behavioral: Negative.      I have reviewed patient's Past Medical Hx, Surgical Hx, Family Hx, Social Hx, medications and allergies.   Physical Exam   Patient Vitals for the past 24 hrs:  BP Temp Temp src Pulse Resp SpO2  07/02/18 2301 136/84 - - 76 - -  07/02/18 2246 134/82 - - 80 - -  07/02/18 2231 127/81 - - 79 - -  07/02/18 2216 118/67 - - 77 - -  07/02/18 2201 130/77 - - 74 - -  07/02/18 2146 126/84 - - 79 - -  07/02/18 2131 (!) 157/97 - - 93 - -  07/02/18 2117 (!) 167/90 - - 79 - -  07/02/18 2106 - - - - - 97 %  07/02/18 2101 - - - - - 97 %  07/02/18 2056 - - - - - 97 %  07/02/18 2054 (!) 165/90 98.2 F (36.8 C) Oral 83 18 -   Constitutional: Well-developed, well-nourished female in no acute distress.  Cardiovascular: normal rate Respiratory: normal effort GI: Abd soft, non-tender, gravid appropriate for gestational age.  MS: Extremities nontender, no edema, normal ROM Neurologic: Alert and oriented x 4.  GU: Neg CVAT.   Dilation: Closed Effacement (%): Thick Cervical Position: Posterior Presentation: Vertex Exam by:: L. [place] CNM  FHT:  Baseline 135 , moderate variability, accelerations  present, no decelerations Contractions: Q 2-3 minutes, mild to palpation   Labs: Results for orders placed or performed during the hospital encounter of 07/02/18 (from the past 24 hour(s))  CBC     Status: Abnormal   Collection Time: 07/02/18  9:19 PM  Result Value Ref Range   WBC 22.6 (H) 4.0 - 10.5 K/uL   RBC 4.38 3.87 - 5.11 MIL/uL   Hemoglobin 13.6 12.0 - 15.0 g/dL   HCT 39.6 36.0 - 46.0 %  MCV 90.4 80.0 - 100.0 fL   MCH 31.1 26.0 - 34.0 pg   MCHC 34.3 30.0 - 36.0 g/dL   RDW 13.3 11.5 - 15.5 %   Platelets 379 150 - 400 K/uL   nRBC 0.0 0.0 - 0.2 %  Comprehensive metabolic panel     Status: Abnormal   Collection Time: 07/02/18  9:19 PM  Result Value Ref Range   Sodium 138 135 - 145 mmol/L   Potassium 3.6 3.5 - 5.1 mmol/L   Chloride 108 98 - 111 mmol/L   CO2 20 (L) 22 - 32 mmol/L   Glucose, Bld 81 70 - 99 mg/dL   BUN <5 (L) 6 - 20 mg/dL   Creatinine, Ser 0.44 0.44 - 1.00 mg/dL   Calcium 9.1 8.9 - 10.3 mg/dL   Total Protein 6.8 6.5 - 8.1 g/dL   Albumin 3.0 (L) 3.5 - 5.0 g/dL   AST 13 (L) 15 - 41 U/L   ALT 9 0 - 44 U/L   Alkaline Phosphatase 132 (H) 38 - 126 U/L   Total Bilirubin 0.4 0.3 - 1.2 mg/dL   GFR calc non Af Amer >60 >60 mL/min   GFR calc Af Amer >60 >60 mL/min   Anion gap 10 5 - 15  Urinalysis, Routine w reflex microscopic     Status: Abnormal   Collection Time: 07/02/18  9:25 PM  Result Value Ref Range   Color, Urine YELLOW YELLOW   APPearance HAZY (A) CLEAR   Specific Gravity, Urine 1.013 1.005 - 1.030   pH 7.0 5.0 - 8.0   Glucose, UA NEGATIVE NEGATIVE mg/dL   Hgb urine dipstick NEGATIVE NEGATIVE   Bilirubin Urine NEGATIVE NEGATIVE   Ketones, ur 5 (A) NEGATIVE mg/dL   Protein, ur NEGATIVE NEGATIVE mg/dL   Nitrite NEGATIVE NEGATIVE   Leukocytes, UA NEGATIVE NEGATIVE  Protein / creatinine ratio, urine     Status: Abnormal   Collection Time: 07/02/18  9:25 PM  Result Value Ref Range   Creatinine, Urine 171.00 mg/dL   Total Protein, Urine 32 mg/dL    Protein Creatinine Ratio 0.19 (H) 0.00 - 0.15 mg/mg[Cre]   --/--/O POS, O POS Performed at [facility], [address], [place], [place] [address]  (10/30 2035)  Imaging:    MAU Course/MDM: Preeclampsia labs wnl, with P/C ratio of 0.19 NST reviewed and reactive Cervix closed/thick/high so no evidence of preterm labor on arrival Preeclampsia focused orders placed and labetalol 20 mg IV given. Pt normotensive after one dose. Consult Dr Elonda Husky with presentation, exam findings and test results.  Terbutaline 0.25 mg SQ LR x 1000 ml Start pt on Labetalol 200 mg TID if discharged Report to Gerri Lins, [employer] Certified Nurse-Midwife 07/02/2018 11:22 PM   I assumed care of this patient. Cervical exam rechecked and remained closed, unchanged from prior. Patient reports no longer feeling contractions. Stable for discharge home. Patient counseled on starting labetalol po TID, she voiced understanding of plan. Has follow-up in clinic later this week. Reviewed return precautions. Blood pressure normotensive prior to discharge, no longer having headache.   Lambert Mody. Juleen China, DO OB/GYN Fellow

## 2018-07-02 NOTE — MAU Note (Signed)
PT felt abdominal cramping around 1830. Wasn't sure if it was cntx.   Has had headache today, but didn't take anything. BP 165/90.  Has been diagnosed with PIH per pt. Is not on meds. MD aware.

## 2018-07-03 NOTE — MAU Note (Signed)
Pt and boyfriend asked for cab voucher because they have no one to pick them up. Only family is asleep because they have to get up for work at [employer].  PT arrived via EMS. Boyfriend took the bus.  They live in a hotel and do not drive.  House coverage told and said that no cab vouchers are supposed to be given and to tell them either they get ride or stay in lobby until bus comes. Told patient this and boyfriend said they got a cab voucher 2 weeks ago from [place]. He said that they wanted to talk to [facility] about why they couldn't get one this time.  I told HC who talked to patient and ended up giving one time voucher.

## 2018-07-03 NOTE — Progress Notes (Signed)
Late entry due to system being done. Bedside Rn stated that pt and spouse asked for cab voucher. Stated they live in hotel, pt was brought in via EMS. Spoke with couple and explained that it is not protocol to give our cab vouchers to patients. Dad had stated and confirmed that he had family members at home but they were asleep because they had to be at work at 0400. I verbally noted that it was 0210 and family could be awaken to pick them up and still be able to get to work on time. They refused to call family members as they had been d/c and had been sitting in the waiting area. Stated they had received voucher before, I again stated not the procedure of the hospital to provide transportation to patients when family members are available. Amanda [name] claimed she was hurting but refused to check in again for assessment but needed to get home. Address was given for [facility], cab voucher given and reiterated to patient and spouse reasons to obtain voucher. Amanda [name] was unable to do much talking d/t pain and difficulty trying to use mobile phone. [employer] utilized.

## 2018-07-04 ENCOUNTER — Ambulatory Visit (INDEPENDENT_AMBULATORY_CARE_PROVIDER_SITE_OTHER): Payer: Medicare (Managed Care) | Admitting: Obstetrics & Gynecology

## 2018-07-04 ENCOUNTER — Ambulatory Visit (INDEPENDENT_AMBULATORY_CARE_PROVIDER_SITE_OTHER): Payer: Medicare (Managed Care) | Admitting: Clinical

## 2018-07-04 ENCOUNTER — Ambulatory Visit: Payer: Self-pay

## 2018-07-04 ENCOUNTER — Ambulatory Visit (INDEPENDENT_AMBULATORY_CARE_PROVIDER_SITE_OTHER): Payer: Medicare (Managed Care) | Admitting: *Deleted

## 2018-07-04 VITALS — BP 105/77 | HR 113 | Wt 205.7 lb

## 2018-07-04 DIAGNOSIS — Z3A34 34 weeks gestation of pregnancy: Secondary | ICD-10-CM

## 2018-07-04 DIAGNOSIS — F3181 Bipolar II disorder: Secondary | ICD-10-CM

## 2018-07-04 DIAGNOSIS — G40909 Epilepsy, unspecified, not intractable, without status epilepticus: Secondary | ICD-10-CM

## 2018-07-04 DIAGNOSIS — O09519 Supervision of elderly primigravida, unspecified trimester: Secondary | ICD-10-CM

## 2018-07-04 DIAGNOSIS — O133 Gestational [pregnancy-induced] hypertension without significant proteinuria, third trimester: Secondary | ICD-10-CM

## 2018-07-04 DIAGNOSIS — O99353 Diseases of the nervous system complicating pregnancy, third trimester: Secondary | ICD-10-CM

## 2018-07-04 DIAGNOSIS — F259 Schizoaffective disorder, unspecified: Secondary | ICD-10-CM | POA: Diagnosis not present

## 2018-07-04 DIAGNOSIS — O09513 Supervision of elderly primigravida, third trimester: Secondary | ICD-10-CM | POA: Diagnosis not present

## 2018-07-04 DIAGNOSIS — F251 Schizoaffective disorder, depressive type: Secondary | ICD-10-CM

## 2018-07-04 DIAGNOSIS — O0993 Supervision of high risk pregnancy, unspecified, third trimester: Secondary | ICD-10-CM

## 2018-07-04 DIAGNOSIS — O0990 Supervision of high risk pregnancy, unspecified, unspecified trimester: Secondary | ICD-10-CM

## 2018-07-04 NOTE — Progress Notes (Signed)
Pt informed that the ultrasound is considered a limited OB ultrasound and is not intended to be a complete ultrasound exam.  Patient also informed that the ultrasound is not being completed with the intent of assessing for fetal or placental anomalies or any pelvic abnormalities.  Explained that the purpose of today's ultrasound is to assess for presentation, BPP and amniotic fluid volume.  Patient acknowledges the purpose of the exam and the limitations of the study.    [place] for growth scheduled on 11/21, BPP added

## 2018-07-04 NOTE — Progress Notes (Signed)
PRENATAL VISIT NOTE  Subjective:  Amanda [name] is a 37 y.o. G2P0010 at [redacted]w[redacted]d being seen today for ongoing prenatal care.  She is currently monitored for the following issues for this high-risk pregnancy and has Schizoaffective disorder, depressive type ([place]); Seizure disorder ([place]); Supervision of high risk pregnancy, antepartum; Seizure disorder during pregnancy ([place]); Advanced maternal age, primigravida; Bipolar 2 disorder ([place]); Borderline personality disorder ([place]); Major depression, chronic; PTSD (post-traumatic stress disorder); OCD (obsessive compulsive disorder); Asthma; Pap smear of cervix shows high risk HPV present; and Gestational hypertension on their problem list.  Patient reports having some bleeding from site of removed mole in back. No current symptoms. Keeps picking at it.  Contractions: Not present. Vag. Bleeding: None.  Movement: Present. Denies leaking of fluid.   The following portions of the patient's history were reviewed and updated as appropriate: allergies, current medications, past family history, past medical history, past social history, past surgical history and problem list. Problem list updated.  Objective:  BP 105/77  P 113  Wt 205 lb FHR  NST Fetal Status:     Movement: Present     General:  Alert, oriented and cooperative. Patient is in no acute distress.  Skin: Skin is warm and dry. No rash noted. Healing excised mole on abck, no erythema.   Cardiovascular: Normal heart rate noted  Respiratory: Normal respiratory effort, no problems with respiration noted  Abdomen: Soft, gravid, appropriate for gestational age.  Pain/Pressure: Absent     Pelvic: Cervical exam deferred        Extremities: Normal range of motion.  Edema: None  Mental Status: Normal mood and affect. Normal behavior. Normal judgment and thought content.   [place] Mfm Ob Follow Up  Result Date: 06/12/2018 ----------------------------------------------------------------------  OBSTETRICS REPORT                        (Signed Final 06/12/2018 05:23 pm) ---------------------------------------------------------------------- Patient Info  ID #:       [id number]                          D.O.B.:  07/10/1981 (37 yrs)  Name:       Amanda [name]                     Visit Date: 06/12/2018 02:04 pm ---------------------------------------------------------------------- Performed By  Performed By:     Novella Rob        Ref. Address:     Faculty                    RDMS  Attending:        Tama High MD        Location:         [facility]  Referred By:      Mora Bellman MD ---------------------------------------------------------------------- Orders   #  Description                          Code         Ordered By   1  [place] MFM OB FOLLOW UP                  76720.94     Tama High  ----------------------------------------------------------------------   #  Order #  Accession #                 Episode #   1  [id number]                  [id number]                  [id number]  ---------------------------------------------------------------------- Indications   Bipolar disease during pregnancy               O99.340 F31.9   Other mental disorder complicating             O99.340   pregnancy, second trimester -   schizoaffective disorder.   Seizure disorder (Keppra)                      O99.350 G30.909   Advanced maternal age multigravida 79+,        O69.522   second trimester (low risk NIPS, Normal   AFP)   Encounter for other antenatal screening        Z36.2   follow-up   Obesity complicating pregnancy, third          O99.213   trimester (BMI 31.9)   [redacted] weeks gestation of pregnancy                Z3A.31  ---------------------------------------------------------------------- Vital Signs  Weight (lb): 212                               Height:        5'8"  BMI:         32.23 ---------------------------------------------------------------------- Fetal Evaluation  Num Of Fetuses:         1   Fetal Heart Rate(bpm):  155  Cardiac Activity:       Observed  Presentation:           Breech  Placenta:               Anterior  P. Cord Insertion:      Previously Visualized  Amniotic Fluid  AFI FV:      Within normal limits  AFI Sum(cm)     %Tile       Largest Pocket(cm)  22.22           87          7.71  RUQ(cm)       RLQ(cm)       LUQ(cm)        LLQ(cm)  5.45          5.49          7.71           3.57 ---------------------------------------------------------------------- Biometry  BPD:        86  mm     G. Age:  34w 5d       > 99  %    CI:        76.77   %    70 - 86                                                          FL/HC:      19.4   %    19.3 - 21.3  HC:      310.9  mm     G. Age:  34w 5d         96  %    HC/AC:      1.07        0.96 - 1.17  AC:      289.4  mm     G. Age:  33w 0d         92  %    FL/BPD:     70.2   %    71 - 87  FL:       60.4  mm     G. Age:  31w 3d         48  %    FL/AC:      20.9   %    20 - 24  LV:        6.6  mm  Est. FW:    2059  gm      4 lb 9 oz     82  % ---------------------------------------------------------------------- OB History  Gravidity:    2         Term:   0        Prem:   0        SAB:   1  TOP:          0       Ectopic:  0        Living: 0 ---------------------------------------------------------------------- Gestational Age  LMP:           31w 0d        Date:  11/07/17                 EDD:   08/14/18  U/S Today:     33w 3d                                        EDD:   07/28/18  Best:          31w 0d     Det. By:  LMP  (11/07/17)          EDD:   08/14/18 ---------------------------------------------------------------------- Anatomy  Cranium:               Appears normal         Aortic Arch:            Previously seen  Cavum:                 Appears normal         Ductal Arch:            Previously seen  Ventricles:            Appears normal         Diaphragm:              Appears normal  Choroid Plexus:        Previously seen        Stomach:                 Appears normal, left  sided  Cerebellum:            Previously seen        Abdomen:                Appears normal  Posterior Fossa:       Previously seen        Abdominal Wall:         Previously seen  Nuchal Fold:           Previously seen        Cord Vessels:           Previously seen  Face:                  Profile nl; orbits     Kidneys:                Appear normal                         previously seen  Lips:                  Previously seen        Bladder:                Appears normal  Thoracic:              Appears normal         Spine:                  Previously seen  Heart:                 Previously seen        Upper Extremities:      Previously seen  RVOT:                  Previously seen        Lower Extremities:      Previously seen  LVOT:                  Previously seen  Other:  Fetus appears to be a female. Heels visualized. ---------------------------------------------------------------------- Cervix Uterus Adnexa  Cervix  Not visualized (advanced GA >24wks) ---------------------------------------------------------------------- Impression  Seizure disorder and the patient takes keppra. She reports no  seizures since her last visit with [place]. She has not made an  appointment with her neurologist yet.  Fetal growth is appropriate for gestational age. Amniotic fluid  is normal and good fetal activity is seen. ---------------------------------------------------------------------- Recommendations  An appointment was made for her to return in 4 weeks for  fetal growth assessment. ----------------------------------------------------------------------                  Tama High, MD Electronically Signed Final Report   06/12/2018 05:23 pm ----------------------------------------------------------------------   Assessment and Plan:  Pregnancy: G2P0010 at [redacted]w[redacted]d  1. Gestational hypertension, third trimester Stable BP today, no PEC  symptoms.  NST performed today was reviewed and was found to be reactive. Subsequent BPP performed today was also reviewed and was found to be 8/10 (no breathing). AFI was also normal. Continue recommended antenatal testing and prenatal care. IOL at 37 weeks or earlier for severe features. - [place] MFM FETAL BPP WO NON STRESS; Future (added to 07/10/18 scan)  2. Seizure disorder during pregnancy in third trimester ([place]) Continue Keppra  3. Supervision of high risk pregnancy, antepartum Preterm labor symptoms and general obstetric precautions including but not limited  to vaginal bleeding, contractions, leaking of fluid and fetal movement were reviewed in detail with the patient. Please refer to After Visit Summary for other counseling recommendations.  Return in about 1 week (around 07/11/2018) for as scheduled.  Future Appointments  Date Time Provider [place]  07/10/2018  3:30 PM [place] 1 WH-MFCUS MFC-US  07/11/2018 10:15 AM Aletha Halim, MD [facility] WOC  07/11/2018 11:15 AM WOC-WOCA NST WOC-WOCA WOC  07/16/2018 10:15 AM Woodroe Mode, MD [facility] WOC  07/16/2018 11:15 AM WOC-WOCA NST WOC-WOCA WOC  07/21/2018  9:15 AM WOC-WOCA NST WOC-WOCA WOC  07/21/2018 10:15 AM Woodroe Mode, MD [place]    Verita Schneiders, MD

## 2018-07-04 NOTE — BH Specialist Note (Signed)
Integrated Behavioral Health Follow Up Visit  MRN: [id number] Name: Amanda [name]  Number of [place] Clinician visits: 5/6 Session Start time: 11:40  Session End time: 12:50 Total time: 1 hour  Type of Service: [place] Interpretor:No. Interpretor Name and Language: n/a  SUBJECTIVE: Amanda [name] is a 37 y.o. female accompanied by Partner/Significant Other Patient was referred by Emeterio Reeve, MD for untreated [facility] issues. Patient reports the following symptoms/concerns: Pt states she stopped attending therapy with [facility] [place], and agrees to go back to her routine therapist before baby is born. Pt's primary concern is worry over possible CPS involvement because she is living in a motel.  Duration of problem: current pregnancy; Severity of problem: severe  OBJECTIVE: Mood: Anxious and Affect: Labile Risk of harm to self or others: No plan to harm self or others  LIFE CONTEXT: Family and Social: Pt lives with FOB; FOB's family is supportive School/Work: - Self-Care: - Life Changes: Current pregnancy; housing instability  GOALS ADDRESSED: Patient will: 1.  Reduce symptoms of: stress    INTERVENTIONS: Interventions utilized:  Supportive Counseling Standardized Assessments completed: GAD-7 and PHQ 9  ASSESSMENT: Patient currently experiencing  Schizoaffective disorder and Bipolar affective disorder  Patient may benefit from supportive counseling today.  PLAN: 1. Follow up with behavioral health clinician on : As needed 2. Behavioral recommendations:  -Make upcoming appointment to re-establish care with therapist at [facility] 3. Referral(s): [place] (In Clinic) 4. "From scale of 1-10, how likely are you to follow plan?": -  Garlan Fair, LCSW  Depression screen [facility] 2/9 07/04/2018 06/05/2018 05/23/2018 05/08/2018 04/09/2018  Decreased Interest 0 0 0  0 1  Down, Depressed, Hopeless 0 0 0 0 0  PHQ - 2 Score 0 0 0 0 1  Altered sleeping 1 0 0 1 1  Tired, decreased energy 1 0 0 1 1  Change in appetite 0 0 0 0 0  Feeling bad or failure about yourself  0 0 0 0 0  Trouble concentrating 0 0 0 0 0  Moving slowly or fidgety/restless 0 0 0 0 0  Suicidal thoughts 0 0 0 0 0  PHQ-9 Score 2 0 0 2 3  Difficult doing work/chores - - - - -   GAD 7 : Generalized Anxiety Score 07/04/2018 06/05/2018 05/23/2018 05/08/2018  Nervous, Anxious, on Edge 0 0 0 1  Control/stop worrying 0 0 1 0  Worry too much - different things 0 0 1 1  Trouble relaxing 0 0 1 0  Restless 0 0 2 0  Easily annoyed or irritable 0 0 1 3  Afraid - awful might happen 0 0 1 0  Total GAD 7 Score 0 0 7 5  Anxiety Difficulty - - - -

## 2018-07-04 NOTE — Patient Instructions (Signed)
Return to clinic for any scheduled appointments or obstetric concerns, or go to MAU for evaluation  

## 2018-07-10 ENCOUNTER — Ambulatory Visit (HOSPITAL_COMMUNITY)
Admission: RE | Admit: 2018-07-10 | Discharge: 2018-07-10 | Disposition: A | Discharge status: Home / Self Care | Payer: Medicare (Managed Care) | Source: Ambulatory Visit | Attending: Obstetrics and Gynecology | Admitting: Obstetrics and Gynecology

## 2018-07-10 ENCOUNTER — Encounter (HOSPITAL_COMMUNITY): Payer: Self-pay

## 2018-07-10 ENCOUNTER — Encounter: Payer: Self-pay | Admitting: Obstetrics and Gynecology

## 2018-07-10 DIAGNOSIS — O09519 Supervision of elderly primigravida, unspecified trimester: Secondary | ICD-10-CM

## 2018-07-10 DIAGNOSIS — O09529 Supervision of elderly multigravida, unspecified trimester: Secondary | ICD-10-CM

## 2018-07-10 DIAGNOSIS — Z3A35 35 weeks gestation of pregnancy: Secondary | ICD-10-CM | POA: Diagnosis not present

## 2018-07-10 DIAGNOSIS — O133 Gestational [pregnancy-induced] hypertension without significant proteinuria, third trimester: Secondary | ICD-10-CM

## 2018-07-10 DIAGNOSIS — O99353 Diseases of the nervous system complicating pregnancy, third trimester: Secondary | ICD-10-CM | POA: Diagnosis not present

## 2018-07-10 DIAGNOSIS — O139 Gestational [pregnancy-induced] hypertension without significant proteinuria, unspecified trimester: Secondary | ICD-10-CM | POA: Diagnosis not present

## 2018-07-10 DIAGNOSIS — O0990 Supervision of high risk pregnancy, unspecified, unspecified trimester: Secondary | ICD-10-CM

## 2018-07-10 DIAGNOSIS — F319 Bipolar disorder, unspecified: Secondary | ICD-10-CM | POA: Insufficient documentation

## 2018-07-10 DIAGNOSIS — O99343 Other mental disorders complicating pregnancy, third trimester: Secondary | ICD-10-CM | POA: Insufficient documentation

## 2018-07-10 DIAGNOSIS — O09523 Supervision of elderly multigravida, third trimester: Secondary | ICD-10-CM | POA: Diagnosis not present

## 2018-07-10 DIAGNOSIS — G40909 Epilepsy, unspecified, not intractable, without status epilepticus: Secondary | ICD-10-CM | POA: Insufficient documentation

## 2018-07-10 DIAGNOSIS — Z362 Encounter for other antenatal screening follow-up: Secondary | ICD-10-CM | POA: Diagnosis present

## 2018-07-10 NOTE — BH Specialist Note (Signed)
Integrated Behavioral Health Follow Up Visit  MRN: [id number] Name: Amanda [name]  Number of [place] Clinician visits: 5/6 Session Start time: 10:15  Session End time: 10:35 Total time: 20 minutes  Type of Service: [place] Interpretor:No. Interpretor Name and Language: n/a  SUBJECTIVE: Amanda [name] is a 37 y.o. female accompanied by n/a Patient was referred by Amanda Reeve, MD for untreated [place] issues at last visit. Patient reports the following symptoms/concerns: Pt states her primary concern today is housing instability while living in a hotel and actively seeking more stable housing; pt is looking forward to family members sending baby supplies soon, to prepare for baby's arrival. Pt has not resumed therapy sessions, but intends to return before baby is born.  Duration of problem: Ongoing; Severity of problem: moderate  OBJECTIVE: Mood: Normal and Affect: Appropriate Risk of harm to self or others: No plan to harm self or others  LIFE CONTEXT: Family and Social: Pt and FOB living in [place]; family/extended family of pt and FOB are supportive in helping with baby supplies.  School/Work: - Self-Care: - Life Changes: Current pregnancy and housing instability  GOALS ADDRESSED: Patient will: 1.  Reduce symptoms of: stress  2.  Increase knowledge and/or ability of: stress reduction  3.  Demonstrate ability to: Increase motivation to adhere to plan of care  INTERVENTIONS: Interventions utilized:  Supportive Counseling Standardized Assessments completed: GAD-7 and PHQ 9  ASSESSMENT: Patient currently experiencing Bipolar 2 disorder, Schizoaffective disorder, and Psychosocial stressors  Patient may benefit from continued brief therapeutic intervention regarding coping with current life stress.  PLAN: 1. Follow up with behavioral health clinician on : Postpartum visit, or earlier, as needed 2. Behavioral recommendations:   -Resume appointments with regular therapist at [facility] -Continue to allow family help with practical support (baby supplies, etc.) -Continue to work with housing agencies to find appropriate housing. -Take home Postpartum Planner today; share with FOB  3. Referral(s): [place] (In Clinic) 4. "From scale of 1-10, how likely are you to follow plan?": -  Amanda Fair, LCSW  Depression screen [facility] 2/9 07/11/2018 07/04/2018 06/05/2018 05/23/2018 05/08/2018  Decreased Interest 0 0 0 0 0  Down, Depressed, Hopeless 1 0 0 0 0  PHQ - 2 Score 1 0 0 0 0  Altered sleeping 0 1 0 0 1  Tired, decreased energy 0 1 0 0 1  Change in appetite 0 0 0 0 0  Feeling bad or failure about yourself  0 0 0 0 0  Trouble concentrating 0 0 0 0 0  Moving slowly or fidgety/restless 0 0 0 0 0  Suicidal thoughts 0 0 0 0 0  PHQ-9 Score 1 2 0 0 2  Difficult doing work/chores - - - - -   GAD 7 : Generalized Anxiety Score 07/11/2018 07/04/2018 06/05/2018 05/23/2018  Nervous, Anxious, on Edge 0 0 0 0  Control/stop worrying 1 0 0 1  Worry too much - different things 1 0 0 1  Trouble relaxing 0 0 0 1  Restless 0 0 0 2  Easily annoyed or irritable 1 0 0 1  Afraid - awful might happen 1 0 0 1  Total GAD 7 Score 4 0 0 7  Anxiety Difficulty - - - -

## 2018-07-11 ENCOUNTER — Ambulatory Visit (INDEPENDENT_AMBULATORY_CARE_PROVIDER_SITE_OTHER): Payer: Medicare (Managed Care) | Admitting: Obstetrics and Gynecology

## 2018-07-11 ENCOUNTER — Other Ambulatory Visit (HOSPITAL_COMMUNITY)
Admission: RE | Admit: 2018-07-11 | Discharge: 2018-07-11 | Disposition: A | Discharge status: Home / Self Care | Payer: Medicare (Managed Care) | Source: Ambulatory Visit | Attending: Obstetrics and Gynecology | Admitting: Obstetrics and Gynecology

## 2018-07-11 ENCOUNTER — Ambulatory Visit (INDEPENDENT_AMBULATORY_CARE_PROVIDER_SITE_OTHER): Payer: Medicare (Managed Care) | Admitting: *Deleted

## 2018-07-11 ENCOUNTER — Ambulatory Visit (INDEPENDENT_AMBULATORY_CARE_PROVIDER_SITE_OTHER): Payer: Medicare (Managed Care) | Admitting: Clinical

## 2018-07-11 VITALS — BP 110/72 | HR 77 | Wt 203.1 lb

## 2018-07-11 DIAGNOSIS — F3181 Bipolar II disorder: Secondary | ICD-10-CM | POA: Diagnosis not present

## 2018-07-11 DIAGNOSIS — F259 Schizoaffective disorder, unspecified: Secondary | ICD-10-CM

## 2018-07-11 DIAGNOSIS — O99353 Diseases of the nervous system complicating pregnancy, third trimester: Secondary | ICD-10-CM

## 2018-07-11 DIAGNOSIS — O133 Gestational [pregnancy-induced] hypertension without significant proteinuria, third trimester: Secondary | ICD-10-CM

## 2018-07-11 DIAGNOSIS — Z658 Other specified problems related to psychosocial circumstances: Secondary | ICD-10-CM

## 2018-07-11 DIAGNOSIS — O0990 Supervision of high risk pregnancy, unspecified, unspecified trimester: Secondary | ICD-10-CM

## 2018-07-11 DIAGNOSIS — O0993 Supervision of high risk pregnancy, unspecified, third trimester: Secondary | ICD-10-CM

## 2018-07-11 DIAGNOSIS — Z3A35 35 weeks gestation of pregnancy: Secondary | ICD-10-CM

## 2018-07-11 DIAGNOSIS — G40909 Epilepsy, unspecified, not intractable, without status epilepticus: Secondary | ICD-10-CM

## 2018-07-11 DIAGNOSIS — O09513 Supervision of elderly primigravida, third trimester: Secondary | ICD-10-CM

## 2018-07-11 NOTE — Progress Notes (Signed)
Prenatal Visit Note Date: 07/11/2018 Clinic: [facility]  Subjective:  Amanda [name] is a 37 y.o. G2P0010 at [redacted]w[redacted]d being seen today for ongoing prenatal care.  She is currently monitored for the following issues for this high-risk pregnancy and has Schizoaffective disorder, depressive type ([place]); Seizure disorder ([place]); Supervision of high risk pregnancy, antepartum; Seizure disorder during pregnancy ([place]); Advanced maternal age, primigravida; Bipolar 2 disorder ([place]); Borderline personality disorder ([place]); Major depression, chronic; PTSD (post-traumatic stress disorder); OCD (obsessive compulsive disorder); Asthma; Pap smear of cervix shows high risk HPV present; and Gestational hypertension on their problem list.  Patient reports no complaints.   Contractions: Not present. Vag. Bleeding: None.  Movement: Present. Denies leaking of fluid.   The following portions of the patient's history were reviewed and updated as appropriate: allergies, current medications, past family history, past medical history, past social history, past surgical history and problem list. Problem list updated.  Objective:   Vitals:   07/11/18 1008  BP: 110/72  Pulse: 77  Weight: 203 lb 1.6 oz (92.1 kg)    Fetal Status: Fetal Heart Rate (bpm): 158   Movement: Present     General:  Alert, oriented and cooperative. Patient is in no acute distress.  Skin: Skin is warm and dry. No rash noted.   Cardiovascular: Normal heart rate noted  Respiratory: Normal respiratory effort, no problems with respiration noted  Abdomen: Soft, gravid, appropriate for gestational age. Pain/Pressure: Absent     Pelvic:  Cervical exam deferred        Extremities: Normal range of motion.  Edema: None  Mental Status: Normal mood and affect. Normal behavior. Normal judgment and thought content.   Urinalysis:      Assessment and Plan:  Pregnancy: G2P0010 at [redacted]w[redacted]d  1. Supervision of high risk pregnancy,  antepartum Routine care. Leaning towards IUD - Culture, beta strep (group b only) - GC/Chlamydia probe amp ([place])not at [facility] - Protein / creatinine ratio, urine - Comprehensive metabolic panel - CBC  2. Gestational hypertension, third trimester Surveillance labs today. bpp 8/8  And efw 84% with ac 91% yesterday. nst today. Pt declines any s/s. Will set up 37wk iol today  3. Seizure disorder during pregnancy in third trimester ([place]) No issues on keppra  4. Primigravida of advanced maternal age in third trimester  Preterm labor symptoms and general obstetric precautions including but not limited to vaginal bleeding, contractions, leaking of fluid and fetal movement were reviewed in detail with the patient. Please refer to After Visit Summary for other counseling recommendations.  RTC: 1wk bpp nst rob   Aletha Halim, MD

## 2018-07-11 NOTE — Progress Notes (Signed)
IOL scheduled for 07/24/18 @ 0730

## 2018-07-12 LAB — CBC
Hematocrit: 36.2 % (ref 34.0–46.6)
Hemoglobin: 12.9 g/dL (ref 11.1–15.9)
MCH: 31.4 pg (ref 26.6–33.0)
MCHC: 35.6 g/dL (ref 31.5–35.7)
MCV: 88 fL (ref 79–97)
PLATELETS: 342 10*3/uL (ref 150–450)
RBC: 4.11 x10E6/uL (ref 3.77–5.28)
RDW: 12.9 % (ref 12.3–15.4)
WBC: 13 10*3/uL — ABNORMAL HIGH (ref 3.4–10.8)

## 2018-07-12 LAB — COMPREHENSIVE METABOLIC PANEL
A/G RATIO: 1.4 (ref 1.2–2.2)
ALK PHOS: 142 IU/L — AB (ref 39–117)
ALT: 9 IU/L (ref 0–32)
AST: 10 IU/L (ref 0–40)
Albumin: 3.5 g/dL (ref 3.5–5.5)
BILIRUBIN TOTAL: 0.3 mg/dL (ref 0.0–1.2)
BUN/Creatinine Ratio: 6 — ABNORMAL LOW (ref 9–23)
BUN: 4 mg/dL — ABNORMAL LOW (ref 6–20)
CALCIUM: 9.4 mg/dL (ref 8.7–10.2)
CHLORIDE: 103 mmol/L (ref 96–106)
CO2: 17 mmol/L — ABNORMAL LOW (ref 20–29)
Creatinine, Ser: 0.7 mg/dL (ref 0.57–1.00)
GFR calc Af Amer: 127 mL/min/{1.73_m2} (ref >59)
GFR, EST NON AFRICAN AMERICAN: 110 mL/min/{1.73_m2} (ref >59)
GLOBULIN, TOTAL: 2.5 g/dL (ref 1.5–4.5)
Glucose: 107 mg/dL — ABNORMAL HIGH (ref 65–99)
POTASSIUM: 3.6 mmol/L (ref 3.5–5.2)
SODIUM: 136 mmol/L (ref 134–144)
Total Protein: 6 g/dL (ref 6.0–8.5)

## 2018-07-12 LAB — PROTEIN / CREATININE RATIO, URINE
CREATININE, UR: 100.4 mg/dL
PROTEIN UR: 25.7 mg/dL
Protein/Creat Ratio: 256 mg/g creat — ABNORMAL HIGH (ref 0–200)

## 2018-07-14 LAB — CERVICOVAGINAL ANCILLARY ONLY
CHLAMYDIA, DNA PROBE: NEGATIVE
NEISSERIA GONORRHEA: NEGATIVE

## 2018-07-15 ENCOUNTER — Telehealth (HOSPITAL_COMMUNITY): Payer: Self-pay | Admitting: *Deleted

## 2018-07-15 LAB — CULTURE, BETA STREP (GROUP B ONLY): Strep Gp B Culture: NEGATIVE

## 2018-07-15 NOTE — Telephone Encounter (Signed)
Preadmission screen  

## 2018-07-16 ENCOUNTER — Ambulatory Visit (INDEPENDENT_AMBULATORY_CARE_PROVIDER_SITE_OTHER): Payer: Medicare (Managed Care) | Admitting: General Practice

## 2018-07-16 ENCOUNTER — Ambulatory Visit: Payer: Self-pay

## 2018-07-16 ENCOUNTER — Ambulatory Visit (INDEPENDENT_AMBULATORY_CARE_PROVIDER_SITE_OTHER): Payer: Medicare (Managed Care) | Admitting: Obstetrics & Gynecology

## 2018-07-16 VITALS — BP 107/71 | HR 67 | Wt 205.4 lb

## 2018-07-16 DIAGNOSIS — O0990 Supervision of high risk pregnancy, unspecified, unspecified trimester: Secondary | ICD-10-CM

## 2018-07-16 DIAGNOSIS — O133 Gestational [pregnancy-induced] hypertension without significant proteinuria, third trimester: Secondary | ICD-10-CM | POA: Diagnosis not present

## 2018-07-16 DIAGNOSIS — Z3A35 35 weeks gestation of pregnancy: Secondary | ICD-10-CM

## 2018-07-16 DIAGNOSIS — O0993 Supervision of high risk pregnancy, unspecified, third trimester: Secondary | ICD-10-CM

## 2018-07-16 NOTE — Progress Notes (Signed)
Pt informed that the ultrasound is considered a limited OB ultrasound and is not intended to be a complete ultrasound exam.  Patient also informed that the ultrasound is not being completed with the intent of assessing for fetal or placental anomalies or any pelvic abnormalities.  Explained that the purpose of today's ultrasound is to assess for  BPP, presentation and AFI.  Patient acknowledges the purpose of the exam and the limitations of the study.    Koren Bound RN BSN 07/16/18

## 2018-07-16 NOTE — Patient Instructions (Signed)

## 2018-07-16 NOTE — Progress Notes (Signed)
   PRENATAL VISIT NOTE  Subjective:  Amanda [name] is a 37 y.o. G2P0010 at [redacted]w[redacted]d being seen today for ongoing prenatal care.  She is currently monitored for the following issues for this high-risk pregnancy and has Schizoaffective disorder, depressive type ([place]); Seizure disorder ([place]); Supervision of high risk pregnancy, antepartum; Seizure disorder during pregnancy ([place]); Advanced maternal age, primigravida; Bipolar 2 disorder ([place]); Borderline personality disorder ([place]); Major depression, chronic; PTSD (post-traumatic stress disorder); OCD (obsessive compulsive disorder); Asthma; Pap smear of cervix shows high risk HPV present; and Gestational hypertension on their problem list.  Patient reports no complaints.  Contractions: Not present. Vag. Bleeding: None.  Movement: Present. Denies leaking of fluid.   The following portions of the patient's history were reviewed and updated as appropriate: allergies, current medications, past family history, past medical history, past social history, past surgical history and problem list. Problem list updated.  Objective:   Vitals:   07/16/18 1027  BP: 107/71  Pulse: 67  Weight: 205 lb 6.4 oz (93.2 kg)    Fetal Status: Fetal Heart Rate (bpm): 157   Movement: Present     General:  Alert, oriented and cooperative. Patient is in no acute distress.  Skin: Skin is warm and dry. No rash noted.   Cardiovascular: Normal heart rate noted  Respiratory: Normal respiratory effort, no problems with respiration noted  Abdomen: Soft, gravid, appropriate for gestational age.  Pain/Pressure: Present     Pelvic: Cervical exam deferred        Extremities: Normal range of motion.  Edema: Trace  Mental Status: Normal mood and affect. Normal behavior. Normal judgment and thought content.   Assessment and Plan:  Pregnancy: G2P0010 at [redacted]w[redacted]d  1. Supervision of high risk pregnancy, antepartum IOL 37 weeks  2. Gestational hypertension, third trimester Normal  BP  Preterm labor symptoms and general obstetric precautions including but not limited to vaginal bleeding, contractions, leaking of fluid and fetal movement were reviewed in detail with the patient. Please refer to After Visit Summary for other counseling recommendations.  Return in about 1 week (around 07/23/2018).  Future Appointments  Date Time Provider [place]  07/16/2018 11:15 AM WOC-WOCA NST WOC-WOCA WOC  07/21/2018  9:15 AM WOC-WOCA NST WOC-WOCA WOC  07/21/2018 10:15 AM Woodroe Mode, MD WOC-WOCA WOC  07/24/2018  7:30 AM WH-BSSCHED ROOM WH-BSSCHED None    Emeterio Reeve, MD

## 2018-07-19 ENCOUNTER — Other Ambulatory Visit: Payer: Self-pay

## 2018-07-19 ENCOUNTER — Emergency Department (HOSPITAL_COMMUNITY)
Admission: EM | Admit: 2018-07-19 | Discharge: 2018-07-19 | Disposition: A | Discharge status: Home / Self Care | Payer: Medicare (Managed Care) | Attending: Emergency Medicine | Admitting: Emergency Medicine

## 2018-07-19 ENCOUNTER — Encounter (HOSPITAL_COMMUNITY): Payer: Self-pay | Admitting: Emergency Medicine

## 2018-07-19 ENCOUNTER — Other Ambulatory Visit: Payer: Self-pay | Admitting: Advanced Practice Midwife

## 2018-07-19 DIAGNOSIS — F17210 Nicotine dependence, cigarettes, uncomplicated: Secondary | ICD-10-CM | POA: Insufficient documentation

## 2018-07-19 DIAGNOSIS — Z91040 Latex allergy status: Secondary | ICD-10-CM | POA: Insufficient documentation

## 2018-07-19 DIAGNOSIS — Z3A33 33 weeks gestation of pregnancy: Secondary | ICD-10-CM | POA: Insufficient documentation

## 2018-07-19 DIAGNOSIS — K029 Dental caries, unspecified: Secondary | ICD-10-CM | POA: Insufficient documentation

## 2018-07-19 DIAGNOSIS — M069 Rheumatoid arthritis, unspecified: Secondary | ICD-10-CM | POA: Insufficient documentation

## 2018-07-19 DIAGNOSIS — F319 Bipolar disorder, unspecified: Secondary | ICD-10-CM | POA: Diagnosis not present

## 2018-07-19 DIAGNOSIS — Z79899 Other long term (current) drug therapy: Secondary | ICD-10-CM | POA: Insufficient documentation

## 2018-07-19 DIAGNOSIS — K0889 Other specified disorders of teeth and supporting structures: Secondary | ICD-10-CM | POA: Diagnosis present

## 2018-07-19 MED ORDER — PENICILLIN V POTASSIUM 500 MG PO TABS: 500.0000 mg | ORAL_TABLET | Freq: Four times a day (QID) | ORAL | 0 refills | Status: DC

## 2018-07-19 NOTE — ED Notes (Signed)
Patient verbalizes understanding of discharge instructions. Opportunity for questioning and answers were provided. Armband removed by staff, pt discharged from ED home via POV.  

## 2018-07-19 NOTE — ED Provider Notes (Signed)
[facility] EMERGENCY DEPARTMENT Provider Note   CSN: [id number] Arrival date & time: 07/19/18  0010     History   Chief Complaint Chief Complaint  Patient presents with  . Dental Pain    HPI Amanda [name] is a 37 y.o. female.  HPI 37 yo F with PMHx as below here with dental pain. Pt states over the past 1 day, she has had progressively worsening aching, throbbing, left lower tooth pain. She has multiple, reported severe dental caries that "always" bother her. However, she was eating food the other day and believes she may have irritated the tooth, causing it to become painful. Denies any tongue swelling. Pain is aching, throbbing, with mild improvement w/ tylenol. No fevers. No difficulty swallowing. No facial swelling. No SOB. Pain is worse w/ eating. Of note, pt is [redacted] wk pregnant at this time - no VB, abd pain, LOF, or ctx.  Past Medical History:  Diagnosis Date  . Anxiety   . Asthma   . Asthma due to environmental allergies   . Asthma due to seasonal allergies   . Bipolar 1 disorder ([place])   . Borderline personality disorder ([place])   . Brain bleed ([place])   . Chronic post-traumatic stress disorder (PTSD)    complex chronic with psychotic features and self harm behaviors  . Dander (animal) allergy   . Hearing loss    right ear  . Heart murmur   . Hip dysplasia   . Major depression, chronic   . Mood disorder ([place])   . OCD (obsessive compulsive disorder)   . RA (rheumatoid arthritis) ([place])   . Rheumatoid arthritis ([place])   . Seizure disorder ([place])   . Suicidal ideations   . Suicide attempt [facility])     Patient Active Problem List   Diagnosis Date Noted  . Gestational hypertension 06/18/2018  . Pap smear of cervix shows high risk HPV present 01/14/2018  . Supervision of high risk pregnancy, antepartum 01/08/2018  . Seizure disorder during pregnancy ([place]) 01/08/2018  . Advanced maternal age, primigravida 01/08/2018  . Bipolar 2 disorder ([place]) 01/08/2018    . Borderline personality disorder ([place]) 01/08/2018  . Major depression, chronic 01/08/2018  . PTSD (post-traumatic stress disorder) 01/08/2018  . OCD (obsessive compulsive disorder) 01/08/2018  . Asthma   . Seizure disorder ([place]) 11/06/2017  . Schizoaffective disorder, depressive type ([place]) 08/16/2017    Past Surgical History:  Procedure Laterality Date  . EYE SURGERY  1985  . SHUNT REMOVAL  1983  . Tooth Removal     multiple     OB History    Gravida  2   Para  0   Term  0   Preterm  0   AB  1   Living  0     SAB  1   TAB  0   Ectopic  0   Multiple      Live Births               Home Medications    Prior to Admission medications   Medication Sig Start Date End Date Taking? Authorizing Provider  butalbital-acetaminophen-caffeine (FIORICET, ESGIC) 50-325-40 MG tablet Take 2 tablets by mouth every 6 (six) hours as needed for headache. 06/19/18   Woodroe Mode, MD  EPINEPHrine 0.3 mg/0.3 mL IJ SOAJ injection Inject 0.3 mg into the muscle as needed (for allergic reaction).     [provider]  labetalol (NORMODYNE) 200 MG tablet Take 1 tablet (  200 mg total) by mouth 3 (three) times daily. 07/02/18   Leftwich-Kirby, Kathie Dike, CNM  levETIRAcetam (KEPPRA) 500 MG tablet Take 1 tablet (500 mg total) by mouth 2 (two) times daily. 02/16/18   Kinnie Feil, PA-C  penicillin v potassium (VEETID) 500 MG tablet Take 1 tablet (500 mg total) by mouth 4 (four) times daily for 7 days. 07/19/18 07/26/18  Duffy Bruce, MD  Prenatal Multivit-Min-Fe-FA (PRENATAL VITAMINS) 0.8 MG tablet Take 1 tablet by mouth daily. 01/14/18   Donnamae Jude, MD    Family History Family History  Problem Relation Age of Onset  . Heart attack Mother   . Stroke Mother   . Liver cancer Mother   . Diabetes Mother   . Lung cancer Mother   . Alcoholism Father   . Sleep apnea Brother   . Depression Brother   . ADD / ADHD Brother   . Diabetes Maternal Aunt   . Diabetes Paternal  Uncle   . Diabetes Maternal Grandmother     Social History Social History   Tobacco Use  . Smoking status: Current Every Day Smoker    Packs/day: 0.25    Types: Cigarettes  . Smokeless tobacco: Never Used  Substance Use Topics  . Alcohol use: Not Currently    Frequency: Never  . Drug use: Not Currently    Types: Marijuana    Comment:  last used in Jan 2019 due to "RA" per pt     Allergies   Aspartame and phenylalanine; Benadryl [diphenhydramine]; Other; Scallops [shellfish allergy]; Yellow jacket venom [bee venom]; Pollen extract; Tegretol [carbamazepine]; Adhesive [tape]; and Latex   Review of Systems Review of Systems  Constitutional: Negative for chills, fatigue and fever.  HENT: Positive for dental problem. Negative for congestion and rhinorrhea.   Eyes: Negative for visual disturbance.  Respiratory: Negative for cough, shortness of breath and wheezing.   Cardiovascular: Negative for chest pain and leg swelling.  Gastrointestinal: Negative for abdominal pain, diarrhea, nausea and vomiting.  Genitourinary: Negative for dysuria and flank pain.  Musculoskeletal: Negative for neck pain and neck stiffness.  Skin: Negative for rash and wound.  Allergic/Immunologic: Negative for immunocompromised state.  Neurological: Negative for syncope, weakness and headaches.  All other systems reviewed and are negative.    Physical Exam Updated Vital Signs BP 127/75 (BP Location: Right Arm)   Pulse 60   Temp 98.3 F (36.8 C) (Rectal)   Resp 18   Ht 5\' 8"  (1.727 m)   Wt 93 kg   LMP 11/07/2017 (Exact Date)   SpO2 99%   BMI 31.17 kg/m   Physical Exam  Constitutional: She is oriented to person, place, and time. She appears well-developed and well-nourished. No distress.  HENT:  Head: Normocephalic and atraumatic.  Markedly poor dentition with multiple active caries. Left lower premolar with mild gingival inflammation. No gingival abscess. No facial edema or erythema.  Sublingual space is soft.   Eyes: Conjunctivae are normal.  Neck: Neck supple.  Cardiovascular: Normal rate, regular rhythm and normal heart sounds. Exam reveals no friction rub.  No murmur heard. Pulmonary/Chest: Effort normal and breath sounds normal. No respiratory distress. She has no wheezes. She has no rales.  Abdominal: She exhibits no distension.  Musculoskeletal: She exhibits no edema.  Neurological: She is alert and oriented to person, place, and time. She exhibits normal muscle tone.  Skin: Skin is warm. Capillary refill takes less than 2 seconds.  Psychiatric: She has a normal mood and affect.  Nursing note and vitals reviewed.    ED Treatments / Results  Labs (all labs ordered are listed, but only abnormal results are displayed) Labs Reviewed - No data to display  EKG None  Radiology No results found.  Procedures Procedures (including critical care time)  Medications Ordered in ED Medications - No data to display   Initial Impression / Assessment and Plan / ED Course  I have reviewed the triage vital signs and the nursing notes.  Pertinent labs & imaging results that were available during my care of the patient were reviewed by me and considered in my medical decision making (see chart for details).     37 yo F currently pregnant here w/ left lower premolar pain, likely 2/2 active dental caries. No signs of facial or gingival abscess. He is afebrile, well appearing. No signs of Ludwig's or deep neck/dental infection. Will place on Penicillin, d/c with outpatient follow-up. She will notify her OBGYN about starting ABX.  Final Clinical Impressions(s) / ED Diagnoses   Final diagnoses:  Dental caries    ED Discharge Orders         Ordered    penicillin v potassium (VEETID) 500 MG tablet  4 times daily     07/19/18 0039           Duffy Bruce, MD 07/19/18 [phone]

## 2018-07-19 NOTE — ED Triage Notes (Signed)
Pt reports breaking a tooth back in June and about 3 hours ago the tooth started to bother her.

## 2018-07-21 ENCOUNTER — Ambulatory Visit (INDEPENDENT_AMBULATORY_CARE_PROVIDER_SITE_OTHER): Payer: Self-pay | Admitting: Clinical

## 2018-07-21 ENCOUNTER — Ambulatory Visit (INDEPENDENT_AMBULATORY_CARE_PROVIDER_SITE_OTHER): Payer: Medicare (Managed Care) | Admitting: Obstetrics & Gynecology

## 2018-07-21 ENCOUNTER — Ambulatory Visit (INDEPENDENT_AMBULATORY_CARE_PROVIDER_SITE_OTHER): Payer: Medicare (Managed Care) | Admitting: General Practice

## 2018-07-21 ENCOUNTER — Ambulatory Visit: Payer: Self-pay

## 2018-07-21 VITALS — BP 114/71 | HR 66 | Wt 211.0 lb

## 2018-07-21 DIAGNOSIS — O0990 Supervision of high risk pregnancy, unspecified, unspecified trimester: Secondary | ICD-10-CM

## 2018-07-21 DIAGNOSIS — F259 Schizoaffective disorder, unspecified: Secondary | ICD-10-CM

## 2018-07-21 DIAGNOSIS — O09513 Supervision of elderly primigravida, third trimester: Secondary | ICD-10-CM

## 2018-07-21 DIAGNOSIS — O133 Gestational [pregnancy-induced] hypertension without significant proteinuria, third trimester: Secondary | ICD-10-CM

## 2018-07-21 DIAGNOSIS — O0993 Supervision of high risk pregnancy, unspecified, third trimester: Secondary | ICD-10-CM

## 2018-07-21 DIAGNOSIS — Z658 Other specified problems related to psychosocial circumstances: Secondary | ICD-10-CM

## 2018-07-21 DIAGNOSIS — F3181 Bipolar II disorder: Secondary | ICD-10-CM

## 2018-07-21 LAB — POCT URINALYSIS DIP (DEVICE)
Bilirubin Urine: NEGATIVE
Glucose, UA: NEGATIVE mg/dL
Hgb urine dipstick: NEGATIVE
KETONES UR: NEGATIVE mg/dL
Leukocytes, UA: NEGATIVE
Nitrite: NEGATIVE
Protein, ur: NEGATIVE mg/dL
Specific Gravity, Urine: 1.015 (ref 1.005–1.030)
Urobilinogen, UA: 1 mg/dL (ref 0.0–1.0)
pH: 7 (ref 5.0–8.0)

## 2018-07-21 NOTE — BH Specialist Note (Signed)
Integrated Behavioral Health Initial Visit  MRN: [id number] Name: Amanda [name]  Number of [facility] Clinician visits:: 6/6 Session Start time: 11:42  Session End time: 12:19 Total time: 35 minutes  Type of Service: [place] Interpretor:No. Interpretor Name and Language: n/a   Warm Hand Off Completed.       SUBJECTIVE: Amanda [name] is a 37 y.o. female accompanied by Partner/Significant Other Patient was referred by self today for problem-solving. Patient reports the following symptoms/concerns: Pt states her primary concern today is feeling anxious as she gets closer to birth, plans to move into a new residence today, and setting up for new baby's arrival. Pt's and FOB's families are giving supplies for baby this week, including a car seat; pt and FOB are looking forward to meeting their baby, and feel they will be prepared before induction.  Duration of problem: Current pregnancy; Severity of problem: mild  OBJECTIVE: Mood: Anxious and Affect: Appropriate Risk of harm to self or others: No plan to harm self or others  LIFE CONTEXT: Family and Social: Pt lives with FOB; Pt and FOB's families supportive School/Work: FOB works Self-Care: - Life Changes: Current pregnancy; housing instability  GOALS ADDRESSED: Patient will: 1. Reduce symptoms of: anxiety and stress 2. Increase knowledge and/or ability of: stress reduction  3. Demonstrate ability to: Increase healthy adjustment to current life circumstances  INTERVENTIONS: Interventions utilized: Supportive Counseling  Standardized Assessments completed: GAD-7 and PHQ 9  ASSESSMENT: Patient currently experiencing Bipolar 2 disorder, Schizoaffective disorder, as previously diagnosed by psychiatry, and Psychosocial stress.   Patient may benefit from ongoing therapeutic interventions regarding coping with current life stress.  PLAN: 1. Follow up with behavioral health  clinician on : At postpartum visit 2. Behavioral recommendations:  -Continue with plans to move into new residence tonight; prepare new home for baby's arrival -Resume appointments with regular therapist at [facility] to allow both families to help with practical support through the postpartum period 3. Referral(s): [facility] (In Clinic) 4. "From scale of 1-10, how likely are you to follow plan?": 10  Garlan Fair, LCSW  Depression screen [facility] 2/9 07/21/2018 07/16/2018 07/11/2018 07/04/2018 06/05/2018  Decreased Interest 0 0 0 0 0  Down, Depressed, Hopeless 0 0 1 0 0  PHQ - 2 Score 0 0 1 0 0  Altered sleeping 0 0 0 1 0  Tired, decreased energy 1 0 0 1 0  Change in appetite 0 0 0 0 0  Feeling bad or failure about yourself  0 0 0 0 0  Trouble concentrating 0 0 0 0 0  Moving slowly or fidgety/restless 0 0 0 0 0  Suicidal thoughts 0 0 0 0 0  PHQ-9 Score 1 0 1 2 0  Difficult doing work/chores - - - - -  Some recent data might be hidden   GAD 7 : Generalized Anxiety Score 07/21/2018 07/16/2018 07/11/2018 07/04/2018  Nervous, Anxious, on Edge 1 0 0 0  Control/stop worrying 0 0 1 0  Worry too much - different things 1 0 1 0  Trouble relaxing 1 0 0 0  Restless 0 0 0 0  Easily annoyed or irritable 1 0 1 0  Afraid - awful might happen 0 0 1 0  Total GAD 7 Score 4 0 4 0  Anxiety Difficulty - - - -

## 2018-07-21 NOTE — Progress Notes (Signed)
   PRENATAL VISIT NOTE  Subjective:  Amanda [name] is a 37 y.o. G2P0010 at [redacted]w[redacted]d being seen today for ongoing prenatal care.  She is currently monitored for the following issues for this high-risk pregnancy and has Schizoaffective disorder, depressive type ([place]); Seizure disorder ([place]); Supervision of high risk pregnancy, antepartum; Seizure disorder during pregnancy ([place]); Advanced maternal age, primigravida; Bipolar 2 disorder ([place]); Borderline personality disorder ([place]); Major depression, chronic; PTSD (post-traumatic stress disorder); OCD (obsessive compulsive disorder); Asthma; Pap smear of cervix shows high risk HPV present; and Gestational hypertension on their problem list.  Patient reports no complaints.  Contractions: Not present. Vag. Bleeding: None.  Movement: Present. Denies leaking of fluid.   The following portions of the patient's history were reviewed and updated as appropriate: allergies, current medications, past family history, past medical history, past social history, past surgical history and problem list. Problem list updated.  Objective:   Vitals:   07/21/18 1004  BP: 114/71  Pulse: 66  Weight: 211 lb (95.7 kg)    Fetal Status: Fetal Heart Rate (bpm): NST   Movement: Present     General:  Alert, oriented and cooperative. Patient is in no acute distress.  Skin: Skin is warm and dry. No rash noted.   Cardiovascular: Normal heart rate noted  Respiratory: Normal respiratory effort, no problems with respiration noted  Abdomen: Soft, gravid, appropriate for gestational age.  Pain/Pressure: Present     Pelvic: Cervical exam deferred        Extremities: Normal range of motion.  Edema: Mild pitting, slight indentation  Mental Status: Normal mood and affect. Normal behavior. Normal judgment and thought content.   Assessment and Plan:  Pregnancy: G2P0010 at [redacted]w[redacted]d  1. Gestational hypertension, third trimester IOL 37 weeks  2. Supervision of high risk pregnancy,  antepartum   3. Primigravida of advanced maternal age in third trimester NST reactive today  Preterm labor symptoms and general obstetric precautions including but not limited to vaginal bleeding, contractions, leaking of fluid and fetal movement were reviewed in detail with the patient. Please refer to After Visit Summary for other counseling recommendations.  Return in about 4 weeks (around 08/18/2018).  Future Appointments  Date Time Provider [place]  07/24/2018  7:30 AM [facility]SS[facility]ED ROOM [facility]ED None    Emeterio Reeve, MD

## 2018-07-21 NOTE — Patient Instructions (Signed)

## 2018-07-21 NOTE — Progress Notes (Signed)
Pt informed that the ultrasound is considered a limited OB ultrasound and is not intended to be a complete ultrasound exam.  Patient also informed that the ultrasound is not being completed with the intent of assessing for fetal or placental anomalies or any pelvic abnormalities.  Explained that the purpose of today's ultrasound is to assess for  BPP, presentation and AFI.  Patient acknowledges the purpose of the exam and the limitations of the study.    Koren Bound RN BSN 07/21/18

## 2018-07-24 ENCOUNTER — Encounter (HOSPITAL_COMMUNITY): Payer: Self-pay

## 2018-07-24 ENCOUNTER — Inpatient Hospital Stay (HOSPITAL_COMMUNITY)
Admission: AD | Admit: 2018-07-24 | Discharge: 2018-07-28 | DRG: 806 | Disposition: A | Discharge status: Home / Self Care | Payer: Medicare (Managed Care) | Attending: Obstetrics and Gynecology | Admitting: Obstetrics and Gynecology

## 2018-07-24 ENCOUNTER — Other Ambulatory Visit: Payer: Self-pay

## 2018-07-24 DIAGNOSIS — Z3490 Encounter for supervision of normal pregnancy, unspecified, unspecified trimester: Secondary | ICD-10-CM

## 2018-07-24 DIAGNOSIS — F603 Borderline personality disorder: Secondary | ICD-10-CM | POA: Diagnosis present

## 2018-07-24 DIAGNOSIS — O99353 Diseases of the nervous system complicating pregnancy, third trimester: Secondary | ICD-10-CM

## 2018-07-24 DIAGNOSIS — O99354 Diseases of the nervous system complicating childbirth: Secondary | ICD-10-CM | POA: Diagnosis present

## 2018-07-24 DIAGNOSIS — O139 Gestational [pregnancy-induced] hypertension without significant proteinuria, unspecified trimester: Secondary | ICD-10-CM | POA: Diagnosis present

## 2018-07-24 DIAGNOSIS — F4310 Post-traumatic stress disorder, unspecified: Secondary | ICD-10-CM | POA: Diagnosis present

## 2018-07-24 DIAGNOSIS — O09513 Supervision of elderly primigravida, third trimester: Secondary | ICD-10-CM

## 2018-07-24 DIAGNOSIS — F251 Schizoaffective disorder, depressive type: Secondary | ICD-10-CM | POA: Diagnosis present

## 2018-07-24 DIAGNOSIS — F17210 Nicotine dependence, cigarettes, uncomplicated: Secondary | ICD-10-CM | POA: Diagnosis present

## 2018-07-24 DIAGNOSIS — O0990 Supervision of high risk pregnancy, unspecified, unspecified trimester: Secondary | ICD-10-CM

## 2018-07-24 DIAGNOSIS — F3181 Bipolar II disorder: Secondary | ICD-10-CM | POA: Diagnosis present

## 2018-07-24 DIAGNOSIS — G40909 Epilepsy, unspecified, not intractable, without status epilepticus: Secondary | ICD-10-CM | POA: Diagnosis present

## 2018-07-24 DIAGNOSIS — O99350 Diseases of the nervous system complicating pregnancy, unspecified trimester: Secondary | ICD-10-CM

## 2018-07-24 DIAGNOSIS — O09519 Supervision of elderly primigravida, unspecified trimester: Secondary | ICD-10-CM

## 2018-07-24 DIAGNOSIS — O99334 Smoking (tobacco) complicating childbirth: Secondary | ICD-10-CM | POA: Diagnosis present

## 2018-07-24 DIAGNOSIS — O134 Gestational [pregnancy-induced] hypertension without significant proteinuria, complicating childbirth: Secondary | ICD-10-CM | POA: Diagnosis present

## 2018-07-24 DIAGNOSIS — Z3A37 37 weeks gestation of pregnancy: Secondary | ICD-10-CM | POA: Diagnosis not present

## 2018-07-24 DIAGNOSIS — O99344 Other mental disorders complicating childbirth: Secondary | ICD-10-CM | POA: Diagnosis present

## 2018-07-24 DIAGNOSIS — F429 Obsessive-compulsive disorder, unspecified: Secondary | ICD-10-CM | POA: Diagnosis present

## 2018-07-24 DIAGNOSIS — Z30430 Encounter for insertion of intrauterine contraceptive device: Secondary | ICD-10-CM | POA: Diagnosis present

## 2018-07-24 DIAGNOSIS — J45909 Unspecified asthma, uncomplicated: Secondary | ICD-10-CM | POA: Diagnosis present

## 2018-07-24 LAB — CBC
HEMATOCRIT: 37 % (ref 36.0–46.0)
Hemoglobin: 12.4 g/dL (ref 12.0–15.0)
MCH: 30.5 pg (ref 26.0–34.0)
MCHC: 33.5 g/dL (ref 30.0–36.0)
MCV: 90.9 fL (ref 80.0–100.0)
Platelets: 313 10*3/uL (ref 150–400)
RBC: 4.07 MIL/uL (ref 3.87–5.11)
RDW: 14.1 % (ref 11.5–15.5)
WBC: 14.8 10*3/uL — ABNORMAL HIGH (ref 4.0–10.5)

## 2018-07-24 LAB — TYPE AND SCREEN
ABO/RH(D): O POS
Antibody Screen: NEGATIVE

## 2018-07-24 LAB — RPR: RPR Ser Ql: NONREACTIVE

## 2018-07-24 MED ORDER — OXYTOCIN BOLUS FROM INFUSION
500.0000 mL | Freq: Once | INTRAVENOUS | Status: AC
  Administered 2018-07-26: 500 mL via INTRAVENOUS

## 2018-07-24 MED ORDER — PENICILLIN V POTASSIUM 500 MG PO TABS
500.0000 mg | ORAL_TABLET | Freq: Four times a day (QID) | ORAL | Status: DC
  Administered 2018-07-24 – 2018-07-25 (×5): 500 mg via ORAL
  Filled 2018-07-24 (×9): qty 1

## 2018-07-24 MED ORDER — OXYCODONE-ACETAMINOPHEN 5-325 MG PO TABS: 2.0000 | ORAL_TABLET | ORAL | Status: DC | PRN

## 2018-07-24 MED ORDER — ACETAMINOPHEN 325 MG PO TABS: 650.0000 mg | ORAL_TABLET | ORAL | Status: DC | PRN

## 2018-07-24 MED ORDER — LEVETIRACETAM 500 MG PO TABS
500.0000 mg | ORAL_TABLET | Freq: Two times a day (BID) | ORAL | Status: DC
  Administered 2018-07-24 – 2018-07-28 (×8): 500 mg via ORAL
  Filled 2018-07-24 (×11): qty 1

## 2018-07-24 MED ORDER — SOD CITRATE-CITRIC ACID 500-334 MG/5ML PO SOLN
30.0000 mL | ORAL | Status: DC | PRN
  Filled 2018-07-24: qty 15

## 2018-07-24 MED ORDER — TERBUTALINE SULFATE 1 MG/ML IJ SOLN
0.2500 mg | Freq: Once | INTRAMUSCULAR | Status: AC | PRN
  Administered 2018-07-24: 0.25 mg via SUBCUTANEOUS
  Filled 2018-07-24: qty 1

## 2018-07-24 MED ORDER — MISOPROSTOL 50MCG HALF TABLET
50.0000 ug | ORAL_TABLET | ORAL | Status: DC | PRN
  Administered 2018-07-24 (×2): 50 ug via BUCCAL
  Filled 2018-07-24 (×3): qty 1

## 2018-07-24 MED ORDER — ONDANSETRON HCL 4 MG/2ML IJ SOLN
4.0000 mg | Freq: Four times a day (QID) | INTRAMUSCULAR | Status: DC | PRN
  Administered 2018-07-24 – 2018-07-25 (×3): 4 mg via INTRAVENOUS
  Filled 2018-07-24 (×3): qty 2

## 2018-07-24 MED ORDER — LACTATED RINGERS IV SOLN
INTRAVENOUS | Status: DC
  Administered 2018-07-24 – 2018-07-25 (×7): via INTRAVENOUS

## 2018-07-24 MED ORDER — ZOLPIDEM TARTRATE 5 MG PO TABS: 5.0000 mg | ORAL_TABLET | Freq: Every evening | ORAL | Status: DC | PRN

## 2018-07-24 MED ORDER — OXYTOCIN 40 UNITS IN LACTATED RINGERS INFUSION - SIMPLE MED
2.5000 [IU]/h | INTRAVENOUS | Status: DC
  Filled 2018-07-24: qty 1000

## 2018-07-24 MED ORDER — OXYCODONE-ACETAMINOPHEN 5-325 MG PO TABS: 1.0000 | ORAL_TABLET | ORAL | Status: DC | PRN

## 2018-07-24 MED ORDER — LACTATED RINGERS IV SOLN
500.0000 mL | INTRAVENOUS | Status: DC | PRN
  Administered 2018-07-24: 1000 mL via INTRAVENOUS

## 2018-07-24 MED ORDER — LABETALOL HCL 200 MG PO TABS
200.0000 mg | ORAL_TABLET | Freq: Three times a day (TID) | ORAL | Status: DC
  Administered 2018-07-24 (×2): 200 mg via ORAL
  Filled 2018-07-24 (×3): qty 1

## 2018-07-24 MED ORDER — PENICILLIN V POTASSIUM 500 MG PO TABS
500.0000 mg | ORAL_TABLET | Freq: Four times a day (QID) | ORAL | Status: DC
  Filled 2018-07-24 (×2): qty 1

## 2018-07-24 MED ORDER — FENTANYL CITRATE (PF) 100 MCG/2ML IJ SOLN
100.0000 ug | INTRAMUSCULAR | Status: DC | PRN
  Administered 2018-07-24 (×3): 100 ug via INTRAVENOUS
  Filled 2018-07-24 (×3): qty 2

## 2018-07-24 MED ORDER — LIDOCAINE HCL (PF) 1 % IJ SOLN
30.0000 mL | INTRAMUSCULAR | Status: AC | PRN
  Administered 2018-07-26: 30 mL via SUBCUTANEOUS
  Filled 2018-07-24: qty 30

## 2018-07-24 NOTE — Progress Notes (Signed)
Patient ID: Amanda [name], female   DOB: [birth year], 37 y.o.   MRN: [id number]  S/p one dose of cytotec; just had Fentanyl to help her relax for foley placement  BP 122/66, other VSS FHR 140s., +accels; Cat 1; had some variables earlier that improved with Terb x 1 Ctx irreg, mild Cx 1 ext os/FT int os/thick/-3  IUP@37 .0wks gHTN Cx unfavorable  Cervical foley placement unsuccessful; will give another dose of cytotec for ripening  Myrtis Ser CNM 07/24/2018

## 2018-07-24 NOTE — H&P (Addendum)
OBSTETRIC ADMISSION HISTORY AND PHYSICAL  Amanda [name] is a 37 y.o. 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 She reports +FMs. No LOF, VB, blurry vision, headaches, peripheral edema, or RUQ pain. She plans on breastfeeding. She requests condoms for birth control.  Dating: By L/12 --->  Estimated Date of Delivery: 08/14/18  Sono:   @[redacted]w[redacted]d , CWD, normal anatomy, cephalic presentation, 6644 g, 84%ile   Prenatal History/Complications: gHTN AMA Asthma Hx of  Bipolar 2 Disorder Schizoaffective Seizure Disorder  Past Medical History: Past Medical History:  Diagnosis Date  . Anxiety   . Asthma   . Asthma due to environmental allergies   . Asthma due to seasonal allergies   . Bipolar 1 disorder ([place])   . Borderline personality disorder ([place])   . Brain bleed ([place])   . Chronic post-traumatic stress disorder (PTSD)    complex chronic with psychotic features and self harm behaviors  . Dander (animal) allergy   . Hearing loss    right ear  . Heart murmur   . Hip dysplasia   . Major depression, chronic   . Mood disorder ([place])   . OCD (obsessive compulsive disorder)   . RA (rheumatoid arthritis) ([place])   . Rheumatoid arthritis ([place])   . Seizure disorder ([place])   . Suicidal ideations   . Suicide attempt [facility])     Past Surgical History: Past Surgical History:  Procedure Laterality Date  . EYE SURGERY  1985  . SHUNT REMOVAL  1983  . Tooth Removal     multiple    Obstetrical History: OB History    Gravida  2   Para  0   Term  0   Preterm  0   AB  1   Living  0     SAB  1   TAB  0   Ectopic  0   Multiple      Live Births              Social History: Social History   Socioeconomic History  . Marital status: Legally Separated    Spouse name: Not on file  . Number of children: Not on file  . Years of education: Not on file  . Highest  education level: Not on file  Occupational History  . Not on file  Social Needs  . Financial resource strain: Not on file  . Food insecurity:    Worry: Not on file    Inability: Not on file  . Transportation needs:    Medical: Not on file    Non-medical: Not on file  Tobacco Use  . Smoking status: Current Every Day Smoker    Packs/day: 0.25    Types: Cigarettes  . Smokeless tobacco: Never Used  Substance and Sexual Activity  . Alcohol use: Not Currently    Frequency: Never  . Drug use: Not Currently    Types: Marijuana    Comment:  last used in Jan 2019 due to "RA" per pt  . Sexual activity: Not Currently    Birth control/protection: None    Comment: pregnant  Lifestyle  . Physical activity:    Days per week: 0 days    Minutes per session: Not on file  . Stress: Not on file  Relationships  . Social connections:    Talks on phone: Not on file    Gets together: Not on file  Attends religious service: Not on file    Active member of club or organization: Not on file    Attends meetings of clubs or organizations: Not on file    Relationship status: Not on file  Other Topics Concern  . Not on file  Social History Narrative   ** Merged History Encounter **       Lives at home with her friend Marylyn Ishihara & 2 other roommates Right handed     Family History: Family History  Problem Relation Age of Onset  . Heart attack Mother   . Stroke Mother   . Liver cancer Mother   . Diabetes Mother   . Lung cancer Mother   . Alcoholism Father   . Sleep apnea Brother   . Depression Brother   . ADD / ADHD Brother   . Diabetes Maternal Aunt   . Diabetes Paternal Uncle   . Diabetes Maternal Grandmother   . Dementia Maternal Grandmother     Allergies: Allergies  Allergen Reactions  . Aspartame And Phenylalanine Anaphylaxis, Hives and Diarrhea  . Benadryl [Diphenhydramine] Anaphylaxis, Diarrhea and Other (See Comments)    Blisters also  . Other Anaphylaxis, Nausea And  Vomiting, Rash and Other (See Comments)    Aspartame- Blisters, also Dust- Worsens asthma Ragweed- Worsens asthma, face gets red, and sneezing Animal Fur/Dander- Worsens asthma and sneezing    . Scallops [Shellfish Allergy] Anaphylaxis, Diarrhea and Nausea And Vomiting  . Yellow Jacket Venom [Bee Venom] Anaphylaxis, Diarrhea and Nausea And Vomiting    Seizures and numbness  . Pollen Extract Other (See Comments)    Runny nose, eyes, and asthma worsens  . Tegretol [Carbamazepine] Hives, Diarrhea and Other (See Comments)    Blisters in mouth and increase in seizures  . Adhesive [Tape] Rash  . Latex Hives and Rash    Blisters, also- Condoms and dental encounters    Medications Prior to Admission  Medication Sig Dispense Refill Last Dose  . butalbital-acetaminophen-caffeine (FIORICET, ESGIC) 50-325-40 MG tablet Take 2 tablets by mouth every 6 (six) hours as needed for headache. 14 tablet 0 Taking  . EPINEPHrine 0.3 mg/0.3 mL IJ SOAJ injection Inject 0.3 mg into the muscle as needed (for allergic reaction).    Taking  . labetalol (NORMODYNE) 200 MG tablet Take 1 tablet (200 mg total) by mouth 3 (three) times daily. 90 tablet 3 Taking  . levETIRAcetam (KEPPRA) 500 MG tablet Take 1 tablet (500 mg total) by mouth 2 (two) times daily. 30 tablet 0 Taking  . penicillin v potassium (VEETID) 500 MG tablet Take 1 tablet (500 mg total) by mouth 4 (four) times daily for 7 days. (Patient not taking: Reported on 07/21/2018) 28 tablet 0 Not Taking  . Prenatal Multivit-Min-Fe-FA (PRENATAL VITAMINS) 0.8 MG tablet Take 1 tablet by mouth daily. 30 tablet 12 Taking     Review of Systems   All systems reviewed and negative except as stated in HPI  Blood pressure 121/77, pulse 71, temperature 97.9 F (36.6 C), temperature source Oral, resp. rate 18, height 5\' 6"  (1.676 m), weight 95.6 kg, last menstrual period 11/07/2017. General appearance: alert, cooperative and no distress Lungs: regular rate and  effort Heart: regular rate  Abdomen: soft, non-tender Extremities: Homans sign is negative, no sign of DVT Presentation: cephalic Fetal monitoringBaseline: 150 bpm, Variability: Good {> 6 bpm), Accelerations: Reactive and Decelerations: Absent Uterine activity: Q2-65min Dilation: Closed Effacement (%): 50 Station: Ballotable Exam by:: Lynnda Child, CNM(via [place])   Prenatal labs: ABO, Rh: --/--/  O POS (12/05 0759) Antibody: NEG (12/05 0759) Rubella: 2.52 (05/22 1317) RPR: Non Reactive (10/04 0910)  HBsAg: Negative (05/22 1317)  HIV: Non Reactive (10/04 0910)  GBS: Negative (11/02 0000)  2 hr GTT Normal in third tri  Prenatal Transfer Tool  Maternal Diabetes: No Genetic Screening: Normal Maternal Ultrasounds/Referrals: Normal Fetal Ultrasounds or other Referrals:  None Maternal Substance Abuse:  No Significant Maternal Medications:  Meds include: Other: Keppra, Fioricet, labetalol Significant Maternal Lab Results: Lab values include: Group B Strep negative  Results for orders placed or performed during the hospital encounter of 07/24/18 (from the past 24 hour(s))  CBC   Collection Time: 07/24/18  7:59 AM  Result Value Ref Range   WBC 14.8 (H) 4.0 - 10.5 K/uL   RBC 4.07 3.87 - 5.11 MIL/uL   Hemoglobin 12.4 12.0 - 15.0 g/dL   HCT 37.0 36.0 - 46.0 %   MCV 90.9 80.0 - 100.0 fL   MCH 30.5 26.0 - 34.0 pg   MCHC 33.5 30.0 - 36.0 g/dL   RDW 14.1 11.5 - 15.5 %   Platelets 313 150 - 400 K/uL  Type and screen   Collection Time: 07/24/18  7:59 AM  Result Value Ref Range   ABO/RH(D) O POS    Antibody Screen NEG    Sample Expiration      07/27/2018 Performed at [facility], [address], [place], [place] [address]     Patient Active Problem List   Diagnosis Date Noted  . Encounter for induction of labor 07/24/2018  . Gestational hypertension 06/18/2018  . Pap smear of cervix shows high risk HPV present 01/14/2018  . Supervision of high risk pregnancy, antepartum 01/08/2018   . Seizure disorder during pregnancy ([place]) 01/08/2018  . Advanced maternal age, primigravida 01/08/2018  . Bipolar 2 disorder ([place]) 01/08/2018  . Borderline personality disorder ([place]) 01/08/2018  . Major depression, chronic 01/08/2018  . PTSD (post-traumatic stress disorder) 01/08/2018  . OCD (obsessive compulsive disorder) 01/08/2018  . Asthma   . Seizure disorder ([place]) 11/06/2017  . Schizoaffective disorder, depressive type ([place]) 08/16/2017    Assessment: Amanda [name] is a 37 y.o. female G2P0010 with IUP at [redacted]w[redacted]d presenting for IOL due to uncontrolled gHTN with complex medical management of seizure disorder and history of cerebral vascular malformations.   1. Labor: No SOL 2. FWB: Reactive strip, reassuring FHT 3. Pain: Chronic pain. Desires epidural pending anesthesia evaluation. 4. GBS: Negative   Plan: 1. Labor: Fetus reacted poorly to initial Cytotec (see labor note). Foley placed. 2. FWB: Reactive strip, reassuring FHT. Continue to monitor. 3. Pain: Chronic pain.  4. GBS: Negative 5. GHTN: Labetalol 200 mg TID,  6: Seizure Disorder: Keppra 500 mg BID  Adline Potter, MD  07/24/2018, 9:42 AM  CNM attestation:  I have seen and examined this patient; I agree with above documentation in the resident's note.   Amanda [name] is a 37 y.o. G2P0010 here for IOL due to gHTN; also with hx of cerebral vascular malformations  PE: BP 122/66   Pulse 64   Temp 98.3 F (36.8 C) (Oral)   Resp 16   Ht 5\' 6"  (1.676 m)   Wt 95.6 kg   LMP 11/07/2017 (Exact Date)   BMI 34.02 kg/m  Gen: calm comfortable, NAD Resp: normal effort, no distress Abd: gravid  ROS, labs, PMH reviewed  Plan: Admit to [facility] cx ripening with cytotec, cervical foley when able, Pit/AROM Planning for vag delivery (neg MRI 10/2017)  Myrtis Ser CNM 07/24/2018, 2:25 PM

## 2018-07-24 NOTE — Progress Notes (Signed)
LABOR PROGRESS NOTE  Shay Jhaveri is a 37 y.o. 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  Subjective: Attempted placement of foley balloon approximately 1700. Balloon was placed and palpated as being at the os, however on repeat exam by nursing foley was in vagina. Nursing exam found cervix 1 cm but with sensation of a "turn" within the cervical canal and a hard band at the posterior internal os.   Objective: BP 119/74   Pulse 80   Temp 98.5 F (36.9 C) (Oral)   Resp 18   Ht 5\' 6"  (1.676 m)   Wt 95.6 kg   LMP 11/07/2017 (Exact Date)   BMI 34.02 kg/m  or  Vitals:   07/24/18 1500 07/24/18 1600 07/24/18 1620 07/24/18 1721  BP: 124/72 130/68 124/70 119/74  Pulse: 65 70 66 80  Resp: 16 18  18   Temp:   98.5 F (36.9 C)   TempSrc:   Oral   Weight:      Height:        Dilation: Fingertip(1 external os) Effacement (%): Thick Station: Ballotable Presentation: Vertex Exam by:: Lynnda Child, CNM FHT: baseline rate 140s, moderate variability, +acel, no decels Toco: Present q31min. S/p terbutaline earlier in afternoon due to late decels.  Labs: Lab Results  Component Value Date   WBC 14.8 (H) 07/24/2018   HGB 12.4 07/24/2018   HCT 37.0 07/24/2018   MCV 90.9 07/24/2018   PLT 313 07/24/2018    Patient Active Problem List   Diagnosis Date Noted  . Encounter for induction of labor 07/24/2018  . Gestational hypertension 06/18/2018  . Pap smear of cervix shows high risk HPV present 01/14/2018  . Supervision of high risk pregnancy, antepartum 01/08/2018  . Seizure disorder during pregnancy ([place]) 01/08/2018  . Advanced maternal age, primigravida 01/08/2018  . Bipolar 2 disorder ([place]) 01/08/2018  . Borderline personality disorder ([place]) 01/08/2018  . Major depression, chronic 01/08/2018  . PTSD (post-traumatic stress disorder) 01/08/2018  . OCD (obsessive compulsive  disorder) 01/08/2018  . Asthma   . Seizure disorder ([place]) 11/06/2017  . Schizoaffective disorder, depressive type ([place]) 08/16/2017    Assessment / Plan: Lavonna Lampron is a 37 y.o. female G2P0010 with IUP at [redacted]w[redacted]d presenting for IOL due to uncontrolled gHTN, complex medical history including seizure disorder and concern for vascular malformations. Malformations not obvious on 10/2017 MRI brain, patient did not follow up for MRA.  Labor: Foley balloon not successful. Continuing Cytotec. Fetal Wellbeing: No decels currently. Continue to monitor. Pain Control:  Desires epidural but waiting for further labor progress. Vomiting with fentanyl. Anticipated MOD:  Vaginal   Oswaldo Milian, MD PGY-1 Family Medicine Resident

## 2018-07-24 NOTE — Progress Notes (Signed)
LABOR PROGRESS NOTE  Atianna Haidar is a 37 y.o. 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  Subjective: Patient had 6 minute decel to the 60s from 140s within half an hour of Cytotec administration while up to use the bathroom.   Objective: BP 125/70   Pulse 63   Temp 97.9 F (36.6 C) (Oral)   Resp 18   Ht 5\' 6"  (1.676 m)   Wt 95.6 kg   LMP 11/07/2017 (Exact Date)   BMI 34.02 kg/m  or  Vitals:   07/24/18 0721 07/24/18 0906 07/24/18 1014 07/24/18 1120  BP: 103/71 121/77 122/74 125/70  Pulse: 85 71 73 63  Resp: 16 18  18   Temp: 97.9 F (36.6 C)     TempSrc: Oral     Weight: 95.6 kg     Height: 5\' 6"  (1.676 m)       Dilation: 1 Effacement (%): 50 Station: [place], -3 Presentation: Vertex Exam by:: Rolanda Jay RNC FHT: baseline rate 140s after position change and fetal scalp stim, moderate variability, +acel Toco: Present q61min  Labs: Lab Results  Component Value Date   WBC 14.8 (H) 07/24/2018   HGB 12.4 07/24/2018   HCT 37.0 07/24/2018   MCV 90.9 07/24/2018   PLT 313 07/24/2018    Patient Active Problem List   Diagnosis Date Noted  . Encounter for induction of labor 07/24/2018  . Gestational hypertension 06/18/2018  . Pap smear of cervix shows high risk HPV present 01/14/2018  . Supervision of high risk pregnancy, antepartum 01/08/2018  . Seizure disorder during pregnancy ([place]) 01/08/2018  . Advanced maternal age, primigravida 01/08/2018  . Bipolar 2 disorder ([place]) 01/08/2018  . Borderline personality disorder ([place]) 01/08/2018  . Major depression, chronic 01/08/2018  . PTSD (post-traumatic stress disorder) 01/08/2018  . OCD (obsessive compulsive disorder) 01/08/2018  . Asthma   . Seizure disorder ([place]) 11/06/2017  . Schizoaffective disorder, depressive type ([place]) 08/16/2017    Assessment / Plan: Avrianna Smart is a 37 y.o. female G2P0010 with  IUP at [redacted]w[redacted]d presenting for IOL due to uncontrolled gHTN, complex medical history including seizure disorder and concern for vascular malformations. Malformations not obvious on 10/2017 MRI brain, MRA pending.  Labor: Foley balloon placed with concern for poor fetal tolerance of Cytotec. Fetal Wellbeing:  Recovered from decel Pain Control:  Desires epidural, in discussion with anesthesia Anticipated MOD:  Vaginal   Oswaldo Milian, MD PGY-1 Family Medicine Resident

## 2018-07-24 NOTE — Progress Notes (Signed)
CSW acknowledged consult and will meet with patient after L&D.  Laurey Arrow, MSW, LCSW Clinical Social Work [phone]

## 2018-07-24 NOTE — Progress Notes (Signed)
Vitals:   07/24/18 1900 07/24/18 1927  BP: 134/84 124/64  Pulse: (!) 58 61  Resp: 18 18  Temp:  97.8 F (36.6 C)   Foley inserted and inflated w/60cc H20.  Cx 1/thick/-3. FHR Cat 1. Mild ctx q 1-3 minutes.  Will hold cytotec. Start pitocin when foley falls out.

## 2018-07-25 ENCOUNTER — Inpatient Hospital Stay (HOSPITAL_COMMUNITY): Payer: Medicare (Managed Care) | Admitting: Anesthesiology

## 2018-07-25 LAB — CBC
HCT: 36.8 % (ref 36.0–46.0)
Hemoglobin: 12.3 g/dL (ref 12.0–15.0)
MCH: 30.9 pg (ref 26.0–34.0)
MCHC: 33.4 g/dL (ref 30.0–36.0)
MCV: 92.5 fL (ref 80.0–100.0)
NRBC: 0 % (ref 0.0–0.2)
Platelets: 288 10*3/uL (ref 150–400)
RBC: 3.98 MIL/uL (ref 3.87–5.11)
RDW: 13.7 % (ref 11.5–15.5)
WBC: 19.9 10*3/uL — ABNORMAL HIGH (ref 4.0–10.5)

## 2018-07-25 MED ORDER — TERBUTALINE SULFATE 1 MG/ML IJ SOLN
0.2500 mg | Freq: Once | INTRAMUSCULAR | Status: DC | PRN
  Filled 2018-07-25: qty 1

## 2018-07-25 MED ORDER — LIDOCAINE HCL (PF) 1 % IJ SOLN
INTRAMUSCULAR | Status: DC | PRN
  Administered 2018-07-25 (×2): 5 mL via EPIDURAL

## 2018-07-25 MED ORDER — PHENYLEPHRINE 40 MCG/ML (10ML) SYRINGE FOR IV PUSH (FOR BLOOD PRESSURE SUPPORT)
80.0000 ug | PREFILLED_SYRINGE | INTRAVENOUS | Status: DC | PRN
  Filled 2018-07-25 (×2): qty 10

## 2018-07-25 MED ORDER — DIPHENHYDRAMINE HCL 50 MG/ML IJ SOLN: 12.5000 mg | INTRAMUSCULAR | Status: DC | PRN

## 2018-07-25 MED ORDER — LACTATED RINGERS AMNIOINFUSION
INTRAVENOUS | Status: DC
  Administered 2018-07-25: 21:00:00 via INTRAUTERINE
  Administered 2018-07-25: 300 mL via INTRAUTERINE
  Filled 2018-07-25 (×2): qty 1000

## 2018-07-25 MED ORDER — EPHEDRINE 5 MG/ML INJ
10.0000 mg | INTRAVENOUS | Status: DC | PRN
  Filled 2018-07-25: qty 2

## 2018-07-25 MED ORDER — FENTANYL 2.5 MCG/ML BUPIVACAINE 1/10 % EPIDURAL INFUSION (WH - ANES)
14.0000 mL/h | INTRAMUSCULAR | Status: DC | PRN
  Administered 2018-07-25 (×3): 14 mL/h via EPIDURAL
  Filled 2018-07-25 (×3): qty 100

## 2018-07-25 MED ORDER — OXYTOCIN 40 UNITS IN LACTATED RINGERS INFUSION - SIMPLE MED
1.0000 m[IU]/min | INTRAVENOUS | Status: DC
  Administered 2018-07-25: 2 m[IU]/min via INTRAVENOUS

## 2018-07-25 MED ORDER — PHENYLEPHRINE 40 MCG/ML (10ML) SYRINGE FOR IV PUSH (FOR BLOOD PRESSURE SUPPORT)
80.0000 ug | PREFILLED_SYRINGE | INTRAVENOUS | Status: DC | PRN
  Administered 2018-07-25 (×2): 80 ug via INTRAVENOUS
  Filled 2018-07-25: qty 10

## 2018-07-25 MED ORDER — LACTATED RINGERS IV SOLN
500.0000 mL | Freq: Once | INTRAVENOUS | Status: AC
  Administered 2018-07-25: 500 mL via INTRAVENOUS

## 2018-07-25 NOTE — Progress Notes (Signed)
Amanda [name] is a 37 y.o. G2P0010 at [redacted]w[redacted]d admitted for induction of labor due to [facility]   Objective: BP 115/65   Pulse 64   Temp 98.1 F (36.7 C) (Oral)   Resp 16   Ht 5\' 6"  (1.676 m)   Wt 95.6 kg   LMP 11/07/2017 (Exact Date)   BMI 34.02 kg/m  No intake/output data recorded. No intake/output data recorded.  FHT:  Category 2, variables noted with return to baseline and moderate variability  UC:   irregular, every 2-5 minutes SVE:   Dilation: 4 Effacement (%): 80 Station: -3, Ballotable Exam by:: [employer] RNC  Labs: Lab Results  Component Value Date   WBC 19.9 (H) 07/25/2018   HGB 12.3 07/25/2018   HCT 36.8 07/25/2018   MCV 92.5 07/25/2018   PLT 288 07/25/2018    Assessment / Plan: 37yo G2P0 at 37 wk 1 day being induced for GHTN, on 44mu/min pitocin    Preeclampsia:  labs stable Fetal Wellbeing:  Category II Pain Control:  Epidural I/D:  n/a Anticipated MOD:  NSVD  Using peanut ball to facilitate fetal descent   Amanda [name] Amanda [name] 07/25/2018, 10:59 AM

## 2018-07-25 NOTE — Progress Notes (Signed)
LABOR PROGRESS NOTE  Amanda [name] is a 37 y.o. G2P0010 at [redacted]w[redacted]d admitted for IOL for gHTN s/p cyto x2, foley bulb,  currently on pitocin. Progressing appropriately.   Subjective: Reports the baby "feels lower" and seems to be moving less. Pain is well controlled. Has vomited intermittently, but currently without n/v.   Objective: BP 118/63   Pulse 63   Temp 98.2 F (36.8 C) (Oral)   Resp 18   Ht 5\' 6"  (1.676 m)   Wt 95.6 kg   LMP 11/07/2017 (Exact Date)   BMI 34.02 kg/m  or  Vitals:   07/25/18 2123 07/25/18 2131 07/25/18 2202 07/25/18 2232  BP:  127/68 117/71 118/63  Pulse:  68 78 63  Resp:  18 18   Temp: 98.2 F (36.8 C)     TempSrc: Oral     Weight:      Height:        Dilation: 10 Dilation Complete Date: 07/25/18 Dilation Complete Time: 2205 Effacement (%): 80 Cervical Position: Middle(left) Station: 0 Presentation: Vertex Exam by:: Derrill Memo, CNM FHT: baseline rate 155, moderate varibility, late decels UC: q3-4 min  Labs: Lab Results  Component Value Date   WBC 19.9 (H) 07/25/2018   HGB 12.3 07/25/2018   HCT 36.8 07/25/2018   MCV 92.5 07/25/2018   PLT 288 07/25/2018    Patient Active Problem List   Diagnosis Date Noted  . Encounter for induction of labor 07/24/2018  . Gestational hypertension 06/18/2018  . Pap smear of cervix shows high risk HPV present 01/14/2018  . Supervision of high risk pregnancy, antepartum 01/08/2018  . Seizure disorder during pregnancy ([place]) 01/08/2018  . Advanced maternal age, primigravida 01/08/2018  . Bipolar 2 disorder ([place]) 01/08/2018  . Borderline personality disorder ([place]) 01/08/2018  . Major depression, chronic 01/08/2018  . PTSD (post-traumatic stress disorder) 01/08/2018  . OCD (obsessive compulsive disorder) 01/08/2018  . Asthma   . Seizure disorder ([place]) 11/06/2017  . Schizoaffective disorder, depressive type ([place]) 08/16/2017    Assessment / Plan: 37 y.o. G2P0010 at [redacted]w[redacted]d here for IOL 2/2 gHTN. She is  progressing well and currently laboring down. Her epidural effect is strong--anesthesia consulted on turning down from 14 to 10 to facilitate.   Labor: Cervical exam progressing from prior  Fetal Wellbeing:  Continued monitoring, late decels  Pain Control:  Very well controlled, reducing epidural from 14 to 10 Anticipated MOD:  [place], MS3  07/25/2018, 10:38 PM   I confirm that I have verified the information documented in the med student's note and that I have also personally reperformed the physical exam and all medical decision making activities.  Myrtis Ser CNM 07/26/2018 1:53 AM

## 2018-07-25 NOTE — Anesthesia Procedure Notes (Signed)
Epidural Patient location during procedure: OB  Staffing Anesthesiologist: Kary Sugrue, MD Performed: anesthesiologist   Preanesthetic Checklist Completed: patient identified, site marked, surgical consent, pre-op evaluation, timeout performed, IV checked, risks and benefits discussed and monitors and equipment checked  Epidural Patient position: sitting Prep: DuraPrep Patient monitoring: heart rate, continuous pulse ox and blood pressure Approach: right paramedian Location: L3-L4 Injection technique: LOR saline  Needle:  Needle type: Tuohy  Needle gauge: 17 G Needle length: 9 cm and 9 Needle insertion depth: 7 cm Catheter type: closed end flexible Catheter size: 20 Guage Catheter at skin depth: 11 cm Test dose: negative  Assessment Events: blood not aspirated, injection not painful, no injection resistance, negative IV test and no paresthesia  Additional Notes Patient identified. Risks/Benefits/Options discussed with patient including but not limited to bleeding, infection, nerve damage, paralysis, failed block, incomplete pain control, headache, blood pressure changes, nausea, vomiting, reactions to medication both or allergic, itching and postpartum back pain. Confirmed with bedside nurse the patient's most recent platelet count. Confirmed with patient that they are not currently taking any anticoagulation, have any bleeding history or any family history of bleeding disorders. Patient expressed understanding and wished to proceed. All questions were answered. Sterile technique was used throughout the entire procedure. Please see nursing notes for vital signs. Test dose was given through epidural needle and negative prior to continuing to dose epidural or start infusion. Warning signs of high block given to the patient including shortness of breath, tingling/numbness in hands, complete motor block, or any concerning symptoms with instructions to call for help. Patient was given  instructions on fall risk and not to get out of bed. All questions and concerns addressed with instructions to call with any issues.     

## 2018-07-25 NOTE — Anesthesia Preprocedure Evaluation (Addendum)
Anesthesia Evaluation  Patient identified by MRN, date of birth, ID band Patient awake    Reviewed: Allergy & Precautions, H&P , NPO status , Patient's Chart, lab work & pertinent test results  History of Anesthesia Complications Negative for: history of anesthetic complications  Airway Mallampati: II  TM Distance: >3 FB Neck ROM: full    Dental no notable dental hx. (+) Teeth Intact   Pulmonary asthma , Current Smoker,    Pulmonary exam normal breath sounds clear to auscultation       Cardiovascular hypertension (PIH), Normal cardiovascular exam Rhythm:regular Rate:Normal     Neuro/Psych Seizures -,  PSYCHIATRIC DISORDERS (extensive psychiatric history. OCD. Borderline personality) Anxiety Depression Bipolar Disorder    GI/Hepatic negative GI ROS, Neg liver ROS,   Endo/Other  negative endocrine ROS  Renal/GU negative Renal ROS  negative genitourinary   Musculoskeletal  (+) Arthritis , Rheumatoid disorders,    Abdominal   Peds  Hematology negative hematology ROS (+)   Anesthesia Other Findings Poor historian  Reproductive/Obstetrics (+) Pregnancy (PIH)                             Anesthesia Physical Anesthesia Plan  ASA: III  Anesthesia Plan: Epidural   Post-op Pain Management:    Induction:   PONV Risk Score and Plan:   Airway Management Planned:   Additional Equipment:   Intra-op Plan:   Post-operative Plan:   Informed Consent: I have reviewed the patients History and Physical, chart, labs and discussed the procedure including the risks, benefits and alternatives for the proposed anesthesia with the patient or authorized representative who has indicated his/her understanding and acceptance.     Plan Discussed with:   Anesthesia Plan Comments:         Anesthesia Quick Evaluation

## 2018-07-25 NOTE — Progress Notes (Signed)
Amanda [name] is a 37 y.o. G2P0010 at [redacted]w[redacted]d admitted for IOL for GHTN s/p cyto X2, foley bulb, currently on pitocin   Subjective:  Doing well, pain well controlled with epidural. c/o nausea   Objective: BP (!) 98/54 (BP Location: Right Arm)   Pulse 61   Temp 99.6 F (37.6 C) (Oral)   Resp 18   Ht 5\' 6"  (1.676 m)   Wt 95.6 kg   LMP 11/07/2017 (Exact Date)   BMI 34.02 kg/m  I/O last 3 completed shifts: In: -  Out: 2550 [Urine:2550] No intake/output data recorded.  FHT: category 2 strip. Recurrent variables currently noted with moderate variability and return to baseline   UC:   Regular every 3-4 minutes, MVUs adequate 220-240 SVE:   Dilation: 5 Effacement (%): 80 Station: -2 Exam by:: [employer] RNC  Labs: Lab Results  Component Value Date   WBC 19.9 (H) 07/25/2018   HGB 12.3 07/25/2018   HCT 36.8 07/25/2018   MCV 92.5 07/25/2018   PLT 288 07/25/2018    Assessment / Plan:  37YO G2P0010 at 37w 1 day being induced for GHTN  Labor: IOL currently on 17mu/min of pitocin, internalized  Preeclampsia:  labs stable Fetal Wellbeing:  Category II Pain Control:  Epidural I/D:  n/a Anticipated MOD:  NSVD   Hold pitocin at 76mu/min and continue facilitate maternal/fetal oxygenation via position changes.   [name] Mayur Duman 07/25/2018, 7:40 PM

## 2018-07-25 NOTE — Progress Notes (Addendum)
LABOR PROGRESS NOTE  Amanda [name] is a 37 y.o. G2P0010 at [redacted]w[redacted]d admitted for IOL 2/2 gHTN  Subjective: Patient is resting comfortable in bed.  Objective: BP (!) 121/49   Pulse 69   Temp 97.9 F (36.6 C) (Oral)   Resp 16   Ht 5\' 6"  (1.676 m)   Wt 95.6 kg   LMP 11/07/2017 (Exact Date)   BMI 34.02 kg/m  or  Vitals:   07/24/18 2248 07/24/18 2346 07/25/18 0154 07/25/18 0155  BP: (!) 151/61 105/70  (!) 121/49  Pulse: 60 61  69  Resp: 18 16  16   Temp:   97.9 F (36.6 C)   TempSrc:   Oral   Weight:      Height:      diastolic BP low- normal on recheck 130/59 ~0340  ~2100 Dilation: 1 Effacement (%): Thick Station: -3 Presentation: Vertex Exam by:: Manus Gunning CNM FHT: baseline rate 135, moderate varibility, positive acel, negative decel Toco: 1.5-2.5 minutes  Labs: Lab Results  Component Value Date   WBC 14.8 (H) 07/24/2018   HGB 12.4 07/24/2018   HCT 37.0 07/24/2018   MCV 90.9 07/24/2018   PLT 313 07/24/2018    Patient Active Problem List   Diagnosis Date Noted  . Encounter for induction of labor 07/24/2018  . Gestational hypertension 06/18/2018  . Pap smear of cervix shows high risk HPV present 01/14/2018  . Supervision of high risk pregnancy, antepartum 01/08/2018  . Seizure disorder during pregnancy ([place]) 01/08/2018  . Advanced maternal age, primigravida 01/08/2018  . Bipolar 2 disorder ([place]) 01/08/2018  . Borderline personality disorder ([place]) 01/08/2018  . Major depression, chronic 01/08/2018  . PTSD (post-traumatic stress disorder) 01/08/2018  . OCD (obsessive compulsive disorder) 01/08/2018  . Asthma   . Seizure disorder ([place]) 11/06/2017  . Schizoaffective disorder, depressive type ([place]) 08/16/2017    Assessment / Plan: 37 y.o. G2P0010 at [redacted]w[redacted]d here for IOL for gHTN  Labor: patient has FB in place and is otherwise expectant management Fetal Wellbeing:  Category I Pain Control:  Planning for epidural. Last got IV pain meds 2238 Anticipated MOD:   vaginal  Hadia Minier DO 07/25/2018, 3:26 AM

## 2018-07-25 NOTE — Progress Notes (Signed)
  Subjective:  Amanda [name] is a 37yo G2P0010 at [redacted]w[redacted]d here for IOL for GHTN.  Pain well managed with epidural  s/p cytoX2, foley.  s/p pitocin max dose of 20 mu/min. Pitocin turned off at 1230 due to recurrent variables.    Objective: BP (!) 148/89   Pulse (!) 193   Temp 98.1 F (36.7 C) (Oral)   Resp 16   Ht 5\' 6"  (1.676 m)   Wt 95.6 kg   LMP 11/07/2017 (Exact Date)   BMI 34.02 kg/m  No intake/output data recorded. Total I/O In: -  Out: 5093 [Urine:1050]  FHT:  Category 1 currently  UC:   Irregular, every 2-5 minutes SVE:   Dilation: 5 Effacement (%): 80 Station: [place], -3 Exam by:: Amanda [name] CNM  AROM: 2671 clear, copious amount of odorless fluid  IUPC inserted  FSE placed  Labs: Lab Results  Component Value Date   WBC 19.9 (H) 07/25/2018   HGB 12.3 07/25/2018   HCT 36.8 07/25/2018   MCV 92.5 07/25/2018   PLT 288 07/25/2018    Assessment / Plan: Amanda [name] is a 37yo G2P0010 at [redacted]w[redacted]d here for IOL for GHTN   Labor: latent labor Preeclampsia:  labs stable Fetal Wellbeing: currently a category 1 strip, was a category 2 prior to pitocin being stopped Pain Control:  Epidural I/D:  n/a Anticipated MOD:  NSVD  Plan is to restart pitocin at 26mu/min and titrate accordingly Recheck 2-4 hours post adequate labor   Amanda [name] 07/25/2018, 1:50 PM

## 2018-07-25 NOTE — Anesthesia Pain Management Evaluation Note (Signed)
  CRNA Pain Management Visit Note  Patient: Amanda [name], 37 y.o., female  "Hello I am a member of the anesthesia team at [facility]. We have an anesthesia team available at all times to provide care throughout the hospital, including epidural management and anesthesia for C-section. I don't know your plan for the delivery whether it a natural birth, water birth, IV sedation, nitrous supplementation, doula or epidural, but we want to meet your pain goals."   1.Was your pain managed to your expectations on prior hospitalizations?   No prior hospitalizations  2.What is your expectation for pain management during this hospitalization?     Epidural  3.How can we help you reach that goal? Epidural in place at time of visit  Record the patient's initial score and the patient's pain goal.   Pain: 0  Pain Goal: 4 [facility] wants you to be able to say your pain was always managed very well.  Rayvon Char 07/25/2018

## 2018-07-25 NOTE — Progress Notes (Signed)
LABOR PROGRESS NOTE  Amanda [name] is a 37 y.o. G2P0010 at [redacted]w[redacted]d  admitted for IOL 2/2 gHTN  Subjective: Patient resting comfortably in bed eating. Her FB fell out spontaneously.   Objective: BP (!) 130/59   Pulse 62   Temp 97.9 F (36.6 C) (Oral)   Resp 15   Ht 5\' 6"  (1.676 m)   Wt 95.6 kg   LMP 11/07/2017 (Exact Date)   BMI 34.02 kg/m  or  Vitals:   07/24/18 2346 07/25/18 0154 07/25/18 0155 07/25/18 0340  BP: 105/70  (!) 121/49 (!) 130/59  Pulse: 61  69 62  Resp: 16  16 15   Temp:  97.9 F (36.6 C)    TempSrc:  Oral    Weight:      Height:        ~0430 Dilation: 5.5 Effacement (%): 50 Cervical Position: Middle Station: -3 Presentation: Vertex Exam by:: Dr Emilee Hero: baseline rate 135, moderate varibility, positive acel, negative decel Toco: 1.5-2.5 minutes, mild  Labs: Lab Results  Component Value Date   WBC 14.8 (H) 07/24/2018   HGB 12.4 07/24/2018   HCT 37.0 07/24/2018   MCV 90.9 07/24/2018   PLT 313 07/24/2018    Patient Active Problem List   Diagnosis Date Noted  . Encounter for induction of labor 07/24/2018  . Gestational hypertension 06/18/2018  . Pap smear of cervix shows high risk HPV present 01/14/2018  . Supervision of high risk pregnancy, antepartum 01/08/2018  . Seizure disorder during pregnancy ([place]) 01/08/2018  . Advanced maternal age, primigravida 01/08/2018  . Bipolar 2 disorder ([place]) 01/08/2018  . Borderline personality disorder ([place]) 01/08/2018  . Major depression, chronic 01/08/2018  . PTSD (post-traumatic stress disorder) 01/08/2018  . OCD (obsessive compulsive disorder) 01/08/2018  . Asthma   . Seizure disorder ([place]) 11/06/2017  . Schizoaffective disorder, depressive type ([place]) 08/16/2017    Assessment / Plan: 37 y.o. G2P0010 at [redacted]w[redacted]d here for IOL 2/2 HTN  Labor: FB spontaneously removed. Cervix progressing to ~5.5/50%/-3. Begin pitocin at this time after epidural placement Fetal Wellbeing:  Category I Pain Control:   Patient requests epidural at this time Anticipated MOD:  vaginal  Vincent Ehrler DO 07/25/2018, 4:45 AM

## 2018-07-26 ENCOUNTER — Encounter (HOSPITAL_COMMUNITY): Payer: Self-pay

## 2018-07-26 DIAGNOSIS — Z3A37 37 weeks gestation of pregnancy: Secondary | ICD-10-CM

## 2018-07-26 DIAGNOSIS — O134 Gestational [pregnancy-induced] hypertension without significant proteinuria, complicating childbirth: Secondary | ICD-10-CM

## 2018-07-26 LAB — CBC
HCT: 32.3 % — ABNORMAL LOW (ref 36.0–46.0)
Hemoglobin: 10.8 g/dL — ABNORMAL LOW (ref 12.0–15.0)
MCH: 30.8 pg (ref 26.0–34.0)
MCHC: 33.4 g/dL (ref 30.0–36.0)
MCV: 92 fL (ref 80.0–100.0)
Platelets: 283 10*3/uL (ref 150–400)
RBC: 3.51 MIL/uL — ABNORMAL LOW (ref 3.87–5.11)
RDW: 13.8 % (ref 11.5–15.5)
WBC: 29.2 10*3/uL — ABNORMAL HIGH (ref 4.0–10.5)
nRBC: 0 % (ref 0.0–0.2)

## 2018-07-26 MED ORDER — IBUPROFEN 600 MG PO TABS
600.0000 mg | ORAL_TABLET | Freq: Four times a day (QID) | ORAL | Status: DC
  Administered 2018-07-27 (×2): 600 mg via ORAL
  Filled 2018-07-26 (×3): qty 1

## 2018-07-26 MED ORDER — PRENATAL MULTIVITAMIN CH
1.0000 | ORAL_TABLET | Freq: Every day | ORAL | Status: DC
  Administered 2018-07-26 – 2018-07-27 (×2): 1 via ORAL
  Filled 2018-07-26 (×2): qty 1

## 2018-07-26 MED ORDER — COCONUT OIL OIL: 1.0000 "application " | TOPICAL_OIL | Status: DC | PRN

## 2018-07-26 MED ORDER — DIBUCAINE 1 % RE OINT: 1.0000 "application " | TOPICAL_OINTMENT | RECTAL | Status: DC | PRN

## 2018-07-26 MED ORDER — SIMETHICONE 80 MG PO CHEW: 80.0000 mg | CHEWABLE_TABLET | ORAL | Status: DC | PRN

## 2018-07-26 MED ORDER — WITCH HAZEL-GLYCERIN EX PADS: 1.0000 "application " | MEDICATED_PAD | CUTANEOUS | Status: DC | PRN

## 2018-07-26 MED ORDER — BENZOCAINE-MENTHOL 20-0.5 % EX AERO
1.0000 "application " | INHALATION_SPRAY | CUTANEOUS | Status: DC | PRN
  Filled 2018-07-26 (×2): qty 56

## 2018-07-26 MED ORDER — TETANUS-DIPHTH-ACELL PERTUSSIS 5-2.5-18.5 LF-MCG/0.5 IM SUSP: 0.5000 mL | Freq: Once | INTRAMUSCULAR | Status: DC

## 2018-07-26 MED ORDER — OXYCODONE HCL 5 MG PO TABS: 5.0000 mg | ORAL_TABLET | ORAL | Status: DC | PRN

## 2018-07-26 MED ORDER — ONDANSETRON HCL 4 MG/2ML IJ SOLN: 4.0000 mg | INTRAMUSCULAR | Status: DC | PRN

## 2018-07-26 MED ORDER — ONDANSETRON HCL 4 MG PO TABS: 4.0000 mg | ORAL_TABLET | ORAL | Status: DC | PRN

## 2018-07-26 MED ORDER — SENNOSIDES-DOCUSATE SODIUM 8.6-50 MG PO TABS
2.0000 | ORAL_TABLET | ORAL | Status: DC
  Administered 2018-07-26 – 2018-07-27 (×2): 2 via ORAL
  Filled 2018-07-26 (×3): qty 2

## 2018-07-26 MED ORDER — ACETAMINOPHEN 325 MG PO TABS
650.0000 mg | ORAL_TABLET | ORAL | Status: DC | PRN
  Administered 2018-07-26 – 2018-07-28 (×6): 650 mg via ORAL
  Filled 2018-07-26 (×7): qty 2

## 2018-07-26 MED ORDER — ZOLPIDEM TARTRATE 5 MG PO TABS: 5.0000 mg | ORAL_TABLET | Freq: Every evening | ORAL | Status: DC | PRN

## 2018-07-26 NOTE — Clinical Social Work Maternal (Signed)
CLINICAL SOCIAL WORK MATERNAL/CHILD NOTE  Patient Details  Name: Amanda [name] MRN: [id number] Date of Birth: 1980-10-05  Date:  07/26/2018  Clinical Social Worker Initiating Note:  Laurey Arrow Date/Time: Initiated:  07/27/18/1255     Child's Name:  Georga Kaufmann   Biological Parents:  Mother, Father   Need for Interpreter:  None   Reason for Referral:  Behavioral Health Concerns, Homelessness, Competency/Guardianship    Address:  [place] [address]    Phone number:  [phone] (home)     Additional phone number:   Household Members/Support Persons (HM/SP):   Household Member/Support Person 1   HM/SP Name Relationship DOB or Age  HM/SP -1 Legrand Como [name] FOB 03/22/1983  HM/SP -2        HM/SP -3        HM/SP -4        HM/SP -5        HM/SP -6        HM/SP -7        HM/SP -8          Natural Supports (not living in the home):  (MOB's family is a support however they live out of state.  FOB also reported that FOB's family is a support. )   Professional Supports: (MOB receives outpatient services at [employer] [facility] [place]. )   Employment: Disabled   Type of Work:     Education:  Programmer, systems   Homebound arranged:    Museum/gallery curator Resources:  [employer], Community education officer, [employer]    Other Resources:  (CSW provided family with information to apply for [employer] and Physicist, medical. )   Cultural/Religious Considerations Which May Impact Care:  None Reported  Strengths:  Ability to meet basic needs , Psychotropic Medications, Home prepared for child    Psychotropic Medications:         Pediatrician:       Pediatrician List:   Day      Pediatrician Fax Number:    Risk Factors/Current Problems:  Mental Health Concerns , Transportation , Basic Needs , Other (Comment)(Cognitive impairment. )   Cognitive State:  Able to  Concentrate , Alert , Linear Thinking    Mood/Affect:  Relaxed , Happy , Bright , Calm , Comfortable    CSW Assessment: CSW met with MOB in room 132 to complete an assessment for MH hx.  When CSW arrived, MOB was bonding with infant as evidence by engaging in skin to skin. MOB and infant appeared comfortable and MOB was attentive. FOB was also present and he was observing infant's and MOB's interactions. CSW explained CSW's role and MOB gave CSW permission to complete the assessment while FOB was present. Both parents were engaged in the assessment and was receptive to meeting with [place].   CSW asked about MOB's MH hx and MOB was able to recall her many dx. MOB reported that MOB discontinued all medications after pregnancy confirmation and has since being compliant with her Keppra. MOB did not demonstrate any acute MH symptoms during the assessment.   CSW provided education regarding the baby blues period vs. perinatal mood disorders, discussed treatment and gave resources for mental health follow up if concerns arise.  CSW recommends self-evaluation during the postpartum time period using the New Mom Checklist from Postpartum Progress and encouraged MOB to contact a medical  professional if symptoms are noted at any time.  CSW assessed for safety and MOB denied, SI, HI, AVH, and DV.  MOB is an established patient with [facility] and agreed to schedule a postnatal appointment within the next 2-3 weeks.   FOB reported being dx with Tourette syndrome and Asperger. FOB reported currently not being in treatment and acknowledged a need.  FOB agreed to contact [facility] [place] too. FOB communicated feeling anxious and overwhlemed with caring for infant and CSW observed FOB avoiding to make physical contact with infant while CSW was present.  CSW offered the family a referral to the Healthy Start Program for parenting assistance and FOB and MOB were receptive and  appreciative.   While CSW was assessing for supports FOB stated, "I lied, We don't stay with my grandmother anymore we stay at a local hotel on [address]." [place] thanked FOB for his honesty and processed the reason for FOB not to be honest initally.  FOB shared feelings of being afraid and having his child removed from his custody. CSW was understanding and communicated the need for CSW to make a report to [place].  MOB shared she was expecting a report to be made.  CSW encouraged the family to utilize CPS as a resource and view that agency as a support; the family agreed.   CSW provided education regarding SIDS and MOB and FOB asked appropriate questions and responded appropriately to CSW questions.   MOB and FOB reported having a good support team that consist of FOB's family locally and MOB's family out of state. The family reported having all essential items needed for infant and feeling prepared to parent.  The family identified transportation as a barrier and CSW encouraged the family to apply for Medicaid Transportation (information was provided to initiate the application).  CSW made CPS report to A. Norma Fredrickson.  CPS will follow-up with CSW if report is accepted by CPS. At this timet there are no barriers to discharge.  CSW Plan/Description:  No Further Intervention Required/No Barriers to Discharge, Sudden Infant Death Syndrome (SIDS) Education, Perinatal Mood and Anxiety Disorder (PMADs) Education, Other Patient/Family Education, Child Protective Service Report , Other Information/Referral to [employer], MSW, [employer] Clinical Social Work [phone]  Dimple Nanas, [place] 07/26/2018, 2:10 PM

## 2018-07-26 NOTE — Anesthesia Postprocedure Evaluation (Signed)
Anesthesia Post Note  Patient: Amanda [name]  Procedure(s) Performed: AN AD [place]     Patient location during evaluation: Mother Baby Anesthesia Type: Epidural Level of consciousness: awake and alert Pain management: pain level controlled Vital Signs Assessment: post-procedure vital signs reviewed and stable Respiratory status: spontaneous breathing, nonlabored ventilation and respiratory function stable Cardiovascular status: stable Postop Assessment: no headache, no backache, epidural receding, able to ambulate, adequate PO intake, no apparent nausea or vomiting and patient able to bend at knees Anesthetic complications: no    Last Vitals:  Vitals:   07/26/18 0316 07/26/18 0437  BP: 116/68 126/79  Pulse: 84 80  Resp: 16 18  Temp: 37.1 C 36.8 C  SpO2: 100% 96%    Last Pain:  Vitals:   07/26/18 0437  TempSrc: Oral  PainSc: 0-No pain   Pain Goal:                 [employer]

## 2018-07-26 NOTE — Lactation Note (Signed)
This note was copied from a baby's chart. Lactation Consultation Note  Patient Name: Amanda [name] HFWYO'V Date: 07/26/2018 Reason for consult: Initial assessment;1st time breastfeeding;Primapara;Early term 72-38.6wks  3 hours old early term female who is being exclusively BF by his mother, she's a P1. Mom is currently homeless, she's staying at a hotel with FOB. She has a history of extensive mental health issues per MD note, but she's still compliant with Keppra which is an L2.   LC showed mom how to hand express, but no colostrum was observed yet. Mom doesn't have a pump, LC offered one from the hospital, pump instructions, cleaning and storage were reviewed, as well as milk storage guidelines.  Offered assistance with latch but mom politely declined stating baby is not ready to feed, he just fed. Once LC came back to the room with the hand pump, noticed that baby was crying, offered assistance with latch on a second time, but mom would not respond to [facility], she would just say "Come on baby, mommy is eating baby" and did not feed baby. FOB tried to calm baby down and held him, baby stopped crying. Asked mom again to call for assistance when needed.  Feeding plan:  1. Encouraged mom to feed baby STS 8-12 times/24 hours or sooner if feeding cues are present 2. Hand expression and spoon feeding was also encouraged  BF brochure, BF resources and feeding diary were reviewed. Parents reported all questions and concerns were answered, they're both aware of [place] services and will call PRN.  Maternal Data Formula Feeding for Exclusion: No Has patient been taught Hand Expression?: Yes Does the patient have breastfeeding experience prior to this delivery?: No  Feeding Feeding Type: Breast Fed  Interventions Interventions: Breast feeding basics reviewed;Breast massage;Hand express;Breast compression;Hand pump  Lactation Tools Discussed/Used Tools: Pump Breast pump type: Manual WIC Program:  No Pump Review: Setup, frequency, and cleaning;Milk Storage Initiated by:: MPeck Date initiated:: 07/26/18   Consult Status Consult Status: Follow-up Date: 07/27/18 Follow-up type: In-patient    Rosco Harriott Francene Boyers 07/26/2018, 3:49 PM

## 2018-07-27 NOTE — Lactation Note (Addendum)
This note was copied from a baby's chart. Lactation Consultation Note  Patient Name: Amanda [name] KMMNO'T Date: 07/27/2018  52 hour old ETI vacuum assist.  Infant with bruising on hand. When walked in room mom reports he just ate.. Discussed with parents initiating pumping to help him possibly get more milk faster and hopefully reduce his risk for jaundice by feeding back any breastmilk mom able to pump or hand express. Mom reports she has not been able to hand express any milk yet .Mom reports he has not been feeding on right breast/can't really get him to latch on right/only left breast. But mom reports bf well on left breast.    Asked mom if I could try hand expression, mom agreed.  Assist with hand expression.  Able to get small drops of colosotrum. inittiated puimping with mom. Mom got small drops of colostrum inside flange.  Encouraged mom to take her finger and get the drops and feed to infant.Infant cuing.  Reviewed hunger cues with parents.  Showed mom he was trying to eat her finger and blanket and she needed to feed him.  Minimal assist with latch in cross cradle hold. Mom reports latch is comfortable.Infant latched and breastfed well with some swallows seen and heard. Urged mom to follow up with lactation  As needed.  Feed on cue and 8 or more feeds in 24 hours.  Urged mom to keep practicing hand expression and lots of breast massage and feed him dessert past bf.  Left mom and baby breastfeeding   Maternal Data    Feeding Feeding Type: Breast Fed  LATCH Score Latch: Grasps breast easily, tongue down, lips flanged, rhythmical sucking.  Audible Swallowing: A few with stimulation  Type of Nipple: Everted at rest and after stimulation  Comfort (Breast/Nipple): Soft / non-tender  Hold (Positioning): Assistance needed to correctly position infant at breast and maintain latch.  LATCH Score: 8  Interventions Interventions: Breast massage;Adjust position;Support pillows;Skin to  skin;Assisted with latch  Lactation Tools Discussed/Used     Consult Status      Amanda [name] 07/27/2018, 5:32 PM

## 2018-07-27 NOTE — Progress Notes (Signed)
CSW received a telephone call from intake CPS worker Jasper Riling.  CPS reported that CSW report was screened out and CPS will not initiate an investigation.  CSW will update bedside nurse and will make another report if warranted.   Laurey Arrow, MSW, LCSW Clinical Social Work [phone]

## 2018-07-27 NOTE — Progress Notes (Signed)
Post Partum Day 1 Subjective: no complaints, up ad lib, voiding and tolerating PO, small lochia, plans to breastfeed, IUD  Objective: Blood pressure 107/68, pulse 60, temperature 97.6 F (36.4 C), temperature source Oral, resp. rate 16, height 5\' 6"  (1.676 m), weight 95.6 kg, last menstrual period 11/07/2017, SpO2 99 %, unknown if currently breastfeeding.  Physical Exam:  General: alert, cooperative and no distress Lochia:normal flow Chest: CTAB Heart: RRR no m/r/g Abdomen: +BS, soft, nontender,  Uterine Fundus: firm DVT Evaluation: No evidence of DVT seen on physical exam. Extremities: no edema  BPs normal since delivery  Recent Labs    07/25/18 0509 07/26/18 0355  HGB 12.3 10.8*  HCT 36.8 32.3*    Assessment/Plan: Plan for discharge tomorrow   LOS: 3 days   Amanda [name] 07/27/2018, 8:04 AM

## 2018-07-28 ENCOUNTER — Telehealth: Payer: Self-pay | Admitting: Obstetrics & Gynecology

## 2018-07-28 MED ORDER — ACETAMINOPHEN 325 MG PO TABS: 650.0000 mg | ORAL_TABLET | ORAL | 0 refills | Status: DC | PRN

## 2018-07-28 MED ORDER — SENNOSIDES-DOCUSATE SODIUM 8.6-50 MG PO TABS: 2.0000 | ORAL_TABLET | ORAL | 0 refills | Status: AC

## 2018-07-28 MED ORDER — ACETAMINOPHEN 500 MG PO TABS: 1000.0000 mg | ORAL_TABLET | Freq: Four times a day (QID) | ORAL | 2 refills | Status: DC | PRN

## 2018-07-28 NOTE — Discharge Summary (Addendum)
Postpartum Discharge Summary     Patient Name: Amanda [name] DOB: 1980/09/20 MRN: [id number]  Date of admission: 07/24/2018 Delivering Provider: CONSTANT, PEGGY   Date of discharge: 07/28/2018  Admitting diagnosis: INDUCTION Intrauterine pregnancy: [redacted]w[redacted]d     Secondary diagnosis:  Principal Problem:   Encounter for induction of labor Active Problems:   Schizoaffective disorder, depressive type ([place])   Seizure disorder ([place])   Supervision of high risk pregnancy, antepartum   Seizure disorder during pregnancy ([place])   Advanced maternal age, primigravida   Bipolar 2 disorder ([place])   Borderline personality disorder ([place])   PTSD (post-traumatic stress disorder)   OCD (obsessive compulsive disorder)   Asthma   Gestational hypertension   Postpartum care following vaginal delivery  Additional problems: None     Discharge diagnosis: Term Pregnancy Delivered, Gestational Hypertension and Vacuum Delivery                                                                                                Post partum procedures:None  Augmentation: AROM, Pitocin, Cytotec and Foley Balloon  Complications: None  Hospital course:  Induction of Labor With Vaginal Delivery   37 y.o. yo G2P1011 at [redacted]w[redacted]d was admitted to the hospital 07/24/2018 for induction of labor.  Indication for induction: Gestational hypertension, AMA and seizure disorder with other complex medical history.  Patient had an uncomplicated labor course as follows: Membrane Rupture Time/Date: 1:32 PM ,07/25/2018   Intrapartum Procedures: Episiotomy: None [1]                                         Lacerations:  2nd degree [3];Perineal [11]  Patient had delivery of a Viable infant with vacuum assistance on 07/26/2018.  Information for the patient's newborn:  Anwitha, Mapes [[id number]]  Delivery Method: Vag-Spont   Details of delivery can be found in separate delivery note.  Patient had a routine postpartum course; BP remained  stable. Patient is discharged home 07/28/18.  Physical exam  Vitals:   07/27/18 0500 07/27/18 1359 07/27/18 2045 07/28/18 0532  BP: 107/68 121/78 125/74 110/64  Pulse: 60 (!) 59 68 63  Resp: 16 18 18 16   Temp: 97.6 F (36.4 C) 97.8 F (36.6 C) 98 F (36.7 C) 97.7 F (36.5 C)  TempSrc: Oral Oral Oral Oral  SpO2:  100%    Weight:      Height:       General: alert, cooperative and no distress Lochia: appropriate Uterine Fundus: firm Incision: N/A DVT Evaluation: No evidence of DVT seen on physical exam. Negative Homan's sign. No cords or calf tenderness. No significant calf/ankle edema.  Labs: Lab Results  Component Value Date   WBC 29.2 (H) 07/26/2018   HGB 10.8 (L) 07/26/2018   HCT 32.3 (L) 07/26/2018   MCV 92.0 07/26/2018   PLT 283 07/26/2018   CMP Latest Ref Rng & Units 07/11/2018  Glucose 65 - 99 mg/dL 107(H)  BUN 6 - 20 mg/dL 4(L)  Creatinine 0.57 - 1.00 mg/dL  0.70  Sodium 134 - 144 mmol/L 136  Potassium 3.5 - 5.2 mmol/L 3.6  Chloride 96 - 106 mmol/L 103  CO2 20 - 29 mmol/L 17(L)  Calcium 8.7 - 10.2 mg/dL 9.4  Total Protein 6.0 - 8.5 g/dL 6.0  Total Bilirubin 0.0 - 1.2 mg/dL 0.3  Alkaline Phos 39 - 117 IU/L 142(H)  AST 0 - 40 IU/L 10  ALT 0 - 32 IU/L 9    Discharge instruction: per After Visit Summary and "Baby and Me Booklet".  After visit meds:  Allergies as of 07/28/2018      Reactions   Aspartame And Phenylalanine Anaphylaxis, Hives, Diarrhea   Benadryl [diphenhydramine] Anaphylaxis, Diarrhea, Other (See Comments)   Blisters also   Other Anaphylaxis, Nausea And Vomiting, Rash, Other (See Comments)   Aspartame- Blisters, also Dust- Worsens asthma Ragweed- Worsens asthma, face gets red, and sneezing Animal Fur/Dander- Worsens asthma and sneezing   Scallops [shellfish Allergy] Anaphylaxis, Diarrhea, Nausea And Vomiting   Yellow Jacket Venom [bee Venom] Anaphylaxis, Diarrhea, Nausea And Vomiting   Seizures and numbness   Pollen Extract Other (See  Comments)   Runny nose, eyes, and asthma worsens   Tegretol [carbamazepine] Hives, Diarrhea, Other (See Comments)   Blisters in mouth and increase in seizures   Adhesive [tape] Rash   Latex Hives, Rash   Blisters, also- Condoms and dental encounters      Medication List    STOP taking these medications   penicillin v potassium 500 MG tablet Commonly known as:  VEETID     TAKE these medications   acetaminophen 500 MG tablet Commonly known as:  TYLENOL Take 2 tablets (1,000 mg total) by mouth every 6 (six) hours as needed for moderate pain or headache (for pain scale < 4).   butalbital-acetaminophen-caffeine 50-325-40 MG tablet Commonly known as:  FIORICET, ESGIC Take 2 tablets by mouth every 6 (six) hours as needed for headache.   EPINEPHrine 0.3 mg/0.3 mL Soaj injection Commonly known as:  EPI-PEN Inject 0.3 mg into the muscle as needed (for allergic reaction).   labetalol 200 MG tablet Commonly known as:  NORMODYNE Take 1 tablet (200 mg total) by mouth 3 (three) times daily.   levETIRAcetam 500 MG tablet Commonly known as:  KEPPRA Take 1 tablet (500 mg total) by mouth 2 (two) times daily.   Prenatal Vitamins 0.8 MG tablet Take 1 tablet by mouth daily.   senna-docusate 8.6-50 MG tablet Commonly known as:  Senokot-S Take 2 tablets by mouth daily for 7 days. Start taking on:  07/29/2018       Diet: routine diet  Activity: Advance as tolerated. Pelvic rest for 6 weeks.   Outpatient follow up details: Please schedule this patient for Postpartum visit in: 1 week with the following provider: BP check For C/S patients schedule nurse incision check in weeks 2 weeks: no High risk pregnancy complicated by: gHTN, seizure disorder, schizoaffective disorder Delivery mode:  Vacuum Anticipated Birth Control:  IUD PP Procedures needed: None  Schedule Integrated BH visit: yes  Newborn Data: Live born female  Birth Weight: 6 lb 8.9 oz (2974 g) APGAR: 7, 9  Newborn  Delivery   Birth date/time:  07/26/2018 01:13:00 Delivery type:  Vaginal, Vacuum (Extractor)     Baby Feeding: Breast Disposition:home with mother   07/28/2018 Verita Schneiders, MD

## 2018-07-28 NOTE — Discharge Instructions (Signed)
Vaginal Delivery, Care After °Refer to this sheet in the next few weeks. These instructions provide you with information about caring for yourself after vaginal delivery. Your health care provider may also give you more specific instructions. Your treatment has been planned according to current medical practices, but problems sometimes occur. Call your health care provider if you have any problems or questions. °What can I expect after the procedure? °After vaginal delivery, it is common to have: °· Some bleeding from your vagina. °· Soreness in your abdomen, your vagina, and the area of skin between your vaginal opening and your anus (perineum). °· Pelvic cramps. °· Fatigue. ° °Follow these instructions at home: °Medicines °· Take over-the-counter and prescription medicines only as told by your health care provider. °· If you were prescribed an antibiotic medicine, take it as told by your health care provider. Do not stop taking the antibiotic until it is finished. °Driving ° °· Do not drive or operate heavy machinery while taking prescription pain medicine. °· Do not drive for 24 hours if you received a sedative. °Lifestyle °· Do not drink alcohol. This is especially important if you are breastfeeding or taking medicine to relieve pain. °· Do not use tobacco products, including cigarettes, chewing tobacco, or e-cigarettes. If you need help quitting, ask your health care provider. °Eating and drinking °· Drink at least 8 eight-ounce glasses of water every day unless you are told not to by your health care provider. If you choose to breastfeed your baby, you may need to drink more water than this. °· Eat high-fiber foods every day. These foods may help prevent or relieve constipation. High-fiber foods include: °? Whole grain cereals and breads. °? Brown rice. °? Beans. °? Fresh fruits and vegetables. °Activity °· Return to your normal activities as told by your health care provider. Ask your health care provider  what activities are safe for you. °· Rest as much as possible. Try to rest or take a nap when your baby is sleeping. °· Do not lift anything that is heavier than your baby or 10 lb (4.5 kg) until your health care provider says that it is safe. °· Talk with your health care provider about when you can engage in sexual activity. This may depend on your: °? Risk of infection. °? Rate of healing. °? Comfort and desire to engage in sexual activity. °Vaginal Care °· If you have an episiotomy or a vaginal tear, check the area every day for signs of infection. Check for: °? More redness, swelling, or pain. °? More fluid or blood. °? Warmth. °? Pus or a bad smell. °· Do not use tampons or douches until your health care provider says this is safe. °· Watch for any blood clots that may pass from your vagina. These may look like clumps of dark red, brown, or black discharge. °General instructions °· Keep your perineum clean and dry as told by your health care provider. °· Wear loose, comfortable clothing. °· Wipe from front to back when you use the toilet. °· Ask your health care provider if you can shower or take a bath. If you had an episiotomy or a perineal tear during labor and delivery, your health care provider may tell you not to take baths for a certain length of time. °· Wear a bra that supports your breasts and fits you well. °· If possible, have someone help you with household activities and help care for your baby for at least a few days after   you leave the hospital. °· Keep all follow-up visits for you and your baby as told by your health care provider. This is important. °Contact a health care provider if: °· You have: °? Vaginal discharge that has a bad smell. °? Difficulty urinating. °? Pain when urinating. °? A sudden increase or decrease in the frequency of your bowel movements. °? More redness, swelling, or pain around your episiotomy or vaginal tear. °? More fluid or blood coming from your episiotomy or  vaginal tear. °? Pus or a bad smell coming from your episiotomy or vaginal tear. °? A fever. °? A rash. °? Little or no interest in activities you used to enjoy. °? Questions about caring for yourself or your baby. °· Your episiotomy or vaginal tear feels warm to the touch. °· Your episiotomy or vaginal tear is separating or does not appear to be healing. °· Your breasts are painful, hard, or turn red. °· You feel unusually sad or worried. °· You feel nauseous or you vomit. °· You pass large blood clots from your vagina. If you pass a blood clot from your vagina, save it to show to your health care provider. Do not flush blood clots down the toilet without having your health care provider look at them. °· You urinate more than usual. °· You are dizzy or light-headed. °· You have not breastfed at all and you have not had a menstrual period for 12 weeks after delivery. °· You have stopped breastfeeding and you have not had a menstrual period for 12 weeks after you stopped breastfeeding. °Get help right away if: °· You have: °? Pain that does not go away or does not get better with medicine. °? Chest pain. °? Difficulty breathing. °? Blurred vision or spots in your vision. °? Thoughts about hurting yourself or your baby. °· You develop pain in your abdomen or in one of your legs. °· You develop a severe headache. °· You faint. °· You bleed from your vagina so much that you fill two sanitary pads in one hour. °This information is not intended to replace advice given to you by your health care provider. Make sure you discuss any questions you have with your health care provider. °Document Released: 08/03/2000 Document Revised: 01/18/2016 Document Reviewed: 08/21/2015 °Elsevier Interactive Patient Education © 2018 Elsevier Inc. ° °

## 2018-07-28 NOTE — Lactation Note (Signed)
This note was copied from a baby's chart. Lactation Consultation Note  Patient Name: Amanda [name] VFMBB'U Date: 07/28/2018 Reason for consult: Follow-up assessment;Infant weight loss;Early term 17-38.6wks Baby is 10 hours old and at a 9.2% weight loss.  Mom feels baby is breastfeeding well.  Baby is now receiving phototherapy.  Mom has started supplementing with formula due to weight loss.  She also recently pumped 10 mls.  Instructed to breastfeed baby with cues and supplement with 15-30 mls of expressed milk/formula every 3 hours.  Mom will also post pump every 3 hours.  Encouraged to call out for assist/concerns prn.  Maternal Data    Feeding    LATCH Score                   Interventions    Lactation Tools Discussed/Used     Consult Status Consult Status: Follow-up Date: 07/29/18 Follow-up type: In-patient    Ave Filter 07/28/2018, 11:59 AM

## 2018-07-28 NOTE — Lactation Note (Signed)
This note was copied from a baby's chart. Lactation Consultation Note  Patient Name: Amanda [name] FVCBS'W Date: 07/28/2018 Reason for consult: 1st time breastfeeding;Early term 37-38.6wks P1, 47 hour female infant.  BF concerns: bruising  on head, at risk for jaundice, mom has short shaft nipples.  LC entered room mom was doing STS. Per mom, earlier she was having trouble latching infant to breast. Infant became fussy and mom gave infant 60ml of colostrum. Lab Tech entered room for blood draw (heel stick). Mom did breast stimulation and express a little colostrum prior to latching infant to breast. Mom latched infant on left breast using cross cradle hold, infant had wide mouth gape and swallows heard with gulping at breast. Infant BF for 15 minutes and was still BF as LC left the room. Per mom, she has not been using DEBP. LC reinforced importance of pumping and give infant back EBM.  BF plans: 1. Mom will BF according hunger cues and not exceed 3 hours without BF infant. 2. After latching infant to breast , mom will give infant back EBM by hand expression or using DEBP. 3. Mom will continue to do as much STS as possible. Maternal Data Has patient been taught Hand Expression?: Yes(Mom hand expressed 2 ml colostrum that was spoon feed to infant.)  Feeding Feeding Type: Breast Fed  LATCH Score Latch: Grasps breast easily, tongue down, lips flanged, rhythmical sucking.  Audible Swallowing: Spontaneous and intermittent  Type of Nipple: Everted at rest and after stimulation  Comfort (Breast/Nipple): Soft / non-tender  Hold (Positioning): Assistance needed to correctly position infant at breast and maintain latch.  LATCH Score: 9  Interventions Interventions: Skin to skin;Assisted with latch;Adjust position;Support pillows;Hand express  Lactation Tools Discussed/Used     Consult Status Consult Status: Follow-up Date: 07/29/18 Follow-up type: In-patient    [name] 07/28/2018, 1:14 AM

## 2018-07-28 NOTE — Telephone Encounter (Signed)
Called and left a VM for her to call the office about follower-up appointments.

## 2018-07-29 ENCOUNTER — Ambulatory Visit: Payer: Self-pay

## 2018-07-29 NOTE — Lactation Note (Signed)
This note was copied from a baby's chart. Lactation Consultation Note  Patient Name: Amanda [name] BWIOM'B Date: 07/29/2018 Reason for consult: Follow-up assessment;Infant weight loss;Hyperbilirubinemia;Other (Comment);Early term 37-38.6wks(off Bili lights this am )  Baby is 28 hours old  Mom, baby and dad getting ready for D/C.  As LC entered the room, mom finishing up the feeding if 30 ml of formula.  Mentioned the last time to the breast was at 8 am for 10 mins.  LC reviewed basics - importance of STS feedings until the baby  is back to  Birth weight gaining steadily, a can stay awake for feeding.  LC recommended since the baby has had several bottles , if trying to latch And baby is to fussy, give 10 ml of formula and then latch. Until the milk comes  In prior to latch - breast massage , hand express, pre-pump to prime milk ducts.  Sore nipple and engorgement prevention and tx .  Mom has a hand pump, and plans to sign up with WIC. LC stressed the importance of consistent feedings, with feeding cues and by 3 hours.  8-10 times a day.  Mother informed of post-discharge support and given phone number to the lactation department, including services for phone call assistance; out-patient appointments; and breastfeeding support group. List of other breastfeeding resources in the community given in the handout. Encouraged mother to call for problems or concerns related to breastfeeding.    Maternal Data    Feeding Feeding Type: Bottle Fed - Formula Nipple Type: Slow - flow  LATCH Score Latch: (per mom last to the breast at 8 am for 10 mins . recent feeding formula )                 Interventions Interventions: Breast feeding basics reviewed;Hand pump  Lactation Tools Discussed/Used Tools: Pump Breast pump type: Manual;Double-Electric Breast Pump Pump Review: Milk Storage   Consult Status Consult Status: Complete Date: 07/29/18    Myer Haff 07/29/2018, 12:42 PM

## 2018-07-31 NOTE — BH Specialist Note (Signed)
Integrated Behavioral Health Follow Up Visit  MRN: [id number] Name: Amanda [name]  Number of [facility] Clinician visits: 7 total; 1 previous less than 15 minutes Session Start time: 10:15  Session End time: 11:20 Total time: 1 hour  Type of Service: [place] Interpretor:No. Interpretor Name and Language: n/a  SUBJECTIVE: [name] is a 37 y.o. female accompanied by Partner/Significant Other Patient was referred by self today, for processing feelings after latest court date regarding child in CPS custody. Patient reports the following symptoms/concerns: Pt states her primary concern today is feeling sad and angry that the social worker at her latest court date said that no family members wanted to take her son; pt self-reports that several family members, including an aunt and a cousin, have both offered to care for her son. Pt has not gone back to [facility], but plans to call to schedule an upcoming appointment this afternoon.  Duration of problem: Postpartum; Severity of problem: moderate  OBJECTIVE: Mood: Anxious and Irritable and Affect: Appropriate Risk of harm to self or others: No plan to harm self or others  LIFE CONTEXT: Family and Social: Pt lives with FOB; baby in [place] custody School/Work: FOB works Self-Care: - Life Changes: Recent childbirth, and baby in [place] custody over lack of appropriate housing  GOALS ADDRESSED: Patient will: 1.  Reduce symptoms of: stress  2.  Demonstrate ability to: Increase motivation to adhere to plan of care  INTERVENTIONS: Interventions utilized:  Supportive Counseling Standardized Assessments completed: GAD-7 and PHQ 9  ASSESSMENT: Patient currently experiencing Psychosocial stress today  Patient may benefit from supportive counseling today. Marland Kitchen  PLAN: 1. Follow up with behavioral health clinician on : Two weeks via phone check 2. Behavioral recommendations:  -Call [facility] today; if unable to receive a quick appointment, use walk-in clinic on August 11, 2018 (Monday), to re-establish care 3. Referral(s): [facility] (In Clinic) and [facility] (LME/Outside Clinic) 4. "From scale of 1-10, how likely are you to follow plan?": [place], LCSW

## 2018-08-04 ENCOUNTER — Ambulatory Visit (INDEPENDENT_AMBULATORY_CARE_PROVIDER_SITE_OTHER): Payer: Self-pay | Admitting: Clinical

## 2018-08-04 ENCOUNTER — Ambulatory Visit (INDEPENDENT_AMBULATORY_CARE_PROVIDER_SITE_OTHER): Payer: Medicare (Managed Care) | Admitting: General Practice

## 2018-08-04 VITALS — BP 127/83 | HR 79 | Ht 67.0 in | Wt 191.0 lb

## 2018-08-04 DIAGNOSIS — Z0130 Encounter for examination of blood pressure without abnormal findings: Secondary | ICD-10-CM

## 2018-08-04 DIAGNOSIS — Z658 Other specified problems related to psychosocial circumstances: Secondary | ICD-10-CM

## 2018-08-04 NOTE — BH Specialist Note (Signed)
Integrated Behavioral Health Follow Up Visit  MRN: [id number] Name: Amanda [name]  Number of [facility] Clinician visits: 7 total (1 visit of 7 was less than 15 min) Session Start time: 2:10  Session End time: 2:51 Total time: 40 minutes  Type of Service: [place] Interpretor:No. Interpretor Name and Language: n/a  SUBJECTIVE: Amanda [name] is a 37 y.o. female accompanied by Partner/Significant Other Patient was referred by self today for current life stress. Patient reports the following symptoms/concerns: Pt states her primary concern today is feeling distraught over not having her son with her since Thursday; her son is in [place] custody, due to motel room not having a working refrigerator or microwave (to store and heat pumped milk). Pt says that the motel was supposed to switch her to a room with above all in working order, while she was in the hospital. Pt is calling [facility] this afternoon to set up next appointments for herself and FOB, as they both plan to resume BH medications. Pt wants to talk to psychiatrist, and make sure it's safe for baby before taking, as her goal is to have a healthy baby.   Pt wants to cooperate with CPS, and meet all obligations to bring her son home; until he is home, she wants to be able to continue pumping milk for him, and will request to be allowed to call him nightly to read to him.  Duration of problem: Ongoing; Severity of problem: moderate  OBJECTIVE: Mood: Anxious and Affect: Appropriate Risk of harm to self or others: No plan to harm self or others  LIFE CONTEXT: Family and Social: Pt lives with FOB School/Work: FOB works part-time Self-Care: Pt is keeping a positive outlook Life Changes: Recent childbirth; baby in [place] custody since Thursday (4 days)  GOALS ADDRESSED: Patient will: 1.  Reduce symptoms of: stress  2.  Increase knowledge and/or ability of: healthy habits  3.   Demonstrate ability to: Increase healthy adjustment to current life circumstances  INTERVENTIONS: Interventions utilized:  Supportive Counseling Standardized Assessments completed: Not Needed  ASSESSMENT: Patient currently experiencing Psychosocial stress.   Patient may benefit from supportive counseling today.  PLAN: 1. Follow up with behavioral health clinician on : As needed 2. Behavioral recommendations:  -Call Family Services today, to set up next appointment, for ongoing therapy and to resume [place] med management -Prioritize healthy sleep tonight, prior to court date tomorrow morning -Continue taking prenatal vitamins until postpartum visit; discuss with medical provider at that time -Continue pumping and freezing breast milk for baby -Continue working with housing agencies to find more stable housing 3. Referral(s): [facility] (In Clinic) 4. "From scale of 1-10, how likely are you to follow plan?": -  Garlan Fair, LCSW  Depression screen [facility] 2/9 07/21/2018 07/16/2018 07/11/2018 07/04/2018 06/05/2018  Decreased Interest 0 0 0 0 0  Down, Depressed, Hopeless 0 0 1 0 0  PHQ - 2 Score 0 0 1 0 0  Altered sleeping 0 0 0 1 0  Tired, decreased energy 1 0 0 1 0  Change in appetite 0 0 0 0 0  Feeling bad or failure about yourself  0 0 0 0 0  Trouble concentrating 0 0 0 0 0  Moving slowly or fidgety/restless 0 0 0 0 0  Suicidal thoughts 0 0 0 0 0  PHQ-9 Score 1 0 1 2 0  Difficult doing work/chores - - - - -  Some recent data might be  hidden   GAD 7 : Generalized Anxiety Score 07/21/2018 07/16/2018 07/11/2018 07/04/2018  Nervous, Anxious, on Edge 1 0 0 0  Control/stop worrying 0 0 1 0  Worry too much - different things 1 0 1 0  Trouble relaxing 1 0 0 0  Restless 0 0 0 0  Easily annoyed or irritable 1 0 1 0  Afraid - awful might happen 0 0 1 0  Total GAD 7 Score 4 0 4 0  Anxiety Difficulty - - - -

## 2018-08-04 NOTE — Progress Notes (Signed)
Patient presents to office today for BP check- delivered vaginally on 12/7. Patient reports taking labetalol daily. Patient denies headaches, dizziness or blurry vision. BP is WNL today. Discussed with Dr Rosana Hoes who states patient should continue medication and follow up at pp visit.  Told patient to continue BP medication and to follow up on 1/13 for pp visit or sooner if she becomes symptomatic. Patient verbalized understanding & had no questions.  Koren Bound RN BSN 08/04/18

## 2018-08-06 NOTE — Progress Notes (Signed)
I have reviewed this chart and agree with the RN/CMA assessment and management.    K. Meryl Iceis Knab, M.D. Attending [facility] (Faculty Practice)   

## 2018-08-07 ENCOUNTER — Encounter (HOSPITAL_COMMUNITY): Payer: Self-pay | Admitting: Emergency Medicine

## 2018-08-07 ENCOUNTER — Emergency Department (HOSPITAL_COMMUNITY)
Admission: EM | Admit: 2018-08-07 | Discharge: 2018-08-07 | Disposition: A | Discharge status: Home / Self Care | Payer: Medicare (Managed Care) | Attending: Emergency Medicine | Admitting: Emergency Medicine

## 2018-08-07 DIAGNOSIS — F330 Major depressive disorder, recurrent, mild: Secondary | ICD-10-CM | POA: Insufficient documentation

## 2018-08-07 DIAGNOSIS — J45909 Unspecified asthma, uncomplicated: Secondary | ICD-10-CM | POA: Diagnosis not present

## 2018-08-07 DIAGNOSIS — Z79899 Other long term (current) drug therapy: Secondary | ICD-10-CM | POA: Insufficient documentation

## 2018-08-07 DIAGNOSIS — F4312 Post-traumatic stress disorder, chronic: Secondary | ICD-10-CM | POA: Insufficient documentation

## 2018-08-07 DIAGNOSIS — F329 Major depressive disorder, single episode, unspecified: Secondary | ICD-10-CM

## 2018-08-07 DIAGNOSIS — F17210 Nicotine dependence, cigarettes, uncomplicated: Secondary | ICD-10-CM | POA: Diagnosis not present

## 2018-08-07 DIAGNOSIS — F319 Bipolar disorder, unspecified: Secondary | ICD-10-CM | POA: Insufficient documentation

## 2018-08-07 LAB — RAPID URINE DRUG SCREEN, HOSP PERFORMED
Amphetamines: NOT DETECTED
Barbiturates: NOT DETECTED
Benzodiazepines: NOT DETECTED
Cocaine: NOT DETECTED
Opiates: NOT DETECTED
Tetrahydrocannabinol: NOT DETECTED

## 2018-08-07 LAB — CBC WITH DIFFERENTIAL/PLATELET
Abs Immature Granulocytes: 0.2 10*3/uL — ABNORMAL HIGH (ref 0.00–0.07)
BASOS ABS: 0.1 10*3/uL (ref 0.0–0.1)
Basophils Relative: 1 %
EOS PCT: 1 %
Eosinophils Absolute: 0.2 10*3/uL (ref 0.0–0.5)
HCT: 39.2 % (ref 36.0–46.0)
Hemoglobin: 12.6 g/dL (ref 12.0–15.0)
Immature Granulocytes: 1 %
Lymphocytes Relative: 21 %
Lymphs Abs: 3.6 10*3/uL (ref 0.7–4.0)
MCH: 29.9 pg (ref 26.0–34.0)
MCHC: 32.1 g/dL (ref 30.0–36.0)
MCV: 93.1 fL (ref 80.0–100.0)
Monocytes Absolute: 0.9 10*3/uL (ref 0.1–1.0)
Monocytes Relative: 5 %
NRBC: 0 % (ref 0.0–0.2)
Neutro Abs: 12.6 10*3/uL — ABNORMAL HIGH (ref 1.7–7.7)
Neutrophils Relative %: 71 %
Platelets: 546 10*3/uL — ABNORMAL HIGH (ref 150–400)
RBC: 4.21 MIL/uL (ref 3.87–5.11)
RDW: 13 % (ref 11.5–15.5)
WBC: 17.7 10*3/uL — ABNORMAL HIGH (ref 4.0–10.5)

## 2018-08-07 LAB — SALICYLATE LEVEL

## 2018-08-07 LAB — COMPREHENSIVE METABOLIC PANEL
ALT: 11 U/L (ref 0–44)
AST: 15 U/L (ref 15–41)
Albumin: 3.4 g/dL — ABNORMAL LOW (ref 3.5–5.0)
Alkaline Phosphatase: 99 U/L (ref 38–126)
Anion gap: 12 (ref 5–15)
BUN: 15 mg/dL (ref 6–20)
CO2: 21 mmol/L — ABNORMAL LOW (ref 22–32)
CREATININE: 0.66 mg/dL (ref 0.44–1.00)
Calcium: 9.1 mg/dL (ref 8.9–10.3)
Chloride: 107 mmol/L (ref 98–111)
Glucose, Bld: 80 mg/dL (ref 70–99)
Potassium: 3.9 mmol/L (ref 3.5–5.1)
Sodium: 140 mmol/L (ref 135–145)
Total Bilirubin: 0.5 mg/dL (ref 0.3–1.2)
Total Protein: 6.8 g/dL (ref 6.5–8.1)

## 2018-08-07 LAB — ETHANOL: Alcohol, Ethyl (B): <10 mg/dL (ref <10)

## 2018-08-07 LAB — ACETAMINOPHEN LEVEL: Acetaminophen (Tylenol), Serum: <10 ug/mL — ABNORMAL LOW (ref 10–30)

## 2018-08-07 NOTE — BH Assessment (Signed)
[facility] Assessment Progress Note  Per Jinny Blossom, FNP, this pt does not require psychiatric hospitalization at this time.  Pt is to be discharged from [facility] with recommendation to follow up with [facility] [place].  Pt has also requested contact information for [facility].  These have been included in pt's discharge instructions.  Pt's nurse, Nena Jordan, has been notified.  Jalene Mullet, [place] Triage Specialist [phone]

## 2018-08-07 NOTE — BH Assessment (Signed)
Assessment Note  Amanda [name] is an 37 y.o. female. Pt denies SI/HI and AVH. Per Pt she is depressed about her recent newborn being taken by DSS due to housing issues. The Pt has also been depressed about not having her psychiatric medication for 9 months. The Pt states she is ready to begin her psychiatric medication again. Per Pt she also needs assistance with getting Medicaid and assistance with housing.  Margarita Grizzle, NP recommends D/C and follow-up with [facility] the [place].  Pt also provided contact information for DSS.  Diagnosis:  F33.0 MDD  Past Medical History:  Past Medical History:  Diagnosis Date  . Anxiety   . Asthma   . Asthma due to environmental allergies   . Asthma due to seasonal allergies   . Bipolar 1 disorder ([place])   . Borderline personality disorder ([place])   . Brain bleed ([place])   . Chronic post-traumatic stress disorder (PTSD)    complex chronic with psychotic features and self harm behaviors  . Dander (animal) allergy   . Hearing loss    right ear  . Heart murmur   . Hip dysplasia   . Major depression, chronic   . Mood disorder ([place])   . OCD (obsessive compulsive disorder)   . RA (rheumatoid arthritis) ([place])   . Rheumatoid arthritis ([place])   . Seizure disorder ([place])   . Suicidal ideations   . Suicide attempt [facility])     Past Surgical History:  Procedure Laterality Date  . EYE SURGERY  1985  . SHUNT REMOVAL  1983  . Tooth Removal     multiple    Family History:  Family History  Problem Relation Age of Onset  . Heart attack Mother   . Stroke Mother   . Liver cancer Mother   . Diabetes Mother   . Lung cancer Mother   . Alcoholism Father   . Sleep apnea Brother   . Depression Brother   . ADD / ADHD Brother   . Diabetes Maternal Aunt   . Diabetes Paternal Uncle   . Diabetes Maternal Grandmother   . Dementia Maternal Grandmother     Social History:  reports that she has been smoking cigarettes. She has been smoking about 0.25 packs per  day. She has never used smokeless tobacco. She reports previous alcohol use. She reports previous drug use. Drug: Marijuana.  Additional Social History:  Alcohol / Drug Use Pain Medications: please see mar Prescriptions: please see mar Over the Counter: please see mar History of alcohol / drug use?: No history of alcohol / drug abuse Longest period of sobriety (when/how long): NA  CIWA: CIWA-Ar BP: (!) 123/93 Pulse Rate: 71 COWS:    Allergies:  Allergies  Allergen Reactions  . Aspartame And Phenylalanine Anaphylaxis, Hives and Diarrhea  . Benadryl [Diphenhydramine] Anaphylaxis, Diarrhea and Other (See Comments)    Blisters also  . Other Anaphylaxis, Nausea And Vomiting, Rash and Other (See Comments)    Aspartame- Blisters, also Dust- Worsens asthma Ragweed- Worsens asthma, face gets red, and sneezing Animal Fur/Dander- Worsens asthma and sneezing    . Scallops [Shellfish Allergy] Anaphylaxis, Diarrhea and Nausea And Vomiting  . Yellow Jacket Venom [Bee Venom] Anaphylaxis, Diarrhea and Nausea And Vomiting    Seizures and numbness  . Pollen Extract Other (See Comments)    Runny nose, eyes, and asthma worsens  . Tegretol [Carbamazepine] Hives, Diarrhea and Other (See Comments)    Blisters in mouth and increase in seizures  . Adhesive [  Tape] Rash  . Latex Hives and Rash    Blisters, also- Condoms and dental encounters    Home Medications: (Not in a hospital admission)   OB/GYN Status:  Patient's last menstrual period was 11/07/2017 (exact date).  General Assessment Data [facility] of Assessment: [facility] TTS Assessment: In system Is this a Tele or Face-to-Face Assessment?: Face-to-Face Is this an Initial Assessment or a Re-assessment for this encounter?: Initial Assessment Patient Accompanied by:: N/A Language Other than English: No Living Arrangements: Other (Comment) What gender do you identify as?: Female Marital status: Single Maiden name: NA Pregnancy Status:  No Living Arrangements: Spouse/significant other Can pt return to current living arrangement?: Yes Admission Status: Voluntary Is patient capable of signing voluntary admission?: Yes Referral Source: Self/Family/Friend Insurance type: Medicare     Crisis Care Plan Living Arrangements: Spouse/significant other Legal Guardian: Other:(self) Name of Psychiatrist: NA Name of Therapist: NA  Education Status Is patient currently in school?: No Is the patient employed, unemployed or receiving disability?: Unemployed  Risk to self with the past 6 months Suicidal Ideation: No Has patient been a risk to self within the past 6 months prior to admission? : No Suicidal Intent: No Has patient had any suicidal intent within the past 6 months prior to admission? : No Is patient at risk for suicide?: No Suicidal Plan?: No Has patient had any suicidal plan within the past 6 months prior to admission? : No Access to Means: No What has been your use of drugs/alcohol within the last 12 months?: NA Previous Attempts/Gestures: No How many times?: 0 Other Self Harm Risks: NA Triggers for Past Attempts: None known Intentional Self Injurious Behavior: None Family Suicide History: No Recent stressful life event(s): Turmoil (Comment) Persecutory voices/beliefs?: No Depression: Yes Depression Symptoms: Tearfulness, Isolating, Loss of interest in usual pleasures Substance abuse history and/or treatment for substance abuse?: No Suicide prevention information given to non-admitted patients: Not applicable  Risk to Others within the past 6 months Homicidal Ideation: No Does patient have any lifetime risk of violence toward others beyond the six months prior to admission? : No Thoughts of Harm to Others: No Current Homicidal Intent: No Current Homicidal Plan: No Access to Homicidal Means: No Identified Victim: NA History of harm to others?: No Assessment of Violence: None Noted Violent Behavior  Description: NA Does patient have access to weapons?: No Criminal Charges Pending?: No Does patient have a court date: No Is patient on probation?: No  Psychosis Hallucinations: None noted Delusions: None noted  Mental Status Report Appearance/Hygiene: Unremarkable Eye Contact: Fair Motor Activity: Freedom of movement Speech: Logical/coherent Level of Consciousness: Alert Mood: Depressed Affect: Depressed Anxiety Level: Minimal Thought Processes: Coherent, Relevant Judgement: Unimpaired Orientation: Person, Place, Time, Situation Obsessive Compulsive Thoughts/Behaviors: None  Cognitive Functioning Concentration: Normal Memory: Recent Intact, Remote Intact Is patient IDD: No Insight: Fair Impulse Control: Fair Appetite: Fair Have you had any weight changes? : No Change Sleep: No Change Total Hours of Sleep: 8 Vegetative Symptoms: None  ADLScreening [facility] Assessment Services) Patient's cognitive ability adequate to safely complete daily activities?: Yes Patient able to express need for assistance with ADLs?: Yes Independently performs ADLs?: Yes (appropriate for developmental age)  Prior Inpatient Therapy Prior Inpatient Therapy: No  Prior Outpatient Therapy Prior Outpatient Therapy: Yes Prior Therapy Dates: 9 months ago Prior Therapy Facilty/Provider(s): [facility] [place] Reason for Treatment: depression Does patient have an ACCT team?: No Does patient have Intensive In-House Services?  : No Does patient have [employer]  services? : No Does patient have P4CC services?: No  ADL Screening (condition at time of admission) Patient's cognitive ability adequate to safely complete daily activities?: Yes Is the patient deaf or have difficulty hearing?: No Does the patient have difficulty seeing, even when wearing glasses/contacts?: No Does the patient have difficulty concentrating, remembering, or making decisions?: No Patient able to express need for  assistance with ADLs?: Yes Does the patient have difficulty dressing or bathing?: No Independently performs ADLs?: Yes (appropriate for developmental age) Does the patient have difficulty walking or climbing stairs?: No       Abuse/Neglect Assessment (Assessment to be complete while patient is alone) Abuse/Neglect Assessment Can Be Completed: Yes Physical Abuse: Denies Verbal Abuse: Denies Sexual Abuse: Denies Exploitation of patient/patient's resources: Denies     Advance Directives (For Healthcare) Does Patient Have a Medical Advance Directive?: No Would patient like information on creating a medical advance directive?: No - Patient declined          Disposition:  Disposition Initial Assessment Completed for this Encounter: Yes  On Site Evaluation by:   Reviewed with Physician:    Lorenza Cambridge D 08/07/2018 4:03 PM

## 2018-08-07 NOTE — ED Triage Notes (Signed)
Pt states she is very depressed since losing custody of her 32 day old son.  States she only wants to cry or sleep all day long.  She states "I want to get back on my medication for Bipolar and depression. "

## 2018-08-07 NOTE — Discharge Instructions (Signed)
For your behavioral health needs you are advised to follow up with [facility] [place].  New patients are seen at their walk-in clinic.  Walk-in hours are Monday - Friday from 8:00 am - 12:00 pm, and from 1:00 pm - 3:00 pm.  Walk-in patients are seen on a first come, first served basis, so try to arrive as early as possible for the best chance of being seen the same day.  There is an initial fee of $22.50:       [facility] [place], [place] [address]      [phone]  Contact information for [place] is listed below:       [address]      [place], [place] [address]      [phone]

## 2018-08-07 NOTE — ED Notes (Signed)
Pt discharged safely with significant other.  Pt was given resources and verbalized understanding.  Pt was calm and cooperative at discharge.  All belongings were returned to pt.

## 2018-08-07 NOTE — ED Notes (Signed)
TTS at bedside. 

## 2018-08-07 NOTE — ED Provider Notes (Addendum)
[facility] DEPT Provider Note   CSN: [id number] Arrival date & time: 08/07/18  1322     History   Chief Complaint Chief Complaint  Patient presents with  . Depression    HPI Amanda [name] is a 37 y.o. 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  She tells me that she lost custody of her only child to CPS.  Chart review shows that she was seen 3 days ago by the LCSW with her OB/GYN.  She delivered on 07/26/2018 a baby boy who she is calling P.J. chart review shows that she had a vacuum-assisted delivery along with a second-degree perineal tear requiring repair.  She reports that she is healing well, denies any new pain or symptoms.  She denies any physical complaints or concerns today.  She tells me that she had her child taken away because she was living in a motel and did not have a refrigerator to store pumped milk in or a microwave to warm up bottles and milk.  She tells me that she is constantly crying and feels very depressed and anxious.  She denies any SI HI or AVH.  She says that she wishes to get back on her medications so that she can start working towards getting her son back.  HPI  Past Medical History:  Diagnosis Date  . Anxiety   . Asthma   . Asthma due to environmental allergies   . Asthma due to seasonal allergies   . Bipolar 1 disorder ([place])   . Borderline personality disorder ([place])   . Brain bleed ([place])   . Chronic post-traumatic stress disorder (PTSD)    complex chronic with psychotic features and self harm behaviors  . Dander (animal) allergy   . Hearing loss    right ear  . Heart murmur   . Hip dysplasia   . Major depression, chronic   . Mood disorder  ([place])   . OCD (obsessive compulsive disorder)   . RA (rheumatoid arthritis) ([place])   . Rheumatoid arthritis ([place])   . Seizure disorder ([place])   . Suicidal ideations   . Suicide attempt [facility])     Patient Active Problem List   Diagnosis Date Noted  . Postpartum care following vaginal delivery 07/28/2018  . Encounter for induction of labor 07/24/2018  . Gestational hypertension 06/18/2018  . Pap smear of cervix shows high risk HPV present 01/14/2018  . Supervision of high risk pregnancy, antepartum 01/08/2018  . Seizure disorder during pregnancy ([place]) 01/08/2018  . Advanced maternal age, primigravida 01/08/2018  . Bipolar 2 disorder ([place]) 01/08/2018  . Borderline personality disorder ([place]) 01/08/2018  . Major depression, chronic 01/08/2018  . PTSD (post-traumatic stress disorder) 01/08/2018  . OCD (obsessive compulsive disorder) 01/08/2018  . Asthma   . Seizure disorder ([place]) 11/06/2017  . Schizoaffective disorder, depressive type ([place]) 08/16/2017    Past Surgical History:  Procedure Laterality Date  . EYE SURGERY  1985  . SHUNT REMOVAL  1983  . Tooth Removal     multiple     OB History    Gravida  2   Para  1   Term  1   Preterm  0   AB  1   Living  1     SAB  1  TAB  0   Ectopic  0   Multiple  0   Live Births  1            Home Medications    Prior to Admission medications   Medication Sig Start Date End Date Taking? Authorizing Provider  acetaminophen (TYLENOL) 500 MG tablet Take 2 tablets (1,000 mg total) by mouth every 6 (six) hours as needed for moderate pain or headache (for pain scale < 4). 07/28/18  Yes Anyanwu, Sallyanne Havers, MD  butalbital-acetaminophen-caffeine (FIORICET, ESGIC) 50-325-40 MG tablet Take 2 tablets by mouth every 6 (six) hours as needed for headache. 06/19/18  Yes Woodroe Mode, MD  EPINEPHrine 0.3 mg/0.3 mL IJ SOAJ injection Inject 0.3 mg into the muscle as needed (for allergic reaction).    Yes [provider]    labetalol (NORMODYNE) 200 MG tablet Take 1 tablet (200 mg total) by mouth 3 (three) times daily. 07/02/18  Yes Leftwich-Kirby, Kathie Dike, CNM  levETIRAcetam (KEPPRA) 500 MG tablet Take 1 tablet (500 mg total) by mouth 2 (two) times daily. 02/16/18  Yes Kinnie Feil, PA-C  Prenatal Multivit-Min-Fe-FA (PRENATAL VITAMINS) 0.8 MG tablet Take 1 tablet by mouth daily. 01/14/18  Yes Donnamae Jude, MD    Family History Family History  Problem Relation Age of Onset  . Heart attack Mother   . Stroke Mother   . Liver cancer Mother   . Diabetes Mother   . Lung cancer Mother   . Alcoholism Father   . Sleep apnea Brother   . Depression Brother   . ADD / ADHD Brother   . Diabetes Maternal Aunt   . Diabetes Paternal Uncle   . Diabetes Maternal Grandmother   . Dementia Maternal Grandmother     Social History Social History   Tobacco Use  . Smoking status: Current Every Day Smoker    Packs/day: 0.25    Types: Cigarettes  . Smokeless tobacco: Never Used  Substance Use Topics  . Alcohol use: Not Currently    Frequency: Never  . Drug use: Not Currently    Types: Marijuana    Comment:  last used in Jan 2019 due to "RA" per pt     Allergies   Aspartame and phenylalanine; Benadryl [diphenhydramine]; Other; Scallops [shellfish allergy]; Yellow jacket venom [bee venom]; Pollen extract; Tegretol [carbamazepine]; Adhesive [tape]; and Latex   Review of Systems Review of Systems  Constitutional: Negative for chills and fever.  Eyes: Negative for visual disturbance.  Respiratory: Negative for chest tightness and shortness of breath.   Cardiovascular: Negative for chest pain and palpitations.  Gastrointestinal: Negative for abdominal pain, diarrhea, nausea and vomiting.  Genitourinary: Negative for difficulty urinating, dysuria, hematuria and menstrual problem.  Musculoskeletal: Negative for back pain and myalgias.  Psychiatric/Behavioral: Positive for behavioral problems. The patient is  nervous/anxious.   All other systems reviewed and are negative.    Physical Exam Updated Vital Signs BP (!) 123/93 (BP Location: Left Arm)   Pulse 71   Temp 98.5 F (36.9 C) (Oral)   LMP 11/07/2017 (Exact Date)   SpO2 97%   Breastfeeding No Comment: 12 days post partum  Physical Exam Vitals signs and nursing note reviewed.  Constitutional:      General: She is not in acute distress.    Appearance: She is well-developed.  HENT:     Head: Normocephalic and atraumatic.  Eyes:     Conjunctiva/sclera: Conjunctivae normal.  Neck:     Musculoskeletal: Normal range of  motion and neck supple.  Cardiovascular:     Rate and Rhythm: Normal rate and regular rhythm.     Heart sounds: Normal heart sounds. No murmur.  Pulmonary:     Effort: Pulmonary effort is normal. No respiratory distress.     Breath sounds: Normal breath sounds.  Abdominal:     General: There is no distension.     Palpations: Abdomen is soft. There is no mass.     Tenderness: There is no abdominal tenderness.     Hernia: No hernia is present.     Comments: Consistent with post partum state  Skin:    General: Skin is warm and dry.  Neurological:     General: No focal deficit present.     Mental Status: She is alert.  Psychiatric:        Mood and Affect: Mood is anxious and depressed. Affect is tearful.        Speech: Speech is tangential.        Behavior: Behavior normal.        Thought Content: Thought content does not include homicidal or suicidal ideation. Thought content does not include homicidal or suicidal plan.      ED Treatments / Results  Labs (all labs ordered are listed, but only abnormal results are displayed) Labs Reviewed  COMPREHENSIVE METABOLIC PANEL - Abnormal; Notable for the following components:      Result Value   CO2 21 (*)    Albumin 3.4 (*)    All other components within normal limits  CBC WITH DIFFERENTIAL/PLATELET - Abnormal; Notable for the following components:   WBC 17.7  (*)    Platelets 546 (*)    Neutro Abs 12.6 (*)    Abs Immature Granulocytes 0.20 (*)    All other components within normal limits  ACETAMINOPHEN LEVEL - Abnormal; Notable for the following components:   Acetaminophen (Tylenol), Serum <10 (*)    All other components within normal limits  ETHANOL  RAPID URINE DRUG SCREEN, HOSP PERFORMED  SALICYLATE LEVEL    EKG None  Radiology No results found.  Procedures Procedures (including critical care time)  Medications Ordered in ED Medications - No data to display   Initial Impression / Assessment and Plan / ED Course  I have reviewed the triage vital signs and the nursing notes.  Pertinent labs & imaging results that were available during my care of the patient were reviewed by me and considered in my medical decision making (see chart for details).    Krisy Dix presents today for evaluation of depression and requesting to get re-started on psychiatric medications.  She denies SI, HI, and AVH.  She does not have any physical complaints or concerns today.  She says that her pain has been gradually improving since delivery.  Labs were ordered by myself.  TTS called and asked to order TTS consult before medically clear given reported multiple anticipated consults.  Patient was discharged prior to my medical clearance or being able to re-evaluate patient.  She was discharged by another before I could discuss return precautions or review labs.   Final Clinical Impressions(s) / ED Diagnoses   Final diagnoses:  Major depression, chronic    ED Discharge Orders    None       Lorin Glass, PA-C 08/07/18 2111    Lorin Glass, PA-C 08/07/18 2119    Drenda Freeze, MD 08/08/18 [phone]

## 2018-08-07 NOTE — Progress Notes (Addendum)
Patient ID: Amanda [name], female   DOB: 26-Jul-1981, 37 y.o.   MRN: [id number]  Pt was seen and chart reviewed with treatment team and Dr Amanda [name]. Pt recently gave birth and is living in a motel with no working refrigerator or microwave. Pt's son was placed in CPS custody 4 days ago. Pt has underlying mental health issues that she has not taken medications for since she found out she was pregnant. She is followed by [facility] [place] for psychiatry and medication management but she has not contacted them to restart her medications since the birth of her child. Pt has been counseled by [place] [facility] and was last seen there on 08-04-18. Part of her plan was to contact [facility] to restart her medications. Pt will be given the phone numbers for [facility] and DSS for assistance with housing.  Pt denies suicidal/homicidal ideation, denies auditory/visual hallucinations and does not appear to be responding to internal stimuli. Pt is stable for discharge.   Amanda [name], [place] 08-07-18       8921'  Patient's chart reviewed and case discussed with the physician extender and developed treatment plan. Reviewed the information documented and agree with the treatment plan.  Amanda Dresser, DO 08/07/18 4:35 PM

## 2018-08-08 ENCOUNTER — Ambulatory Visit (INDEPENDENT_AMBULATORY_CARE_PROVIDER_SITE_OTHER): Payer: Medicare (Managed Care) | Admitting: Clinical

## 2018-08-08 DIAGNOSIS — Z658 Other specified problems related to psychosocial circumstances: Secondary | ICD-10-CM | POA: Diagnosis not present

## 2018-08-17 ENCOUNTER — Emergency Department (HOSPITAL_COMMUNITY)
Admission: EM | Admit: 2018-08-17 | Discharge: 2018-08-17 | Disposition: A | Discharge status: Home / Self Care | Payer: Medicare (Managed Care) | Attending: Emergency Medicine | Admitting: Emergency Medicine

## 2018-08-17 DIAGNOSIS — F17210 Nicotine dependence, cigarettes, uncomplicated: Secondary | ICD-10-CM | POA: Diagnosis not present

## 2018-08-17 DIAGNOSIS — R569 Unspecified convulsions: Secondary | ICD-10-CM | POA: Diagnosis not present

## 2018-08-17 DIAGNOSIS — Z91040 Latex allergy status: Secondary | ICD-10-CM | POA: Diagnosis not present

## 2018-08-17 DIAGNOSIS — R251 Tremor, unspecified: Secondary | ICD-10-CM | POA: Diagnosis present

## 2018-08-17 DIAGNOSIS — R259 Unspecified abnormal involuntary movements: Secondary | ICD-10-CM

## 2018-08-17 LAB — CBC WITH DIFFERENTIAL/PLATELET
Abs Immature Granulocytes: 0.04 10*3/uL (ref 0.00–0.07)
Basophils Absolute: 0.1 10*3/uL (ref 0.0–0.1)
Basophils Relative: 1 %
Eosinophils Absolute: 0.1 10*3/uL (ref 0.0–0.5)
Eosinophils Relative: 1 %
HCT: 40.5 % (ref 36.0–46.0)
Hemoglobin: 12.6 g/dL (ref 12.0–15.0)
IMMATURE GRANULOCYTES: 0 %
Lymphocytes Relative: 34 %
Lymphs Abs: 3.4 10*3/uL (ref 0.7–4.0)
MCH: 28.8 pg (ref 26.0–34.0)
MCHC: 31.1 g/dL (ref 30.0–36.0)
MCV: 92.5 fL (ref 80.0–100.0)
Monocytes Absolute: 0.8 10*3/uL (ref 0.1–1.0)
Monocytes Relative: 8 %
Neutro Abs: 5.5 10*3/uL (ref 1.7–7.7)
Neutrophils Relative %: 56 %
Platelets: 473 10*3/uL — ABNORMAL HIGH (ref 150–400)
RBC: 4.38 MIL/uL (ref 3.87–5.11)
RDW: 13 % (ref 11.5–15.5)
WBC: 9.9 10*3/uL (ref 4.0–10.5)
nRBC: 0 % (ref 0.0–0.2)

## 2018-08-17 LAB — COMPREHENSIVE METABOLIC PANEL
ALT: 13 U/L (ref 0–44)
AST: 14 U/L — AB (ref 15–41)
Albumin: 3.9 g/dL (ref 3.5–5.0)
Alkaline Phosphatase: 94 U/L (ref 38–126)
Anion gap: 9 (ref 5–15)
BUN: 11 mg/dL (ref 6–20)
CO2: 23 mmol/L (ref 22–32)
CREATININE: 0.77 mg/dL (ref 0.44–1.00)
Calcium: 9.1 mg/dL (ref 8.9–10.3)
Chloride: 108 mmol/L (ref 98–111)
GFR calc Af Amer: >60 mL/min (ref >60)
Glucose, Bld: 93 mg/dL (ref 70–99)
Potassium: 3.8 mmol/L (ref 3.5–5.1)
Sodium: 140 mmol/L (ref 135–145)
Total Bilirubin: 0.6 mg/dL (ref 0.3–1.2)
Total Protein: 7 g/dL (ref 6.5–8.1)

## 2018-08-17 LAB — URINALYSIS, ROUTINE W REFLEX MICROSCOPIC
Bacteria, UA: NONE SEEN
Bilirubin Urine: NEGATIVE
Glucose, UA: NEGATIVE mg/dL
Ketones, ur: NEGATIVE mg/dL
Nitrite: NEGATIVE
Protein, ur: NEGATIVE mg/dL
Specific Gravity, Urine: 1.016 (ref 1.005–1.030)
pH: 6 (ref 5.0–8.0)

## 2018-08-17 LAB — CBG MONITORING, ED: Glucose-Capillary: 85 mg/dL (ref 70–99)

## 2018-08-17 MED ORDER — LORAZEPAM 2 MG/ML IJ SOLN
2.0000 mg | Freq: Once | INTRAMUSCULAR | Status: AC
  Administered 2018-08-17: 2 mg via INTRAVENOUS
  Filled 2018-08-17: qty 1

## 2018-08-17 MED ORDER — LEVETIRACETAM 250 MG PO TABS: 750.0000 mg | ORAL_TABLET | Freq: Two times a day (BID) | ORAL | 0 refills | Status: DC

## 2018-08-17 MED ORDER — CYCLOBENZAPRINE HCL 10 MG PO TABS: 10.0000 mg | ORAL_TABLET | Freq: Two times a day (BID) | ORAL | 0 refills | Status: DC | PRN

## 2018-08-17 NOTE — ED Provider Notes (Signed)
[place] DEPT Provider Note   CSN: [id number] Arrival date & time: 08/17/18  0946     History   Chief Complaint Chief Complaint  Patient presents with  . Tremors    HPI Amanda [name] is a 37 y.o. female.  HPI   Amanda [name] is a 37 y.o. 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  Reports that has been going on for approximately 1 hour.  Reports she had similar episode this summer after a bee sting.  Describes having jerking of both of her upper extremities, and head movements.  She has not lost consciousness.  Reports she has had this before, but after he goes on long enough she stops moving her right arm because she is had issues with it in the past.  Reports she had a headache that started at the same time as the jerking. Mild.   Reports she had hypertension during pregnancy, but no diagnosis of preeclampsia.  Past Medical History:  Diagnosis Date  . Anxiety   . Asthma   . Asthma due to environmental allergies   . Asthma due to seasonal allergies   . Bipolar 1 disorder ([place])   . Borderline personality disorder ([place])   . Brain bleed ([place])   . Chronic post-traumatic stress disorder (PTSD)    complex chronic with psychotic features and self harm behaviors  . Dander (animal) allergy   . Hearing loss    right ear  . Heart murmur   . Hip dysplasia   . Major depression, chronic   . Mood disorder ([place])   . OCD (obsessive compulsive disorder)   . RA (rheumatoid arthritis) ([place])   . Rheumatoid arthritis ([place])   . Seizure disorder ([place])   . Suicidal ideations   . Suicide attempt [facility])     Patient Active Problem List   Diagnosis Date Noted  . Postpartum care following vaginal  delivery 07/28/2018  . Encounter for induction of labor 07/24/2018  . Gestational hypertension 06/18/2018  . Pap smear of cervix shows high risk HPV present 01/14/2018  . Supervision of high risk pregnancy, antepartum 01/08/2018  . Seizure disorder during pregnancy ([place]) 01/08/2018  . Advanced maternal age, primigravida 01/08/2018  . Bipolar 2 disorder ([place]) 01/08/2018  . Borderline personality disorder ([place]) 01/08/2018  . Major depression, chronic 01/08/2018  . PTSD (post-traumatic stress disorder) 01/08/2018  . OCD (obsessive compulsive disorder) 01/08/2018  . Asthma   . Seizure disorder ([place]) 11/06/2017  . Schizoaffective disorder, depressive type ([place]) 08/16/2017    Past Surgical History:  Procedure Laterality Date  . EYE SURGERY  1985  . SHUNT REMOVAL  1983  . Tooth Removal     multiple     OB History    Gravida  2   Para  1   Term  1   Preterm  0   AB  1   Living  1     SAB  1   TAB  0   Ectopic  0   Multiple  0   Live Births  1            Home Medications    Prior to Admission medications   Medication Sig Start Date End Date Taking? Authorizing Provider  acetaminophen (TYLENOL) 500 MG tablet Take  2 tablets (1,000 mg total) by mouth every 6 (six) hours as needed for moderate pain or headache (for pain scale < 4). 07/28/18  Yes Anyanwu, Sallyanne Havers, MD  butalbital-acetaminophen-caffeine (FIORICET, ESGIC) 50-325-40 MG tablet Take 2 tablets by mouth every 6 (six) hours as needed for headache. 06/19/18  Yes Woodroe Mode, MD  EPINEPHrine 0.3 mg/0.3 mL IJ SOAJ injection Inject 0.3 mg into the muscle as needed (for allergic reaction).    Yes [provider]  labetalol (NORMODYNE) 200 MG tablet Take 1 tablet (200 mg total) by mouth 3 (three) times daily. 07/02/18  Yes Leftwich-Kirby, Kathie Dike, CNM  Prenatal Multivit-Min-Fe-FA (PRENATAL VITAMINS) 0.8 MG tablet Take 1 tablet by mouth daily. 01/14/18  Yes Donnamae Jude, MD  cyclobenzaprine (FLEXERIL)  10 MG tablet Take 1 tablet (10 mg total) by mouth 2 (two) times daily as needed for muscle spasms. 08/17/18   Gareth Morgan, MD  levETIRAcetam (KEPPRA) 250 MG tablet Take 3 tablets (750 mg total) by mouth 2 (two) times daily. 08/17/18   Gareth Morgan, MD    Family History Family History  Problem Relation Age of Onset  . Heart attack Mother   . Stroke Mother   . Liver cancer Mother   . Diabetes Mother   . Lung cancer Mother   . Alcoholism Father   . Sleep apnea Brother   . Depression Brother   . ADD / ADHD Brother   . Diabetes Maternal Aunt   . Diabetes Paternal Uncle   . Diabetes Maternal Grandmother   . Dementia Maternal Grandmother     Social History Social History   Tobacco Use  . Smoking status: Current Every Day Smoker    Packs/day: 0.25    Types: Cigarettes  . Smokeless tobacco: Never Used  Substance Use Topics  . Alcohol use: Not Currently    Frequency: Never  . Drug use: Not Currently    Types: Marijuana    Comment:  last used in Jan 2019 due to "RA" per pt     Allergies   Aspartame and phenylalanine; Benadryl [diphenhydramine]; Other; Scallops [shellfish allergy]; Yellow jacket venom [bee venom]; Pollen extract; Tegretol [carbamazepine]; Adhesive [tape]; and Latex   Review of Systems Review of Systems  Constitutional: Negative for fever.  HENT: Negative for sore throat.   Eyes: Negative for visual disturbance.  Respiratory: Negative for cough and shortness of breath.   Cardiovascular: Negative for chest pain.  Gastrointestinal: Negative for abdominal pain.  Genitourinary: Negative for difficulty urinating.  Musculoskeletal: Negative for back pain and neck pain.  Skin: Negative for rash.  Neurological: Positive for seizures and headaches. Negative for syncope, facial asymmetry, weakness and numbness.     Physical Exam Updated Vital Signs BP (!) 147/74   Pulse (!) 59   Temp 98.1 F (36.7 C) (Oral)   Resp 18   LMP 11/07/2017 (Exact Date)    SpO2 98%   Physical Exam Vitals signs and nursing note reviewed.  Constitutional:      General: She is not in acute distress.    Appearance: She is well-developed. She is not diaphoretic.  HENT:     Head: Normocephalic and atraumatic.  Eyes:     Conjunctiva/sclera: Conjunctivae normal.  Neck:     Musculoskeletal: Normal range of motion.  Cardiovascular:     Rate and Rhythm: Normal rate and regular rhythm.     Heart sounds: Normal heart sounds. No murmur. No friction rub. No gallop.   Pulmonary:  Effort: Pulmonary effort is normal. No respiratory distress.     Breath sounds: Normal breath sounds. No wheezing or rales.  Abdominal:     General: There is no distension.     Palpations: Abdomen is soft.     Tenderness: There is no abdominal tenderness. There is no guarding.  Musculoskeletal:        General: No tenderness.  Skin:    General: Skin is warm and dry.     Findings: No erythema or rash.  Neurological:     Mental Status: She is alert and oriented to person, place, and time.     Cranial Nerves: No cranial nerve deficit.     Sensory: No sensory deficit.     Comments: Brief jerking movements of bilateral UE, left greater than right, right stops with hand stopping it, jerking of head in brief side to side movements. No LOC with episode. Speaking between jerking movements.       ED Treatments / Results  Labs (all labs ordered are listed, but only abnormal results are displayed) Labs Reviewed  URINALYSIS, ROUTINE W REFLEX MICROSCOPIC - Abnormal; Notable for the following components:      Result Value   APPearance HAZY (*)    Hgb urine dipstick MODERATE (*)    Leukocytes, UA SMALL (*)    All other components within normal limits  CBC WITH DIFFERENTIAL/PLATELET - Abnormal; Notable for the following components:   Platelets 473 (*)    All other components within normal limits  COMPREHENSIVE METABOLIC PANEL - Abnormal; Notable for the following components:   AST 14 (*)      All other components within normal limits  CBG MONITORING, ED    EKG None  Radiology No results found.  Procedures Procedures (including critical care time)  Medications Ordered in ED Medications  LORazepam (ATIVAN) injection 2 mg (2 mg Intravenous Given 08/17/18 1200)     Initial Impression / Assessment and Plan / ED Course  I have reviewed the triage vital signs and the nursing notes.  Pertinent labs & imaging results that were available during my care of the patient were reviewed by me and considered in my medical decision making (see chart for details).     37 y.o. female with a complicated psychiatric history, seizure disorder, suspect hx of AVMs, recent vaginal delivery 07/26/2018 with baby taken away by DSS per patient, who presents with concern for seizure like activity.  Difficult to discern whether she is having stress induced psychogenic nonepileptic seizures given no LOC and bilateral involvement and head shaking, however patient with long history of seizures and has had similar episodes before and has no history of PNES.  Consulted neurology for further evaluation.  Teleneuro recommending EEG which will require admission to [facility]. Discussed with Neurohospitalist, Dr. Leonel Ramsay who came to bedside for evaluation. Presentation may be consistent with myoclonic seizures. Recommend giving ativan and reassessing. If improved, will increase keppra and have patient follow up with Dr. Lavell Anchors.  Patient improved after 2mg  ativan.  UA without acute findings. Given seizures similar to seizure disorder, had gestation htn, no urine protein, doubt eclampsia.  Patient with episode of underlying seizures disorder. Reports compliance with meds. Seizure now resolved. Will increase keppra to 750mg  BID and have her follow up iwht her Neurologist.  Final Clinical Impressions(s) / ED Diagnoses   Final diagnoses:  Seizure [facility])    ED Discharge Orders         Ordered     levETIRAcetam (  KEPPRA) 250 MG tablet  2 times daily     08/17/18 1529    cyclobenzaprine (FLEXERIL) 10 MG tablet  2 times daily PRN     08/17/18 1543           Gareth Morgan, MD 08/17/18 2220

## 2018-08-17 NOTE — ED Notes (Signed)
Bed: HO12 Expected date:  Expected time:  Means of arrival:  Comments: EMS/tremors

## 2018-08-17 NOTE — ED Triage Notes (Signed)
[place] by [facility]-- hx of seizures and patient reports that she has been experiencing "tremors" this morning. Patient also reports left shoulder pain. Patient reports compliance with Keppra. AAO x 4. VSS with EMS.

## 2018-08-17 NOTE — ED Notes (Signed)
Tremors have subsided at this time per patient and per assessment.

## 2018-08-17 NOTE — ED Notes (Signed)
ED Provider at bedside. 

## 2018-08-17 NOTE — Consult Note (Signed)
Neurology Consultation Reason for Consult: Possible seizures Referring Physician: Billy Fischer, E  CC: Possible seizures  History is obtained from: Patient  HPI: Amanda [name] is a 37 y.o. female with a reported history of epilepsy since childhood as well as an extensive psych history who presents with abnormal movements.  There apparently was some reported shaking his head side to side, as well as jerking of bilateral arms.  This happens intermittently every few seconds.  The left arm is involved predominantly, but does appear that her right arm has some involvement as well.  But not as prominently.  She is able to talk both before and after these episodes in the last for only a moment at a time.   Of note, she is recently postpartum and apparently "had her baby taken away."  ROS: A 14 point ROS was performed and is negative except as noted in the HPI.   Past Medical History:  Diagnosis Date  . Anxiety   . Asthma   . Asthma due to environmental allergies   . Asthma due to seasonal allergies   . Bipolar 1 disorder ([place])   . Borderline personality disorder ([place])   . Brain bleed ([place])   . Chronic post-traumatic stress disorder (PTSD)    complex chronic with psychotic features and self harm behaviors  . Dander (animal) allergy   . Hearing loss    right ear  . Heart murmur   . Hip dysplasia   . Major depression, chronic   . Mood disorder ([place])   . OCD (obsessive compulsive disorder)   . RA (rheumatoid arthritis) ([place])   . Rheumatoid arthritis ([place])   . Seizure disorder ([place])   . Suicidal ideations   . Suicide attempt [facility])      Family History  Problem Relation Age of Onset  . Heart attack Mother   . Stroke Mother   . Liver cancer Mother   . Diabetes Mother   . Lung cancer Mother   . Alcoholism Father   . Sleep apnea Brother   . Depression Brother   . ADD / ADHD Brother   . Diabetes Maternal Aunt   . Diabetes Paternal Uncle   . Diabetes Maternal Grandmother   .  Dementia Maternal Grandmother      Social History:  reports that she has been smoking cigarettes. She has been smoking about 0.25 packs per day. She has never used smokeless tobacco. She reports previous alcohol use. She reports previous drug use. Drug: Marijuana.   Exam: Current vital signs: BP (!) 156/77   Pulse (!) 59   Temp 98.1 F (36.7 C) (Oral)   Resp (!) 22   LMP 11/07/2017 (Exact Date)   SpO2 99%  Vital signs in last 24 hours: Temp:  [98.1 F (36.7 C)] 98.1 F (36.7 C) (12/29 0958) Pulse Rate:  [59-65] 59 (12/29 1130) Resp:  [17-22] 22 (12/29 1130) BP: (146-163)/(77-102) 156/77 (12/29 1130) SpO2:  [96 %-99 %] 99 % (12/29 1130)   Physical Exam  Constitutional: Appears well-developed and well-nourished.  Psych: Affect appropriate to situation Eyes: No scleral injection HENT: No OP obstrucion Head: Normocephalic.  Cardiovascular: Normal rate and regular rhythm.  Respiratory: Effort normal, non-labored breathing GI: Soft.  No distension. There is no tenderness.  Skin: WDI  Neuro: Mental Status: Patient is awake, alert, oriented to person, place, month, year, and situation. Patient is able to give a clear and coherent history. No signs of aphasia or neglect Cranial Nerves: II: Visual Fields are  full. Pupils are equal, round, and reactive to light.   III,IV, VI: EOMI without ptosis or diploplia.  V: Facial sensation is symmetric to temperature VII: Facial movement is symmetric.  VIII: hearing is intact to voice X: Uvula elevates symmetrically XI: Shoulder shrug is symmetric. XII: tongue is midline without atrophy or fasciculations.  Motor: Tone is normal. Bulk is normal. 5/5 strength was present in all four extremities.  Sensory: Sensation is symmetric to light touch and temperature in the arms and legs. Cerebellar: FNF and HKS are intact bilaterally  She has intermittent left-sided predominant jerking type movements with raising of the left arm or  ballistic type movement when checking finger-nose-finger.  When checking on the right the right arm is involved as well.    I have reviewed labs in epic and the results pertinent to this consultation are: CMP-unremarkable  I have reviewed the images obtained: MRI brain from March- normal  Impression: 37 year old female with a reported history of seizures since childhood who presents with intermittent episodic jerks.  The unilateral predominance makes myoclonic epilepsy less likely, and I think there is a significant concern for psychogenic illness, but with her history I am not certain that we can exclude that these represent myoclonic seizures.  I would favor treating her with Ativan.  If this does calm things down, then I think that she still could be discharged.  If she continues to have these movements, then she may need consideration of transfer for EEG.  Recommendations: 1) Ativan 2 mg x 1 2) if she responds well to this, consider increasing Keppra to 750 twice daily and calling Dr. Lavell Anchors 3) if she does not respond to Ativan, would consider transfer to [facility] for EEG.   Roland Rack, MD [facility] [phone]  If 7pm- 7am, please page neurology on call as listed in [place].

## 2018-08-17 NOTE — Consult Note (Signed)
   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 Recommend EEG. No change to AED regiment at this time.    Recommendations:  Admit for EEG Neuro consult.  ---------------------------------------------------------------------  CC: convulsion  History of Present Illness:  37 yo F with h/o epilepsy, post partum (delivered 12/7), extensive psych history but no known PNES. She presents with abnormal movements - shaking head side to side, (no-no) and jerking the bilateral arms initially but now just the left arm. Has been awake the whole time. She reports that she was asleep when it started and her fiance woke her up when it started.   Diagnostic Testing:   Vital Signs:   Vitals:   08/17/18 0958 08/17/18 1030  BP: (!) 146/82 (!) 163/84  Pulse:  65  Resp: 18 18  Temp: 98.1 F (36.7 C)   SpO2: 96% 98%     Exam:  Mental Status:  Awake, alert, oriented  Naming: Intact Repetition: Intact   Speech: fluent  Cranial Nerves:  Pupils: Equal round and reactive to light Extraocular movements: Intact in all cardinal gaze Ptosis: Absent Visual fields: Intact to finger counting Facial sensation: Intact to pin and light touch Facial movements: Intact and symmetric    Motor Exam:  Weak antigravity strength of the right leg - chronic per the patient.    Sensory Exam:   Decreased on the right leg.     Coordination:   Finger to nose: Intact Heel to shin: Intact  Medical Decision Making:  - Extensive number of diagnosis or management options are considered above.   - Extensive amount of complex data reviewed.   - High risk of complication and/or morbidity or mortality are associated with differential diagnostic considerations above.  - There may be uncertain outcome and increased probability of prolonged  functional impairment or high probability of severe prolonged functional impairment associated with some of these differential diagnosis.   Medical Data Reviewed:  1.Data reviewed include clinical labs, radiology,  Medical Tests;   2.Tests results discussed w/performing or interpreting physician;   3.Obtaining/reviewing old medical records;  4.Obtaining case history from another source;  5.Independent review of image, tracing or specimen.    Patient was informed the Neurology Consult would happen via TeleHealth consult by way of interactive audio and video telecommunications and consented to receiving care in this manner.

## 2018-08-26 ENCOUNTER — Encounter: Payer: Self-pay | Admitting: *Deleted

## 2018-09-01 ENCOUNTER — Ambulatory Visit (INDEPENDENT_AMBULATORY_CARE_PROVIDER_SITE_OTHER): Payer: Medicare (Managed Care) | Admitting: Clinical

## 2018-09-01 ENCOUNTER — Encounter: Payer: Self-pay | Admitting: Advanced Practice Midwife

## 2018-09-01 ENCOUNTER — Ambulatory Visit (INDEPENDENT_AMBULATORY_CARE_PROVIDER_SITE_OTHER): Payer: Medicare (Managed Care) | Admitting: Advanced Practice Midwife

## 2018-09-01 DIAGNOSIS — F3181 Bipolar II disorder: Secondary | ICD-10-CM | POA: Diagnosis not present

## 2018-09-01 DIAGNOSIS — Z658 Other specified problems related to psychosocial circumstances: Secondary | ICD-10-CM | POA: Diagnosis not present

## 2018-09-01 DIAGNOSIS — L905 Scar conditions and fibrosis of skin: Secondary | ICD-10-CM

## 2018-09-01 LAB — POCT PREGNANCY, URINE: PREG TEST UR: NEGATIVE

## 2018-09-01 NOTE — BH Specialist Note (Signed)
Integrated Behavioral Health Initial Visit  MRN: [id number] Name: Amanda [name]  Number of [facility] Clinician visits:: 8; (1 previous appointment less than 15 minute). Pt agrees to go to [facility] walk-in clinic this week in order to re-start her medications with psychiatry asap. Pt did not do [facility] today (per 7th visit).  Session Start time: 2:30  Session End time: 3:05 Total time: 35 minutes  Type of Service: [place] Interpretor:No. Interpretor Name and Language: n/a   Warm Hand Off Completed.       SUBJECTIVE: Amanda [name] is a 38 y.o. female accompanied by n/a Patient was referred by self today for life stress. Patient reports the following symptoms/concerns: Pt states her primary concern today is life stress (lack of stable housing, food insecurity, lack of transportation). Pt has not re-established care at [facility], but wants to see a psychiatrist soon to meet CPS requirement.  Duration of problem: Ongoing; Severity of problem: moderate  OBJECTIVE: Mood: Normal and Affect: Appropriate Risk of harm to self or others: No plan to harm self or others  LIFE CONTEXT: Family and Social: Pt lives with FOB; visitation with infant son in foster care twice weekly School/Work: Pt on disability; FOB works  Self-Care: - Life Changes: Recent childbirth, baby in [place] custody due to unstable housing  GOALS ADDRESSED: Patient will: 1. Demonstrate ability to: Increase motivation to adhere to plan of care  INTERVENTIONS: Interventions utilized: Motivational Interviewing  Standardized Assessments completed: GAD-7 and PHQ 9  ASSESSMENT: Patient currently experiencing Bipolar 2 disorder, Psychosocial stress   Patient may benefit from referral to community behavioral health and therapeutic intervention .  PLAN: 1. Follow up with behavioral health clinician on : - 2. Behavioral recommendations:  -Visitation with  son, Konrad Felix, tomorrow, 09-02-18 -Walk-in clinic [facility] early 09-03-18 to establish care  3. Referral(s): [place] (In Clinic) and [facility] (LME/Outside Clinic) 4. "From scale of 1-10, how likely are you to follow plan?": -  Garlan Fair, LCSW  Depression screen [facility] 2/9 09/01/2018 07/21/2018 07/16/2018 07/11/2018 07/04/2018  Decreased Interest 1 0 0 0 0  Down, Depressed, Hopeless 1 0 0 1 0  PHQ - 2 Score 2 0 0 1 0  Altered sleeping 0 0 0 0 1  Tired, decreased energy 1 1 0 0 1  Change in appetite 1 0 0 0 0  Feeling bad or failure about yourself  1 0 0 0 0  Trouble concentrating 1 0 0 0 0  Moving slowly or fidgety/restless 0 0 0 0 0  Suicidal thoughts 0 0 0 0 0  PHQ-9 Score 6 1 0 1 2  Difficult doing work/chores - - - - -  Some recent data might be hidden   GAD 7 : Generalized Anxiety Score 09/01/2018 07/21/2018 07/16/2018 07/11/2018  Nervous, Anxious, on Edge 1 1 0 0  Control/stop worrying 1 0 0 1  Worry too much - different things 1 1 0 1  Trouble relaxing 1 1 0 0  Restless 1 0 0 0  Easily annoyed or irritable 1 1 0 1  Afraid - awful might happen 1 0 0 1  Total GAD 7 Score 7 4 0 4  Anxiety Difficulty - - - -

## 2018-09-01 NOTE — Progress Notes (Signed)
Subjective:     Amanda [name] is a 38 y.o. female who presents for a postpartum visit. She is 7 weeks postpartum following a Vacuum Extractor. I have fully reviewed the prenatal and intrapartum course. The delivery was at 37.2 gestational weeks. Outcome: vacuum, outlet. Anesthesia: epidural. Postpartum course has been unremarkable. Baby's course has been unremarkable. Baby is feeding by bottle - unknown. Bleeding no bleeding. Bowel function is normal. Bladder function is normal. Patient is not sexually active. Contraception method is none, but desires IUD. Postpartum depression screening: positive.  Patient attempted intercourse approx 2 days ago, but reports that it was 2 painful. So, she had to stop.   Patient does not have custody of child. She is living in a hotel at the moment. Infant was taken from the home due to lack of hot water, refrigerator and microwave.   The following portions of the patient's history were reviewed and updated as appropriate: allergies, current medications, past family history, past medical history, past social history, past surgical history and problem list.  Review of Systems Pertinent items are noted in HPI.   Objective:    BP 124/77   Pulse 70   Wt 188 lb (85.3 kg)   LMP 11/07/2017 (Exact Date)   BMI 29.44 kg/m   Physical Exam  Constitutional: She is oriented to person, place, and time and well-developed, well-nourished, and in no distress.  Cardiovascular: Normal rate.  Pulmonary/Chest: Effort normal.  Abdominal: Soft. There is no abdominal tenderness.  Genitourinary:    Genitourinary Comments: Perineal laceration repair is not well healed. Still a small area of granulation tissue at the introitus, and another area that appears to have grown together improperly.      Neurological: She is alert and oriented to person, place, and time.  Skin: Skin is warm and dry.  Psychiatric: Affect normal.  Nursing note and vitals reviewed.  Dr. Ihor Dow  consulted to examine the laceration repair. Patient was unable to tolerate speculum exam for her as well. So IUD could not be inserted today. Patient will need revision in the OR.  Assessment:   1. Postpartum care and examination   2. Scar tissue     Plan:  IUD unable to be inserted today due to abnormal scar tissue at the introitus Dr. Eugenie Norrie will notify Amanda [name] to schedule revision in the [place] for IUD insertion in the OR as well if possible Patient to see [facility] today    Marcille Buffy DNP, CNM  09/01/18  1:48 PM

## 2018-09-03 ENCOUNTER — Encounter (HOSPITAL_COMMUNITY): Payer: Self-pay

## 2018-09-03 ENCOUNTER — Other Ambulatory Visit: Payer: Self-pay | Admitting: General Practice

## 2018-09-03 DIAGNOSIS — Z30430 Encounter for insertion of intrauterine contraceptive device: Secondary | ICD-10-CM

## 2018-09-03 MED ORDER — LEVONORGESTREL 20 MCG/24HR IU IUD: 1.0000 | INTRAUTERINE_SYSTEM | Freq: Once | INTRAUTERINE | 0 refills | Status: DC

## 2018-09-04 DIAGNOSIS — O901 Disruption of perineal obstetric wound: Secondary | ICD-10-CM

## 2018-09-05 ENCOUNTER — Encounter (HOSPITAL_COMMUNITY): Payer: Self-pay | Admitting: Emergency Medicine

## 2018-09-05 ENCOUNTER — Emergency Department (HOSPITAL_COMMUNITY)
Admission: EM | Admit: 2018-09-05 | Discharge: 2018-09-05 | Disposition: A | Discharge status: Home / Self Care | Payer: Medicare (Managed Care) | Attending: Emergency Medicine | Admitting: Emergency Medicine

## 2018-09-05 DIAGNOSIS — M25551 Pain in right hip: Secondary | ICD-10-CM | POA: Insufficient documentation

## 2018-09-05 DIAGNOSIS — Z79899 Other long term (current) drug therapy: Secondary | ICD-10-CM | POA: Insufficient documentation

## 2018-09-05 DIAGNOSIS — F17210 Nicotine dependence, cigarettes, uncomplicated: Secondary | ICD-10-CM | POA: Diagnosis not present

## 2018-09-05 DIAGNOSIS — M545 Low back pain: Secondary | ICD-10-CM | POA: Diagnosis present

## 2018-09-05 DIAGNOSIS — Z91040 Latex allergy status: Secondary | ICD-10-CM | POA: Diagnosis not present

## 2018-09-05 DIAGNOSIS — Z5320 Procedure and treatment not carried out because of patient's decision for unspecified reasons: Secondary | ICD-10-CM | POA: Insufficient documentation

## 2018-09-05 DIAGNOSIS — J45909 Unspecified asthma, uncomplicated: Secondary | ICD-10-CM | POA: Insufficient documentation

## 2018-09-05 MED ORDER — IBUPROFEN 800 MG PO TABS
800.0000 mg | ORAL_TABLET | Freq: Once | ORAL | Status: AC
  Administered 2018-09-05: 800 mg via ORAL
  Filled 2018-09-05: qty 1
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
## 2018-09-10 NOTE — Pre-Procedure Instructions (Signed)
Amanda [name]  09/10/2018      [id number] - [place], [address] AT [address] [place] [address] Phone: [phone] Fax: [phone]    Your procedure is scheduled on Tues., Jan. 28, 2020 from 11:30AM-12:40PM  Report to [facility] at 10:00AM  Call this number if you have problems the morning of surgery:  [phone]   Remember:  Do not eat or drink after midnight except medicines on Jan. 27th    Take these medicines the morning of surgery with A SIP OF WATER: Labetalol (NORMODYNE) and LevETIRAcetam (KEPPRA)  If needed: Acetaminophen (TYLENOL), Cyclobenzaprine (FLEXERIL), EPINEPHrine, and Albuterol (PROVENTIL HFA;VENTOLIN HFA) Inhaler- bring with you the day of surgery  As of today, stop taking all Other Aspirin Products, Vitamins, Fish oils, and Herbal medications. Also stop all NSAIDS i.e. Advil, Ibuprofen, Motrin, Aleve, Anaprox, Naproxen, BC, Goody Powders, and all Supplements.    Do not wear jewelry, make-up or nail polish.  Do not wear lotions, powders, or perfumes. You may wear deodorant.  Do not shave 48 hours prior to surgery.   Do not bring valuables to the hospital.  [facility] is not responsible for any belongings or valuables.  Contacts, dentures or bridgework may not be worn into surgery.  Leave your suitcase in the car.  After surgery it may be brought to your room.  For patients admitted to the hospital, discharge time will be determined by your treatment team.  Patients discharged the day of surgery will not be allowed to drive home.   Special instructions:  [place]- Preparing For Surgery  Before surgery, you can play an important role. Because skin is not sterile, your skin needs to be as free of germs as possible. You can reduce the number of germs on your skin by washing with CHG (chlorahexidine gluconate) Soap before surgery.  CHG is an  antiseptic cleaner which kills germs and bonds with the skin to continue killing germs even after washing.    Oral Hygiene is also important to reduce your risk of infection.  Remember - BRUSH YOUR TEETH THE MORNING OF SURGERY WITH YOUR REGULAR TOOTHPASTE  Please do not use if you have an allergy to CHG or antibacterial soaps. If your skin becomes reddened/irritated stop using the CHG.  Do not shave (including legs and underarms) for at least 48 hours prior to first CHG shower. It is OK to shave your face.  Please follow these instructions carefully.   1. Shower the NIGHT BEFORE SURGERY and the MORNING OF SURGERY with CHG.   2. If you chose to wash your hair, wash your hair first as usual with your normal shampoo.  3. After you shampoo, rinse your hair and body thoroughly to remove the shampoo.  4. Use CHG as you would any other liquid soap. You can apply CHG directly to the skin and wash gently with a scrungie or a clean washcloth.   5. Apply the CHG Soap to your body ONLY FROM THE NECK DOWN.  Do not use on open wounds or open sores. Avoid contact with your eyes, ears, mouth and genitals (private parts). Wash Face and genitals (private parts)  with your normal soap.  6. Wash thoroughly, paying special attention to the area where your surgery will be performed.  7. Thoroughly rinse your body with warm water from the neck down.  8. DO NOT shower/wash with your normal soap after using  and rinsing off the CHG Soap.  9. Pat yourself dry with a CLEAN TOWEL.  10. Wear CLEAN PAJAMAS to bed the night before surgery, wear comfortable clothes the morning of surgery  11. Place CLEAN SHEETS on your bed the night of your first shower and DO NOT SLEEP WITH PETS.  Day of Surgery:  Do not apply any lotions.  Please wear clean clothes to the hospital/surgery center.   Remember to brush your teeth WITH YOUR REGULAR TOOTHPASTE.  Please read over the following fact sheets that you were given. Pain  Booklet, Coughing and Deep Breathing and Surgical Site Infection Prevention

## 2018-09-11 ENCOUNTER — Other Ambulatory Visit: Payer: Self-pay

## 2018-09-11 ENCOUNTER — Encounter (HOSPITAL_COMMUNITY): Payer: Self-pay

## 2018-09-11 ENCOUNTER — Encounter (HOSPITAL_COMMUNITY)
Admission: RE | Admit: 2018-09-11 | Discharge: 2018-09-11 | Disposition: A | Discharge status: Home / Self Care | Payer: Medicare (Managed Care) | Source: Ambulatory Visit | Attending: Obstetrics & Gynecology | Admitting: Obstetrics & Gynecology

## 2018-09-11 DIAGNOSIS — Z01812 Encounter for preprocedural laboratory examination: Secondary | ICD-10-CM | POA: Diagnosis present

## 2018-09-11 HISTORY — DX: Essential (primary) hypertension: I10

## 2018-09-11 HISTORY — DX: Post-traumatic stress disorder, unspecified: F43.10

## 2018-09-11 HISTORY — DX: Other specified postprocedural states: R11.2

## 2018-09-11 HISTORY — DX: Schizophrenia, unspecified: F20.9

## 2018-09-11 HISTORY — DX: Pneumonia, unspecified organism: J18.9

## 2018-09-11 HISTORY — DX: Adverse effect of unspecified anesthetic, initial encounter: T41.45XA

## 2018-09-11 HISTORY — DX: Other complications of anesthesia, initial encounter: T88.59XA

## 2018-09-11 HISTORY — DX: Other specified postprocedural states: Z98.890

## 2018-09-11 HISTORY — DX: Constipation, unspecified: K59.00

## 2018-09-11 LAB — BASIC METABOLIC PANEL
ANION GAP: 10 (ref 5–15)
BUN: 13 mg/dL (ref 6–20)
CO2: 21 mmol/L — ABNORMAL LOW (ref 22–32)
Calcium: 9.9 mg/dL (ref 8.9–10.3)
Chloride: 108 mmol/L (ref 98–111)
Creatinine, Ser: 0.84 mg/dL (ref 0.44–1.00)
GFR calc Af Amer: >60 mL/min (ref >60)
GFR calc non Af Amer: >60 mL/min (ref >60)
Glucose, Bld: 87 mg/dL (ref 70–99)
Potassium: 4 mmol/L (ref 3.5–5.1)
Sodium: 139 mmol/L (ref 135–145)

## 2018-09-11 LAB — CBC
HCT: 42.6 % (ref 36.0–46.0)
Hemoglobin: 13.1 g/dL (ref 12.0–15.0)
MCH: 26.6 pg (ref 26.0–34.0)
MCHC: 30.8 g/dL (ref 30.0–36.0)
MCV: 86.6 fL (ref 80.0–100.0)
Platelets: 425 10*3/uL — ABNORMAL HIGH (ref 150–400)
RBC: 4.92 MIL/uL (ref 3.87–5.11)
RDW: 13.4 % (ref 11.5–15.5)
WBC: 12.5 10*3/uL — ABNORMAL HIGH (ref 4.0–10.5)
nRBC: 0 % (ref 0.0–0.2)

## 2018-09-11 NOTE — Pre-Procedure Instructions (Signed)
Amanda [name]  09/11/2018      [id number] - [place], [address] AT [address] [place] [place] [address] Phone: [phone] Fax: [phone]    Your procedure is scheduled on Tues., Jan. 28, 2020    Report to [facility] at 10:00AM             (posted surgery time 11:30a - 12:40p)   Call this number if you have problems the morning of surgery:  [phone]   Remember:   Do not eat or drink after midnight except medicines on Monday Jan. 27th    Take these medicines the morning of surgery with A SIP OF WATER: Labetalol (NORMODYNE) and LevETIRAcetam (KEPPRA)  If needed: Acetaminophen (TYLENOL), Cyclobenzaprine (FLEXERIL), EPINEPHrine, and Albuterol (PROVENTIL HFA;VENTOLIN HFA) Inhaler- bring with you the day of surgery  As of today, stop taking all Other Aspirin Products, Vitamins, Fish oils, and Herbal medications. Also stop all NSAIDS i.e. Advil, Ibuprofen, Motrin, Aleve, Anaprox, Naproxen, BC, Goody Powders, and all Supplements.    Do not wear jewelry, make-up or nail polish.  Do not wear lotions, powders, or perfumes. You may wear deodorant.  Do not shave 48 hours prior to surgery.   Do not bring valuables to the hospital.  [facility] is not responsible for any belongings or valuables.  Contacts, dentures or bridgework may not be worn into surgery.  Leave your suitcase in the car.  After surgery it may be brought to your room.  For patients admitted to the hospital, discharge time will be determined by your treatment team.  Patients discharged the day of surgery will not be allowed to drive home, and will need someone to stay with you for the first 24 hrs.    Special instructions:  [place]- Preparing For Surgery  Before surgery, you can play an important role. Because skin is not sterile, your skin needs to be as free of germs as possible. You can reduce the number of  germs on your skin by washing with CHG (chlorahexidine gluconate) Soap before surgery.  CHG is an antiseptic cleaner which kills germs and bonds with the skin to continue killing germs even after washing.    Oral Hygiene is also important to reduce your risk of infection.    Remember - BRUSH YOUR TEETH THE MORNING OF SURGERY WITH YOUR REGULAR TOOTHPASTE  Please do not use if you have an allergy to CHG or antibacterial soaps. If your skin becomes reddened/irritated stop using the CHG.  Do not shave (including legs and underarms) for at least 48 hours prior to first CHG shower. It is OK to shave your face.  Please follow these instructions carefully.   1. Shower the NIGHT BEFORE SURGERY and the MORNING OF SURGERY with CHG.   2. If you chose to wash your hair, wash your hair first as usual with your normal shampoo.  3. After you shampoo, rinse your hair and body thoroughly to remove the shampoo.  4. Use CHG as you would any other liquid soap. You can apply CHG directly to the skin and wash gently with a scrungie or a clean washcloth.   5. Apply the CHG Soap to your body ONLY FROM THE NECK DOWN.  Do not use on open wounds or open sores. Avoid contact with your eyes, ears, mouth and genitals (private parts). Wash Face and genitals (private parts)  with your normal soap.  6. Wash  thoroughly, paying special attention to the area where your surgery will be performed.  7. Thoroughly rinse your body with warm water from the neck down.  8. DO NOT shower/wash with your normal soap after using and rinsing off the CHG Soap.  9. Pat yourself dry with a CLEAN TOWEL.  10. Wear CLEAN PAJAMAS to bed the night before surgery, wear comfortable clothes the morning of surgery  11. Place CLEAN SHEETS on your bed the night of your first shower and DO NOT SLEEP WITH PETS.  Day of Surgery:  Do not apply any lotions.  Please wear clean clothes to the hospital/surgery center.   Remember to brush your teeth  WITH YOUR REGULAR TOOTHPASTE.  Please read over the following fact sheets that you were given. Pain Booklet and Surgical Site Infection Prevention

## 2018-09-11 NOTE — Progress Notes (Signed)
Arrived 58 min late for her 2p PAT appt.

## 2018-09-11 NOTE — Progress Notes (Signed)
Ms Canaday denies chest pain or shortness of breath.  Patient reports that she has a heart murmer, doesn't think she has seen a cardiologist. PCP is Dr Marilynne Drivers with [facility]. I requested records. Ms. Solimine states she has not had a seizure since 08/18/2018.  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 Patient has had brain bleeds in the past. Neurologist is Dr. Shelda Jakes.  Ms Wrisley is bipolar, she is currently off mediations due to recent pregancy.  I emphasized to patient and her finance that she should not be late for surgery.

## 2018-09-11 NOTE — Progress Notes (Deleted)
Arrived 58 min late for her 2p PAT appt.

## 2018-09-12 NOTE — Anesthesia Preprocedure Evaluation (Addendum)
Anesthesia Evaluation  Patient identified by MRN, date of birth, ID band Patient awake    Reviewed: Allergy & Precautions, NPO status , Patient's Chart, lab work & pertinent test results  History of Anesthesia Complications (+) PONV  Airway Mallampati: II  TM Distance: >3 FB Neck ROM: Full    Dental  (+) Poor Dentition,    Pulmonary asthma , Current Smoker,    Pulmonary exam normal breath sounds clear to auscultation       Cardiovascular hypertension, Pt. on medications negative cardio ROS Normal cardiovascular exam Rhythm:Regular Rate:Normal     Neuro/Psych PSYCHIATRIC DISORDERS Bipolar Disorder    GI/Hepatic negative GI ROS, Neg liver ROS,   Endo/Other  negative endocrine ROS  Renal/GU negative Renal ROS     Musculoskeletal   Abdominal   Peds  Hematology negative hematology ROS (+)   Anesthesia Other Findings   Reproductive/Obstetrics                          Anesthesia Physical Anesthesia Plan  ASA: III  Anesthesia Plan: General   Post-op Pain Management:    Induction: Intravenous  PONV Risk Score and Plan: 3 and Treatment may vary due to age or medical condition, Dexamethasone and Ondansetron  Airway Management Planned: LMA  Additional Equipment:   Intra-op Plan:   Post-operative Plan:   Informed Consent: I have reviewed the patients History and Physical, chart, labs and discussed the procedure including the risks, benefits and alternatives for the proposed anesthesia with the patient or authorized representative who has indicated his/her understanding and acceptance.     Dental advisory given  Plan Discussed with: CRNA  Anesthesia Plan Comments: (PAT note written 09/12/2018 by Myra Gianotti, PA-C. )       Anesthesia Quick Evaluation
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
## 2018-09-16 ENCOUNTER — Encounter (HOSPITAL_COMMUNITY): Payer: Self-pay

## 2018-09-16 ENCOUNTER — Encounter (HOSPITAL_COMMUNITY)
Admission: RE | Disposition: A | Discharge status: Home / Self Care | Payer: Self-pay | Source: Home / Self Care | Attending: Obstetrics & Gynecology

## 2018-09-16 ENCOUNTER — Ambulatory Visit (HOSPITAL_COMMUNITY)
Admission: RE | Admit: 2018-09-16 | Discharge: 2018-09-16 | Disposition: A | Discharge status: Home / Self Care | Payer: Medicare (Managed Care) | Attending: Obstetrics & Gynecology | Admitting: Obstetrics & Gynecology

## 2018-09-16 ENCOUNTER — Other Ambulatory Visit: Payer: Self-pay

## 2018-09-16 ENCOUNTER — Ambulatory Visit (HOSPITAL_COMMUNITY): Payer: Medicare (Managed Care) | Admitting: Physician Assistant

## 2018-09-16 ENCOUNTER — Ambulatory Visit (HOSPITAL_COMMUNITY): Payer: Medicare (Managed Care) | Admitting: Anesthesiology

## 2018-09-16 DIAGNOSIS — F4312 Post-traumatic stress disorder, chronic: Secondary | ICD-10-CM | POA: Insufficient documentation

## 2018-09-16 DIAGNOSIS — F3181 Bipolar II disorder: Secondary | ICD-10-CM | POA: Diagnosis present

## 2018-09-16 DIAGNOSIS — F429 Obsessive-compulsive disorder, unspecified: Secondary | ICD-10-CM | POA: Diagnosis present

## 2018-09-16 DIAGNOSIS — Z79899 Other long term (current) drug therapy: Secondary | ICD-10-CM | POA: Insufficient documentation

## 2018-09-16 DIAGNOSIS — I10 Essential (primary) hypertension: Secondary | ICD-10-CM | POA: Insufficient documentation

## 2018-09-16 DIAGNOSIS — Z30430 Encounter for insertion of intrauterine contraceptive device: Secondary | ICD-10-CM | POA: Diagnosis not present

## 2018-09-16 DIAGNOSIS — F603 Borderline personality disorder: Secondary | ICD-10-CM | POA: Diagnosis not present

## 2018-09-16 DIAGNOSIS — F209 Schizophrenia, unspecified: Secondary | ICD-10-CM | POA: Diagnosis not present

## 2018-09-16 DIAGNOSIS — J45998 Other asthma: Secondary | ICD-10-CM | POA: Insufficient documentation

## 2018-09-16 DIAGNOSIS — F251 Schizoaffective disorder, depressive type: Secondary | ICD-10-CM | POA: Diagnosis present

## 2018-09-16 DIAGNOSIS — F329 Major depressive disorder, single episode, unspecified: Secondary | ICD-10-CM | POA: Diagnosis present

## 2018-09-16 DIAGNOSIS — F319 Bipolar disorder, unspecified: Secondary | ICD-10-CM | POA: Diagnosis not present

## 2018-09-16 DIAGNOSIS — F17210 Nicotine dependence, cigarettes, uncomplicated: Secondary | ICD-10-CM | POA: Insufficient documentation

## 2018-09-16 DIAGNOSIS — O901 Disruption of perineal obstetric wound: Secondary | ICD-10-CM | POA: Diagnosis present

## 2018-09-16 DIAGNOSIS — G40909 Epilepsy, unspecified, not intractable, without status epilepticus: Secondary | ICD-10-CM

## 2018-09-16 DIAGNOSIS — F4310 Post-traumatic stress disorder, unspecified: Secondary | ICD-10-CM | POA: Diagnosis present

## 2018-09-16 HISTORY — PX: INTRAUTERINE DEVICE (IUD) INSERTION: SHX5877

## 2018-09-16 HISTORY — PX: PERINEUM REPAIR: SHX2219

## 2018-09-16 LAB — PREGNANCY, URINE: Preg Test, Ur: NEGATIVE

## 2018-09-16 SURGERY — REPAIR, EPISIOTOMY
Anesthesia: General

## 2018-09-16 MED ORDER — DEXAMETHASONE SODIUM PHOSPHATE 10 MG/ML IJ SOLN
INTRAMUSCULAR | Status: DC | PRN
  Administered 2018-09-16: 10 mg via INTRAVENOUS

## 2018-09-16 MED ORDER — LACTATED RINGERS IV SOLN
INTRAVENOUS | Status: DC
  Administered 2018-09-16: 125 mL/h via INTRAVENOUS
  Administered 2018-09-16: 12:00:00 via INTRAVENOUS

## 2018-09-16 MED ORDER — BUPIVACAINE HCL (PF) 0.5 % IJ SOLN
INTRAMUSCULAR | Status: AC
  Filled 2018-09-16: qty 30

## 2018-09-16 MED ORDER — CEFAZOLIN SODIUM-DEXTROSE 2-4 GM/100ML-% IV SOLN
INTRAVENOUS | Status: AC
  Filled 2018-09-16: qty 100

## 2018-09-16 MED ORDER — GLYCOPYRROLATE 0.2 MG/ML IJ SOLN
INTRAMUSCULAR | Status: AC
  Filled 2018-09-16: qty 1

## 2018-09-16 MED ORDER — LIDOCAINE HCL (CARDIAC) PF 100 MG/5ML IV SOSY
PREFILLED_SYRINGE | INTRAVENOUS | Status: DC | PRN
  Administered 2018-09-16: 100 mg via INTRAVENOUS

## 2018-09-16 MED ORDER — ESTRADIOL 0.1 MG/GM VA CREA
TOPICAL_CREAM | VAGINAL | Status: AC
  Filled 2018-09-16: qty 42.5

## 2018-09-16 MED ORDER — ONDANSETRON HCL 4 MG/2ML IJ SOLN
INTRAMUSCULAR | Status: DC | PRN
  Administered 2018-09-16: 4 mg via INTRAVENOUS

## 2018-09-16 MED ORDER — MIDAZOLAM HCL 2 MG/2ML IJ SOLN
INTRAMUSCULAR | Status: DC | PRN
  Administered 2018-09-16: 2 mg via INTRAVENOUS

## 2018-09-16 MED ORDER — FENTANYL CITRATE (PF) 100 MCG/2ML IJ SOLN
INTRAMUSCULAR | Status: DC | PRN
  Administered 2018-09-16 (×3): 50 ug via INTRAVENOUS

## 2018-09-16 MED ORDER — PROPOFOL 10 MG/ML IV BOLUS
INTRAVENOUS | Status: AC
  Filled 2018-09-16: qty 20

## 2018-09-16 MED ORDER — ESTRADIOL 0.1 MG/GM VA CREA: TOPICAL_CREAM | VAGINAL | 12 refills | Status: DC

## 2018-09-16 MED ORDER — SCOPOLAMINE 1 MG/3DAYS TD PT72
MEDICATED_PATCH | TRANSDERMAL | Status: AC
  Administered 2018-09-16: 1.5 mg via TRANSDERMAL
  Filled 2018-09-16: qty 1

## 2018-09-16 MED ORDER — LIDOCAINE HCL (CARDIAC) PF 100 MG/5ML IV SOSY
PREFILLED_SYRINGE | INTRAVENOUS | Status: AC
  Filled 2018-09-16: qty 5

## 2018-09-16 MED ORDER — ONDANSETRON HCL 4 MG/2ML IJ SOLN
INTRAMUSCULAR | Status: AC
  Filled 2018-09-16: qty 2

## 2018-09-16 MED ORDER — FENTANYL CITRATE (PF) 250 MCG/5ML IJ SOLN
INTRAMUSCULAR | Status: AC
  Filled 2018-09-16: qty 5

## 2018-09-16 MED ORDER — MIDAZOLAM HCL 2 MG/2ML IJ SOLN
INTRAMUSCULAR | Status: AC
  Filled 2018-09-16: qty 2

## 2018-09-16 MED ORDER — KETOROLAC TROMETHAMINE 30 MG/ML IJ SOLN
INTRAMUSCULAR | Status: AC
  Filled 2018-09-16: qty 1

## 2018-09-16 MED ORDER — PROPOFOL 10 MG/ML IV BOLUS
INTRAVENOUS | Status: DC | PRN
  Administered 2018-09-16: 200 mg via INTRAVENOUS

## 2018-09-16 MED ORDER — ESTRADIOL 0.1 MG/GM VA CREA
TOPICAL_CREAM | VAGINAL | Status: DC | PRN
  Administered 2018-09-16: 1 via VAGINAL

## 2018-09-16 MED ORDER — GLYCOPYRROLATE 0.2 MG/ML IJ SOLN
INTRAMUSCULAR | Status: DC | PRN
  Administered 2018-09-16: 0.2 mg via INTRAVENOUS

## 2018-09-16 MED ORDER — DEXAMETHASONE SODIUM PHOSPHATE 10 MG/ML IJ SOLN
INTRAMUSCULAR | Status: AC
  Filled 2018-09-16: qty 1

## 2018-09-16 MED ORDER — SCOPOLAMINE 1 MG/3DAYS TD PT72
1.0000 | MEDICATED_PATCH | Freq: Once | TRANSDERMAL | Status: DC
  Administered 2018-09-16: 1.5 mg via TRANSDERMAL

## 2018-09-16 MED ORDER — CEFAZOLIN SODIUM-DEXTROSE 2-4 GM/100ML-% IV SOLN
2.0000 g | INTRAVENOUS | Status: AC
  Administered 2018-09-16: 2 g via INTRAVENOUS

## 2018-09-16 SURGICAL SUPPLY — 10 items
CATH SILICONE 16FRX5CC (CATHETERS) ×2 IMPLANT
GLOVE BIOGEL PI IND STRL 7.0 (GLOVE) ×2 IMPLANT
GLOVE BIOGEL PI INDICATOR 7.0 (GLOVE) ×2
GLOVE SURG SS PI 6.5 STRL IVOR (GLOVE) ×2 IMPLANT
GOWN STRL REUS W/TWL LRG LVL3 (GOWN DISPOSABLE) ×4 IMPLANT
HIBICLENS CHG 4% 4OZ BTL (MISCELLANEOUS) ×2 IMPLANT
NEEDLE SPNL 22GX3.5 QUINCKE BK (NEEDLE) ×2 IMPLANT
PACK VAGINAL MINOR WOMEN LF (CUSTOM PROCEDURE TRAY) ×2 IMPLANT
PAD PREP 24X48 CUFFED NSTRL (MISCELLANEOUS) ×2 IMPLANT
TOWEL OR 17X24 6PK STRL BLUE (TOWEL DISPOSABLE) ×4 IMPLANT

## 2018-09-16 NOTE — Anesthesia Procedure Notes (Signed)
Procedure Name: LMA Insertion Date/Time: 09/16/2018 11:24 AM Performed by: Barnet Glasgow, MD Pre-anesthesia Checklist: Patient identified, Patient being monitored, Emergency Drugs available, Timeout performed and Suction available Patient Re-evaluated:Patient Re-evaluated prior to induction Oxygen Delivery Method: Circle System Utilized Preoxygenation: Pre-oxygenation with 100% oxygen Induction Type: IV induction Ventilation: Mask ventilation without difficulty LMA: LMA inserted LMA Size: 4.0 Number of attempts: 1 Placement Confirmation: positive ETCO2 and breath sounds checked- equal and bilateral

## 2018-09-16 NOTE — Transfer of Care (Signed)
Immediate Anesthesia Transfer of Care Note  Patient: Amanda [name]  Procedure(s) Performed: EPISIOTOMY REVISION (N/A ) INTRAUTERINE DEVICE (IUD) INSERTION (N/A )  Patient Location: PACU  Anesthesia Type:General  Level of Consciousness: awake, alert  and oriented  Airway & Oxygen Therapy: Patient Spontanous Breathing and Patient connected to nasal cannula oxygen  Post-op Assessment: Report given to RN, Post -op Vital signs reviewed and stable and Patient moving all extremities X 4  Post vital signs: Reviewed and stable  Last Vitals:  Vitals Value Taken Time  BP 116/73 09/16/2018 12:01 PM  Temp    Pulse 69 09/16/2018 12:05 PM  Resp 14 09/16/2018 12:05 PM  SpO2 97 % 09/16/2018 12:05 PM  Vitals shown include unvalidated device data.  Last Pain:  Vitals:   09/16/18 1023  TempSrc: Oral  PainSc: 0-No pain      Patients Stated Pain Goal: 4 (21/58/72 7618)  Complications: No apparent anesthesia complications

## 2018-09-16 NOTE — Anesthesia Postprocedure Evaluation (Signed)
Anesthesia Post Note  Patient: Amanda [name]  Procedure(s) Performed: EPISIOTOMY REVISION (N/A ) INTRAUTERINE DEVICE (IUD) INSERTION (N/A )     Patient location during evaluation: PACU Anesthesia Type: General Level of consciousness: awake and alert Pain management: pain level controlled Vital Signs Assessment: post-procedure vital signs reviewed and stable Respiratory status: spontaneous breathing, nonlabored ventilation, respiratory function stable and patient connected to nasal cannula oxygen Cardiovascular status: blood pressure returned to baseline and stable Postop Assessment: no apparent nausea or vomiting Anesthetic complications: no    Last Vitals:  Vitals:   09/16/18 1230 09/16/18 1245  BP: 110/76 138/75  Pulse: (!) 49 (!) 48  Resp: 12 18  Temp:  36.4 C  SpO2: 99% 98%    Last Pain:  Vitals:   09/16/18 1245  TempSrc:   PainSc: 3    Pain Goal: Patients Stated Pain Goal: 4 (09/16/18 1245)                 Barnet Glasgow

## 2018-09-16 NOTE — H&P (Signed)
Preoperative History and Physical  Amanda [name] is a 38 y.o. G2P1011 here for surgical management of disruption of postpartum perineal laceration and contraception management.    Proposed surgery: post partum perineal laceration revision and IUD insertion under anesthesia  Past Medical History:  Diagnosis Date  . Anxiety   . Asthma   . Asthma due to environmental allergies   . Asthma due to seasonal allergies   . Bipolar 1 disorder ([place])   . Borderline personality disorder ([place])   . Brain bleed ([place])   . Chronic post-traumatic stress disorder (PTSD)    complex chronic with psychotic features and self harm behaviors  . Complication of anesthesia   . Constipation   . Dander (animal) allergy   . Hearing loss    right ear  . Heart murmur    denies seeing a cardiologist  . Hip dysplasia   . Hypertension    Gestional   . Major depression, chronic   . Mood disorder ([place])   . OCD (obsessive compulsive disorder)   . Pneumonia   . PONV (postoperative nausea and vomiting)   . PTSD (post-traumatic stress disorder)   . RA (rheumatoid arthritis) ([place])   . Rheumatoid arthritis ([place])   . Schizophrenia ([employer])    "I think that is wrong"  . Seizure disorder ([place])   . Suicidal ideations   . Suicide attempt [facility])    Past Surgical History:  Procedure Laterality Date  . Brain Shunt  1981   "a few hours old"  . EYE SURGERY Bilateral 1985  . SHUNT REMOVAL  1983  . Tooth Removal     multiple   OB History    Gravida  2   Para  1   Term  1   Preterm  0   AB  1   Living  1     SAB  1   TAB  0   Ectopic  0   Multiple  0   Live Births  1          Patient denies any cervical dysplasia or STIs. No medications prior to admission.    Allergies  Allergen Reactions  . Aspartame And Phenylalanine Anaphylaxis, Hives and Diarrhea  . Benadryl [Diphenhydramine] Anaphylaxis, Diarrhea and Other (See Comments)    Blisters also  . Other Anaphylaxis, Nausea And Vomiting, Rash  and Other (See Comments)    Aspartame- Blisters, also Dust- Worsens asthma Ragweed- Worsens asthma, face gets red, and sneezing Animal Fur/Dander- Worsens asthma and sneezing    . Scallops [Shellfish Allergy] Anaphylaxis, Diarrhea and Nausea And Vomiting  . Yellow Jacket Venom [Bee Venom] Anaphylaxis, Diarrhea and Nausea And Vomiting    Seizures and numbness  . Pollen Extract Other (See Comments)    Runny nose, eyes, and asthma worsens  . Tegretol [Carbamazepine] Hives, Diarrhea and Other (See Comments)    Blisters in mouth and increase in seizures  . Adhesive [Tape] Rash  . Latex Hives and Rash    Blisters, also- Condoms and dental encounters   Social History:   reports that she has been smoking cigarettes. She has a 13.00 pack-year smoking history. She has never used smokeless tobacco. She reports previous alcohol use. She reports previous drug use. Drug: Marijuana. Family History  Problem Relation Age of Onset  . Heart attack Mother   . Stroke Mother   . Liver cancer Mother   . Diabetes Mother   . Lung cancer Mother   . Alcoholism Father   .  Sleep apnea Brother   . Depression Brother   . ADD / ADHD Brother   . Diabetes Maternal Aunt   . Diabetes Paternal Uncle   . Diabetes Maternal Grandmother   . Dementia Maternal Grandmother     Review of Systems: Noncontributory  PHYSICAL EXAM: Last menstrual period 11/07/2017, not currently breastfeeding. General appearance - alert, well appearing, and in no distress Chest - clear to auscultation, no wheezes, rales or rhonchi, symmetric air entry Heart - normal rate and regular rhythm Abdomen - soft, nontender, nondistended, no masses or organomegaly Pelvic - examination not indicated Extremities - peripheral pulses normal, no pedal edema, no clubbing or cyanosis  Labs: Results for orders placed or performed during the hospital encounter of 09/11/18 (from the past 336 hour(s))  Basic metabolic panel   Collection Time: 09/11/18   3:41 PM  Result Value Ref Range   Sodium 139 135 - 145 mmol/L   Potassium 4.0 3.5 - 5.1 mmol/L   Chloride 108 98 - 111 mmol/L   CO2 21 (L) 22 - 32 mmol/L   Glucose, Bld 87 70 - 99 mg/dL   BUN 13 6 - 20 mg/dL   Creatinine, Ser 0.84 0.44 - 1.00 mg/dL   Calcium 9.9 8.9 - 10.3 mg/dL   GFR calc non Af Amer >60 >60 mL/min   GFR calc Af Amer >60 >60 mL/min   Anion gap 10 5 - 15  CBC   Collection Time: 09/11/18  3:41 PM  Result Value Ref Range   WBC 12.5 (H) 4.0 - 10.5 K/uL   RBC 4.92 3.87 - 5.11 MIL/uL   Hemoglobin 13.1 12.0 - 15.0 g/dL   HCT 42.6 36.0 - 46.0 %   MCV 86.6 80.0 - 100.0 fL   MCH 26.6 26.0 - 34.0 pg   MCHC 30.8 30.0 - 36.0 g/dL   RDW 13.4 11.5 - 15.5 %   Platelets 425 (H) 150 - 400 K/uL   nRBC 0.0 0.0 - 0.2 %    Imaging Studies: No results found.  Assessment: Patient Active Problem List   Diagnosis Date Noted  . Disruption of episiotomy wound in the puerperium 09/04/2018  . Postpartum care following vaginal delivery 07/28/2018  . Encounter for IUD insertion 07/24/2018  . Gestational hypertension 06/18/2018  . Pap smear of cervix shows high risk HPV present 01/14/2018  . Supervision of high risk pregnancy, antepartum 01/08/2018  . Seizure disorder during pregnancy ([place]) 01/08/2018  . Advanced maternal age, primigravida 01/08/2018  . Bipolar 2 disorder ([place]) 01/08/2018  . Borderline personality disorder ([place]) 01/08/2018  . Major depression, chronic 01/08/2018  . PTSD (post-traumatic stress disorder) 01/08/2018  . OCD (obsessive compulsive disorder) 01/08/2018  . Asthma   . Seizure disorder ([place]) 11/06/2017  . Schizoaffective disorder, depressive type ([place]) 08/16/2017    Plan: Patient will undergo surgical management with revision of post partum perineal laceration revision and IUD insertion under anesthesia.   The risks of surgery were discussed in detail with the patient including but not limited to: bleeding which may require transfusion or reoperation;  infection which may require antibiotics; injury to surrounding organs which may involve bowel, bladder, ureters ; need for additional procedures including laparoscopy or laparotomy; thromboembolic phenomenon, surgical site problems and other postoperative/anesthesia complications. Likelihood of success in alleviating the patient's condition was discussed. Routine postoperative instructions will be reviewed with the patient and her family in detail after surgery.  The patient concurred with the proposed plan, giving informed written consent for the surgery.  Patient has been NPO since last night she will remain NPO for procedure.  Anesthesia and OR aware.  Preoperative prophylactic antibiotics and SCDs ordered on call to the OR.  To OR when ready.  Pavel Gadd L. Ihor Dow, M.D., [facility] 09/16/2018 9:36 AM

## 2018-09-16 NOTE — Op Note (Signed)
09/16/2018  12:06 PM  PATIENT:  Amanda [name]  38 y.o. female  PRE-OPERATIVE DIAGNOSIS:  Episiotomy Repair Undesired Fertility  POST-OPERATIVE DIAGNOSIS:  Episiotomy Repair Undesired Fertility  PROCEDURE:  Procedure(s): EPISIOTOMY REVISION (N/A) INTRAUTERINE DEVICE (IUD) INSERTION (N/A)  SURGEON:  Surgeon(s) and Role:    * Lavonia Drafts, MD - Primary  ANESTHESIA:   general  EBL:  1 mL   BLOOD ADMINISTERED:none  DRAINS: none   LOCAL MEDICATIONS USED:  NONE  SPECIMEN:  No Specimen  DISPOSITION OF SPECIMEN:  N/A  COUNTS:  YES  TOURNIQUET:  * No tourniquets in log *  DICTATION: .Note written in EPIC  PLAN OF CARE: Discharge to home after PACU  PATIENT DISPOSITION:  PACU - hemodynamically stable.   Delay start of Pharmacological VTE agent (>24hrs) due to surgical blood loss or risk of bleeding: yes  Complications: none immediate  GYNECOLOGY CLINIC PROCEDURE NOTE  Amanda [name] is a 38 y.o. G2P1011 here for episiotomy revision and Liletta IUD insertion. Pt has not been able to have intercourse PP due to very tight introitus.    Patient identified, informed consent performed.  Discussed risks of irregular bleeding, cramping, infection, malpositioning or misplacement of the IUD outside the uterus which may require further procedures. Time out was performed.  Urine pregnancy test negative.  The patient was prepped dn draped in the usual sterile fashion. The perineum was identified and had 2 dense adhesions across the introitus. These were transected using curved Mayo scissors. The posterior vagina was intact and after the adhesions were transected the perineum was found ot be intact. At this point, a bivalve speculum placed in the vagina.  Cervix visualized.  Cleaned with Betadine x 2.  Grasped anteriorly with a single tooth tenaculum.  Uterus sounded to 10 cm.  Liletta IUD placed per manufacturer's recommendations.  Strings trimmed to 3 cm. Tenaculum was removed,  good hemostasis noted.  Patient tolerated procedure well. Sliver nitrate was used to manage the bleeding from the transected adhesions and EES cream was placed at the introitus. Excellent hemostasis was noted.   Patient was given post-procedure instructions.  Patient was asked to follow up in 4 weeks for IUD check.  Zak Gondek L. Harraway-Smith, M.D., [facility]

## 2018-09-16 NOTE — Discharge Instructions (Signed)
Post Anesthesia Home Care Instructions  Activity: Get plenty of rest for the remainder of the day. A responsible individual must stay with you for 24 hours following the procedure.  For the next 24 hours, DO NOT: -Drive a car -Paediatric nurse -Drink alcoholic beverages -Take any medication unless instructed by your physician -Make any legal decisions or sign important papers.  Meals: Start with liquid foods such as gelatin or soup. Progress to regular foods as tolerated. Avoid greasy, spicy, heavy foods. If nausea and/or vomiting occur, drink only clear liquids until the nausea and/or vomiting subsides. Call your physician if vomiting continues.  Special Instructions/Symptoms: Your throat may feel dry or sore from the anesthesia or the breathing tube placed in your throat during surgery. If this causes discomfort, gargle with warm salt water. The discomfort should disappear within 24 hours.  If you had a scopolamine patch placed behind your ear for the management of post- operative nausea and/or vomiting:  1. The medication in the patch is effective for 72 hours, after which it should be removed.  Wrap patch in a tissue and discard in the trash. Wash hands thoroughly with soap and water. 2. You may remove the patch earlier than 72 hours if you experience unpleasant side effects which may include dry mouth, dizziness or visual disturbances. 3. Avoid touching the patch. Wash your hands with soap and water after contact with the patch.    Wound Dehiscence  Wound dehiscence is when a surgical incision breaks open and does not heal properly after surgery. It usually happens 7-10 days after surgery. This can be a serious condition. It is important to identify and treat this condition early. What are the causes? Common causes of this condition include:  Stretching of the wound area. This may be caused by lifting, vomiting, violent coughing, or straining during bowel movements.  Wound  infection.  Early removal or rupture of stitches (sutures). What increases the risk? The following factors may make you more likely to develop this condition:  Obesity.  Lung disease.  Smoking.  Poor nutrition.  Contamination during surgery.  Medical problems that make it harder to heal, such as diabetes or autoimmune diseases. What are the signs or symptoms? Symptoms of this condition include:  Bleeding or drainage from the wound.  Pain.  Fever.  The wound starting to break open. How is this diagnosed? Your health care provider may diagnose wound dehiscence by monitoring the incision and noting any changes in the wound. These changes can include a gap in the incision and an increase in drainage or pain. The health care provider may ask you if you have noticed any stretching or tearing of the wound. You may also have tests, including:  Wound culture tests to determine if there is an infection.  Imaging studies, such as an MRI scan or CT scan, to determine if there is a collection of pus or fluid in the wound area.  Blood tests to check for infection and inflammation. How is this treated? Treatment for this condition may include:  Wound care to keep the incision clean and help it heal from the inside out.  Surgical repair.  Antibiotic medicine to treat or prevent infection.  Medicines to reduce pain and swelling. Follow these instructions at home: Medicines  Take over-the-counter and prescription medicines only as told by your health care provider. Taking pain medicine 30 minutes before changing a bandage (dressing) can help relieve pain.  If you were prescribed an antibiotic, take it as  told by your health care provider. Do not stop taking the antibiotic even if you start to feel better.  Use anti-itch medicine as told by your health care provider. The wound may feel itchy when it is healing. Wound care   Follow instructions from your health care provider  about how to take care of your wound. Make sure you: ? Wash your hands with soap and water before you change your bandage (dressing) or wash the wound area. If soap and water are not available, use hand sanitizer. ? Gently wash the wound area with mild soap and water 2 times a day, or as directed. Rinse off the soap. Pat the area dry with a clean towel. Do not rub the wound. ? Change the dressing and the packing inside as told by your health care provider.  Do not scratch or pick at the wound.  Check your wound area every day for signs of infection. Check for: ? More redness, swelling, or pain. ? More fluid or blood. ? Warmth. ? Pus or a bad smell. Activity  Avoid exercises that make you sweat heavily or could cause stretching of your wound.  Do not lift anything that is heavier than 10 lb (4.5 kg) until the wound is healed or until your health care provider says that it is safe. General instructions  Do not take baths, swim, or use a hot tub until your health care provider approves. You may take showers.  Keep all follow-up visits as told by your health care provider. This is important. Contact a health care provider if:  Your wound does not seem to be healing properly.  You have a fever. Get help right away if:  You have more redness, swelling, or pain around your wound.  You have more fluid coming from your wound.  Your wound feels warm to the touch.  You have pus or a bad smell coming from your wound.  Your wound breaks open farther.  You have redness streaking or spreading from your wound.  You have excessive bleeding from your wound. Summary  Wound dehiscence is when a surgical incision breaks open and does not heal properly after surgery.  Your health care provider may diagnose wound dehiscence by monitoring the incision and noting any changes in the wound.  Follow instructions from your health care provider about how to take care of your wound.  Check your  wound area every day for signs of infection, such as redness, swelling, pain, fluid, blood, warmth, pus, or a bad smell.  If you were prescribed antibiotic medicine, take it as told by your health care provider. Do not stop taking the antibiotic even if you start to feel better. This information is not intended to replace advice given to you by your health care provider. Make sure you discuss any questions you have with your health care provider. Document Released: 10/27/2003 Document Revised: 07/11/2016 Document Reviewed: 07/11/2016 Elsevier Interactive Patient Education  2019 [employer].

## 2018-09-16 NOTE — Brief Op Note (Signed)
09/16/2018  12:06 PM  PATIENT:  Amanda [name]  38 y.o. female  PRE-OPERATIVE DIAGNOSIS:  Episiotomy Repair Undesired Fertility  POST-OPERATIVE DIAGNOSIS:  Episiotomy Repair Undesired Fertility  PROCEDURE:  Procedure(s): EPISIOTOMY REVISION (N/A) INTRAUTERINE DEVICE (IUD) INSERTION (N/A)  SURGEON:  Surgeon(s) and Role:    * Lavonia Drafts, MD - Primary  ANESTHESIA:   general  EBL:  1 mL   BLOOD ADMINISTERED:none  DRAINS: none   LOCAL MEDICATIONS USED:  NONE  SPECIMEN:  No Specimen  DISPOSITION OF SPECIMEN:  N/A  COUNTS:  YES  TOURNIQUET:  * No tourniquets in log *  DICTATION: .Note written in EPIC  PLAN OF CARE: Discharge to home after PACU  PATIENT DISPOSITION:  PACU - hemodynamically stable.   Delay start of Pharmacological VTE agent (>24hrs) due to surgical blood loss or risk of bleeding: yes  Complications: none immediate  Leonette Tischer L. Harraway-Smith, M.D., Cherlynn June

## 2018-09-18 ENCOUNTER — Encounter (HOSPITAL_COMMUNITY): Payer: Self-pay | Admitting: Obstetrics & Gynecology

## 2018-09-23 ENCOUNTER — Encounter: Payer: Self-pay | Admitting: Neurology

## 2018-09-23 ENCOUNTER — Ambulatory Visit (INDEPENDENT_AMBULATORY_CARE_PROVIDER_SITE_OTHER): Payer: Medicare (Managed Care) | Admitting: Neurology

## 2018-09-23 VITALS — BP 106/80 | HR 78 | Ht 68.0 in | Wt 183.0 lb

## 2018-09-23 DIAGNOSIS — R569 Unspecified convulsions: Secondary | ICD-10-CM | POA: Diagnosis not present

## 2018-09-23 MED ORDER — LEVETIRACETAM 750 MG PO TABS: 750.0000 mg | ORAL_TABLET | Freq: Two times a day (BID) | ORAL | 4 refills | Status: DC
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
## 2018-09-23 NOTE — Patient Instructions (Signed)
Continue Medications  Seizure, Adult A seizure is a sudden burst of abnormal electrical activity in the brain. The abnormal activity temporarily interrupts normal brain function, causing a person to experience any of the following:  Involuntary movements.  Changes in awareness or consciousness.  Uncontrollable shaking (convulsions). Seizures usually last from 30 seconds to 2 minutes. They usually do not cause permanent brain damage unless they are prolonged. What are the causes? This condition may be caused by:  Fever.  Low blood sugar.  Medicine.  Illness.  Brain injury.  Brain tumor.  Stroke.  A condition that is passed from parent to child (genetic).  Addiction to a substance (substanceuse disorder) or suddenly stopping the use of a substance (withdrawal). Some people who have a seizure never have another one. People who have repeated seizures have a condition called epilepsy. What are the signs or symptoms? Symptoms of this condition vary greatly from person to person. They include:  Convulsions.  Stiffening of the body.  Involuntary movements of the arms or legs.  Loss of consciousness.  Breathing problems.  Falling suddenly.  Confusion.  Head nodding.  Eye blinking or fluttering.  Lip smacking or tongue biting.  Drooling.  Rapid eye movements.  Grunting.  Loss of bladder control and bowel control.  Staring.  Unresponsiveness. Some people have symptoms right before a seizure happens (aura) and right after a seizure happens.  Symptoms that may occur before a seizure include: ? Fear or anxiety. ? Nausea. ? Feeling like the room is spinning (vertigo). ? A feeling of having seen or heard something before (dj vu). ? Odd tastes or smells. ? Changes in vision, such as seeing flashing lights or spots.  Symptoms that may occur after a seizure include: ? Confusion. ? Sleepiness. ? Headache. ? Weakness on one side of the body. How is this  diagnosed? This condition may be diagnosed based on medical history and physical exam. You may also have other tests, including:  Blood tests.  Electroencephalogram, EEG.  CT scan.  MRI.  Spinal tap (lumbar puncture). How is this treated? Most seizures will stop on their own in under 5 minutes and no treatment is needed. Seizures lasting longer than 5 minutes will usually need treatment. Treatment includes:  Medicines given through IV.  Avoiding known triggers, such as medicines that you take for another condition.  Medicines to treat epilepsy (antiepileptics), if epilepsy caused your seizures.  Surgery to stop seizures, if you have epilepsy that does not respond to medicines. Follow these instructions at home: Medicines  Take over-the-counter and prescription medicines only as told by your health care provider.  Avoid any substances that may prevent your medicine from working properly, such as alcohol. Activity  Do not drive, swim, or do any other activities that would be dangerous if you had another seizure. Wait until your health care provider says it is safe to do them.  If you live in the [place] check with your local DMV (department of motor vehicles) to find out about local driving laws. Each state has specific rules about when you can legally return to driving.  Get enough rest. Lack of sleep can make seizures more likely to occur. Educating others Teach friends and family what to do if you have a seizure. They should:  Lay you on the ground to prevent a fall.  Cushion your head and body.  Loosen any tight clothing around your neck.  Turn you on your side. If vomiting occurs, this helps keep your  airway clear.  Not hold you down. Holding you down will not stop the seizure.  Not put anything into your mouth.  Know whether or not you need emergency care.  Stay with you until you recover.  General instructions  Contact your health care provider each time  you have a seizure.  Avoid anything that has ever triggered a seizure for you.  Keep a seizure diary. Record what you remember about each seizure, especially anything that might have triggered the seizure.  Keep all follow-up visits as told by your health care provider. This is important. Contact a health care provider if:  You have another seizure.  You have seizures more often.  Your seizure symptoms change.  You continue to have seizures with treatment.  You have symptoms of an infection or illness. These might increase your risk of having a seizure. Get help right away if:  You have a seizure that: ? Lasts longer than 5 minutes. ? Is different than previous seizures. ? Leaves you unable to speak or use a part of your body. ? Makes it harder to breathe.  You have: ? A seizure after a head injury. ? Multiple seizures in a row. ? Confusion or a severe headache right after a seizure.  You are having seizures more often.  You do not wake up immediately after a seizure.  You injure yourself during a seizure. These symptoms may represent a serious problem that is an emergency. Do not wait to see if the symptoms will go away. Get medical help right away. Call your local emergency services (911 in the [place] Do not drive yourself to the hospital. Summary  Seizures are caused by abnormal electrical activity in the brain. The activity disrupts normal brain function, leading to a change in consciousness, abnormal movements, or convulsions.  There are many causes of seizures including illnesses, medicines, genetic conditions, head injuries, strokes, tumors, substance abuse, or substance withdrawal.  Most seizures will stop on their own in under 5 minutes. Seizures lasting longer than 5 minutes are a medical emergency and require immediate treatment.  There are many medicines that are used to treat seizures. Take over-the-counter and prescription medicines only as told by your  health care provider. This information is not intended to replace advice given to you by your health care provider. Make sure you discuss any questions you have with your health care provider. Document Released: 08/03/2000 Document Revised: 09/12/2017 Document Reviewed: 09/12/2017 Elsevier Interactive Patient Education  2019 [employer].

## 2018-09-25 LAB — LEVETIRACETAM LEVEL: LEVETIRACETAM: 17.7 ug/mL (ref 10.0–40.0)

## 2018-10-09 ENCOUNTER — Ambulatory Visit: Payer: Self-pay | Admitting: Obstetrics & Gynecology

## 2018-11-10 ENCOUNTER — Inpatient Hospital Stay (HOSPITAL_COMMUNITY)
Admission: AD | Admit: 2018-11-10 | Discharge: 2018-11-13 | DRG: 885 | Disposition: A | Discharge status: Home / Self Care | Payer: Medicare (Managed Care) | Source: Intra-hospital | Attending: Psychiatry | Admitting: Psychiatry

## 2018-11-10 ENCOUNTER — Encounter (HOSPITAL_COMMUNITY): Payer: Self-pay | Admitting: Emergency Medicine

## 2018-11-10 ENCOUNTER — Other Ambulatory Visit: Payer: Self-pay

## 2018-11-10 ENCOUNTER — Encounter (HOSPITAL_COMMUNITY): Payer: Self-pay

## 2018-11-10 ENCOUNTER — Emergency Department (HOSPITAL_COMMUNITY)
Admission: EM | Admit: 2018-11-10 | Discharge: 2018-11-10 | Disposition: A | Discharge status: Other Acute Inpatient Hospital | Payer: Medicare (Managed Care) | Attending: Emergency Medicine | Admitting: Emergency Medicine

## 2018-11-10 DIAGNOSIS — Z046 Encounter for general psychiatric examination, requested by authority: Secondary | ICD-10-CM | POA: Insufficient documentation

## 2018-11-10 DIAGNOSIS — Z888 Allergy status to other drugs, medicaments and biological substances status: Secondary | ICD-10-CM | POA: Diagnosis not present

## 2018-11-10 DIAGNOSIS — Z915 Personal history of self-harm: Secondary | ICD-10-CM | POA: Diagnosis not present

## 2018-11-10 DIAGNOSIS — J45909 Unspecified asthma, uncomplicated: Secondary | ICD-10-CM | POA: Diagnosis present

## 2018-11-10 DIAGNOSIS — Z79899 Other long term (current) drug therapy: Secondary | ICD-10-CM | POA: Insufficient documentation

## 2018-11-10 DIAGNOSIS — Z823 Family history of stroke: Secondary | ICD-10-CM

## 2018-11-10 DIAGNOSIS — Z833 Family history of diabetes mellitus: Secondary | ICD-10-CM

## 2018-11-10 DIAGNOSIS — H9191 Unspecified hearing loss, right ear: Secondary | ICD-10-CM | POA: Diagnosis present

## 2018-11-10 DIAGNOSIS — F4312 Post-traumatic stress disorder, chronic: Secondary | ICD-10-CM | POA: Diagnosis present

## 2018-11-10 DIAGNOSIS — R45851 Suicidal ideations: Secondary | ICD-10-CM | POA: Diagnosis present

## 2018-11-10 DIAGNOSIS — F603 Borderline personality disorder: Secondary | ICD-10-CM | POA: Diagnosis present

## 2018-11-10 DIAGNOSIS — Z801 Family history of malignant neoplasm of trachea, bronchus and lung: Secondary | ICD-10-CM | POA: Diagnosis not present

## 2018-11-10 DIAGNOSIS — F319 Bipolar disorder, unspecified: Secondary | ICD-10-CM | POA: Diagnosis not present

## 2018-11-10 DIAGNOSIS — Z91048 Other nonmedicinal substance allergy status: Secondary | ICD-10-CM | POA: Diagnosis not present

## 2018-11-10 DIAGNOSIS — Z811 Family history of alcohol abuse and dependence: Secondary | ICD-10-CM

## 2018-11-10 DIAGNOSIS — Z91040 Latex allergy status: Secondary | ICD-10-CM | POA: Diagnosis not present

## 2018-11-10 DIAGNOSIS — I10 Essential (primary) hypertension: Secondary | ICD-10-CM | POA: Diagnosis present

## 2018-11-10 DIAGNOSIS — Z91030 Bee allergy status: Secondary | ICD-10-CM | POA: Diagnosis not present

## 2018-11-10 DIAGNOSIS — F32A Depression, unspecified: Secondary | ICD-10-CM

## 2018-11-10 DIAGNOSIS — F17210 Nicotine dependence, cigarettes, uncomplicated: Secondary | ICD-10-CM | POA: Diagnosis not present

## 2018-11-10 DIAGNOSIS — R569 Unspecified convulsions: Secondary | ICD-10-CM | POA: Insufficient documentation

## 2018-11-10 DIAGNOSIS — F329 Major depressive disorder, single episode, unspecified: Secondary | ICD-10-CM | POA: Insufficient documentation

## 2018-11-10 DIAGNOSIS — Z9114 Patient's other noncompliance with medication regimen: Secondary | ICD-10-CM | POA: Diagnosis not present

## 2018-11-10 DIAGNOSIS — Z9102 Food additives allergy status: Secondary | ICD-10-CM | POA: Diagnosis not present

## 2018-11-10 DIAGNOSIS — Z818 Family history of other mental and behavioral disorders: Secondary | ICD-10-CM | POA: Diagnosis not present

## 2018-11-10 DIAGNOSIS — Z800 Family history of malignant neoplasm of digestive organs: Secondary | ICD-10-CM

## 2018-11-10 DIAGNOSIS — Z8249 Family history of ischemic heart disease and other diseases of the circulatory system: Secondary | ICD-10-CM | POA: Diagnosis not present

## 2018-11-10 DIAGNOSIS — G40909 Epilepsy, unspecified, not intractable, without status epilepticus: Secondary | ICD-10-CM | POA: Diagnosis present

## 2018-11-10 DIAGNOSIS — Z91013 Allergy to seafood: Secondary | ICD-10-CM | POA: Diagnosis not present

## 2018-11-10 DIAGNOSIS — R202 Paresthesia of skin: Secondary | ICD-10-CM | POA: Insufficient documentation

## 2018-11-10 DIAGNOSIS — Z975 Presence of (intrauterine) contraceptive device: Secondary | ICD-10-CM

## 2018-11-10 DIAGNOSIS — F332 Major depressive disorder, recurrent severe without psychotic features: Secondary | ICD-10-CM | POA: Diagnosis present

## 2018-11-10 DIAGNOSIS — G4700 Insomnia, unspecified: Secondary | ICD-10-CM | POA: Diagnosis present

## 2018-11-10 DIAGNOSIS — Z590 Homelessness: Secondary | ICD-10-CM | POA: Diagnosis not present

## 2018-11-10 LAB — CBC
HCT: 45.4 % (ref 36.0–46.0)
Hemoglobin: 13.8 g/dL (ref 12.0–15.0)
MCH: 25.2 pg — ABNORMAL LOW (ref 26.0–34.0)
MCHC: 30.4 g/dL (ref 30.0–36.0)
MCV: 82.8 fL (ref 80.0–100.0)
Platelets: 405 10*3/uL — ABNORMAL HIGH (ref 150–400)
RBC: 5.48 MIL/uL — ABNORMAL HIGH (ref 3.87–5.11)
RDW: 15.3 % (ref 11.5–15.5)
WBC: 12.4 10*3/uL — ABNORMAL HIGH (ref 4.0–10.5)
nRBC: 0 % (ref 0.0–0.2)

## 2018-11-10 LAB — COMPREHENSIVE METABOLIC PANEL
ALT: 17 U/L (ref 0–44)
AST: 17 U/L (ref 15–41)
Albumin: 3.9 g/dL (ref 3.5–5.0)
Alkaline Phosphatase: 99 U/L (ref 38–126)
Anion gap: 8 (ref 5–15)
BUN: 13 mg/dL (ref 6–20)
CO2: 26 mmol/L (ref 22–32)
Calcium: 9.4 mg/dL (ref 8.9–10.3)
Chloride: 105 mmol/L (ref 98–111)
Creatinine, Ser: 0.66 mg/dL (ref 0.44–1.00)
GFR calc Af Amer: >60 mL/min (ref >60)
GFR calc non Af Amer: >60 mL/min (ref >60)
Glucose, Bld: 93 mg/dL (ref 70–99)
Potassium: 4 mmol/L (ref 3.5–5.1)
Sodium: 139 mmol/L (ref 135–145)
Total Bilirubin: 0.6 mg/dL (ref 0.3–1.2)
Total Protein: 6.7 g/dL (ref 6.5–8.1)

## 2018-11-10 LAB — I-STAT BETA HCG BLOOD, ED (MC, WL, AP ONLY): I-stat hCG, quantitative: <5 m[IU]/mL (ref <5)

## 2018-11-10 LAB — RAPID URINE DRUG SCREEN, HOSP PERFORMED
Amphetamines: NOT DETECTED
Barbiturates: NOT DETECTED
Benzodiazepines: NOT DETECTED
Cocaine: NOT DETECTED
Opiates: NOT DETECTED
Tetrahydrocannabinol: NOT DETECTED

## 2018-11-10 LAB — SALICYLATE LEVEL: Salicylate Lvl: <7 mg/dL (ref 2.8–30.0)

## 2018-11-10 LAB — ETHANOL: Alcohol, Ethyl (B): <10 mg/dL (ref <10)

## 2018-11-10 LAB — ACETAMINOPHEN LEVEL: Acetaminophen (Tylenol), Serum: <10 ug/mL — ABNORMAL LOW (ref 10–30)

## 2018-11-10 MED ORDER — ACETAMINOPHEN 325 MG PO TABS
650.0000 mg | ORAL_TABLET | Freq: Four times a day (QID) | ORAL | Status: DC | PRN
  Administered 2018-11-11: 650 mg via ORAL
  Filled 2018-11-10: qty 2

## 2018-11-10 MED ORDER — NICOTINE 21 MG/24HR TD PT24
21.0000 mg | MEDICATED_PATCH | Freq: Every day | TRANSDERMAL | Status: DC
  Administered 2018-11-11 – 2018-11-12 (×2): 21 mg via TRANSDERMAL
  Filled 2018-11-10 (×4): qty 1

## 2018-11-10 MED ORDER — TRAZODONE HCL 50 MG PO TABS
50.0000 mg | ORAL_TABLET | Freq: Every evening | ORAL | Status: DC | PRN
  Administered 2018-11-10: 50 mg via ORAL

## 2018-11-10 MED ORDER — LEVETIRACETAM 750 MG PO TABS
750.0000 mg | ORAL_TABLET | Freq: Two times a day (BID) | ORAL | Status: DC
  Administered 2018-11-10: 750 mg via ORAL
  Filled 2018-11-10: qty 1

## 2018-11-10 MED ORDER — MAGNESIUM HYDROXIDE 400 MG/5ML PO SUSP: 30.0000 mL | Freq: Every day | ORAL | Status: DC | PRN

## 2018-11-10 MED ORDER — ALUM & MAG HYDROXIDE-SIMETH 200-200-20 MG/5ML PO SUSP: 30.0000 mL | ORAL | Status: DC | PRN
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
## 2018-11-10 NOTE — Progress Notes (Signed)
Amanda [name] is a 38 year old female being admitted voluntarily to [facility] from [facility].  She came to the ED with seizures and worsening depression with suicidal ideation.  During [facility] admission, she denied SI or any self injurious behaviors.  She has been off her psychiatric medications since she found out that she was pregnant over a year ago.  She reports auditory hallucinations telling her that she doesn't deserve to have her baby, that she is a horrible mother and that people don't want her around.  She denied HI or visual hallucinations.  She does report multiple psychiatric admission (16 in the past) and long history with PTSD/Borderline personality disorder with history of cutting.  Recent stressors as being homeless, living with friends and her 48 month old child was taken in to CPS (5 days after birth) custody due to her and her fiance being homeless/poor living conditions.  Recently she hasn't been able to see her child (weekly 3 hour supervised visits) because of the COVID concerns and "he is what keeps me going."  She reported history of seizures being treated with Keppra and Rheumatoid arthritis.  Oriented her to the unit.  Admission paperwork completed and signed.  Belongings searched and secured in locker #13, no contraband found.  Skin assessment completed and no skin issues noted.  Q 15 minute checks initiated for safety.  We will continue to monitor the progress towards her goals.

## 2018-11-10 NOTE — ED Notes (Signed)
Report called to [facility] nurse for 502-1

## 2018-11-10 NOTE — ED Notes (Signed)
micheal  ( [facility]) [phone]

## 2018-11-10 NOTE — ED Triage Notes (Addendum)
Pt states has been having seizures - does not have any medicine, states that roommate threw it out. Also having headache

## 2018-11-10 NOTE — ED Notes (Signed)
[facility] called for transport to [facility] 502-1

## 2018-11-10 NOTE — BHH Counselor (Signed)
Patient has been accepted to [facility] 502-1 at 2000. Patient's RN, Jinny Blossom has been informed.

## 2018-11-10 NOTE — Progress Notes (Addendum)
Pt accepted to [facility], room 502-1 Ricky Ala, NP is the accepting provider.   Dr. Jake Samples is the attending provider.   Call report to [phone]   RN @ [facility] ED notified.    Pt is voluntary and can be transported by [facility].   Pt is scheduled to arrive at [facility] [place], [place], [place] Disposition CSW [facility] BHH/TTS [phone]

## 2018-11-10 NOTE — ED Notes (Signed)
Belongings x 2 bags from locker # 1 and valuables envelope with silver wallet (no money) given to [employer] driver for transport to [facility]

## 2018-11-10 NOTE — ED Notes (Signed)
Pt in maroon scrubs, 2 bags on belongings inventoried and stored in locker #1, silver wallet placed in security. Pt signed all forms in medical clearance packet. TTS in process.

## 2018-11-10 NOTE — Tx Team (Signed)
Initial Treatment Plan 11/10/2018 11:58 PM Santana Gosdin [id number]    PATIENT STRESSORS: Financial difficulties Health problems Other: 31 month old son in [place] custody   PATIENT STRENGTHS: Active sense of humor Communication skills General fund of knowledge Motivation for treatment/growth   PATIENT IDENTIFIED PROBLEMS: Depression  Suicidal ideation  Psychosis  "Get back on my meds"  "Get back at my baseline"  "Get my child back"           DISCHARGE CRITERIA:  Improved stabilization in mood, thinking, and/or behavior Need for constant or close observation no longer present Reduction of life-threatening or endangering symptoms to within safe limits Verbal commitment to aftercare and medication compliance  PRELIMINARY DISCHARGE PLAN: Outpatient therapy Medication management  PATIENT/FAMILY INVOLVEMENT: This treatment plan has been presented to and reviewed with the patient, Leotha Westermeyer.  The patient and family have been given the opportunity to ask questions and make suggestions.  Windell Moment, RN 11/10/2018, 11:58 PM

## 2018-11-11 DIAGNOSIS — F319 Bipolar disorder, unspecified: Secondary | ICD-10-CM

## 2018-11-11 LAB — HEMOGLOBIN A1C
Hgb A1c MFr Bld: 5.3 % (ref 4.8–5.6)
Mean Plasma Glucose: 105.41 mg/dL

## 2018-11-11 LAB — TSH: TSH: 2.786 u[IU]/mL (ref 0.350–4.500)

## 2018-11-11 MED ORDER — TEMAZEPAM 15 MG PO CAPS
30.0000 mg | ORAL_CAPSULE | Freq: Every day | ORAL | Status: DC
  Administered 2018-11-11 – 2018-11-12 (×2): 30 mg via ORAL
  Filled 2018-11-11 (×2): qty 2

## 2018-11-11 MED ORDER — LEVETIRACETAM 250 MG PO TABS
750.0000 mg | ORAL_TABLET | Freq: Two times a day (BID) | ORAL | Status: DC
  Administered 2018-11-11 – 2018-11-13 (×5): 750 mg via ORAL
  Filled 2018-11-11 (×7): qty 3

## 2018-11-11 MED ORDER — BUSPIRONE HCL 15 MG PO TABS
15.0000 mg | ORAL_TABLET | Freq: Three times a day (TID) | ORAL | Status: DC
  Administered 2018-11-11 – 2018-11-13 (×7): 15 mg via ORAL
  Filled 2018-11-11 (×9): qty 1

## 2018-11-11 MED ORDER — FLUOXETINE HCL 20 MG PO CAPS
20.0000 mg | ORAL_CAPSULE | Freq: Every day | ORAL | Status: DC
  Administered 2018-11-11 – 2018-11-13 (×3): 20 mg via ORAL
  Filled 2018-11-11 (×4): qty 1
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
## 2018-11-11 NOTE — Progress Notes (Signed)
Recreation Therapy Notes  INPATIENT RECREATION THERAPY ASSESSMENT  Patient Details Name: Amanda [name] MRN: [id number] DOB: 04/23/1981 Today's Date: 11/11/2018       Information Obtained From: Patient  Able to Participate in Assessment/Interview: Yes  Patient Presentation: Hyperverbal  Reason for Admission (Per Patient): Other (Comments)(Depression)  Patient Stressors: Other (Comment)(88 month old son is in foster care, pt unable to see him because of the COVID-19 virus; Not having stable housing)  Coping Skills:   Isolation, Self-Injury, Journal, Sports, TV, Arguments, Aggression, Music, Exercise, Meditate, Deep Breathing, Impulsivity, Talk, Prayer, Art, Other (Comment), Read(Cooking)  Leisure Interests (2+):  Individual - Writing, Art - Coloring, Art - Paint, Art - Draw, Crafts - Other (Comment)(Poetry; Special projects using wax)  Frequency of Recreation/Participation: Other (Comment)(Daily)  Awareness of Community Resources:  Yes  Community Resources:  [place], Other (Comment)(Book store)  Current Use: Yes  If no, Barriers?:    Expressed Interest in [place]: No  [employer] of Residence:  Investment banker, corporate  Patient Main Form of Transportation: Diplomatic Services operational officer  Patient Strengths:  Support system; Engineer, site  Patient Identified Areas of Improvement:  Temper; Pay attention to surroundings when writing  Patient Goal for Hospitalization:  "get back on medication, get back to baseline and connect with support system"  Current SI (including self-harm):  No  Current HI:  No  Current AVH: Yes(Voice telling her she is a horrible mom and the worse thing she could do is get her child back.)  Staff Intervention Plan: Group Attendance, Collaborate with Interdisciplinary Treatment Team  Consent to Intern Participation: N/A     Victorino Sparrow, LRT/CTRS  Ria Comment, Kaydan Wong A 11/11/2018, 1:22 PM
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
## 2018-11-11 NOTE — Progress Notes (Signed)
Recreation Therapy Notes  Date: 3.24.20 Time: 1000 Location: 500 Hall Dayroom  Group Topic: Time Management, Team Building, Problem Solving  Goal Area(s) Addresses:  Patient will effectively work with peer towards shared goal.  Patient will identify skill used to make activity successful.  Patient will identify how skills used during activity can be used to reach post d/c goals.   Behavioral Response: Engaged  Intervention: STEM Activity   Activity: In team's, using 20 plastic straws and masking tape, patients were to build a free standing bridge that would hold a small paperback book.  Education: Education officer, community, Dentist.   Education Outcome: Acknowledges education/In group clarification offered/Needs additional education.   Clinical Observations/Feedback:  Pt worked well with her peer.  Pt took on the leadership role in her group.  Pt listened to any suggestions her peers may have had and she made suggestions as well.  Pt was bright and social not only with her peer but the rest of the group as well.  Pt explained with her support system there is one person she doesn't like to tell to much to because of their age and not wanting to overwhelm them but still she's that person as an intricate part of her support system    Erlin Gardella Ria Comment, LRT/CTRS    Victorino Sparrow A 11/11/2018 11:04 AM

## 2018-11-11 NOTE — BHH Group Notes (Signed)
[facility] LCSW Group Therapy Note  Date/Time: 11/11/18, 1115  Type of Therapy/Topic:  Group Therapy:  Feelings about Diagnosis  Participation Level:  Active   Mood:pleasant   Description of Group:    This group will allow patients to explore their thoughts and feelings about diagnoses they have received. Patients will be guided to explore their level of understanding and acceptance of these diagnoses. Facilitator will encourage patients to process their thoughts and feelings about the reactions of others to their diagnosis, and will guide patients in identifying ways to discuss their diagnosis with significant others in their lives. This group will be process-oriented, with patients participating in exploration of their own experiences as well as giving and receiving support and challenge from other group members.   Therapeutic Goals: 1. Patient will demonstrate understanding of diagnosis as evidence by identifying two or more symptoms of the disorder:  2. Patient will be able to express two feelings regarding the diagnosis 3. Patient will demonstrate ability to communicate their needs through discussion and/or role plays  Summary of Patient Progress:Pt attentive and engaged in group discussion.  Pt did not share her diagnosis but was able to identify symptoms of both schizophrenia and bipolar disorder.        Therapeutic Modalities:   Cognitive Behavioral Therapy Brief Therapy Feelings Identification   Lurline Idol, LCSW

## 2018-11-11 NOTE — Progress Notes (Signed)
Patient denies SI, HI, and AVH this shift.  Patient has been compliant with medications and engaged in unit activities.  Patient has had no incident of behavioral dyscontrol this shift.   Assess patient for safety, offer medications as prescribed, engage patient in 1:1 staff talks. Patient able to contract for safety.   Continue to monitor as planned. Patient able to contract for safety.

## 2018-11-12 LAB — PROLACTIN: Prolactin: 36.9 ng/mL — ABNORMAL HIGH (ref 4.8–23.3)

## 2018-11-12 MED ORDER — PRAZOSIN HCL 2 MG PO CAPS
4.0000 mg | ORAL_CAPSULE | Freq: Every day | ORAL | Status: DC
  Administered 2018-11-12: 4 mg via ORAL
  Filled 2018-11-12 (×2): qty 2

## 2018-11-12 NOTE — Progress Notes (Signed)
Adult Psychoeducational Group Note  Date:  11/12/2018 Time:  9:01 PM  Group Topic/Focus:  Wrap-Up Group:   The focus of this group is to help patients review their daily goal of treatment and discuss progress on daily workbooks.  Participation Level:  Active  Participation Quality:  Appropriate  Affect:  Appropriate  Cognitive:  Appropriate  Insight: Appropriate  Engagement in Group:  Engaged  Modes of Intervention:  Discussion  Additional Comments: The patient expressed that she rates today a 8.The patient also said that she attended groups.  Nash Shearer 11/12/2018, 9:01 PM

## 2018-11-12 NOTE — Progress Notes (Signed)
Patient ID: Amanda [name], female   DOB: 06-15-1981, 38 y.o.   MRN: [id number]  D: Patient pleasant on approach tonight. Hypomanic when speaking with her. Hyperverbal and told undersigned about a lot of her history. Reports mood has improved from yesterday and denies any SI. Talking frequently about her child. A: Staff will monitor on q 15 minute checks, follow treatment plan, and give medications as ordered. R: Cooperative on the unit.

## 2018-11-12 NOTE — Plan of Care (Signed)
  Problem: Activity: Goal: Interest or engagement in activities will improve Outcome: Progressing   D: Pt alert and oriented on the unit. Pt engaging with RN staff and other pts. Pt denies SI/HI, A/VH. Pt participated during unit groups and activities and is pleasant and cooperative. A: Education, support and encouragement provided, q15 minute safety checks remain in effect. Medications administered per MD orders. R: No reactions/side effects to medicine noted. Pt denies any concerns at this time, and verbally contracts for safety. Pt ambulating on the unit with no issues. Pt remains safe on the unit.
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
## 2018-11-12 NOTE — Progress Notes (Signed)
Amanda [name] attended wrap-up group. Pt appears manic/anxious/animated in affect and mood. Pt remains tangential in thought process. Pt denies SI/HI/AVH/Pain at this time. Pt c/o of night terrors; minipress was started HS. Pt talked about how she misses her child and hopes to arrange a visit upon d/c. Support and encouragement provided. Will continue with POC.
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
## 2018-11-12 NOTE — Tx Team (Signed)
Interdisciplinary Treatment and Diagnostic Plan Update  11/12/2018 Time of Session: 10:00am Amanda [name] MRN: [id number]  Principal Diagnosis: <principal problem not specified>  Secondary Diagnoses: Active Problems:   Bipolar 1 disorder, depressed (HCC)   Current Medications:  Current Facility-Administered Medications  Medication Dose Route Frequency Provider Last Rate Last Dose  . acetaminophen (TYLENOL) tablet 650 mg  650 mg Oral Q6H PRN Derrill Center, NP   650 mg at 11/11/18 0813  . alum & mag hydroxide-simeth (MAALOX/MYLANTA) 200-200-20 MG/5ML suspension 30 mL  30 mL Oral Q4H PRN Derrill Center, NP      . busPIRone (BUSPAR) tablet 15 mg  15 mg Oral TID Johnn Hai, MD   15 mg at 11/12/18 0820  . FLUoxetine (PROZAC) capsule 20 mg  20 mg Oral Daily Johnn Hai, MD   20 mg at 11/12/18 0820  . levETIRAcetam (KEPPRA) tablet 750 mg  750 mg Oral BID Johnn Hai, MD   750 mg at 11/12/18 0820  . magnesium hydroxide (MILK OF MAGNESIA) suspension 30 mL  30 mL Oral Daily PRN Derrill Center, NP      . nicotine (NICODERM CQ - dosed in mg/24 hours) patch 21 mg  21 mg Transdermal Daily Johnn Hai, MD   21 mg at 11/12/18 0820  . prazosin (MINIPRESS) capsule 4 mg  4 mg Oral QHS Johnn Hai, MD      . temazepam (RESTORIL) capsule 30 mg  30 mg Oral QHS Johnn Hai, MD   30 mg at 11/11/18 2135  . traZODone (DESYREL) tablet 50 mg  50 mg Oral QHS PRN Derrill Center, NP   50 mg at 11/10/18 2230   PTA Medications: Medications Prior to Admission  Medication Sig Dispense Refill Last Dose  . acetaminophen (TYLENOL) 500 MG tablet Take 2 tablets (1,000 mg total) by mouth every 6 (six) hours as needed for moderate pain or headache (for pain scale < 4). 60 tablet 2 Past Month at Unknown time  . albuterol (PROVENTIL HFA;VENTOLIN HFA) 108 (90 Base) MCG/ACT inhaler Inhale 2 puffs into the lungs every 4 (four) hours as needed for wheezing or shortness of breath.   Past Month at Unknown time  .  butalbital-acetaminophen-caffeine (FIORICET, ESGIC) 50-325-40 MG tablet Take 2 tablets by mouth every 6 (six) hours as needed for headache. 14 tablet 0 Past Month at Unknown time  . cyclobenzaprine (FLEXERIL) 10 MG tablet Take 1 tablet (10 mg total) by mouth 2 (two) times daily as needed for muscle spasms. 20 tablet 0 Past Month at Unknown time  . labetalol (NORMODYNE) 200 MG tablet Take 1 tablet (200 mg total) by mouth 3 (three) times daily. 90 tablet 3 Past Month at Unknown time  . levETIRAcetam (KEPPRA) 750 MG tablet Take 1 tablet (750 mg total) by mouth 2 (two) times daily. 180 tablet 4 Past Month at Unknown time  . EPINEPHrine 0.3 mg/0.3 mL IJ SOAJ injection Inject 0.3 mg into the muscle as needed for anaphylaxis.    Taking    Patient Stressors: Financial difficulties Health problems Other: 12 month old son in CPS custody  Patient Strengths: Active sense of humor Communication skills General fund of knowledge Motivation for treatment/growth  Treatment Modalities: Medication Management, Group therapy, Case management,  1 to 1 session with clinician, Psychoeducation, Recreational therapy.   Physician Treatment Plan for Primary Diagnosis: <principal problem not specified> Long Term Goal(s): Improvement in symptoms so as ready for discharge Improvement in symptoms so as ready for discharge  Short Term Goals: Ability to demonstrate self-control will improve Ability to identify and develop effective coping behaviors will improve Ability to maintain clinical measurements within normal limits will improve Ability to demonstrate self-control will improve Ability to identify and develop effective coping behaviors will improve Ability to maintain clinical measurements within normal limits will improve  Medication Management: Evaluate patient's response, side effects, and tolerance of medication regimen.  Therapeutic Interventions: 1 to 1 sessions, Unit Group sessions and Medication  administration.  Evaluation of Outcomes: Progressing  Physician Treatment Plan for Secondary Diagnosis: Active Problems:   Bipolar 1 disorder, depressed ([place])  Long Term Goal(s): Improvement in symptoms so as ready for discharge Improvement in symptoms so as ready for discharge   Short Term Goals: Ability to demonstrate self-control will improve Ability to identify and develop effective coping behaviors will improve Ability to maintain clinical measurements within normal limits will improve Ability to demonstrate self-control will improve Ability to identify and develop effective coping behaviors will improve Ability to maintain clinical measurements within normal limits will improve     Medication Management: Evaluate patient's response, side effects, and tolerance of medication regimen.  Therapeutic Interventions: 1 to 1 sessions, Unit Group sessions and Medication administration.  Evaluation of Outcomes: Progressing   RN Treatment Plan for Primary Diagnosis: <principal problem not specified> Long Term Goal(s): Knowledge of disease and therapeutic regimen to maintain health will improve  Short Term Goals: Ability to participate in decision making will improve, Ability to verbalize feelings will improve, Ability to identify and develop effective coping behaviors will improve and Compliance with prescribed medications will improve  Medication Management: RN will administer medications as ordered by provider, will assess and evaluate patient's response and provide education to patient for prescribed medication. RN will report any adverse and/or side effects to prescribing provider.  Therapeutic Interventions: 1 on 1 counseling sessions, Psychoeducation, Medication administration, Evaluate responses to treatment, Monitor vital signs and CBGs as ordered, Perform/monitor CIWA, COWS, AIMS and Fall Risk screenings as ordered, Perform wound care treatments as ordered.  Evaluation of  Outcomes: Progressing   LCSW Treatment Plan for Primary Diagnosis: <principal problem not specified> Long Term Goal(s): Safe transition to appropriate next level of care at discharge, Engage patient in therapeutic group addressing interpersonal concerns.  Short Term Goals: Engage patient in aftercare planning with referrals and resources, Increase social support, Increase emotional regulation, Identify triggers associated with mental health/substance abuse issues and Increase skills for wellness and recovery  Therapeutic Interventions: Assess for all discharge needs, 1 to 1 time with Social worker, Explore available resources and support systems, Assess for adequacy in community support network, Educate family and significant other(s) on suicide prevention, Complete Psychosocial Assessment, Interpersonal group therapy.  Evaluation of Outcomes: Progressing   Progress in Treatment: Attending groups: Yes. Participating in groups: Yes. Taking medication as prescribed: Yes. Toleration medication: Yes. Family/Significant other contact made: No, will contact:  fiancee Patient understands diagnosis: Yes. Discussing patient identified problems/goals with staff: Yes. Medical problems stabilized or resolved: Yes. Denies suicidal/homicidal ideation: Yes. Issues/concerns per patient self-inventory: Yes.  New problem(s) identified: Yes, Describe:  homeless, infant son in [place] custody due to homelessness  New Short Term/Long Term Goal(s): medication management for mood stabilization; elimination of SI thoughts; development of comprehensive mental wellness/sobriety plan.  Patient Goals:  Medication management  Discharge Plan or Barriers: CSW continuing to assess for appropriate referrals, patient interested in following up with [facility] [place] for outpatient care.   Reason for Continuation of Hospitalization:  Anxiety Depression [place] Medication stabilization  Estimated Length of  Stay: 3-5 days  Attendees: Patient: Amanda [name] 11/12/2018 11:02 AM  Physician:  11/12/2018 11:02 AM  Nursing:  11/12/2018 11:02 AM  RN Care Manager: 11/12/2018 11:02 AM  Social Worker: Stephanie Acre, [place] 11/12/2018 11:02 AM  Recreational Therapist:  11/12/2018 11:02 AM  Other:  11/12/2018 11:02 AM  Other:  11/12/2018 11:02 AM  Other: 11/12/2018 11:02 AM    Scribe for Treatment Team: Joellen [place], [place] 11/12/2018 11:02 AM

## 2018-11-13 DIAGNOSIS — F332 Major depressive disorder, recurrent severe without psychotic features: Secondary | ICD-10-CM

## 2018-11-13 MED ORDER — FLUOXETINE HCL 20 MG PO CAPS: 20.0000 mg | ORAL_CAPSULE | Freq: Every day | ORAL | 1 refills | Status: DC

## 2018-11-13 MED ORDER — BUSPIRONE HCL 15 MG PO TABS: 15.0000 mg | ORAL_TABLET | Freq: Three times a day (TID) | ORAL | 1 refills | Status: DC

## 2018-11-13 MED ORDER — PRAZOSIN HCL 2 MG PO CAPS: 4.0000 mg | ORAL_CAPSULE | Freq: Every day | ORAL | 1 refills | Status: DC

## 2018-11-13 MED ORDER — TEMAZEPAM 30 MG PO CAPS: 30.0000 mg | ORAL_CAPSULE | Freq: Every day | ORAL | 0 refills | Status: DC

## 2018-11-13 NOTE — [facility] Suicide Risk Assessment (Signed)
[facility] INPATIENT:  Family/Significant Other Suicide Prevention Education  Suicide Prevention Education:  Education Completed;Michael Arlyce Dice, [phone],  has been identified by the patient as the family member/significant other with whom the patient will be residing, and identified as the person(s) who will aid the patient in the event of a mental health crisis (suicidal ideations/suicide attempt).  With written consent from the patient, the family member/significant other has been provided the following suicide prevention education, prior to the and/or following the discharge of the patient.  The suicide prevention education provided includes the following:  Suicide risk factors  Suicide prevention and interventions  National Suicide Hotline telephone number  [facility] assessment telephone number  [facility] Emergency Assistance [place] and/or Residential Mobile Crisis Unit telephone number  Request made of family/significant other to:  Remove weapons (e.g., guns, rifles, knives), all items previously/currently identified as safety concern.  No guns, per Legrand Como.   Remove drugs/medications (over-the-counter, prescriptions, illicit drugs), all items previously/currently identified as a safety concern.  The family member/significant other verbalizes understanding of the suicide prevention education information provided.  The family member/significant other agrees to remove the items of safety concern listed above.  Joanne Chars, LCSW 11/13/2018, 10:36 AM

## 2018-11-13 NOTE — Progress Notes (Signed)
Recreation Therapy Notes  INPATIENT RECREATION TR PLAN  Patient Details Name: Amanda [name] MRN: [id number] DOB: 12-01-1980 Today's Date: 11/13/2018  Rec Therapy Plan Is patient appropriate for Therapeutic Recreation?: Yes Treatment times per week: about 3 days Estimated Length of Stay: 5-7 days TR Treatment/Interventions: Group participation (Comment)  Discharge Criteria Pt will be discharged from therapy if:: Discharged Treatment plan/goals/alternatives discussed and agreed upon by:: Patient/family  Discharge Summary Short term goals set: See patient care plan Short term goals met: Complete Progress toward goals comments: Groups attended Which groups?: Wellness, Other (Comment)(Triggers; Team building) Reason goals not met: None Therapeutic equipment acquired: N/A Reason patient discharged from therapy: Discharge from hospital Pt/family agrees with progress & goals achieved: Yes Date patient discharged from therapy: 11/13/18    Victorino Sparrow, LRT/CTRS  Ria Comment, Ruhaan Nordahl A 11/13/2018, 11:10 AM

## 2018-11-13 NOTE — Progress Notes (Signed)
  [facility] Adult Case Management Discharge Plan :  Will you be returning to the same living situation after discharge:  Yes,  with fiancee/friends At discharge, do you have transportation home?: No. Bus pass provided.  Do you have the ability to pay for your medications: No. Will work with [facility] or [facility].  Release of information consent forms completed and in the chart;  Patient's signature needed at discharge.  Patient to Follow up at: Follow-up Information    [facility] Follow up on 12/11/2018.   Specialty:  Professional Counselor Why:  Please attend your intake appointment for services on Thursday, 4/23 at 2:15p.  Be sure to bring your photo ID, proof of insurance, SSN, current medications and discharge paperwork from this hospitalization.  Contact information: [facility] [id number] [phone]        [facility] Follow up.   Why:  Hospital follow up appointment is  At this time appointments are being conducted over the phone.  Contact information: [address] [place] [address] [phone]           Next level of care provider has access to [place] and Suicide Prevention discussed: Yes,  with fiancee  Have you used any form of tobacco in the last 30 days? (Cigarettes, Smokeless Tobacco, Cigars, and/or Pipes): Yes  Has patient been referred to the Quitline?: Yes, faxed on 11/13/18  Patient has been referred for addiction treatment: N/A  Joanne Chars, [place] 11/13/2018, 3:07 PM

## 2018-11-13 NOTE — Progress Notes (Signed)
Nursing discharge note: Patient discharged home per MD order.  Patient received all personal belongings from unit and locker.  Reviewed AVS/transition record with patient and she indicates understanding.  Patient will follow up with [facility] the [place].  Patient denies any thoughts of self harm.  He/she left ambulatory with her fiance and a bus pass.

## 2018-11-13 NOTE — [facility] Suicide Risk Assessment (Signed)
[facility] Discharge Suicide Risk Assessment   Principal Problem: Admitted for the severity of depression, homelessness, general safety and evaluation Discharge Diagnoses: Active Problems:   Bipolar 1 disorder, depressed ([place])   Total Time spent with patient: 45 minutes  Alert and oriented to person place time situation affect appropriate speech normal rate and tone thoughts coherent and goal-directed no psychosis no thoughts of harming self or others contracting fully mood euthymic States medication for nightmares worked effectively as well  Mental Status Per Nursing Assessment::   On Admission:  Self-harm thoughts  Demographic Factors:  Caucasian and Low socioeconomic status  Loss Factors: Decrease in vocational status  Historical Factors: NA  Risk Reduction Factors:   Sense of responsibility to family and Religious beliefs about death  Continued Clinical Symptoms:  Dysthymia  Cognitive Features That Contribute To Risk:  None    Suicide Risk:  Minimal: No identifiable suicidal ideation.  Patients presenting with no risk factors but with morbid ruminations; may be classified as minimal risk based on the severity of the depressive symptoms    Plan Of Care/Follow-up recommendations:  Activity:  full  Starr Engel, MD 11/13/2018, 9:03 AM

## 2018-11-13 NOTE — Plan of Care (Signed)
Pt was able to identify triggers at completion of recreation therapy group sessions.   Victorino Sparrow, LRT/CTRS

## 2018-11-13 NOTE — Progress Notes (Signed)
Recreation Therapy Notes  Date: 3.26.20 Time: 1000 Location: 500 Hall Dayroom  Group Topic: Wellness  Goal Area(s) Addresses:  Patient will define components of whole wellness. Patient will verbalize benefit of whole wellness.  Behavioral Response: Engaged  Intervention:  Music   Activity:  Exercise.  LRT led the group in [name] series of stretches to loosen them up.  Patients were then given the opportunity to lead the group in an exercise/dance of their choice.  Patients were to get at least 30 minutes of exercise.  Patients could take water breaks as needed.  Education: Wellness, Dentist.   Education Outcome: Acknowledges education/In group clarification offered/Needs additional education.   Clinical Observations/Feedback:  Pt led group in [name] few dance moves.  Pt was active and appeared to enjoy the music that was played during group.  Pt was social with peers and on task.    Amanda [name], LRT/CTRS      Amanda [name], Amanda [name] 11/13/2018 10:50 AM

## 2018-11-13 NOTE — Plan of Care (Signed)
  Problem: Education: Goal: Knowledge of [place] [facility] Education information/materials will improve Outcome: Completed/Met Goal: Emotional status will improve Outcome: Completed/Met Goal: Mental status will improve Outcome: Completed/Met Goal: Verbalization of understanding the information provided will improve Outcome: Completed/Met   Problem: Activity: Goal: Interest or engagement in activities will improve Outcome: Completed/Met Goal: Sleeping patterns will improve Outcome: Completed/Met   Problem: Coping: Goal: Ability to verbalize frustrations and anger appropriately will improve Outcome: Completed/Met Goal: Ability to demonstrate self-control will improve Outcome: Completed/Met   Problem: Health Behavior/Discharge Planning: Goal: Identification of resources available to assist in meeting health care needs will improve Outcome: Completed/Met Goal: Compliance with treatment plan for underlying cause of condition will improve Outcome: Completed/Met   Problem: Physical Regulation: Goal: Ability to maintain clinical measurements within normal limits will improve Outcome: Completed/Met   Problem: Safety: Goal: Periods of time without injury will increase Outcome: Completed/Met   Problem: Education: Goal: Utilization of techniques to improve thought processes will improve Outcome: Completed/Met Goal: Knowledge of the prescribed therapeutic regimen will improve Outcome: Completed/Met   Problem: Activity: Goal: Interest or engagement in leisure activities will improve Outcome: Completed/Met Goal: Imbalance in normal sleep/wake cycle will improve Outcome: Completed/Met   Problem: Coping: Goal: Coping ability will improve Outcome: Completed/Met Goal: Will verbalize feelings Outcome: Completed/Met   Problem: Health Behavior/Discharge Planning: Goal: Ability to make decisions will improve Outcome: Completed/Met Goal: Compliance with therapeutic regimen will  improve Outcome: Completed/Met   Problem: Role Relationship: Goal: Will demonstrate positive changes in social behaviors and relationships Outcome: Completed/Met   Problem: Safety: Goal: Ability to disclose and discuss suicidal ideas will improve Outcome: Completed/Met Goal: Ability to identify and utilize support systems that promote safety will improve Outcome: Completed/Met   Problem: Self-Concept: Goal: Will verbalize positive feelings about self Outcome: Completed/Met Goal: Level of anxiety will decrease Outcome: Completed/Met   Problem: Education: Goal: Ability to make informed decisions regarding treatment will improve Outcome: Completed/Met   Problem: Coping: Goal: Coping ability will improve Outcome: Completed/Met   Problem: Health Behavior/Discharge Planning: Goal: Identification of resources available to assist in meeting health care needs will improve Outcome: Completed/Met   Problem: Medication: Goal: Compliance with prescribed medication regimen will improve Outcome: Completed/Met   Problem: Self-Concept: Goal: Ability to disclose and discuss suicidal ideas will improve Outcome: Completed/Met Goal: Will verbalize positive feelings about self Outcome: Completed/Met   Problem: Activity: Goal: Will verbalize the importance of balancing activity with adequate rest periods Outcome: Completed/Met   Problem: Education: Goal: Will be free of psychotic symptoms Outcome: Completed/Met Goal: Knowledge of the prescribed therapeutic regimen will improve Outcome: Completed/Met   Problem: Coping: Goal: Coping ability will improve Outcome: Completed/Met Goal: Will verbalize feelings Outcome: Completed/Met   Problem: Health Behavior/Discharge Planning: Goal: Compliance with prescribed medication regimen will improve Outcome: Completed/Met   Problem: Nutritional: Goal: Ability to achieve adequate nutritional intake will improve Outcome: Completed/Met    Problem: Role Relationship: Goal: Ability to communicate needs accurately will improve Outcome: Completed/Met Goal: Ability to interact with others will improve Outcome: Completed/Met   Problem: Safety: Goal: Ability to redirect hostility and anger into socially appropriate behaviors will improve Outcome: Completed/Met Goal: Ability to remain free from injury will improve Outcome: Completed/Met   Problem: Self-Care: Goal: Ability to participate in self-care as condition permits will improve Outcome: Completed/Met   Problem: Self-Concept: Goal: Will verbalize positive feelings about self Outcome: Completed/Met
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
## 2018-11-13 NOTE — [facility] Suicide Risk Assessment (Addendum)
[place] INPATIENT:  Family/Significant Other Suicide Prevention Education  Suicide Prevention Education:  Contact Attempts: Almyra Deforest, [phone], has been identified by the patient as the family member/significant other with whom the patient will be residing, and identified as the person(s) who will aid the patient in the event of a mental health crisis.  With written consent from the patient, two attempts were made to provide suicide prevention education, prior to and/or following the patient's discharge.  We were unsuccessful in providing suicide prevention education.  A suicide education pamphlet was given to the patient to share with family/significant other.  Date and time of first attempt:11/13/18, 0936 Date and time of second attempt: 11/13/18, 10:29  Joanne Chars, LCSW 11/13/2018, 9:36 AM

## 2019-02-06 ENCOUNTER — Ambulatory Visit (HOSPITAL_COMMUNITY)
Admission: EM | Admit: 2019-02-06 | Discharge: 2019-02-06 | Disposition: A | Discharge status: Home / Self Care | Payer: Medicare (Managed Care) | Attending: Internal Medicine | Admitting: Internal Medicine

## 2019-02-06 ENCOUNTER — Encounter (HOSPITAL_COMMUNITY): Payer: Self-pay

## 2019-02-06 ENCOUNTER — Other Ambulatory Visit: Payer: Self-pay

## 2019-02-06 DIAGNOSIS — W57XXXA Bitten or stung by nonvenomous insect and other nonvenomous arthropods, initial encounter: Secondary | ICD-10-CM

## 2019-02-06 DIAGNOSIS — T148XXA Other injury of unspecified body region, initial encounter: Secondary | ICD-10-CM

## 2019-02-06 MED ORDER — HYDROXYZINE HCL 25 MG PO TABS: 25.0000 mg | ORAL_TABLET | Freq: Four times a day (QID) | ORAL | 0 refills | Status: DC | PRN

## 2019-02-06 NOTE — ED Triage Notes (Signed)
Pt presents with rash on different areas of the body from unknown source.

## 2019-02-06 NOTE — ED Provider Notes (Signed)
[facility]    CSN: [id number] Arrival date & time: 02/06/19  1442     History   Chief Complaint Chief Complaint  Patient presents with  . Rash    HPI Amanda [name] is a 38 y.o. female with a history of asthma, hypertension and hip dysplasia comes to urgent care with complaints of pruritic rash of a few days duration.  Patient noticed rash after she woke up in the morning 2 days ago.  She also saw a bedbug and got red blood.  She has tried calamine lotion and other over-the-counter remedies with no improvement in the rash.  No fever or chills.  No nausea or vomiting.  HPI  Past Medical History:  Diagnosis Date  . Anxiety   . Asthma   . Asthma due to environmental allergies   . Asthma due to seasonal allergies   . Bipolar 1 disorder ([place])   . Borderline personality disorder ([place])   . Brain bleed ([place])   . Chronic post-traumatic stress disorder (PTSD)    complex chronic with psychotic features and self harm behaviors  . Complication of anesthesia   . Constipation   . Dander (animal) allergy   . Hearing loss    right ear  . Heart murmur    denies seeing a cardiologist  . Hip dysplasia   . Hypertension    Gestional   . Major depression, chronic   . Mood disorder ([place])   . OCD (obsessive compulsive disorder)   . Pneumonia   . PONV (postoperative nausea and vomiting)   . PTSD (post-traumatic stress disorder)   . RA (rheumatoid arthritis) ([place])   . Rheumatoid arthritis ([place])   . Schizophrenia ([place])    "I think that is wrong"  . Seizure disorder ([place])   . Suicidal ideations   . Suicide attempt [facility])     Patient Active Problem List   Diagnosis Date Noted  . Major depressive disorder, recurrent episode, severe ([place]) 11/13/2018  . Bipolar 1 disorder, depressed ([place]) 11/10/2018  . Seizures ([place]) 09/23/2018  . Disruption of episiotomy wound in the puerperium 09/04/2018  . Postpartum care following vaginal delivery 07/28/2018  . Encounter for IUD insertion  07/24/2018  . Gestational hypertension 06/18/2018  . Pap smear of cervix shows high risk HPV present 01/14/2018  . Supervision of high risk pregnancy, antepartum 01/08/2018  . Seizure disorder during pregnancy ([place]) 01/08/2018  . Advanced maternal age, primigravida 01/08/2018  . Bipolar 2 disorder ([place]) 01/08/2018  . Borderline personality disorder ([place]) 01/08/2018  . Major depression, chronic 01/08/2018  . PTSD (post-traumatic stress disorder) 01/08/2018  . OCD (obsessive compulsive disorder) 01/08/2018  . Asthma   . Seizure disorder ([place]) 11/06/2017  . Schizoaffective disorder, depressive type ([place]) 08/16/2017    Past Surgical History:  Procedure Laterality Date  . Brain Shunt  1981   "a few hours old"  . EYE SURGERY Bilateral 1985  . INTRAUTERINE DEVICE (IUD) INSERTION N/A 09/16/2018   Procedure: INTRAUTERINE DEVICE (IUD) INSERTION;  Surgeon: Lavonia Drafts, MD;  Location: [facility];  Service: Gynecology;  Laterality: N/A;  . PERINEUM REPAIR N/A 09/16/2018   Procedure: EPISIOTOMY REVISION;  Surgeon: Lavonia Drafts, MD;  Location: [facility];  Service: Gynecology;  Laterality: N/A;  . [place]  . Tooth Removal     multiple    OB History    Gravida  2   Para  1   Term  1   Preterm  0   AB  1   Living  1     SAB  1   TAB  0   Ectopic  0   Multiple  0   Live Births  1            Home Medications    Prior to Admission medications   Medication Sig Start Date End Date Taking? Authorizing Provider  busPIRone (BUSPAR) 15 MG tablet Take 1 tablet (15 mg total) by mouth 3 (three) times daily. 11/13/18   Johnn Hai, MD  EPINEPHrine 0.3 mg/0.3 mL IJ SOAJ injection Inject 0.3 mg into the muscle as needed for anaphylaxis.     [provider]  FLUoxetine (PROZAC) 20 MG capsule Take 1 capsule (20 mg total) by mouth daily. 11/14/18   Johnn Hai, MD  hydrOXYzine (ATARAX/VISTARIL) 25 MG tablet Take 1 tablet (25 mg total) by mouth every 6  (six) hours as needed. 02/06/19   Thressa Shiffer, Myrene Galas, MD  labetalol (NORMODYNE) 200 MG tablet Take 1 tablet (200 mg total) by mouth 3 (three) times daily. 07/02/18   Leftwich-Kirby, Kathie Dike, CNM  prazosin (MINIPRESS) 2 MG capsule Take 2 capsules (4 mg total) by mouth at bedtime. 11/13/18   Johnn Hai, MD  temazepam (RESTORIL) 30 MG capsule Take 1 capsule (30 mg total) by mouth at bedtime. 11/13/18   Johnn Hai, MD    Family History Family History  Problem Relation Age of Onset  . Heart attack Mother   . Stroke Mother   . Liver cancer Mother   . Diabetes Mother   . Lung cancer Mother   . Alcoholism Father   . Sleep apnea Brother   . Depression Brother   . ADD / ADHD Brother   . Diabetes Maternal Aunt   . Diabetes Paternal Uncle   . Colon cancer Paternal Uncle   . Diabetes Maternal Grandmother   . Dementia Maternal Grandmother     Social History Social History   Tobacco Use  . Smoking status: Current Every Day Smoker    Packs/day: 0.50    Years: 26.00    Pack years: 13.00    Types: Cigarettes  . Smokeless tobacco: Never Used  Substance Use Topics  . Alcohol use: Not Currently    Frequency: Never  . Drug use: Not Currently    Types: Marijuana    Comment:  last used in Jan 2019 due to "RA" per pt     Allergies   Aspartame and phenylalanine, Benadryl [diphenhydramine], Other, Scallops [shellfish allergy], Yellow jacket venom [bee venom], Pollen extract, Tegretol [carbamazepine], Adhesive [tape], and Latex   Review of Systems Review of Systems  Constitutional: Negative for activity change, appetite change, chills, fatigue and fever.  HENT: Negative.   Respiratory: Negative for chest tightness and shortness of breath.   Cardiovascular: Negative for chest pain and palpitations.  Gastrointestinal: Negative for abdominal distention, abdominal pain, diarrhea, nausea and vomiting.  Musculoskeletal: Negative for arthralgias.  Skin: Positive for color change and rash.  Negative for pallor and wound.     Physical Exam Triage Vital Signs ED Triage Vitals  Enc Vitals Group     BP 02/06/19 1519 110/82     Pulse Rate 02/06/19 1519 68     Resp 02/06/19 1519 18     Temp 02/06/19 1519 98.3 F (36.8 C)     Temp Source 02/06/19 1519 Oral     SpO2 02/06/19 1519 95 %     Weight --      Height --  Head Circumference --      Peak Flow --      Pain Score 02/06/19 1520 0     Pain Loc --      Pain Edu? --      Excl. in [place]? --    No data found.  Updated Vital Signs BP 110/82 (BP Location: Left Arm)   Pulse 68   Temp 98.3 F (36.8 C) (Oral)   Resp 18   SpO2 95%   Visual Acuity Right Eye Distance:   Left Eye Distance:   Bilateral Distance:    Right Eye Near:   Left Eye Near:    Bilateral Near:     Physical Exam Constitutional:      General: She is not in acute distress.    Appearance: Normal appearance. She is not ill-appearing or toxic-appearing.  Cardiovascular:     Rate and Rhythm: Normal rate and regular rhythm.     Pulses: Normal pulses.     Heart sounds: Normal heart sounds.  Pulmonary:     Effort: Pulmonary effort is normal.     Breath sounds: Normal breath sounds.  Abdominal:     General: Bowel sounds are normal.     Palpations: Abdomen is soft.  Skin:    Capillary Refill: Capillary refill takes less than 2 seconds.     Findings: Rash present.     Comments: Generalized papular rash with erythematous base.  Rash is over the torso and the extremities.  No vesicles noted.  Neurological:     General: No focal deficit present.     Mental Status: She is alert.      UC Treatments / Results  Labs (all labs ordered are listed, but only abnormal results are displayed) Labs Reviewed - No data to display  EKG None  Radiology No results found.  Procedures Procedures (including critical care time)  Medications Ordered in UC Medications - No data to display  Initial Impression / Assessment and Plan / UC Course  I have  reviewed the triage vital signs and the nursing notes.  Pertinent labs & imaging results that were available during my care of the patient were reviewed by me and considered in my medical decision making (see chart for details).     1.  Bug bites: Hydroxyzine as needed for itching Patient is advised to change bed sheets and inspect the bed from bug bites. If patient develops worsening erythema, fever, chills that she is advised to return to urgent care to be reevaluated. Final Clinical Impressions(s) / UC Diagnoses   Final diagnoses:  Bedbug bite, initial encounter   Discharge Instructions   None    ED Prescriptions    Medication Sig Dispense Auth. Provider   hydrOXYzine (ATARAX/VISTARIL) 25 MG tablet Take 1 tablet (25 mg total) by mouth every 6 (six) hours as needed. 30 tablet Taiz Bickle, Myrene Galas, MD     Controlled Substance Prescriptions [place] Controlled Substance Registry consulted? No   Chase Picket, MD 02/07/19 [phone]

## 2019-03-05 ENCOUNTER — Other Ambulatory Visit: Payer: Self-pay

## 2019-03-05 ENCOUNTER — Ambulatory Visit
Admission: EM | Admit: 2019-03-05 | Discharge: 2019-03-05 | Disposition: A | Discharge status: Home / Self Care | Payer: Medicare (Managed Care) | Attending: Physician Assistant | Admitting: Physician Assistant

## 2019-03-05 DIAGNOSIS — R350 Frequency of micturition: Secondary | ICD-10-CM | POA: Insufficient documentation

## 2019-03-05 DIAGNOSIS — Z3202 Encounter for pregnancy test, result negative: Secondary | ICD-10-CM | POA: Diagnosis not present

## 2019-03-05 DIAGNOSIS — R112 Nausea with vomiting, unspecified: Secondary | ICD-10-CM | POA: Insufficient documentation

## 2019-03-05 LAB — POCT URINALYSIS DIP (MANUAL ENTRY)
Blood, UA: NEGATIVE
Glucose, UA: NEGATIVE mg/dL
Nitrite, UA: NEGATIVE
Protein Ur, POC: 100 mg/dL — AB
Spec Grav, UA: >=1.03 — AB (ref 1.010–1.025)
Urobilinogen, UA: 4 E.U./dL — AB
pH, UA: 6 (ref 5.0–8.0)

## 2019-03-05 LAB — POCT URINE PREGNANCY: Preg Test, Ur: NEGATIVE

## 2019-03-05 MED ORDER — OMEPRAZOLE 20 MG PO CPDR: 20.0000 mg | DELAYED_RELEASE_CAPSULE | Freq: Every day | ORAL | 0 refills | Status: DC

## 2019-03-05 MED ORDER — ONDANSETRON 4 MG PO TBDP: 4.0000 mg | ORAL_TABLET | Freq: Three times a day (TID) | ORAL | 0 refills | Status: DC | PRN

## 2019-03-05 NOTE — ED Provider Notes (Signed)
[facility]    CSN: [id number] Arrival date & time: 03/05/19  1340     History   Chief Complaint Chief Complaint  Patient presents with  . Emesis    HPI Amanda [name] is a 38 y.o. female.   38 year old female with significant psych history on multiple medications comes in for 2-week history of nausea and vomiting.  She still been able to tolerate oral intake, and has been eating "weird combinations of food".  States due to these weird cravings, she thought she was pregnant and came in for evaluation.  She has had urinary frequency without dysuria or hematuria.  Denies abdominal pain.  Denies fever, chills, night sweats.  Denies URI symptoms such as cough, congestion, sore throat.  She has baseline right leg weakness.  Denies dizziness, lightheadedness, syncope.  She denies changes in medication dosages, or taking higher dosage than prescribed.  Denies drug use.  Last use of THC 2 years ago. LMP 01/19/2019. Has IUD.      Past Medical History:  Diagnosis Date  . Anxiety   . Asthma   . Asthma due to environmental allergies   . Asthma due to seasonal allergies   . Bipolar 1 disorder ([place])   . Borderline personality disorder ([place])   . Brain bleed ([place])   . Chronic post-traumatic stress disorder (PTSD)    complex chronic with psychotic features and self harm behaviors  . Complication of anesthesia   . Constipation   . Dander (animal) allergy   . Hearing loss    right ear  . Heart murmur    denies seeing a cardiologist  . Hip dysplasia   . Hypertension    Gestional   . Major depression, chronic   . Mood disorder ([place])   . OCD (obsessive compulsive disorder)   . Pneumonia   . PONV (postoperative nausea and vomiting)   . PTSD (post-traumatic stress disorder)   . RA (rheumatoid arthritis) ([place])   . Rheumatoid arthritis ([place])   . Schizophrenia ([place])    "I think that is wrong"  . Seizure disorder ([place])   . Suicidal ideations   . Suicide attempt [facility])      Patient Active Problem List   Diagnosis Date Noted  . Major depressive disorder, recurrent episode, severe ([place]) 11/13/2018  . Bipolar 1 disorder, depressed ([place]) 11/10/2018  . Seizures ([place]) 09/23/2018  . Disruption of episiotomy wound in the puerperium 09/04/2018  . Postpartum care following vaginal delivery 07/28/2018  . Encounter for IUD insertion 07/24/2018  . Gestational hypertension 06/18/2018  . Pap smear of cervix shows high risk HPV present 01/14/2018  . Supervision of high risk pregnancy, antepartum 01/08/2018  . Seizure disorder during pregnancy ([place]) 01/08/2018  . Advanced maternal age, primigravida 01/08/2018  . Bipolar 2 disorder ([place]) 01/08/2018  . Borderline personality disorder ([place]) 01/08/2018  . Major depression, chronic 01/08/2018  . PTSD (post-traumatic stress disorder) 01/08/2018  . OCD (obsessive compulsive disorder) 01/08/2018  . Asthma   . Seizure disorder ([place]) 11/06/2017  . Schizoaffective disorder, depressive type ([place]) 08/16/2017    Past Surgical History:  Procedure Laterality Date  . Brain Shunt  1981   "a few hours old"  . EYE SURGERY Bilateral 1985  . INTRAUTERINE DEVICE (IUD) INSERTION N/A 09/16/2018   Procedure: INTRAUTERINE DEVICE (IUD) INSERTION;  Surgeon: Lavonia Drafts, MD;  Location: [facility];  Service: Gynecology;  Laterality: N/A;  . PERINEUM REPAIR N/A 09/16/2018   Procedure: EPISIOTOMY REVISION;  Surgeon: Lavonia Drafts, MD;  Location: [facility];  Service: Gynecology;  Laterality: N/A;  . [place]  . Tooth Removal     multiple    OB History    Gravida  2   Para  1   Term  1   Preterm  0   AB  1   Living  1     SAB  1   TAB  0   Ectopic  0   Multiple  0   Live Births  1           Home Medications    Prior to Admission medications   Medication Sig Start Date End Date Taking? Authorizing Provider  busPIRone (BUSPAR) 15 MG tablet Take 1 tablet (15 mg total) by mouth 3 (three) times  daily. 11/13/18   Johnn Hai, MD  EPINEPHrine 0.3 mg/0.3 mL IJ SOAJ injection Inject 0.3 mg into the muscle as needed for anaphylaxis.     [provider]  FLUoxetine (PROZAC) 20 MG capsule Take 1 capsule (20 mg total) by mouth daily. 11/14/18   Johnn Hai, MD  hydrOXYzine (ATARAX/VISTARIL) 25 MG tablet Take 1 tablet (25 mg total) by mouth every 6 (six) hours as needed. 02/06/19   [place], Myrene Galas, MD  labetalol (NORMODYNE) 200 MG tablet Take 1 tablet (200 mg total) by mouth 3 (three) times daily. 07/02/18   Leftwich-Kirby, Kathie Dike, CNM  omeprazole (PRILOSEC) 20 MG capsule Take 1 capsule (20 mg total) by mouth daily. 03/05/19   Tasia Catchings, Geneal Huebert V, PA-C  ondansetron (ZOFRAN ODT) 4 MG disintegrating tablet Take 1 tablet (4 mg total) by mouth every 8 (eight) hours as needed for nausea or vomiting. 03/05/19   Tasia Catchings, Miyako Oelke V, PA-C  prazosin (MINIPRESS) 2 MG capsule Take 2 capsules (4 mg total) by mouth at bedtime. 11/13/18   Johnn Hai, MD  temazepam (RESTORIL) 30 MG capsule Take 1 capsule (30 mg total) by mouth at bedtime. 11/13/18   Johnn Hai, MD   Family History Family History  Problem Relation Age of Onset  . Heart attack Mother   . Stroke Mother   . Liver cancer Mother   . Diabetes Mother   . Lung cancer Mother   . Alcoholism Father   . Sleep apnea Brother   . Depression Brother   . ADD / ADHD Brother   . Diabetes Maternal Aunt   . Diabetes Paternal Uncle   . Colon cancer Paternal Uncle   . Diabetes Maternal Grandmother   . Dementia Maternal Grandmother     Social History Social History   Tobacco Use  . Smoking status: Current Every Day Smoker    Packs/day: 0.50    Years: 26.00    Pack years: 13.00    Types: Cigarettes  . Smokeless tobacco: Never Used  Substance Use Topics  . Alcohol use: Not Currently    Frequency: Never  . Drug use: Not Currently    Types: Marijuana    Comment:  last used in Jan 2019 due to "RA" per pt     Allergies   Aspartame and phenylalanine,  Benadryl [diphenhydramine], Other, Scallops [shellfish allergy], Yellow jacket venom [bee venom], Pollen extract, Tegretol [carbamazepine], Adhesive [tape], and Latex   Review of Systems Review of Systems  Reason unable to perform ROS: See HPI as above.     Physical Exam Triage Vital Signs ED Triage Vitals  Enc Vitals Group     BP 03/05/19 1351 132/89     Pulse Rate 03/05/19  1351 85     Resp 03/05/19 1351 20     Temp 03/05/19 1351 98.5 F (36.9 C)     Temp Source 03/05/19 1351 Oral     SpO2 03/05/19 1351 97 %     Weight --      Height --      Head Circumference --      Peak Flow --      Pain Score 03/05/19 1352 0     Pain Loc --      Pain Edu? --      Excl. in [place]? --    No data found.  Updated Vital Signs BP 132/89 (BP Location: Left Arm)   Pulse 85   Temp 98.5 F (36.9 C) (Oral)   Resp 20   LMP 01/19/2019   SpO2 97%   Physical Exam Constitutional:      General: She is not in acute distress.    Appearance: She is well-developed. She is not ill-appearing, toxic-appearing or diaphoretic.  HENT:     Head: Normocephalic and atraumatic.  Eyes:     Conjunctiva/sclera: Conjunctivae normal.     Pupils: Pupils are equal, round, and reactive to light.  Cardiovascular:     Rate and Rhythm: Normal rate and regular rhythm.     Heart sounds: Normal heart sounds. No murmur. No friction rub. No gallop.   Pulmonary:     Effort: Pulmonary effort is normal.     Breath sounds: Normal breath sounds. No wheezing or rales.  Abdominal:     General: Bowel sounds are normal.     Palpations: Abdomen is soft.     Tenderness: There is no abdominal tenderness. There is no right CVA tenderness, left CVA tenderness, guarding or rebound.  Skin:    General: Skin is warm and dry.  Neurological:     Mental Status: She is alert and oriented to person, place, and time.  Psychiatric:        Behavior: Behavior normal.        Judgment: Judgment normal.     UC Treatments / Results  Labs  (all labs ordered are listed, but only abnormal results are displayed) Labs Reviewed  POCT URINALYSIS DIP (MANUAL ENTRY) - Abnormal; Notable for the following components:      Result Value   Bilirubin, UA moderate (*)    Ketones, POC UA small (15) (*)    Spec Grav, UA >=1.030 (*)    Protein Ur, POC =100 (*)    Urobilinogen, UA 4.0 (*)    Leukocytes, UA Trace (*)    All other components within normal limits  URINE CULTURE  POCT URINE PREGNANCY    EKG   Radiology No results found.  Procedures Procedures (including critical care time)  Medications Ordered in UC Medications - No data to display  Initial Impression / Assessment and Plan / UC Course  I have reviewed the triage vital signs and the nursing notes.  Pertinent labs & imaging results that were available during my care of the patient were reviewed by me and considered in my medical decision making (see chart for details).    Urine negative for pregnancy.  Trace leukocytes on dipstick, will send for urine culture.  At this time, without abdominal pain.  Will provide Zofran as needed for nausea.  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  She states "it worked well for me".  Will  provide short course of Zofran as needed.  Also provide omeprazole for possible acid reflux.  Push fluids.  Bland diet, advance as tolerated.  Patient to follow-up with PCP for further evaluation if symptoms not improving.  Return precautions given.  Patient expresses understanding and agrees to plan.  Final Clinical Impressions(s) / UC Diagnoses   Final diagnoses:  Intractable vomiting with nausea, unspecified vomiting type    ED Prescriptions    Medication Sig Dispense Auth. Provider   omeprazole (PRILOSEC) 20 MG capsule Take 1 capsule (20 mg total) by mouth daily. 15 capsule Jaydeen Odor V, PA-C   ondansetron (ZOFRAN ODT) 4 MG disintegrating  tablet Take 1 tablet (4 mg total) by mouth every 8 (eight) hours as needed for nausea or vomiting. 5 tablet Tobin Chad, [place] 03/05/19 1451

## 2019-03-05 NOTE — ED Triage Notes (Signed)
Pt c/o n/v for the past 2wks, and urinating a lot. States has an IUD but late on my cycle

## 2019-03-05 NOTE — Discharge Instructions (Signed)
Start zofran for nausea/vomiting. Omeprazole for acid reflux. Bland diet, increased as tolerated. Follow up with PCP for further evaluation if symptoms not improving. If worsening symptoms, abdominal pain, vomiting not controlled by medicine, worsening weakness, dizziness go to the ED for further evaluation needed.

## 2019-03-06 LAB — URINE CULTURE: Culture: 40000 — AB

## 2019-03-31 ENCOUNTER — Other Ambulatory Visit: Payer: Self-pay

## 2019-03-31 ENCOUNTER — Encounter (HOSPITAL_COMMUNITY): Payer: Self-pay | Admitting: Emergency Medicine

## 2019-03-31 DIAGNOSIS — I10 Essential (primary) hypertension: Secondary | ICD-10-CM | POA: Insufficient documentation

## 2019-03-31 DIAGNOSIS — J45909 Unspecified asthma, uncomplicated: Secondary | ICD-10-CM | POA: Diagnosis not present

## 2019-03-31 DIAGNOSIS — R112 Nausea with vomiting, unspecified: Secondary | ICD-10-CM | POA: Diagnosis present

## 2019-03-31 DIAGNOSIS — T781XXA Other adverse food reactions, not elsewhere classified, initial encounter: Secondary | ICD-10-CM | POA: Diagnosis not present

## 2019-03-31 DIAGNOSIS — F17210 Nicotine dependence, cigarettes, uncomplicated: Secondary | ICD-10-CM | POA: Diagnosis not present

## 2019-03-31 DIAGNOSIS — Z91040 Latex allergy status: Secondary | ICD-10-CM | POA: Insufficient documentation

## 2019-03-31 DIAGNOSIS — Z79899 Other long term (current) drug therapy: Secondary | ICD-10-CM | POA: Insufficient documentation

## 2019-03-31 MED ORDER — SODIUM CHLORIDE 0.9% FLUSH: 3.0000 mL | Freq: Once | INTRAVENOUS | Status: DC

## 2019-03-31 NOTE — ED Triage Notes (Signed)
Patient here from home via EMS with complaints of nausea, vomiting after drinking a soda containing artifical sweetener. Reports allergy to Aspartame.

## 2019-04-01 ENCOUNTER — Other Ambulatory Visit: Payer: Self-pay

## 2019-04-01 ENCOUNTER — Emergency Department (HOSPITAL_COMMUNITY)
Admission: EM | Admit: 2019-04-01 | Discharge: 2019-04-01 | Disposition: A | Discharge status: Home / Self Care | Payer: Medicare (Managed Care) | Attending: Emergency Medicine | Admitting: Emergency Medicine

## 2019-04-01 DIAGNOSIS — T7840XA Allergy, unspecified, initial encounter: Secondary | ICD-10-CM

## 2019-04-01 LAB — COMPREHENSIVE METABOLIC PANEL
ALT: 11 U/L (ref 0–44)
AST: 14 U/L — ABNORMAL LOW (ref 15–41)
Albumin: 4.3 g/dL (ref 3.5–5.0)
Alkaline Phosphatase: 95 U/L (ref 38–126)
Anion gap: 8 (ref 5–15)
BUN: 21 mg/dL — ABNORMAL HIGH (ref 6–20)
CO2: 24 mmol/L (ref 22–32)
Calcium: 9 mg/dL (ref 8.9–10.3)
Chloride: 106 mmol/L (ref 98–111)
Creatinine, Ser: 0.91 mg/dL (ref 0.44–1.00)
GFR calc Af Amer: >60 mL/min (ref >60)
GFR calc non Af Amer: >60 mL/min (ref >60)
Glucose, Bld: 98 mg/dL (ref 70–99)
Potassium: 4.1 mmol/L (ref 3.5–5.1)
Sodium: 138 mmol/L (ref 135–145)
Total Bilirubin: 0.5 mg/dL (ref 0.3–1.2)
Total Protein: 7.8 g/dL (ref 6.5–8.1)

## 2019-04-01 LAB — CBC
HCT: 47.5 % — ABNORMAL HIGH (ref 36.0–46.0)
Hemoglobin: 15.3 g/dL — ABNORMAL HIGH (ref 12.0–15.0)
MCH: 28.5 pg (ref 26.0–34.0)
MCHC: 32.2 g/dL (ref 30.0–36.0)
MCV: 88.5 fL (ref 80.0–100.0)
Platelets: 346 10*3/uL (ref 150–400)
RBC: 5.37 MIL/uL — ABNORMAL HIGH (ref 3.87–5.11)
RDW: 15.3 % (ref 11.5–15.5)
WBC: 12.8 10*3/uL — ABNORMAL HIGH (ref 4.0–10.5)
nRBC: 0 % (ref 0.0–0.2)

## 2019-04-01 LAB — URINALYSIS, ROUTINE W REFLEX MICROSCOPIC
Bacteria, UA: NONE SEEN
Bilirubin Urine: NEGATIVE
Glucose, UA: NEGATIVE mg/dL
Hgb urine dipstick: NEGATIVE
Ketones, ur: 5 mg/dL — AB
Nitrite: NEGATIVE
Protein, ur: NEGATIVE mg/dL
Specific Gravity, Urine: 1.032 — ABNORMAL HIGH (ref 1.005–1.030)
pH: 5 (ref 5.0–8.0)

## 2019-04-01 LAB — I-STAT BETA HCG BLOOD, ED (MC, WL, AP ONLY): I-stat hCG, quantitative: <5 m[IU]/mL (ref <5)

## 2019-04-01 LAB — LIPASE, BLOOD: Lipase: 24 U/L (ref 11–51)

## 2019-04-01 MED ORDER — ONDANSETRON 4 MG PO TBDP
4.0000 mg | ORAL_TABLET | Freq: Once | ORAL | Status: AC
  Administered 2019-04-01: 03:00:00 4 mg via ORAL
  Filled 2019-04-01: qty 1

## 2019-04-01 MED ORDER — EPINEPHRINE 0.3 MG/0.3ML IJ SOAJ: 0.3000 mg | INTRAMUSCULAR | 0 refills | Status: DC | PRN

## 2019-04-01 MED ORDER — FAMOTIDINE 20 MG PO TABS
20.0000 mg | ORAL_TABLET | Freq: Once | ORAL | Status: AC
  Administered 2019-04-01: 20 mg via ORAL
  Filled 2019-04-01: qty 1

## 2019-04-01 MED ORDER — HYDROXYZINE HCL 25 MG PO TABS: 25.0000 mg | ORAL_TABLET | Freq: Four times a day (QID) | ORAL | 0 refills | Status: DC | PRN

## 2019-04-01 MED ORDER — PREDNISONE 20 MG PO TABS
60.0000 mg | ORAL_TABLET | Freq: Once | ORAL | Status: AC
  Administered 2019-04-01: 03:00:00 60 mg via ORAL
  Filled 2019-04-01: qty 3

## 2019-04-01 MED ORDER — HYDROXYZINE HCL 25 MG PO TABS
25.0000 mg | ORAL_TABLET | Freq: Once | ORAL | Status: AC
  Administered 2019-04-01: 25 mg via ORAL
  Filled 2019-04-01: qty 1

## 2019-04-01 MED ORDER — FAMOTIDINE 20 MG PO TABS: 20.0000 mg | ORAL_TABLET | Freq: Two times a day (BID) | ORAL | 0 refills | Status: DC

## 2019-04-01 MED ORDER — PREDNISONE 20 MG PO TABS: 40.0000 mg | ORAL_TABLET | Freq: Every day | ORAL | 0 refills | Status: DC

## 2019-04-01 NOTE — ED Notes (Signed)
Patient given PO fluids and encouraged to drink.  

## 2019-04-01 NOTE — Discharge Instructions (Addendum)
Your seen today for allergic reaction.  No signs or symptoms of anaphylaxis.  Take medications as prescribed.  If you develop recurrence of symptoms, shortness of breath, throat swelling, you need to be reevaluated.  You should use your EpiPen at that time.

## 2019-04-01 NOTE — ED Notes (Addendum)
Verbalized understanding discharge instructions and prescriptions. In no acute distress.  Pt is taking the bus home.    Pt reports she is surprised her pregnancy test was negative. Sts she has several symptoms.  Asking for a blood test.  Pt informed we performed a blood test and it was negative.

## 2019-04-01 NOTE — ED Notes (Signed)
Pt able to tolerate fluids.  Still complaining of headache 8/10.

## 2019-04-01 NOTE — ED Provider Notes (Signed)
[name] Provider Note   CSN: [id number] Arrival date & time: 03/31/19  2239     History   Chief Complaint Chief Complaint  Patient presents with  . Nausea  . Emesis    HPI Amanda [name] is a 38 y.o. female.     HPI  This is a 38 year old female who presents with possible allergic reaction.  Patient reports that she accidentally drank a drink with artificial sweetener.  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  Patient reports that she had lip tingling and nausea and vomiting.  No rash or shortness of breath.  She did not take anything for her symptoms.  These are symptoms consistent with her prior allergic reactions.  Currently she is only endorsing nausea.  Her ingestion was around 9 PM.  At my time evaluation was around 3 AM.  Past Medical History:  Diagnosis Date  . Anxiety   . Asthma   . Asthma due to environmental allergies   . Asthma due to seasonal allergies   . Bipolar 1 disorder ([place])   . Borderline personality disorder ([place])   . Brain bleed ([place])   . Chronic post-traumatic stress disorder (PTSD)    complex chronic with psychotic features and self harm behaviors  . Complication of anesthesia   . Constipation   . Dander (animal) allergy   . Hearing loss    right ear  . Heart murmur    denies seeing a cardiologist  . Hip dysplasia   . Hypertension    Gestional   . Major depression, chronic   . Mood disorder ([place])   . OCD (obsessive compulsive disorder)   . Pneumonia   . PONV (postoperative nausea and vomiting)   . PTSD (post-traumatic stress disorder)   . RA (rheumatoid arthritis) ([place])   . Rheumatoid arthritis ([place])   . Schizophrenia ([place])    "I think that is wrong"  . Seizure disorder ([place])   . Suicidal ideations   . Suicide attempt [facility])     Patient Active Problem List   Diagnosis Date Noted  . Major depressive disorder, recurrent episode, severe ([place]) 11/13/2018  . Bipolar 1 disorder, depressed  ([place]) 11/10/2018  . Seizures ([place]) 09/23/2018  . Disruption of episiotomy wound in the puerperium 09/04/2018  . Postpartum care following vaginal delivery 07/28/2018  . Encounter for IUD insertion 07/24/2018  . Gestational hypertension 06/18/2018  . Pap smear of cervix shows high risk HPV present 01/14/2018  . Supervision of high risk pregnancy, antepartum 01/08/2018  . Seizure disorder during pregnancy ([place]) 01/08/2018  . Advanced maternal age, primigravida 01/08/2018  . Bipolar 2 disorder ([place]) 01/08/2018  . Borderline personality disorder ([place]) 01/08/2018  . Major depression, chronic 01/08/2018  . PTSD (post-traumatic stress disorder) 01/08/2018  . OCD (obsessive compulsive disorder) 01/08/2018  . Asthma   . Seizure disorder ([place]) 11/06/2017  . Schizoaffective disorder, depressive type ([place]) 08/16/2017    Past Surgical History:  Procedure Laterality Date  . Brain Shunt  1981   "a few hours old"  . EYE SURGERY Bilateral 1985  . INTRAUTERINE DEVICE (IUD) INSERTION N/A 09/16/2018   Procedure: INTRAUTERINE DEVICE (IUD) INSERTION;  Surgeon: Lavonia Drafts, MD;  Location: [facility];  Service: Gynecology;  Laterality: N/A;  . PERINEUM REPAIR N/A 09/16/2018   Procedure: EPISIOTOMY REVISION;  Surgeon: Lavonia Drafts, MD;  Location: [facility];  Service: Gynecology;  Laterality: N/A;  . [place]  . Tooth Removal  multiple     OB History    Gravida  2   Para  1   Term  1   Preterm  0   AB  1   Living  1     SAB  1   TAB  0   Ectopic  0   Multiple  0   Live Births  1            Home Medications    Prior to Admission medications   Medication Sig Start Date End Date Taking? Authorizing Provider  busPIRone (BUSPAR) 15 MG tablet Take 1 tablet (15 mg total) by mouth 3 (three) times daily. 11/13/18  Yes Johnn Hai, MD  EPINEPHrine 0.3 mg/0.3 mL IJ SOAJ injection Inject 0.3 mg into the muscle as needed for anaphylaxis.    Yes [provider]  FLUoxetine (PROZAC) 20 MG capsule Take 1 capsule (20 mg total) by mouth daily. 11/14/18  Yes Johnn Hai, MD  hydrOXYzine (ATARAX/VISTARIL) 25 MG tablet Take 1 tablet (25 mg total) by mouth every 6 (six) hours as needed. Patient taking differently: Take 25 mg by mouth every 6 (six) hours as needed for anxiety.  02/06/19  Yes Lamptey, Myrene Galas, MD  labetalol (NORMODYNE) 200 MG tablet Take 1 tablet (200 mg total) by mouth 3 (three) times daily. 07/02/18  Yes Leftwich-Kirby, Kathie Dike, CNM  levETIRAcetam (KEPPRA) 750 MG tablet Take 750 mg by mouth 2 (two) times daily. 11/14/18  Yes [provider]  omeprazole (PRILOSEC) 20 MG capsule Take 1 capsule (20 mg total) by mouth daily. 03/05/19  Yes Yu, Amy V, PA-C  ondansetron (ZOFRAN ODT) 4 MG disintegrating tablet Take 1 tablet (4 mg total) by mouth every 8 (eight) hours as needed for nausea or vomiting. 03/05/19  Yes Yu, Amy V, PA-C  prazosin (MINIPRESS) 2 MG capsule Take 2 capsules (4 mg total) by mouth at bedtime. 11/13/18  Yes Johnn Hai, MD  temazepam (RESTORIL) 30 MG capsule Take 1 capsule (30 mg total) by mouth at bedtime. 11/13/18  Yes Johnn Hai, MD  EPINEPHrine 0.3 mg/0.3 mL IJ SOAJ injection Inject 0.3 mLs (0.3 mg total) into the muscle as needed for anaphylaxis. 04/01/19   Juliah Scadden, Barbette Hair, MD  famotidine (PEPCID) 20 MG tablet Take 1 tablet (20 mg total) by mouth 2 (two) times daily. 04/01/19   Eunice Winecoff, Barbette Hair, MD  hydrOXYzine (ATARAX/VISTARIL) 25 MG tablet Take 1 tablet (25 mg total) by mouth every 6 (six) hours as needed for itching. 04/01/19   Jorrell Kuster, Barbette Hair, MD  predniSONE (DELTASONE) 20 MG tablet Take 2 tablets (40 mg total) by mouth daily. 04/01/19   Lenvil Swaim, Barbette Hair, MD    Family History Family History  Problem Relation Age of Onset  . Heart attack Mother   . Stroke Mother   . Liver cancer Mother   . Diabetes Mother   . Lung cancer Mother   . Alcoholism Father   . Sleep apnea Brother   . Depression  Brother   . ADD / ADHD Brother   . Diabetes Maternal Aunt   . Diabetes Paternal Uncle   . Colon cancer Paternal Uncle   . Diabetes Maternal Grandmother   . Dementia Maternal Grandmother     Social History Social History   Tobacco Use  . Smoking status: Current Every Day Smoker    Packs/day: 0.50    Years: 26.00    Pack years: 13.00    Types: Cigarettes  . Smokeless  tobacco: Never Used  Substance Use Topics  . Alcohol use: Not Currently    Frequency: Never  . Drug use: Not Currently    Types: Marijuana    Comment:  last used in Jan 2019 due to "RA" per pt     Allergies   Aspartame and phenylalanine, Benadryl [diphenhydramine], Other, Scallops [shellfish allergy], Yellow jacket venom [bee venom], Pollen extract, Tegretol [carbamazepine], Adhesive [tape], and Latex   Review of Systems Review of Systems  Constitutional: Negative for fever.  HENT: Negative for sore throat and trouble swallowing.   Respiratory: Negative for shortness of breath and wheezing.   Cardiovascular: Negative for chest pain.  Gastrointestinal: Positive for nausea and vomiting.  Skin: Negative for rash.  All other systems reviewed and are negative.    Physical Exam Updated Vital Signs BP 120/85 (BP Location: Left Arm)   Pulse 64   Temp 98.7 F (37.1 C) (Oral)   Resp 15   SpO2 100%   Physical Exam Vitals signs and nursing note reviewed.  Constitutional:      Appearance: She is well-developed. She is not ill-appearing.  HENT:     Head: Normocephalic and atraumatic.     Mouth/Throat:     Mouth: Mucous membranes are moist.     Comments: Uvula midline, no oropharyngeal swelling noted Eyes:     Pupils: Pupils are equal, round, and reactive to light.  Neck:     Musculoskeletal: Neck supple.  Cardiovascular:     Rate and Rhythm: Normal rate and regular rhythm.     Heart sounds: Normal heart sounds.  Pulmonary:     Effort: Pulmonary effort is normal. No respiratory distress.     Breath  sounds: No wheezing.  Abdominal:     General: Bowel sounds are normal.     Palpations: Abdomen is soft.     Tenderness: There is no abdominal tenderness. There is no guarding or rebound.  Skin:    General: Skin is warm and dry.  Neurological:     Mental Status: She is alert and oriented to person, place, and time.  Psychiatric:        Mood and Affect: Mood normal.      ED Treatments / Results  Labs (all labs ordered are listed, but only abnormal results are displayed) Labs Reviewed  COMPREHENSIVE METABOLIC PANEL - Abnormal; Notable for the following components:      Result Value   BUN 21 (*)    AST 14 (*)    All other components within normal limits  CBC - Abnormal; Notable for the following components:   WBC 12.8 (*)    RBC 5.37 (*)    Hemoglobin 15.3 (*)    HCT 47.5 (*)    All other components within normal limits  URINALYSIS, ROUTINE W REFLEX MICROSCOPIC - Abnormal; Notable for the following components:   APPearance HAZY (*)    Specific Gravity, Urine 1.032 (*)    Ketones, ur 5 (*)    Leukocytes,Ua SMALL (*)    All other components within normal limits  LIPASE, BLOOD  I-STAT BETA HCG BLOOD, ED ([facility], [facility], [facility] ONLY)    EKG None  Radiology No results found.  Procedures Procedures (including critical care time)  Medications Ordered in ED Medications  sodium chloride flush (NS) 0.9 % injection 3 mL (has no administration in time range)  predniSONE (DELTASONE) tablet 60 mg (60 mg Oral Given 04/01/19 0305)  ondansetron (ZOFRAN-ODT) disintegrating tablet 4 mg (4 mg Oral Given 04/01/19  0304)  famotidine (PEPCID) tablet 20 mg (20 mg Oral Given 04/01/19 0304)  hydrOXYzine (ATARAX/VISTARIL) tablet 25 mg (25 mg Oral Given 04/01/19 0304)     Initial Impression / Assessment and Plan / ED Course  I have reviewed the triage vital signs and the nursing notes.  Pertinent labs & imaging results that were available during my care of the patient were reviewed by me and considered  in my medical decision making (see chart for details).        Patient presents with allergic reaction to artificial sweetener.  She is overall nontoxic-appearing and vital signs are reassuring.  She reports lip tingling and nausea and vomiting.  However, ingestion was approximately 5 hours prior to evaluation.  She at this time does not have any vital sign abnormalities or indications of shock or anaphylaxis.  She was given Pepcid, Atarax, prednisone, and Zofran.  She is able to orally hydrate and reports some improvement of symptoms.  Recommend symptom control at home.  Will provide with an EpiPen.  After history, exam, and medical workup I feel the patient has been appropriately medically screened and is safe for discharge home. Pertinent diagnoses were discussed with the patient. Patient was given return precautions.   Final Clinical Impressions(s) / ED Diagnoses   Final diagnoses:  Allergic reaction, initial encounter    ED Discharge Orders         Ordered    famotidine (PEPCID) 20 MG tablet  2 times daily     04/01/19 0414    predniSONE (DELTASONE) 20 MG tablet  Daily     04/01/19 0414    hydrOXYzine (ATARAX/VISTARIL) 25 MG tablet  Every 6 hours PRN     04/01/19 0414    EPINEPHrine 0.3 mg/0.3 mL IJ SOAJ injection  As needed     04/01/19 0414           Charolett Yarrow, Barbette Hair, MD 04/01/19 [phone]

## 2019-06-19 ENCOUNTER — Other Ambulatory Visit: Payer: Self-pay

## 2019-06-19 DIAGNOSIS — Z20822 Contact with and (suspected) exposure to covid-19: Secondary | ICD-10-CM

## 2019-06-22 LAB — NOVEL CORONAVIRUS, NAA: SARS-CoV-2, NAA: NOT DETECTED

## 2019-07-14 ENCOUNTER — Emergency Department (HOSPITAL_COMMUNITY)
Admission: EM | Admit: 2019-07-14 | Discharge: 2019-07-14 | Disposition: A | Discharge status: Home / Self Care | Payer: Medicare (Managed Care) | Attending: Emergency Medicine | Admitting: Emergency Medicine

## 2019-07-14 ENCOUNTER — Other Ambulatory Visit: Payer: Self-pay

## 2019-07-14 DIAGNOSIS — F17210 Nicotine dependence, cigarettes, uncomplicated: Secondary | ICD-10-CM | POA: Insufficient documentation

## 2019-07-14 DIAGNOSIS — F4310 Post-traumatic stress disorder, unspecified: Secondary | ICD-10-CM | POA: Diagnosis not present

## 2019-07-14 DIAGNOSIS — Z91040 Latex allergy status: Secondary | ICD-10-CM | POA: Diagnosis not present

## 2019-07-14 DIAGNOSIS — Z3202 Encounter for pregnancy test, result negative: Secondary | ICD-10-CM | POA: Diagnosis not present

## 2019-07-14 DIAGNOSIS — R110 Nausea: Secondary | ICD-10-CM | POA: Diagnosis present

## 2019-07-14 DIAGNOSIS — F250 Schizoaffective disorder, bipolar type: Secondary | ICD-10-CM | POA: Insufficient documentation

## 2019-07-14 DIAGNOSIS — Z79899 Other long term (current) drug therapy: Secondary | ICD-10-CM | POA: Diagnosis not present

## 2019-07-14 LAB — POC URINE PREG, ED: Preg Test, Ur: NEGATIVE

## 2019-07-14 NOTE — ED Provider Notes (Signed)
[facility] EMERGENCY DEPARTMENT Provider Note   CSN: [id number] Arrival date & time: 07/14/19  1216     History   Chief Complaint Chief Complaint  Patient presents with  . Possible Pregnancy    HPI Amanda [name] is a 38 y.o. female with past medical history of PTSD, depression, OCD, mood disorder, schizophrenia, bipolar 1, who presents today for evaluation of concern of pregnancy.  She believes that for the past 4 months she has been pregnant.  She states that normally a urine pregnancy test is not accurate and she requires a blood pregnancy test.  Chart review shows that she has an IUD that was placed.  She believes that she is pregnant as she has had nausea and reportedly gained a significant amount of weight despite only being able to eat chicken nuggets for the past 4 months.  She also reports that she is sensitive to smells, noting whenever she smells gasoline she vomits.  She states that this primarily happens when she is waiting for the bus to go to church.  She denies any fevers.  No vaginal bleeding or abnormal vaginal discharge.   Chart review shows I personally saw her in December of 2019 after she had her newborn child taken by CPS.  She has reported multiple times since then that she thinks she is pregnant and needs a blood pregnancy test, all of which have been negative.        HPI  Past Medical History:  Diagnosis Date  . Anxiety   . Asthma   . Asthma due to environmental allergies   . Asthma due to seasonal allergies   . Bipolar 1 disorder ([place])   . Borderline personality disorder ([place])   . Brain bleed ([place])   . Chronic post-traumatic stress disorder (PTSD)    complex chronic with psychotic features and self harm behaviors  . Complication of anesthesia   . Constipation   . Dander (animal) allergy   . Hearing loss    right ear  . Heart murmur    denies seeing a cardiologist  . Hip dysplasia   . Hypertension    Gestional   . Major  depression, chronic   . Mood disorder ([place])   . OCD (obsessive compulsive disorder)   . Pneumonia   . PONV (postoperative nausea and vomiting)   . PTSD (post-traumatic stress disorder)   . RA (rheumatoid arthritis) ([place])   . Rheumatoid arthritis ([place])   . Schizophrenia ([place])    "I think that is wrong"  . Seizure disorder ([place])   . Suicidal ideations   . Suicide attempt [facility])     Patient Active Problem List   Diagnosis Date Noted  . Major depressive disorder, recurrent episode, severe ([place]) 11/13/2018  . Bipolar 1 disorder, depressed ([place]) 11/10/2018  . Seizures ([place]) 09/23/2018  . Disruption of episiotomy wound in the puerperium 09/04/2018  . Postpartum care following vaginal delivery 07/28/2018  . Encounter for IUD insertion 07/24/2018  . Gestational hypertension 06/18/2018  . Pap smear of cervix shows high risk HPV present 01/14/2018  . Supervision of high risk pregnancy, antepartum 01/08/2018  . Seizure disorder during pregnancy ([place]) 01/08/2018  . Advanced maternal age, primigravida 01/08/2018  . Bipolar 2 disorder ([place]) 01/08/2018  . Borderline personality disorder ([place]) 01/08/2018  . Major depression, chronic 01/08/2018  . PTSD (post-traumatic stress disorder) 01/08/2018  . OCD (obsessive compulsive disorder) 01/08/2018  . Asthma   . Seizure disorder ([place]) 11/06/2017  . Schizoaffective  disorder, depressive type ([place]) 08/16/2017    Past Surgical History:  Procedure Laterality Date  . Brain Shunt  1981   "a few hours old"  . EYE SURGERY Bilateral 1985  . INTRAUTERINE DEVICE (IUD) INSERTION N/A 09/16/2018   Procedure: INTRAUTERINE DEVICE (IUD) INSERTION;  Surgeon: Lavonia Drafts, MD;  Location: [facility];  Service: Gynecology;  Laterality: N/A;  . PERINEUM REPAIR N/A 09/16/2018   Procedure: EPISIOTOMY REVISION;  Surgeon: Lavonia Drafts, MD;  Location: [facility];  Service: Gynecology;  Laterality: N/A;  . [place]  . Tooth Removal     multiple      OB History    Gravida  2   Para  1   Term  1   Preterm  0   AB  1   Living  1     SAB  1   TAB  0   Ectopic  0   Multiple  0   Live Births  1            Home Medications    Prior to Admission medications   Medication Sig Start Date End Date Taking? Authorizing Provider  busPIRone (BUSPAR) 15 MG tablet Take 1 tablet (15 mg total) by mouth 3 (three) times daily. 11/13/18   Johnn Hai, MD  EPINEPHrine 0.3 mg/0.3 mL IJ SOAJ injection Inject 0.3 mg into the muscle as needed for anaphylaxis.     [provider]  EPINEPHrine 0.3 mg/0.3 mL IJ SOAJ injection Inject 0.3 mLs (0.3 mg total) into the muscle as needed for anaphylaxis. 04/01/19   Horton, Barbette Hair, MD  famotidine (PEPCID) 20 MG tablet Take 1 tablet (20 mg total) by mouth 2 (two) times daily. 04/01/19   Horton, Barbette Hair, MD  FLUoxetine (PROZAC) 20 MG capsule Take 1 capsule (20 mg total) by mouth daily. 11/14/18   Johnn Hai, MD  hydrOXYzine (ATARAX/VISTARIL) 25 MG tablet Take 1 tablet (25 mg total) by mouth every 6 (six) hours as needed. Patient taking differently: Take 25 mg by mouth every 6 (six) hours as needed for anxiety.  02/06/19   LampteyMyrene Galas, MD  hydrOXYzine (ATARAX/VISTARIL) 25 MG tablet Take 1 tablet (25 mg total) by mouth every 6 (six) hours as needed for itching. 04/01/19   Horton, Barbette Hair, MD  labetalol (NORMODYNE) 200 MG tablet Take 1 tablet (200 mg total) by mouth 3 (three) times daily. 07/02/18   Leftwich-Kirby, Kathie Dike, CNM  levETIRAcetam (KEPPRA) 750 MG tablet Take 750 mg by mouth 2 (two) times daily. 11/14/18   [provider]  omeprazole (PRILOSEC) 20 MG capsule Take 1 capsule (20 mg total) by mouth daily. 03/05/19   Tasia Catchings, Amy V, PA-C  ondansetron (ZOFRAN ODT) 4 MG disintegrating tablet Take 1 tablet (4 mg total) by mouth every 8 (eight) hours as needed for nausea or vomiting. 03/05/19   Tasia Catchings, Amy V, PA-C  prazosin (MINIPRESS) 2 MG capsule Take 2 capsules (4 mg total) by  mouth at bedtime. 11/13/18   Johnn Hai, MD  predniSONE (DELTASONE) 20 MG tablet Take 2 tablets (40 mg total) by mouth daily. 04/01/19   Horton, Barbette Hair, MD  temazepam (RESTORIL) 30 MG capsule Take 1 capsule (30 mg total) by mouth at bedtime. 11/13/18   Johnn Hai, MD    Family History Family History  Problem Relation Age of Onset  . Heart attack Mother   . Stroke Mother   . Liver cancer Mother   . Diabetes Mother   .  Lung cancer Mother   . Alcoholism Father   . Sleep apnea Brother   . Depression Brother   . ADD / ADHD Brother   . Diabetes Maternal Aunt   . Diabetes Paternal Uncle   . Colon cancer Paternal Uncle   . Diabetes Maternal Grandmother   . Dementia Maternal Grandmother     Social History Social History   Tobacco Use  . Smoking status: Current Every Day Smoker    Packs/day: 0.50    Years: 26.00    Pack years: 13.00    Types: Cigarettes  . Smokeless tobacco: Never Used  Substance Use Topics  . Alcohol use: Not Currently    Frequency: Never  . Drug use: Not Currently    Types: Marijuana    Comment:  last used in Jan 2019 due to "RA" per pt     Allergies   Aspartame and phenylalanine, Benadryl [diphenhydramine], Other, Scallops [shellfish allergy], Yellow jacket venom [bee venom], Pollen extract, Tegretol [carbamazepine], Adhesive [tape], and Latex   Review of Systems Review of Systems  Constitutional: Negative for chills and fever.  Respiratory: Negative for shortness of breath.   Cardiovascular: Negative for chest pain.  Gastrointestinal: Positive for nausea and vomiting.  Musculoskeletal: Negative for back pain.  Neurological: Negative for weakness, numbness and headaches.  All other systems reviewed and are negative.    Physical Exam Updated Vital Signs BP 137/86 (BP Location: Right Arm)   Pulse 81   Temp 98.2 F (36.8 C) (Oral)   Resp 16   SpO2 97%   Physical Exam Vitals signs and nursing note reviewed.  Constitutional:       General: She is not in acute distress.    Appearance: She is well-developed. She is not diaphoretic.  HENT:     Head: Normocephalic and atraumatic.  Eyes:     General: No scleral icterus.       Right eye: No discharge.        Left eye: No discharge.     Conjunctiva/sclera: Conjunctivae normal.  Neck:     Musculoskeletal: Normal range of motion and neck supple.  Cardiovascular:     Rate and Rhythm: Normal rate and regular rhythm.  Pulmonary:     Effort: Pulmonary effort is normal. No respiratory distress.     Breath sounds: No stridor.  Abdominal:     General: There is no distension.     Palpations: Abdomen is soft.     Tenderness: There is no abdominal tenderness.  Musculoskeletal:        General: No deformity.  Skin:    General: Skin is warm and dry.  Neurological:     Mental Status: She is alert.     Motor: No abnormal muscle tone.  Psychiatric:        Mood and Affect: Mood normal.        Behavior: Behavior normal.     Comments: Pleasant, cooperative      ED Treatments / Results  Labs (all labs ordered are listed, but only abnormal results are displayed) Labs Reviewed  POC URINE PREG, ED    EKG None  Radiology No results found.  Procedures Procedures (including critical care time)  Medications Ordered in ED Medications - No data to display   Initial Impression / Assessment and Plan / ED Course  I have reviewed the triage vital signs and the nursing notes.  Pertinent labs & imaging results that were available during my care of the patient were reviewed  by me and considered in my medical decision making (see chart for details).       Patient presents today for concern of pregnancy for 4 months.  Urine pregnancy test is negative.  In addition she has an IUD.  She frequently reports that urine pregnancy tests are negative for her and she requires a blood test.  She is not having any vaginal bleeding, pelvic pain, or other concerns were a pregnancy test  would change management if it were positive.    Recommended bland diet, and outpatient OB/GYN follow up.  She does not have an acute abdomen her abdomen is soft, nontender, nondistended.  Given that she has been able to tolerate chicken nuggets for the past 63-month I have a low suspicion for an obstruction or other serious cause of her reported vomiting.  Recommended a bland diet.    Patient is hemodynamically stable and does not appear to have an emergent/life threatening condition at this time.    Return precautions were discussed with patient who states their understanding.  At the time of discharge patient denied any unaddressed complaints or concerns.  Patient is agreeable for discharge home.   Final Clinical Impressions(s) / ED Diagnoses   Final diagnoses:  Negative pregnancy test    ED Discharge Orders    None       Ollen Gross 07/14/19 1553    Virgel Manifold, MD 07/14/19 1956

## 2019-07-14 NOTE — ED Triage Notes (Signed)
Pt states she would to have a pregnancy test due to feeling nausea and having body aches and felt like this with last pregnancy. Has not taking any tests.

## 2019-07-14 NOTE — Discharge Instructions (Addendum)
You can repeat a pregnancy test at home in one week.  You can also follow up with your Ob/gyn in the office for a blood pregnancy test and to verify your IUD is in place.   If you develop sustained fevers over 100.4 or have other concerns please seek additional medical care and evaluation.

## 2019-08-06 ENCOUNTER — Other Ambulatory Visit: Payer: Self-pay

## 2019-08-06 DIAGNOSIS — Z20822 Contact with and (suspected) exposure to covid-19: Secondary | ICD-10-CM

## 2019-08-08 LAB — NOVEL CORONAVIRUS, NAA: SARS-CoV-2, NAA: NOT DETECTED

## 2019-08-09 ENCOUNTER — Other Ambulatory Visit: Payer: Self-pay

## 2019-08-09 ENCOUNTER — Encounter (HOSPITAL_COMMUNITY): Payer: Self-pay | Admitting: Emergency Medicine

## 2019-08-09 ENCOUNTER — Emergency Department (HOSPITAL_COMMUNITY)
Admission: EM | Admit: 2019-08-09 | Discharge: 2019-08-09 | Disposition: A | Discharge status: Home / Self Care | Payer: Medicare (Managed Care) | Attending: Emergency Medicine | Admitting: Emergency Medicine

## 2019-08-09 DIAGNOSIS — R4182 Altered mental status, unspecified: Secondary | ICD-10-CM | POA: Diagnosis present

## 2019-08-09 DIAGNOSIS — F17210 Nicotine dependence, cigarettes, uncomplicated: Secondary | ICD-10-CM | POA: Diagnosis not present

## 2019-08-09 DIAGNOSIS — R413 Other amnesia: Secondary | ICD-10-CM | POA: Insufficient documentation

## 2019-08-09 DIAGNOSIS — J45909 Unspecified asthma, uncomplicated: Secondary | ICD-10-CM | POA: Diagnosis not present

## 2019-08-09 DIAGNOSIS — Z91040 Latex allergy status: Secondary | ICD-10-CM | POA: Insufficient documentation

## 2019-08-09 DIAGNOSIS — R519 Headache, unspecified: Secondary | ICD-10-CM | POA: Insufficient documentation

## 2019-08-09 DIAGNOSIS — Z79899 Other long term (current) drug therapy: Secondary | ICD-10-CM | POA: Insufficient documentation

## 2019-08-09 DIAGNOSIS — M25571 Pain in right ankle and joints of right foot: Secondary | ICD-10-CM | POA: Diagnosis not present

## 2019-08-09 LAB — COMPREHENSIVE METABOLIC PANEL
ALT: 12 U/L (ref 0–44)
AST: 16 U/L (ref 15–41)
Albumin: 3.8 g/dL (ref 3.5–5.0)
Alkaline Phosphatase: 93 U/L (ref 38–126)
Anion gap: 8 (ref 5–15)
BUN: 14 mg/dL (ref 6–20)
CO2: 27 mmol/L (ref 22–32)
Calcium: 9.3 mg/dL (ref 8.9–10.3)
Chloride: 104 mmol/L (ref 98–111)
Creatinine, Ser: 0.69 mg/dL (ref 0.44–1.00)
GFR calc Af Amer: >60 mL/min (ref >60)
GFR calc non Af Amer: >60 mL/min (ref >60)
Glucose, Bld: 78 mg/dL (ref 70–99)
Potassium: 4.1 mmol/L (ref 3.5–5.1)
Sodium: 139 mmol/L (ref 135–145)
Total Bilirubin: 0.5 mg/dL (ref 0.3–1.2)
Total Protein: 6.8 g/dL (ref 6.5–8.1)

## 2019-08-09 LAB — URINALYSIS, ROUTINE W REFLEX MICROSCOPIC
Bilirubin Urine: NEGATIVE
Glucose, UA: NEGATIVE mg/dL
Hgb urine dipstick: NEGATIVE
Ketones, ur: NEGATIVE mg/dL
Leukocytes,Ua: NEGATIVE
Nitrite: NEGATIVE
Protein, ur: NEGATIVE mg/dL
Specific Gravity, Urine: 1.023 (ref 1.005–1.030)
pH: 5 (ref 5.0–8.0)

## 2019-08-09 LAB — CBC
HCT: 46.8 % — ABNORMAL HIGH (ref 36.0–46.0)
Hemoglobin: 15.2 g/dL — ABNORMAL HIGH (ref 12.0–15.0)
MCH: 30.3 pg (ref 26.0–34.0)
MCHC: 32.5 g/dL (ref 30.0–36.0)
MCV: 93.4 fL (ref 80.0–100.0)
Platelets: 306 10*3/uL (ref 150–400)
RBC: 5.01 MIL/uL (ref 3.87–5.11)
RDW: 13.6 % (ref 11.5–15.5)
WBC: 11.9 10*3/uL — ABNORMAL HIGH (ref 4.0–10.5)
nRBC: 0 % (ref 0.0–0.2)

## 2019-08-09 LAB — RAPID URINE DRUG SCREEN, HOSP PERFORMED
Amphetamines: NOT DETECTED
Barbiturates: NOT DETECTED
Benzodiazepines: NOT DETECTED
Cocaine: NOT DETECTED
Opiates: NOT DETECTED
Tetrahydrocannabinol: NOT DETECTED

## 2019-08-09 LAB — CBG MONITORING, ED: Glucose-Capillary: 76 mg/dL (ref 70–99)

## 2019-08-09 LAB — I-STAT BETA HCG BLOOD, ED (MC, WL, AP ONLY): I-stat hCG, quantitative: <5 m[IU]/mL (ref <5)

## 2019-08-09 MED ORDER — LEVETIRACETAM 750 MG PO TABS: 750.0000 mg | ORAL_TABLET | Freq: Two times a day (BID) | ORAL | 2 refills | Status: DC

## 2019-08-09 NOTE — ED Triage Notes (Signed)
Pt reports that she can't remember her boyfriends name or much. States that makes him scared and upset. She knows she lives with him as he is always around and takes care of her and hugs and kisses her.  Pt c/o headache and right ankle pains.

## 2019-08-09 NOTE — ED Notes (Signed)
Blood draw attempted x2 unsuccessful 

## 2019-08-09 NOTE — Discharge Instructions (Addendum)
1.  I suspect your memory problems today are due to seizures and pre-existing psychiatric conditions with anxiety and PTSD.  2.  A level was drawn for Keppra, your seizure medicine listed in your chart.  The level will not be available for several days.  You may find the results in your "Mychart".  In formation how to access this if you have not already is included in your discharge instructions. 3.  Is very important you follow-up with your doctor and your neurologist, Dr. Lavell Anchors.  She has seen you in the past and is monitoring your Keppra and seizures.  Call her office on Monday to schedule a follow-up appointment.  You should continue to take your Keppra 750 mg twice daily as prescribed.  A refill was submitted to [facility] on [address] where you have previously filled this prescription.

## 2019-08-09 NOTE — ED Provider Notes (Signed)
[facility] DEPT Provider Note   CSN: [id number] Arrival date & time: 08/09/19  1322     History Chief Complaint  Patient presents with  . Altered Mental Status  . Headache  . Ankle Pain    right    Crystallee Mancillas is a 38 y.o. female.  HPI Patient reports that she has come to the emergency department because she cannot remember anything.  She is not exactly sure how long the symptoms have been going on but she thinks based on conversations with her boyfriend that is been since Friday (day before yesterday).  Patient has history of seizure disorder.  She reports she knows she has seizures but she does not really remember much about the diagnosis.  She knows she takes medications but she cannot remember which medications she takes.  She reports her boyfriend said that she did have some seizures.  She reports however her postictal periods normally do not last this long so she does not think the problem is post ictal seizure issues.  She states that she spent some time writing down important information in a notebook while she was waiting to come back to the emergency department to talk about her memory loss. She documented  that she likes to write, that the unicorn is her favorite animal, that she lives with her boyfriend and she wrote down his name and his number.  There were several other lines of documentation.  Writing in the notebook is very neat and organized.  Patient thinks she might be from [facility].  She reports she has some baseball hats at home with "B"s on them for the [employer] team.  She reports that she is pretty sure she has a child with her boyfriend.  She reports her pictures of a 59-year-old birthday party.  She denies any focal weakness numbness or tingling.  She denies that she has felt sick that she is aware of although qualifying that she really just cannot remember.    Past Medical History:  Diagnosis Date  . Anxiety   . Asthma     . Asthma due to environmental allergies   . Asthma due to seasonal allergies   . Bipolar 1 disorder ([place])   . Borderline personality disorder ([place])   . Brain bleed ([place])   . Chronic post-traumatic stress disorder (PTSD)    complex chronic with psychotic features and self harm behaviors  . Complication of anesthesia   . Constipation   . Dander (animal) allergy   . Hearing loss    right ear  . Heart murmur    denies seeing a cardiologist  . Hip dysplasia   . Hypertension    Gestional   . Major depression, chronic   . Mood disorder ([place])   . OCD (obsessive compulsive disorder)   . Pneumonia   . PONV (postoperative nausea and vomiting)   . PTSD (post-traumatic stress disorder)   . RA (rheumatoid arthritis) ([place])   . Rheumatoid arthritis ([place])   . Schizophrenia ([place])    "I think that is wrong"  . Seizure disorder ([place])   . Suicidal ideations   . Suicide attempt [facility])     Patient Active Problem List   Diagnosis Date Noted  . Major depressive disorder, recurrent episode, severe ([place]) 11/13/2018  . Bipolar 1 disorder, depressed ([place]) 11/10/2018  . Seizures ([place]) 09/23/2018  . Disruption of episiotomy wound in the puerperium 09/04/2018  . Postpartum care following vaginal delivery 07/28/2018  . Encounter  for IUD insertion 07/24/2018  . Gestational hypertension 06/18/2018  . Pap smear of cervix shows high risk HPV present 01/14/2018  . Supervision of high risk pregnancy, antepartum 01/08/2018  . Seizure disorder during pregnancy ([place]) 01/08/2018  . Advanced maternal age, primigravida 01/08/2018  . Bipolar 2 disorder ([place]) 01/08/2018  . Borderline personality disorder ([place]) 01/08/2018  . Major depression, chronic 01/08/2018  . PTSD (post-traumatic stress disorder) 01/08/2018  . OCD (obsessive compulsive disorder) 01/08/2018  . Asthma   . Seizure disorder ([place]) 11/06/2017  . Schizoaffective disorder, depressive type ([place]) 08/16/2017    Past Surgical History:  Procedure  Laterality Date  . Brain Shunt  1981   "a few hours old"  . EYE SURGERY Bilateral 1985  . INTRAUTERINE DEVICE (IUD) INSERTION N/A 09/16/2018   Procedure: INTRAUTERINE DEVICE (IUD) INSERTION;  Surgeon: Lavonia Drafts, MD;  Location: [facility];  Service: Gynecology;  Laterality: N/A;  . PERINEUM REPAIR N/A 09/16/2018   Procedure: EPISIOTOMY REVISION;  Surgeon: Lavonia Drafts, MD;  Location: [facility];  Service: Gynecology;  Laterality: N/A;  . [place]  . Tooth Removal     multiple     OB History    Gravida  2   Para  1   Term  1   Preterm  0   AB  1   Living  1     SAB  1   TAB  0   Ectopic  0   Multiple  0   Live Births  1           Family History  Problem Relation Age of Onset  . Heart attack Mother   . Stroke Mother   . Liver cancer Mother   . Diabetes Mother   . Lung cancer Mother   . Alcoholism Father   . Sleep apnea Brother   . Depression Brother   . ADD / ADHD Brother   . Diabetes Maternal Aunt   . Diabetes Paternal Uncle   . Colon cancer Paternal Uncle   . Diabetes Maternal Grandmother   . Dementia Maternal Grandmother     Social History   Tobacco Use  . Smoking status: Current Every Day Smoker    Packs/day: 0.50    Years: 26.00    Pack years: 13.00    Types: Cigarettes  . Smokeless tobacco: Never Used  Substance Use Topics  . Alcohol use: Not Currently  . Drug use: Not Currently    Types: Marijuana    Comment:  last used in Jan 2019 due to "RA" per pt    Home Medications Prior to Admission medications   Medication Sig Start Date End Date Taking? Authorizing Provider  busPIRone (BUSPAR) 15 MG tablet Take 1 tablet (15 mg total) by mouth 3 (three) times daily. 11/13/18   Johnn Hai, MD  EPINEPHrine 0.3 mg/0.3 mL IJ SOAJ injection Inject 0.3 mg into the muscle as needed for anaphylaxis.     [provider]  EPINEPHrine 0.3 mg/0.3 mL IJ SOAJ injection Inject 0.3 mLs (0.3 mg total) into the muscle as  needed for anaphylaxis. 04/01/19   Horton, Barbette Hair, MD  famotidine (PEPCID) 20 MG tablet Take 1 tablet (20 mg total) by mouth 2 (two) times daily. 04/01/19   Horton, Barbette Hair, MD  FLUoxetine (PROZAC) 20 MG capsule Take 1 capsule (20 mg total) by mouth daily. 11/14/18   Johnn Hai, MD  hydrOXYzine (ATARAX/VISTARIL) 25 MG tablet Take 1 tablet (25 mg total) by mouth  every 6 (six) hours as needed. Patient taking differently: Take 25 mg by mouth every 6 (six) hours as needed for anxiety.  02/06/19   LampteyMyrene Galas, MD  hydrOXYzine (ATARAX/VISTARIL) 25 MG tablet Take 1 tablet (25 mg total) by mouth every 6 (six) hours as needed for itching. 04/01/19   Horton, Barbette Hair, MD  labetalol (NORMODYNE) 200 MG tablet Take 1 tablet (200 mg total) by mouth 3 (three) times daily. 07/02/18   Leftwich-Kirby, Kathie Dike, CNM  levETIRAcetam (KEPPRA) 750 MG tablet Take 750 mg by mouth 2 (two) times daily. 11/14/18   [provider]  omeprazole (PRILOSEC) 20 MG capsule Take 1 capsule (20 mg total) by mouth daily. 03/05/19   Tasia Catchings, Amy V, PA-C  ondansetron (ZOFRAN ODT) 4 MG disintegrating tablet Take 1 tablet (4 mg total) by mouth every 8 (eight) hours as needed for nausea or vomiting. 03/05/19   Tasia Catchings, Amy V, PA-C  prazosin (MINIPRESS) 2 MG capsule Take 2 capsules (4 mg total) by mouth at bedtime. 11/13/18   Johnn Hai, MD  predniSONE (DELTASONE) 20 MG tablet Take 2 tablets (40 mg total) by mouth daily. 04/01/19   Horton, Barbette Hair, MD  temazepam (RESTORIL) 30 MG capsule Take 1 capsule (30 mg total) by mouth at bedtime. 11/13/18   Johnn Hai, MD    Allergies    Aspartame and phenylalanine, Benadryl [diphenhydramine], Other, Scallops [shellfish allergy], Yellow jacket venom [bee venom], Pollen extract, Tegretol [carbamazepine], Adhesive [tape], and Latex  Review of Systems   Review of Systems 10 Systems reviewed and are negative for acute change except as noted in the HPI.  Physical Exam Updated Vital Signs BP  128/71 (BP Location: Left Arm)   Pulse 66   Temp 98.4 F (36.9 C) (Oral)   Resp 17   SpO2 100%   Physical Exam Constitutional:      Appearance: Normal appearance.     Comments: Patient is clinically well in appearance.  She is alert and cheerful.  Well-groomed well-dressed.  She is surrounded by several personal items she is brought with her.  She has a pencil box with various items in it and a notebook.  HENT:     Head: Normocephalic and atraumatic.     Nose: Nose normal.     Mouth/Throat:     Mouth: Mucous membranes are moist.     Pharynx: Oropharynx is clear.  Eyes:     Extraocular Movements: Extraocular movements intact.     Conjunctiva/sclera: Conjunctivae normal.     Pupils: Pupils are equal, round, and reactive to light.  Cardiovascular:     Rate and Rhythm: Normal rate and regular rhythm.  Pulmonary:     Effort: Pulmonary effort is normal.     Breath sounds: Normal breath sounds.  Abdominal:     General: There is no distension.     Palpations: Abdomen is soft.     Tenderness: There is no abdominal tenderness. There is no guarding.  Musculoskeletal:        General: No swelling or tenderness. Normal range of motion.     Cervical back: Neck supple.     Right lower leg: No edema.     Left lower leg: No edema.     Comments: Patient can elevate each lower extremity off of the bed independently and hold against resistance.  She can also dorsa flex and extend at the ankles with good strength.  5\5 grip strength.  Normal coordinated and brisk finger-nose exam bilaterally.  (  There was no necessary instructing on this exercise, patient did it quickly and accurately)  Skin:    General: Skin is warm and dry.  Neurological:     General: No focal deficit present.     Mental Status: She is alert.     Cranial Nerves: No cranial nerve deficit.     Sensory: No sensory deficit.     Motor: No weakness.     Coordination: Coordination normal.     Comments: Patient has a wheelchair in  the room.  She reports she uses that due to leg weakness.  Her exam for motor testing in the bed was 5\5 upper and lower extremities.  Brisk and accurate finger-nose exam.  Speech is clear and fluent.  Psychiatric:     Comments: Mood is pleasant to elevated.     ED Results / Procedures / Treatments   Labs (all labs ordered are listed, but only abnormal results are displayed) Labs Reviewed  COMPREHENSIVE METABOLIC PANEL  CBC  URINALYSIS, ROUTINE W REFLEX MICROSCOPIC  RAPID URINE DRUG SCREEN, HOSP PERFORMED  CBG MONITORING, ED  I-STAT BETA HCG BLOOD, ED ([facility], [facility], [facility] ONLY)    EKG None  Radiology No results found.  Procedures Procedures (including critical care time)  Medications Ordered in ED Medications - No data to display  ED Course  I have reviewed the triage vital signs and the nursing notes.  Pertinent labs & imaging results that were available during my care of the patient were reviewed by me and considered in my medical decision making (see chart for details).    MDM Rules/Calculators/A&P                      Patient presents with complaint of memory loss.  This is not a focal amnesia to the past several days.  She wanders around with reporting inability to remember where she grew up, what her boyfriend's name is, when she had a child, what medications she takes.  Patient however is very alert and interactive.  Her speech is fluent.  There is no hesitation or attempts at recall.  She has very neatly outlined a number of points that she wanted me to be able to be aware of in a notebook.  The writing is very clear, precise and organized.  Patient's neurologic exam is normal with good coordination.  She did not require any coaching to perform a brisk and accurate finger-nose exam.  Basic lab work was assessed.  No derangements.  Vital signs are normal.  Keppra level will be pending.  I have very low suspicion at this time for structural neurologic etiology, metabolic etiology,  infectious etiology or other emergent condition.  Strongest suspicion that this is in combination with underlying psychiatric illness and possibly seizures.  Patient is supposed to be taking Keppra.  Keppra level will be pending and recommendations to follow-up with Dr. Lavell Anchors.  She is also counseled to follow-up closely with her PCP.  Return precautions reviewed. Final Clinical Impression(s) / ED Diagnoses Final diagnoses:  Memory loss of unknown cause    Rx / DC Orders ED Discharge Orders    None       Charlesetta Shanks, MD 08/09/19 1705

## 2019-08-13 LAB — LEVETIRACETAM LEVEL: Levetiracetam Lvl: <1 ug/mL — ABNORMAL LOW (ref 10.0–40.0)

## 2019-08-14 ENCOUNTER — Emergency Department (HOSPITAL_COMMUNITY)
Admission: EM | Admit: 2019-08-14 | Discharge: 2019-08-15 | Disposition: A | Discharge status: Home / Self Care | Payer: Medicare (Managed Care) | Attending: Emergency Medicine | Admitting: Emergency Medicine

## 2019-08-14 DIAGNOSIS — F419 Anxiety disorder, unspecified: Secondary | ICD-10-CM | POA: Diagnosis not present

## 2019-08-14 DIAGNOSIS — F445 Conversion disorder with seizures or convulsions: Secondary | ICD-10-CM

## 2019-08-14 DIAGNOSIS — J45909 Unspecified asthma, uncomplicated: Secondary | ICD-10-CM | POA: Insufficient documentation

## 2019-08-14 DIAGNOSIS — F17210 Nicotine dependence, cigarettes, uncomplicated: Secondary | ICD-10-CM | POA: Insufficient documentation

## 2019-08-14 DIAGNOSIS — Z79899 Other long term (current) drug therapy: Secondary | ICD-10-CM | POA: Diagnosis not present

## 2019-08-14 DIAGNOSIS — F250 Schizoaffective disorder, bipolar type: Secondary | ICD-10-CM | POA: Insufficient documentation

## 2019-08-14 DIAGNOSIS — Z20828 Contact with and (suspected) exposure to other viral communicable diseases: Secondary | ICD-10-CM | POA: Insufficient documentation

## 2019-08-14 DIAGNOSIS — R4182 Altered mental status, unspecified: Secondary | ICD-10-CM | POA: Diagnosis present

## 2019-08-14 DIAGNOSIS — Z91040 Latex allergy status: Secondary | ICD-10-CM | POA: Insufficient documentation

## 2019-08-14 LAB — COMPREHENSIVE METABOLIC PANEL
ALT: 10 U/L (ref 0–44)
AST: 14 U/L — ABNORMAL LOW (ref 15–41)
Albumin: 3.9 g/dL (ref 3.5–5.0)
Alkaline Phosphatase: 87 U/L (ref 38–126)
Anion gap: 7 (ref 5–15)
BUN: 10 mg/dL (ref 6–20)
CO2: 24 mmol/L (ref 22–32)
Calcium: 9 mg/dL (ref 8.9–10.3)
Chloride: 110 mmol/L (ref 98–111)
Creatinine, Ser: 0.48 mg/dL (ref 0.44–1.00)
GFR calc Af Amer: >60 mL/min (ref >60)
GFR calc non Af Amer: >60 mL/min (ref >60)
Glucose, Bld: 82 mg/dL (ref 70–99)
Potassium: 4 mmol/L (ref 3.5–5.1)
Sodium: 141 mmol/L (ref 135–145)
Total Bilirubin: 1 mg/dL (ref 0.3–1.2)
Total Protein: 7.1 g/dL (ref 6.5–8.1)

## 2019-08-14 LAB — CBC WITH DIFFERENTIAL/PLATELET
Abs Immature Granulocytes: 0.05 10*3/uL (ref 0.00–0.07)
Basophils Absolute: 0.1 10*3/uL (ref 0.0–0.1)
Basophils Relative: 0 %
Eosinophils Absolute: 0.1 10*3/uL (ref 0.0–0.5)
Eosinophils Relative: 1 %
HCT: 50.1 % — ABNORMAL HIGH (ref 36.0–46.0)
Hemoglobin: 16.3 g/dL — ABNORMAL HIGH (ref 12.0–15.0)
Immature Granulocytes: 0 %
Lymphocytes Relative: 20 %
Lymphs Abs: 2.7 10*3/uL (ref 0.7–4.0)
MCH: 30.1 pg (ref 26.0–34.0)
MCHC: 32.5 g/dL (ref 30.0–36.0)
MCV: 92.6 fL (ref 80.0–100.0)
Monocytes Absolute: 0.8 10*3/uL (ref 0.1–1.0)
Monocytes Relative: 6 %
Neutro Abs: 9.9 10*3/uL — ABNORMAL HIGH (ref 1.7–7.7)
Neutrophils Relative %: 73 %
Platelets: 320 10*3/uL (ref 150–400)
RBC: 5.41 MIL/uL — ABNORMAL HIGH (ref 3.87–5.11)
RDW: 13.3 % (ref 11.5–15.5)
WBC: 13.6 10*3/uL — ABNORMAL HIGH (ref 4.0–10.5)
nRBC: 0 % (ref 0.0–0.2)

## 2019-08-14 LAB — RAPID URINE DRUG SCREEN, HOSP PERFORMED
Amphetamines: NOT DETECTED
Barbiturates: NOT DETECTED
Benzodiazepines: NOT DETECTED
Cocaine: NOT DETECTED
Opiates: NOT DETECTED
Tetrahydrocannabinol: NOT DETECTED

## 2019-08-14 LAB — RESPIRATORY PANEL BY RT PCR (FLU A&B, COVID)
Influenza A by PCR: NEGATIVE
Influenza B by PCR: NEGATIVE
SARS Coronavirus 2 by RT PCR: NEGATIVE

## 2019-08-14 LAB — ETHANOL: Alcohol, Ethyl (B): <10 mg/dL (ref <10)

## 2019-08-14 MED ORDER — LEVETIRACETAM 500 MG PO TABS
750.0000 mg | ORAL_TABLET | Freq: Two times a day (BID) | ORAL | Status: DC
  Administered 2019-08-14 – 2019-08-15 (×2): 750 mg via ORAL
  Filled 2019-08-14 (×2): qty 1

## 2019-08-14 MED ORDER — LABETALOL HCL 200 MG PO TABS
200.0000 mg | ORAL_TABLET | Freq: Three times a day (TID) | ORAL | Status: DC
  Administered 2019-08-14 – 2019-08-15 (×2): 200 mg via ORAL
  Filled 2019-08-14 (×2): qty 1

## 2019-08-14 NOTE — ED Triage Notes (Signed)
Pt brought to [facility] ED by EMS. VS stable and BS stable with EMS.  According to EMS pt has behavioral issues and pseudo seizures.  Pt has eyes closed, will not open eyes. Pt moving head continuously, will not move left arm.

## 2019-08-14 NOTE — ED Notes (Signed)
Pt alert, calm, cooperative, no s/s of distress. Pt eating dinner. Pt smiling, answering questions.

## 2019-08-14 NOTE — ED Notes (Signed)
Pt resting comfortably in bed

## 2019-08-14 NOTE — ED Provider Notes (Signed)
Amanda [name] DEPT Provider Note   CSN: [id number] Arrival date & time: 08/14/19  [id number]     History Chief Complaint  Patient presents with  . Medical Clearance    pseudoseisuzures    Amanda [name] is a 38 y.o. 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  My review of EMR patient has been seen by neurologist most recently in February where she was placed on Keppra 750 twice daily.  She is followed by Dr. Sarina Ill with [facility].  Had MRI w/ and w/o contrast 11/17/2017 that was normal.   Attempted to contact her partner Legrand Como but no answer. Voicemail is not set up. Will re-attempt.      Past Medical History:  Diagnosis Date  . Anxiety   . Asthma   . Asthma due to environmental allergies   . Asthma due to seasonal allergies   . Bipolar 1 disorder ([place])   . Borderline personality disorder ([place])   . Brain bleed ([place])   . Chronic post-traumatic stress disorder (PTSD)    complex chronic with psychotic features and self harm behaviors  . Complication of anesthesia   . Constipation   . Dander (animal) allergy   . Hearing loss    right ear  . Heart murmur    denies seeing a cardiologist  . Hip dysplasia   . Hypertension    Gestional   . Major depression, chronic   . Mood disorder ([place])   . OCD (obsessive compulsive disorder)   . Pneumonia   . PONV (postoperative nausea and vomiting)   . PTSD (post-traumatic stress disorder)   . RA (rheumatoid arthritis) ([place])   . Rheumatoid arthritis ([place])   . Schizophrenia ([place])    "I think that is wrong"  . Seizure  disorder ([place])   . Suicidal ideations   . Suicide attempt [facility])     Patient Active Problem List   Diagnosis Date Noted  . Major depressive disorder, recurrent episode, severe ([place]) 11/13/2018  . Bipolar 1 disorder, depressed ([place]) 11/10/2018  . Seizures ([place]) 09/23/2018  . Disruption of episiotomy wound in the puerperium 09/04/2018  . Postpartum care following vaginal delivery 07/28/2018  . Encounter for IUD insertion 07/24/2018  . Gestational hypertension 06/18/2018  . Pap smear of cervix shows high risk HPV present 01/14/2018  . Supervision of high risk pregnancy, antepartum 01/08/2018  . Seizure disorder during pregnancy ([place]) 01/08/2018  . Advanced maternal age, primigravida 01/08/2018  . Bipolar 2 disorder ([place]) 01/08/2018  . Borderline personality disorder ([place]) 01/08/2018  . Major depression, chronic 01/08/2018  . PTSD (post-traumatic stress disorder) 01/08/2018  . OCD (obsessive compulsive disorder) 01/08/2018  . Asthma   . Seizure disorder ([place]) 11/06/2017  . Schizoaffective disorder, depressive type ([place]) 08/16/2017    Past Surgical History:  Procedure Laterality Date  . Brain Shunt  1981   "a few hours old"  . EYE SURGERY Bilateral 1985  . INTRAUTERINE DEVICE (IUD) INSERTION N/A 09/16/2018   Procedure: INTRAUTERINE DEVICE (IUD) INSERTION;  Surgeon: Lavonia Drafts, MD;  Location: [facility];  Service: Gynecology;  Laterality: N/A;  . PERINEUM REPAIR N/A 09/16/2018   Procedure: EPISIOTOMY REVISION;  Surgeon: Lavonia Drafts,  MD;  Location: [facility];  Service: Gynecology;  Laterality: N/A;  . [place]  . Tooth Removal     multiple     OB History    Gravida  2   Para  1   Term  1   Preterm  0   AB  1   Living  1     SAB  1   TAB  0   Ectopic  0   Multiple  0   Live Births  1           Family History  Problem Relation Age of Onset  . Heart attack Mother   . Stroke Mother   . Liver cancer Mother   . Diabetes Mother     . Lung cancer Mother   . Alcoholism Father   . Sleep apnea Brother   . Depression Brother   . ADD / ADHD Brother   . Diabetes Maternal Aunt   . Diabetes Paternal Uncle   . Colon cancer Paternal Uncle   . Diabetes Maternal Grandmother   . Dementia Maternal Grandmother     Social History   Tobacco Use  . Smoking status: Current Every Day Smoker    Packs/day: 0.50    Years: 26.00    Pack years: 13.00    Types: Cigarettes  . Smokeless tobacco: Never Used  Substance Use Topics  . Alcohol use: Not Currently  . Drug use: Not Currently    Types: Marijuana    Comment:  last used in Jan 2019 due to "RA" per pt    Home Medications Prior to Admission medications   Medication Sig Start Date End Date Taking? Authorizing Provider  levETIRAcetam (KEPPRA) 750 MG tablet Take 1 tablet (750 mg total) by mouth 2 (two) times daily. 08/09/19  Yes Charlesetta Shanks, MD  busPIRone (BUSPAR) 15 MG tablet Take 1 tablet (15 mg total) by mouth 3 (three) times daily. Patient not taking: Reported on 08/14/2019 11/13/18   Johnn Hai, MD  EPINEPHrine 0.3 mg/0.3 mL IJ SOAJ injection Inject 0.3 mLs (0.3 mg total) into the muscle as needed for anaphylaxis. Patient not taking: Reported on 08/14/2019 04/01/19   Horton, Barbette Hair, MD  famotidine (PEPCID) 20 MG tablet Take 1 tablet (20 mg total) by mouth 2 (two) times daily. Patient not taking: Reported on 08/14/2019 04/01/19   Horton, Barbette Hair, MD  FLUoxetine (PROZAC) 20 MG capsule Take 1 capsule (20 mg total) by mouth daily. Patient not taking: Reported on 08/14/2019 11/14/18   Johnn Hai, MD  hydrOXYzine (ATARAX/VISTARIL) 25 MG tablet Take 1 tablet (25 mg total) by mouth every 6 (six) hours as needed. Patient not taking: Reported on 08/14/2019 02/06/19   Chase Picket, MD  hydrOXYzine (ATARAX/VISTARIL) 25 MG tablet Take 1 tablet (25 mg total) by mouth every 6 (six) hours as needed for itching. Patient not taking: Reported on 08/14/2019 04/01/19   Horton,  Barbette Hair, MD  labetalol (NORMODYNE) 200 MG tablet Take 1 tablet (200 mg total) by mouth 3 (three) times daily. Patient not taking: Reported on 08/14/2019 07/02/18   Elvera Maria, CNM  omeprazole (PRILOSEC) 20 MG capsule Take 1 capsule (20 mg total) by mouth daily. Patient not taking: Reported on 08/14/2019 03/05/19   Ok Edwards, PA-C  ondansetron (ZOFRAN ODT) 4 MG disintegrating tablet Take 1 tablet (4 mg total) by mouth every 8 (eight) hours as needed for nausea or vomiting. Patient not taking: Reported on 08/14/2019 03/05/19  Tasia Catchings, Amy V, PA-C  prazosin (MINIPRESS) 2 MG capsule Take 2 capsules (4 mg total) by mouth at bedtime. Patient not taking: Reported on 08/14/2019 11/13/18   Johnn Hai, MD  predniSONE (DELTASONE) 20 MG tablet Take 2 tablets (40 mg total) by mouth daily. Patient not taking: Reported on 08/14/2019 04/01/19   Horton, Barbette Hair, MD  temazepam (RESTORIL) 30 MG capsule Take 1 capsule (30 mg total) by mouth at bedtime. Patient not taking: Reported on 08/14/2019 11/13/18   Johnn Hai, MD    Allergies    Aspartame and phenylalanine, Benadryl [diphenhydramine], Other, Scallops [shellfish allergy], Yellow jacket venom [bee venom], Pollen extract, Tegretol [carbamazepine], Adhesive [tape], and Latex  Review of Systems   Review of Systems  Constitutional: Negative for chills and fever.  HENT: Negative for congestion.   Eyes: Negative for pain.  Respiratory: Negative for cough and shortness of breath.   Cardiovascular: Negative for chest pain and leg swelling.  Gastrointestinal: Negative for abdominal pain and vomiting.  Genitourinary: Negative for dysuria.  Musculoskeletal: Negative for myalgias.  Skin: Negative for rash.  Neurological: Negative for dizziness and headaches.  Psychiatric/Behavioral: Positive for confusion.    Physical Exam Updated Vital Signs BP 130/83 (BP Location: Right Arm)   Pulse 64   Temp 98.4 F (36.9 C) (Oral)   Resp 14   SpO2 96%     Physical Exam Vitals and nursing note reviewed.  Constitutional:      General: She is not in acute distress.    Comments: Respond to questions as she is experiencing symmetrical body jerking.  HENT:     Head: Normocephalic and atraumatic.     Nose: Nose normal.  Eyes:     General: No scleral icterus. Cardiovascular:     Rate and Rhythm: Normal rate and regular rhythm.     Pulses: Normal pulses.     Heart sounds: Normal heart sounds.  Pulmonary:     Effort: Pulmonary effort is normal. No respiratory distress.     Breath sounds: Normal breath sounds. No wheezing.  Abdominal:     Palpations: Abdomen is soft.     Tenderness: There is no abdominal tenderness.  Musculoskeletal:     Cervical back: Normal range of motion.     Right lower leg: No edema.     Left lower leg: No edema.  Skin:    General: Skin is warm and dry.     Capillary Refill: Capillary refill takes less than 2 seconds.  Neurological:     Mental Status: She is alert. Mental status is at baseline.     GCS: GCS eye subscore is 2. GCS verbal subscore is 1. GCS motor subscore is 5.     Comments: Patient withdraws from nailbed pressure.  Grimaces with sternal rub and opens eyes briefly and goes right hand towards chest indicating localization of pain.  Psychiatric:     Comments: Unable to assess     ED Results / Procedures / Treatments   Labs (all labs ordered are listed, but only abnormal results are displayed) Labs Reviewed  COMPREHENSIVE METABOLIC PANEL - Abnormal; Notable for the following components:      Result Value   AST 14 (*)    All other components within normal limits  CBC WITH DIFFERENTIAL/PLATELET - Abnormal; Notable for the following components:   WBC 13.6 (*)    RBC 5.41 (*)    Hemoglobin 16.3 (*)    HCT 50.1 (*)    Neutro Abs 9.9 (*)  All other components within normal limits  RESPIRATORY PANEL BY RT PCR (FLU A&B, COVID)  ETHANOL  RAPID URINE DRUG SCREEN, HOSP PERFORMED  LEVETIRACETAM  LEVEL  I-STAT BETA HCG BLOOD, ED ([facility], [facility], [facility] ONLY)  I-STAT BETA HCG BLOOD, ED ([facility], [facility], [facility] ONLY)  I-STAT BETA HCG BLOOD, ED ([facility], [facility], [facility] ONLY)    EKG None  Radiology No results found.  Procedures Procedures (including critical care time) ED ECG REPORT   Date: 08/14/2019  Rate: 54  Rhythm: normal sinus rhythm  QRS Axis: right  Intervals: QT prolonged  ST/T Wave abnormalities: nonspecific T wave changes  Conduction Disutrbances:none  Narrative Interpretation:   Old EKG Reviewed: changes noted prior EKG without T wave inversions.    I have personally reviewed the EKG tracing and agree with the computerized printout as noted.   Medications Ordered in ED Medications  labetalol (NORMODYNE) tablet 200 mg (has no administration in time range)  levETIRAcetam (KEPPRA) tablet 750 mg (has no administration in time range)    ED Course  I have reviewed the triage vital signs and the nursing notes.  Pertinent labs & imaging results that were available during my care of the patient were reviewed by me and considered in my medical decision making (see chart for details).    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  Patient has a history of such but is on Keppra per her neurologist who she last saw her 09/23/2018 and was suspecting pseudoseizure.  At that time she was doing well with Keppra.   We will obtain labs outpatient for medical clearance and consult TTS.  Will observe until patient is able to be interviewed.   Approximately 1:30 PM patient was swabbed for Covid and while being swabbed stopped seizing and became responsive.   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  Will not terminate with benzodiazepines as I suspect patient will stop  seizing without intervention.  Discussed case with Shanon Brow with TTS.  Will place TTS consult.  Patient appears dehydrated on lab work however she has no complaints.  According to Shanon Brow who discussed with Legrand Como patient's boyfriend: Patient typically has increased his seizures around Christmas of this was when she was adopted by her family and she associates this with her birthday.  Patient seems to have seizures when she is more anxious per bf. Unsure if patient is at baseline mental status.  TTS discussed with patient's boyfriend Legrand Como who appears to have intellectual delay and was unable to corroborate her baseline.   Repeat physical exam:  CONSTITUTIONAL:  well-appearing, NAD NEURO:   Patient with no focal neuro deficits however she is unable to tell me her last name, location, and date.  Is aware that is December.  Unable to answer how many quarters are in a buck twenty-five. EYES:  pupils equal and reactive ENT/NECK:  trachea midline, no JVD CARDIO:  reg rate, reg rhythm, well-perfused PULM:  None labored breathing GI/GU:  Abdomen non-distended MSK/SPINE:  No gross deformities, no edema SKIN:  no rash obvious, atraumatic, no ecchymosis  PSYCH:  Appropriate speech and behavior; mentally slow   TTS recommends observation in ED.  Will reorder patient's home labetalol and provide patient with home  Keppra.  Keppra level pending as it is a send out lab.  On repeat evaluation patient is lying comfortably in bed.  In no acute distress.  Cooperative and agreeable with observation.  Plan is to reassess patient in morning and disposition per behavioral health recommendation.   Final Clinical Impression(s) / ED Diagnoses Final diagnoses:  Anxiety  Pseudoseizure    Rx / DC Orders ED Discharge Orders    None       Tedd Sias, [place] 08/14/19 1904    Wyvonnia Dusky, MD 08/15/19 1022

## 2019-08-14 NOTE — ED Notes (Signed)
Because pt was unable to relax arm while collecting vitals, this writter took the blood pressure in both the arm and the leg for comparison.

## 2019-08-14 NOTE — ED Notes (Signed)
Pt alert and talking. Pt answering questions. Pt did not know where she was, pt was oriented. Pt states she does not remember. Pt was able to ambulate with assist.

## 2019-08-14 NOTE — ED Notes (Signed)
Has answered I dont remember to multiple questions writer asked her tonight. She is clear on not being suicidal or homicidal at this time. She states she came in because she was having seizures. States her confusion is not normal for her. She is pleasant and cooperative. Took HS meds and went to sleep. No behavior issues.
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
## 2019-08-15 NOTE — ED Notes (Signed)
Pt discharged home. Discharged instructions read to pt who verbalized understanding. All belongings returned to pt who signed for same. Denies SI/HI, is not delusional and not responding to internal stimuli. Escorted pt to the ED exit.    

## 2019-08-15 NOTE — Consult Note (Signed)
[facility] Consult   Reason for Consult: Nonepileptic seizures and complaints of amnestic issues Referring Physician: Dr. Lavone Orn Patient Identification: Amanda [name] MRN:  [id number] Principal Diagnosis: Axis II pathology/borderline Diagnosis:   Severity of Axis II pathology propelling issues listed above Total Time spent with patient: 45 minutes  Subjective:   Amanda [name] is a 38 y.o. female patient admitted with an extensive past psychiatric history, last hospitalized at our facility in March at that time she was described as "having numerous past diagnoses to include PTSD, bipolar disorder, schizoaffective disorder, anorexia, chronic cutting behaviors, pseudoseizures, and again severe borderline personality disorder that propels most of her presentations"  She presented on 12/20 complaining of amnestic episodes she represented on 12/25 after a pseudoseizure.  HPI:   As discussed the patient has an extensive psychiatric history she is currently followed by a neurologist she has been placed on Keppra, her MRI in 2019 was normal  At the present time the patient states "I still cannot remember things" but she is oriented to person place time and situation and is conversant and is pretty close to baseline.  She is reassured that amnestic periods can follow pseudoseizures as well as epileptic seizures, and she is encouraged to stay on her medications for her psychiatric and neurological issues, and she denies suicidal thoughts homicidal thoughts and is requesting discharge  Past Psychiatric History: Extensive as discussed  Risk to Self: Suicidal Ideation: Yes-Currently Present Suicidal Intent: No Is patient at risk for suicide?: Yes Suicidal Plan?: No Access to Means: No What has been your use of drugs/alcohol within the last 12 months?: Denies How many times?: (Multiple per chart) Other Self Harm Risks: (UTA) Triggers for Past Attempts: Unknown Intentional Self Injurious  Behavior: None Risk to Others: Homicidal Ideation: No Thoughts of Harm to Others: No Current Homicidal Intent: No Current Homicidal Plan: No Access to Homicidal Means: No Identified Victim: NA History of harm to others?: No Assessment of Violence: None Noted Violent Behavior Description: NA Does patient have access to weapons?: No Criminal Charges Pending?: No Does patient have a court date: No Prior Inpatient Therapy: Prior Inpatient Therapy: Yes Prior Therapy Dates: (Multiple dates) Prior Therapy Facilty/Provider(s): [facility], [facility] Reason for Treatment: MH issues Prior Outpatient Therapy: Prior Outpatient Therapy: Yes Prior Therapy Dates: 2019 Prior Therapy Facilty/Provider(s): [facility] Reason for Treatment: med mang Does patient have an ACCT team?: No Does patient have Intensive In-House Services?  : No Does patient have [facility] services? : No Does patient have P4CC services?: No  Past Medical History:  Past Medical History:  Diagnosis Date  . Anxiety   . Asthma   . Asthma due to environmental allergies   . Asthma due to seasonal allergies   . Bipolar 1 disorder ([place])   . Borderline personality disorder ([place])   . Brain bleed ([place])   . Chronic post-traumatic stress disorder (PTSD)    complex chronic with psychotic features and self harm behaviors  . Complication of anesthesia   . Constipation   . Dander (animal) allergy   . Hearing loss    right ear  . Heart murmur    denies seeing a cardiologist  . Hip dysplasia   . Hypertension    Gestional   . Major depression, chronic   . Mood disorder ([place])   . OCD (obsessive compulsive disorder)   . Pneumonia   . PONV (postoperative nausea and vomiting)   . PTSD (post-traumatic stress disorder)   . RA (  rheumatoid arthritis) ([place])   . Rheumatoid arthritis ([place])   . Schizophrenia ([place])    "I think that is wrong"  . Seizure disorder ([place])   . Suicidal ideations   . Suicide attempt [facility])      Past Surgical History:  Procedure Laterality Date  . Brain Shunt  1981   "a few hours old"  . EYE SURGERY Bilateral 1985  . INTRAUTERINE DEVICE (IUD) INSERTION N/A 09/16/2018   Procedure: INTRAUTERINE DEVICE (IUD) INSERTION;  Surgeon: Lavonia Drafts, MD;  Location: [facility];  Service: Gynecology;  Laterality: N/A;  . PERINEUM REPAIR N/A 09/16/2018   Procedure: EPISIOTOMY REVISION;  Surgeon: Lavonia Drafts, MD;  Location: [facility];  Service: Gynecology;  Laterality: N/A;  . [place]  . Tooth Removal     multiple   Family History:  Family History  Problem Relation Age of Onset  . Heart attack Mother   . Stroke Mother   . Liver cancer Mother   . Diabetes Mother   . Lung cancer Mother   . Alcoholism Father   . Sleep apnea Brother   . Depression Brother   . ADD / ADHD Brother   . Diabetes Maternal Aunt   . Diabetes Paternal Uncle   . Colon cancer Paternal Uncle   . Diabetes Maternal Grandmother   . Dementia Maternal Grandmother    Family Psychiatric  History: No new data shared Social History:  Social History   Substance and Sexual Activity  Alcohol Use Not Currently     Social History   Substance and Sexual Activity  Drug Use Not Currently  . Types: Marijuana   Comment:  last used in Jan 2019 due to "RA" per pt    Social History   Socioeconomic History  . Marital status: Legally Separated    Spouse name: Not on file  . Number of children: 1  . Years of education: Not on file  . Highest education level: Not on file  Occupational History  . Not on file  Tobacco Use  . Smoking status: Current Every Day Smoker    Packs/day: 0.50    Years: 26.00    Pack years: 13.00    Types: Cigarettes  . Smokeless tobacco: Never Used  Substance and Sexual Activity  . Alcohol use: Not Currently  . Drug use: Not Currently    Types: Marijuana    Comment:  last used in Jan 2019 due to "RA" per pt  . Sexual activity: Not Currently    Birth  control/protection: None    Comment: pregnant  Other Topics Concern  . Not on file  Social History Narrative   ** Merged History Encounter **       Lives with her fiance, friend, and fiance's brother   Right handed   Social Determinants of Health   Financial Resource Strain:   . Difficulty of Paying Living Expenses: Not on file  Food Insecurity:   . Worried About Charity fundraiser in the Last Year: Not on file  . Ran Out of Food in the Last Year: Not on file  Transportation Needs:   . Lack of Transportation (Medical): Not on file  . Lack of Transportation (Non-Medical): Not on file  Physical Activity:   . Days of Exercise per Week: Not on file  . Minutes of Exercise per Session: Not on file  Stress:   . Feeling of Stress : Not on file  Social Connections:   . Frequency of Communication  with Friends and Family: Not on file  . Frequency of Social Gatherings with Friends and Family: Not on file  . Attends Religious Services: Not on file  . Active Member of Clubs or Organizations: Not on file  . Attends Archivist Meetings: Not on file  . Marital Status: Not on file   Additional Social History:    Allergies:   Allergies  Allergen Reactions  . Aspartame And Phenylalanine Anaphylaxis, Hives and Diarrhea  . Benadryl [Diphenhydramine] Anaphylaxis, Diarrhea and Other (See Comments)    Blisters also  . Other Anaphylaxis, Nausea And Vomiting, Rash and Other (See Comments)    Aspartame- Blisters, also Dust- Worsens asthma Ragweed- Worsens asthma, face gets red, and sneezing Animal Fur/Dander- Worsens asthma and sneezing    . Scallops [Shellfish Allergy] Anaphylaxis, Diarrhea and Nausea And Vomiting  . Yellow Jacket Venom [Bee Venom] Anaphylaxis, Diarrhea and Nausea And Vomiting    Seizures and numbness  . Pollen Extract Other (See Comments)    Runny nose, eyes, and asthma worsens  . Tegretol [Carbamazepine] Hives, Diarrhea and Other (See Comments)    Blisters in  mouth and increase in seizures  . Adhesive [Tape] Rash  . Latex Hives and Rash    Blisters, also- Condoms and dental encounters    Labs:  Results for orders placed or performed during the hospital encounter of 08/14/19 (from the past 48 hour(s))  Respiratory Panel by RT PCR (Flu A&B, Covid) - Nasopharyngeal Swab     Status: None   Collection Time: 08/14/19 11:13 AM   Specimen: Nasopharyngeal Swab  Result Value Ref Range   SARS Coronavirus 2 by RT PCR NEGATIVE NEGATIVE    Comment: (NOTE) SARS-CoV-2 target nucleic acids are NOT DETECTED. The SARS-CoV-2 RNA is generally detectable in upper respiratoy specimens during the acute phase of infection. The lowest concentration of SARS-CoV-2 viral copies this assay can detect is 131 copies/mL. A negative result does not preclude SARS-Cov-2 infection and should not be used as the sole basis for treatment or other patient management decisions. A negative result may occur with  improper specimen collection/handling, submission of specimen other than nasopharyngeal swab, presence of viral mutation(s) within the areas targeted by this assay, and inadequate number of viral copies (<131 copies/mL). A negative result must be combined with clinical observations, patient history, and epidemiological information. The expected result is Negative. Fact Sheet for Patients:  PinkCheek.be Fact Sheet for Healthcare Providers:  GravelBags.it This test is not yet ap proved or cleared by the [place] FDA and  has been authorized for detection and/or diagnosis of SARS-CoV-2 by FDA under an Emergency Use Authorization (EUA). This EUA will remain  in effect (meaning this test can be used) for the duration of the COVID-19 declaration under Section 564(b)(1) of the Act, 21 U.S.C. section 360bbb-3(b)(1), unless the authorization is terminated or revoked sooner.    Influenza A by PCR NEGATIVE NEGATIVE    Influenza B by PCR NEGATIVE NEGATIVE    Comment: (NOTE) The Xpert Xpress SARS-CoV-2/FLU/RSV assay is intended as an aid in  the diagnosis of influenza from Nasopharyngeal swab specimens and  should not be used as a sole basis for treatment. Nasal washings and  aspirates are unacceptable for Xpert Xpress SARS-CoV-2/FLU/RSV  testing. Fact Sheet for Patients: PinkCheek.be Fact Sheet for Healthcare Providers: GravelBags.it This test is not yet approved or cleared by the [place] FDA and  has been authorized for detection and/or diagnosis of SARS-CoV-2 by  FDA under an Emergency  Use Authorization (EUA). This EUA will remain  in effect (meaning this test can be used) for the duration of the  Covid-19 declaration under Section 564(b)(1) of the Act, 21  U.S.C. section 360bbb-3(b)(1), unless the authorization is  terminated or revoked. Performed at [facility], [place] [address] [place], [place] [address]   Urine rapid drug screen (hosp performed)     Status: None   Collection Time: 08/14/19 11:13 AM  Result Value Ref Range   Opiates NONE DETECTED NONE DETECTED   Cocaine NONE DETECTED NONE DETECTED   Benzodiazepines NONE DETECTED NONE DETECTED   Amphetamines NONE DETECTED NONE DETECTED   Tetrahydrocannabinol NONE DETECTED NONE DETECTED   Barbiturates NONE DETECTED NONE DETECTED    Comment: (NOTE) DRUG SCREEN FOR MEDICAL PURPOSES ONLY.  IF CONFIRMATION IS NEEDED FOR ANY PURPOSE, NOTIFY LAB WITHIN 5 DAYS. LOWEST DETECTABLE LIMITS FOR URINE DRUG SCREEN Drug Class                     Cutoff (ng/mL) Amphetamine and metabolites    1000 Barbiturate and metabolites    200 Benzodiazepine                 [id number] Tricyclics and metabolites     300 Opiates and metabolites        300 Cocaine and metabolites        300 THC                            50 Performed at [facility], [place] [address] [place], [place] [address]   Comprehensive metabolic panel     Status: Abnormal   Collection Time: 08/14/19 12:00 PM  Result Value Ref Range   Sodium 141 135 - 145 mmol/L   Potassium 4.0 3.5 - 5.1 mmol/L   Chloride 110 98 - 111 mmol/L   CO2 24 22 - 32 mmol/L   Glucose, Bld 82 70 - 99 mg/dL   BUN 10 6 - 20 mg/dL   Creatinine, Ser 0.48 0.44 - 1.00 mg/dL   Calcium 9.0 8.9 - 10.3 mg/dL   Total Protein 7.1 6.5 - 8.1 g/dL   Albumin 3.9 3.5 - 5.0 g/dL   AST 14 (L) 15 - 41 U/L   ALT 10 0 - 44 U/L   Alkaline Phosphatase 87 38 - 126 U/L   Total Bilirubin 1.0 0.3 - 1.2 mg/dL   GFR calc non Af Amer >60 >60 mL/min   GFR calc Af Amer >60 >60 mL/min   Anion gap 7 5 - 15    Comment: Performed at [facility], [place] [address], [place], [place] [address]  Ethanol     Status: None   Collection Time: 08/14/19 12:00 PM  Result Value Ref Range   Alcohol, Ethyl (B) <10 <10 mg/dL    Comment: (NOTE) Lowest detectable limit for serum alcohol is 10 mg/dL. For medical purposes only. Performed at [facility], [place] [address], [place], [place] [address]   CBC with Diff     Status: Abnormal   Collection Time: 08/14/19 12:00 PM  Result Value Ref Range   WBC 13.6 (H) 4.0 - 10.5 K/uL   RBC 5.41 (H) 3.87 - 5.11 MIL/uL   Hemoglobin 16.3 (H) 12.0 - 15.0 g/dL   HCT 50.1 (H) 36.0 - 46.0 %   MCV 92.6 80.0 - 100.0 fL   MCH 30.1 26.0 -  34.0 pg   MCHC 32.5 30.0 - 36.0 g/dL   RDW 13.3 11.5 - 15.5 %   Platelets 320 150 - 400 K/uL   nRBC 0.0 0.0 - 0.2 %   Neutrophils Relative % 73 %   Neutro Abs 9.9 (H) 1.7 - 7.7 K/uL   Lymphocytes Relative 20 %   Lymphs Abs 2.7 0.7 - 4.0 K/uL   Monocytes Relative 6 %   Monocytes Absolute 0.8 0.1 - 1.0 K/uL   Eosinophils Relative 1 %   Eosinophils Absolute 0.1 0.0 - 0.5 K/uL   Basophils Relative 0 %   Basophils Absolute 0.1 0.0 - 0.1 K/uL   Immature Granulocytes 0 %   Abs Immature Granulocytes 0.05 0.00 - 0.07 K/uL    Comment:  Performed at [facility], [place] [address], [place], [place] [address]    Current Facility-Administered Medications  Medication Dose Route Frequency Provider Last Rate Last Admin  . labetalol (NORMODYNE) tablet 200 mg  200 mg Oral TID Tedd Sias, PA   200 mg at 08/14/19 2157  . levETIRAcetam (KEPPRA) tablet 750 mg  750 mg Oral BID Pati Gallo S, [place]   750 mg at 08/14/19 2157   Current Outpatient Medications  Medication Sig Dispense Refill  . levETIRAcetam (KEPPRA) 750 MG tablet Take 1 tablet (750 mg total) by mouth 2 (two) times daily. 60 tablet 2  . busPIRone (BUSPAR) 15 MG tablet Take 1 tablet (15 mg total) by mouth 3 (three) times daily. (Patient not taking: Reported on 08/14/2019) 90 tablet 1  . EPINEPHrine 0.3 mg/0.3 mL IJ SOAJ injection Inject 0.3 mLs (0.3 mg total) into the muscle as needed for anaphylaxis. (Patient not taking: Reported on 08/14/2019) 1 each 0  . famotidine (PEPCID) 20 MG tablet Take 1 tablet (20 mg total) by mouth 2 (two) times daily. (Patient not taking: Reported on 08/14/2019) 30 tablet 0  . FLUoxetine (PROZAC) 20 MG capsule Take 1 capsule (20 mg total) by mouth daily. (Patient not taking: Reported on 08/14/2019) 90 capsule 1  . hydrOXYzine (ATARAX/VISTARIL) 25 MG tablet Take 1 tablet (25 mg total) by mouth every 6 (six) hours as needed. (Patient not taking: Reported on 08/14/2019) 30 tablet 0  . hydrOXYzine (ATARAX/VISTARIL) 25 MG tablet Take 1 tablet (25 mg total) by mouth every 6 (six) hours as needed for itching. (Patient not taking: Reported on 08/14/2019) 12 tablet 0  . labetalol (NORMODYNE) 200 MG tablet Take 1 tablet (200 mg total) by mouth 3 (three) times daily. (Patient not taking: Reported on 08/14/2019) 90 tablet 3  . omeprazole (PRILOSEC) 20 MG capsule Take 1 capsule (20 mg total) by mouth daily. (Patient not taking: Reported on 08/14/2019) 15 capsule 0  . ondansetron (ZOFRAN ODT) 4 MG disintegrating tablet Take 1 tablet (4 mg  total) by mouth every 8 (eight) hours as needed for nausea or vomiting. (Patient not taking: Reported on 08/14/2019) 5 tablet 0  . prazosin (MINIPRESS) 2 MG capsule Take 2 capsules (4 mg total) by mouth at bedtime. (Patient not taking: Reported on 08/14/2019) 30 capsule 1  . predniSONE (DELTASONE) 20 MG tablet Take 2 tablets (40 mg total) by mouth daily. (Patient not taking: Reported on 08/14/2019) 10 tablet 0  . temazepam (RESTORIL) 30 MG capsule Take 1 capsule (30 mg total) by mouth at bedtime. (Patient not taking: Reported on 08/14/2019) 30 capsule 0    Musculoskeletal: Strength & Muscle Tone: within normal limits Gait & Station: normal  Psychiatric Specialty Exam: Physical  Exam  Review of Systems  Blood pressure 109/63, pulse 61, temperature 98.5 F (36.9 C), temperature source Oral, resp. rate 16, SpO2 98 %, not currently breastfeeding.There is no height or weight on file to calculate BMI.  General Appearance: Casual  Eye Contact:  Good  Speech:  Clear and Coherent  Volume:  Normal  Mood:  Euthymic  Affect:  Appropriate  Thought Process:  Coherent, Goal Directed and Linear  Orientation:  Full (Time, Place, and Person)  Thought Content:  Denies hallucinations or thoughts of self-harm complaining of difficulty with recalling "my boyfriend's name" and things of a biographical nature  Suicidal Thoughts:  No  Homicidal Thoughts:  No  Memory:  Immediate;   Fair Recent;   Fair Remote;   Poor  Judgement:  Fair  Insight:  Fair  Psychomotor Activity:  Normal  Concentration:  Concentration: Fair and Attention Span: Fair  Recall:  [employer] of Knowledge:  Fair  Language:  Fair  Akathisia:  Negative  Handed:  Right  AIMS (if indicated):     Assets:  Others:  Does have housing at this point in time  ADL's:  Intact  Cognition:  WNL  Sleep:        Treatment Plan Summary: As discussed I believe that the patient's current complaints, specifically this unusual level of amnesia,  following a pseudoseizure, or once again propelled by her borderline personality disorder rather than Axis I or neurological pathology and she is denying suicidal thoughts plans or intent has no acute dangerousness and is therefore clear for release home.  Disposition: Follow-up with current clinicians  Johnn Hai, MD 08/15/2019 9:47 AM

## 2019-08-17 LAB — LEVETIRACETAM LEVEL: Levetiracetam Lvl: 20 ug/mL (ref 10.0–40.0)

## 2019-09-28 ENCOUNTER — Ambulatory Visit (INDEPENDENT_AMBULATORY_CARE_PROVIDER_SITE_OTHER): Payer: Medicare (Managed Care) | Admitting: Family Medicine

## 2019-09-28 ENCOUNTER — Other Ambulatory Visit: Payer: Self-pay

## 2019-09-28 ENCOUNTER — Encounter: Payer: Self-pay | Admitting: Family Medicine

## 2019-09-28 VITALS — BP 133/91 | HR 91 | Temp 97.1°F | Ht 68.0 in | Wt 205.2 lb

## 2019-09-28 DIAGNOSIS — G40909 Epilepsy, unspecified, not intractable, without status epilepticus: Secondary | ICD-10-CM | POA: Diagnosis not present

## 2019-09-28 DIAGNOSIS — R4189 Other symptoms and signs involving cognitive functions and awareness: Secondary | ICD-10-CM | POA: Diagnosis not present

## 2019-09-28 DIAGNOSIS — R569 Unspecified convulsions: Secondary | ICD-10-CM

## 2019-09-28 NOTE — Patient Instructions (Signed)
Continue levetiracetam 750mg  twice daily  I recommend psychiatry for concerns of memory loss.   Follow up in 1 year  Seizure, Adult A seizure is a sudden burst of abnormal electrical activity in the brain. Seizures usually last from 30 seconds to 2 minutes. They can cause many different symptoms. Usually, seizures are not harmful unless they last a long time. What are the causes? Common causes of this condition include:  Fever or infection.  Conditions that affect the brain, such as: ? A brain abnormality that you were born with. ? A brain or head injury. ? Bleeding in the brain. ? A tumor. ? Stroke. ? Brain disorders such as autism or cerebral palsy.  Low blood sugar.  Conditions that are passed from parent to child (are inherited).  Problems with substances, such as: ? Having a reaction to a drug or a medicine. ? Suddenly stopping the use of a substance (withdrawal). In some cases, the cause may not be known. A person who has repeated seizures over time without a clear cause has a condition called epilepsy. What increases the risk? You are more likely to get this condition if you have:  A family history of epilepsy.  Had a seizure in the past.  A brain disorder.  A history of head injury, lack of oxygen at birth, or strokes. What are the signs or symptoms? There are many types of seizures. The symptoms vary depending on the type of seizure you have. Examples of symptoms during a seizure include:  Shaking (convulsions).  Stiffness in the body.  Passing out (losing consciousness).  Head nodding.  Staring.  Not responding to sound or touch.  Loss of bladder control and bowel control. Some people have symptoms right before and right after a seizure happens. Symptoms before a seizure may include:  Fear.  Worry (anxiety).  Feeling like you may vomit (nauseous).  Feeling like the room is spinning (vertigo).  Feeling like you saw or heard something before  (dj vu).  Odd tastes or smells.  Changes in how you see. You may see flashing lights or spots. Symptoms after a seizure happens can include:  Confusion.  Sleepiness.  Headache.  Weakness on one side of the body. How is this treated? Most seizures will stop on their own in under 5 minutes. In these cases, no treatment is needed. Seizures that last longer than 5 minutes will usually need treatment. Treatment can include:  Medicines given through an IV tube.  Avoiding things that are known to cause your seizures. These can include medicines that you take for another condition.  Medicines to treat epilepsy.  Surgery to stop the seizures. This may be needed if medicines do not help. Follow these instructions at home: Medicines  Take over-the-counter and prescription medicines only as told by your doctor.  Do not eat or drink anything that may keep your medicine from working, such as alcohol. Activity  Do not do any activities that would be dangerous if you had another seizure, like driving or swimming. Wait until your doctor says it is safe for you to do them.  If you live in the [place] ask your local DMV (department of motor vehicles) when you can drive.  Get plenty of rest. Teaching others Teach friends and family what to do when you have a seizure. They should:  Lay you on the ground.  Protect your head and body.  Loosen any tight clothing around your neck.  Turn you on your side.  Not hold you down.  Not put anything into your mouth.  Know whether or not you need emergency care.  Stay with you until you are better.  General instructions  Contact your doctor each time you have a seizure.  Avoid anything that gives you seizures.  Keep a seizure diary. Write down: ? What you think caused each seizure. ? What you remember about each seizure.  Keep all follow-up visits as told by your doctor. This is important. Contact a doctor if:  You have another  seizure.  You have seizures more often.  There is any change in what happens during your seizures.  You keep having seizures with treatment.  You have symptoms of being sick or having an infection. Get help right away if:  You have a seizure that: ? Lasts longer than 5 minutes. ? Is different than seizures you had before. ? Makes it harder to breathe. ? Happens after you hurt your head.  You have any of these symptoms after a seizure: ? Not being able to speak. ? Not being able to use a part of your body. ? Confusion. ? A bad headache.  You have two or more seizures in a row.  You do not wake up right after a seizure.  You get hurt during a seizure. These symptoms may be an emergency. Do not wait to see if the symptoms will go away. Get medical help right away. Call your local emergency services (911 in the [place] Do not drive yourself to the hospital. Summary  Seizures usually last from 30 seconds to 2 minutes. Usually, they are not harmful unless they last a long time.  Do not eat or drink anything that may keep your medicine from working, such as alcohol.  Teach friends and family what to do when you have a seizure.  Contact your doctor each time you have a seizure. This information is not intended to replace advice given to you by your health care provider. Make sure you discuss any questions you have with your health care provider. Document Revised: 10/24/2018 Document Reviewed: 10/24/2018 Elsevier Patient Education  [place].
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
## 2019-10-31 ENCOUNTER — Emergency Department (HOSPITAL_COMMUNITY)
Admission: EM | Admit: 2019-10-31 | Discharge: 2019-11-01 | Disposition: A | Discharge status: Other Acute Inpatient Hospital | Payer: Medicare (Managed Care) | Attending: Emergency Medicine | Admitting: Emergency Medicine

## 2019-10-31 ENCOUNTER — Other Ambulatory Visit: Payer: Self-pay

## 2019-10-31 ENCOUNTER — Encounter (HOSPITAL_COMMUNITY): Payer: Self-pay

## 2019-10-31 DIAGNOSIS — F332 Major depressive disorder, recurrent severe without psychotic features: Secondary | ICD-10-CM

## 2019-10-31 DIAGNOSIS — I10 Essential (primary) hypertension: Secondary | ICD-10-CM | POA: Diagnosis not present

## 2019-10-31 DIAGNOSIS — F4312 Post-traumatic stress disorder, chronic: Secondary | ICD-10-CM | POA: Diagnosis not present

## 2019-10-31 DIAGNOSIS — T50902A Poisoning by unspecified drugs, medicaments and biological substances, intentional self-harm, initial encounter: Secondary | ICD-10-CM

## 2019-10-31 DIAGNOSIS — F329 Major depressive disorder, single episode, unspecified: Secondary | ICD-10-CM | POA: Insufficient documentation

## 2019-10-31 DIAGNOSIS — R42 Dizziness and giddiness: Secondary | ICD-10-CM | POA: Insufficient documentation

## 2019-10-31 DIAGNOSIS — Z79899 Other long term (current) drug therapy: Secondary | ICD-10-CM | POA: Insufficient documentation

## 2019-10-31 DIAGNOSIS — F603 Borderline personality disorder: Secondary | ICD-10-CM | POA: Diagnosis not present

## 2019-10-31 DIAGNOSIS — Z20822 Contact with and (suspected) exposure to covid-19: Secondary | ICD-10-CM | POA: Insufficient documentation

## 2019-10-31 DIAGNOSIS — F250 Schizoaffective disorder, bipolar type: Secondary | ICD-10-CM | POA: Diagnosis not present

## 2019-10-31 DIAGNOSIS — M069 Rheumatoid arthritis, unspecified: Secondary | ICD-10-CM | POA: Insufficient documentation

## 2019-10-31 DIAGNOSIS — F251 Schizoaffective disorder, depressive type: Secondary | ICD-10-CM | POA: Diagnosis present

## 2019-10-31 DIAGNOSIS — R110 Nausea: Secondary | ICD-10-CM | POA: Insufficient documentation

## 2019-10-31 DIAGNOSIS — T426X2A Poisoning by other antiepileptic and sedative-hypnotic drugs, intentional self-harm, initial encounter: Secondary | ICD-10-CM | POA: Diagnosis present

## 2019-10-31 LAB — CBC WITH DIFFERENTIAL/PLATELET
Abs Immature Granulocytes: 0.02 10*3/uL (ref 0.00–0.07)
Basophils Absolute: 0.1 10*3/uL (ref 0.0–0.1)
Basophils Relative: 1 %
Eosinophils Absolute: 0.1 10*3/uL (ref 0.0–0.5)
Eosinophils Relative: 1 %
HCT: 46.7 % — ABNORMAL HIGH (ref 36.0–46.0)
Hemoglobin: 15.6 g/dL — ABNORMAL HIGH (ref 12.0–15.0)
Immature Granulocytes: 0 %
Lymphocytes Relative: 25 %
Lymphs Abs: 2.4 10*3/uL (ref 0.7–4.0)
MCH: 31.4 pg (ref 26.0–34.0)
MCHC: 33.4 g/dL (ref 30.0–36.0)
MCV: 94 fL (ref 80.0–100.0)
Monocytes Absolute: 0.7 10*3/uL (ref 0.1–1.0)
Monocytes Relative: 8 %
Neutro Abs: 6.4 10*3/uL (ref 1.7–7.7)
Neutrophils Relative %: 65 %
Platelets: 305 10*3/uL (ref 150–400)
RBC: 4.97 MIL/uL (ref 3.87–5.11)
RDW: 13.3 % (ref 11.5–15.5)
WBC: 9.7 10*3/uL (ref 4.0–10.5)
nRBC: 0 % (ref 0.0–0.2)

## 2019-10-31 LAB — RAPID URINE DRUG SCREEN, HOSP PERFORMED
Amphetamines: NOT DETECTED
Barbiturates: NOT DETECTED
Benzodiazepines: NOT DETECTED
Cocaine: NOT DETECTED
Opiates: NOT DETECTED
Tetrahydrocannabinol: NOT DETECTED

## 2019-10-31 LAB — COMPREHENSIVE METABOLIC PANEL
ALT: 12 U/L (ref 0–44)
AST: 16 U/L (ref 15–41)
Albumin: 4.3 g/dL (ref 3.5–5.0)
Alkaline Phosphatase: 79 U/L (ref 38–126)
Anion gap: 7 (ref 5–15)
BUN: 12 mg/dL (ref 6–20)
CO2: 26 mmol/L (ref 22–32)
Calcium: 9.2 mg/dL (ref 8.9–10.3)
Chloride: 106 mmol/L (ref 98–111)
Creatinine, Ser: 0.67 mg/dL (ref 0.44–1.00)
GFR calc Af Amer: >60 mL/min (ref >60)
GFR calc non Af Amer: >60 mL/min (ref >60)
Glucose, Bld: 97 mg/dL (ref 70–99)
Potassium: 4.5 mmol/L (ref 3.5–5.1)
Sodium: 139 mmol/L (ref 135–145)
Total Bilirubin: 0.8 mg/dL (ref 0.3–1.2)
Total Protein: 7.2 g/dL (ref 6.5–8.1)

## 2019-10-31 LAB — ACETAMINOPHEN LEVEL: Acetaminophen (Tylenol), Serum: <10 ug/mL — ABNORMAL LOW (ref 10–30)

## 2019-10-31 LAB — ETHANOL: Alcohol, Ethyl (B): <10 mg/dL (ref <10)

## 2019-10-31 LAB — I-STAT BETA HCG BLOOD, ED (MC, WL, AP ONLY): I-stat hCG, quantitative: <5 m[IU]/mL (ref <5)

## 2019-10-31 LAB — SALICYLATE LEVEL: Salicylate Lvl: <7 mg/dL — ABNORMAL LOW (ref 7.0–30.0)

## 2019-10-31 LAB — RESPIRATORY PANEL BY RT PCR (FLU A&B, COVID)
Influenza A by PCR: NEGATIVE
Influenza B by PCR: NEGATIVE
SARS Coronavirus 2 by RT PCR: NEGATIVE

## 2019-10-31 MED ORDER — ONDANSETRON HCL 4 MG/2ML IJ SOLN
4.0000 mg | Freq: Once | INTRAMUSCULAR | Status: AC
  Administered 2019-10-31: 4 mg via INTRAVENOUS
  Filled 2019-10-31: qty 2

## 2019-10-31 NOTE — ED Notes (Signed)
Patient ambulated to bathroom independently.

## 2019-10-31 NOTE — ED Notes (Signed)
Beth from [employer] called at [facility] and told Chrys Racer, [place] what to expect from [place] overdose:   - EKG - tylenol blood level  - 6 hours monitoring or until asymptomatic  - nausea, vomiting, dizziness
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
## 2019-10-31 NOTE — Progress Notes (Signed)
Patient meets criteria for inpatient treatment per Ricky Ala, NP. No appropriate beds at [facility] currently. CSW faxed referrals to the following facilities for review:  [facility]   [facility]   [facility]   [facility]     TTS will continue to seek bed placement.     Darletta Moll MSW, [place] Worker Disposition  [facility] Ph: [phone] Fax: [phone] 10/31/2019 4:20 PM

## 2019-10-31 NOTE — ED Notes (Signed)
Patient given meal tray at this time.

## 2019-10-31 NOTE — ED Triage Notes (Signed)
SUICIDE ATTEMPT - overdose on Keppra  Poison control called by EMS - 3,600 mg Keppra (24 pills) about an hour ago - poison control stated that she will just feel drowsy and nauseated - 0745 was when they were called by EMS   Patient c/o dizziness and generalized stomach pain   137/76 64 HR 131 CBG 16 RR 99% RA 97.3 temp

## 2019-10-31 NOTE — BH Assessment (Signed)
[place] Assessment Progress Note Per Bobby Rumpf NP patient meets inpatient criteria as appropriate placement is investigated.

## 2019-10-31 NOTE — ED Notes (Signed)
Patient given [place] sandwich, applesauce, cheese stick, and ginger ale at this time. NOT diet ginger ale. Patient states she is highly allergic to artificial sweeteners.

## 2019-10-31 NOTE — ED Provider Notes (Addendum)
[facility] DEPT Provider Note   CSN: [id number] Arrival date & time: 10/31/19  [id number]     History No chief complaint on file.   Amanda [name] is a 39 y.o. female.  Patient is a 39 year old female who presents after overdose.  She states that she took 24 of her Keppra tablets which are 750 mg tablets.  She reports that she took this about 7 AM.  She states that she is felt like everyone's been getting on her case and she could not take it anymore.  She did take in in a suicide attempt.  She currently complains of dizziness and some nausea.  She denies other complaints.  No recent illnesses.  She denies any other ingestions.        Past Medical History:  Diagnosis Date  . Anxiety   . Asthma   . Asthma due to environmental allergies   . Asthma due to seasonal allergies   . Bipolar 1 disorder ([place])   . Borderline personality disorder ([place])   . Brain bleed ([place])   . Chronic post-traumatic stress disorder (PTSD)    complex chronic with psychotic features and self harm behaviors  . Complication of anesthesia   . Constipation   . Dander (animal) allergy   . Hearing loss    right ear  . Heart murmur    denies seeing a cardiologist  . Hip dysplasia   . Hypertension    Gestional   . Major depression, chronic   . Mood disorder ([place])   . OCD (obsessive compulsive disorder)   . Pneumonia   . PONV (postoperative nausea and vomiting)   . PTSD (post-traumatic stress disorder)   . RA (rheumatoid arthritis) ([place])   . Rheumatoid arthritis ([place])   . Schizophrenia ([place])    "I think that is wrong"  . Seizure disorder ([place])   . Suicidal ideations   . Suicide attempt [facility])     Patient Active Problem List   Diagnosis Date Noted  . Major depressive disorder, recurrent episode, severe ([place]) 11/13/2018  . Bipolar 1 disorder, depressed ([place]) 11/10/2018  . Seizures ([place]) 09/23/2018  . Disruption of episiotomy wound in the puerperium 09/04/2018  . Postpartum  care following vaginal delivery 07/28/2018  . Encounter for IUD insertion 07/24/2018  . Gestational hypertension 06/18/2018  . Pap smear of cervix shows high risk HPV present 01/14/2018  . Supervision of high risk pregnancy, antepartum 01/08/2018  . Seizure disorder during pregnancy ([place]) 01/08/2018  . Advanced maternal age, primigravida 01/08/2018  . Bipolar 2 disorder ([place]) 01/08/2018  . Borderline personality disorder ([place]) 01/08/2018  . Major depression, chronic 01/08/2018  . PTSD (post-traumatic stress disorder) 01/08/2018  . OCD (obsessive compulsive disorder) 01/08/2018  . Asthma   . Seizure disorder ([place]) 11/06/2017  . Schizoaffective disorder, depressive type ([place]) 08/16/2017    Past Surgical History:  Procedure Laterality Date  . Brain Shunt  1981   "a few hours old"  . EYE SURGERY Bilateral 1985  . INTRAUTERINE DEVICE (IUD) INSERTION N/A 09/16/2018   Procedure: INTRAUTERINE DEVICE (IUD) INSERTION;  Surgeon: Lavonia Drafts, MD;  Location: [facility];  Service: Gynecology;  Laterality: N/A;  . PERINEUM REPAIR N/A 09/16/2018   Procedure: EPISIOTOMY REVISION;  Surgeon: Lavonia Drafts, MD;  Location: [facility];  Service: Gynecology;  Laterality: N/A;  . [place]  . Tooth Removal     multiple     OB History    Gravida  2   Para  1   Term  1   Preterm  0   AB  1   Living  1     SAB  1   TAB  0   Ectopic  0   Multiple  0   Live Births  1           Family History  Problem Relation Age of Onset  . Heart attack Mother   . Stroke Mother   . Liver cancer Mother   . Diabetes Mother   . Lung cancer Mother   . Alcoholism Father   . Sleep apnea Brother   . Depression Brother   . ADD / ADHD Brother   . Diabetes Maternal Aunt   . Diabetes Paternal Uncle   . Colon cancer Paternal Uncle   . Diabetes Maternal Grandmother   . Dementia Maternal Grandmother     Social History   Tobacco Use  . Smoking status: Current Every Day  Smoker    Packs/day: 0.50    Years: 26.00    Pack years: 13.00    Types: Cigarettes  . Smokeless tobacco: Never Used  Substance Use Topics  . Alcohol use: Not Currently  . Drug use: Not Currently    Types: Marijuana    Comment:  last used in Jan 2019 due to "RA" per pt    Home Medications Prior to Admission medications   Medication Sig Start Date End Date Taking? Authorizing Provider  levETIRAcetam (KEPPRA) 750 MG tablet Take 1 tablet (750 mg total) by mouth 2 (two) times daily. 08/09/19  Yes Charlesetta Shanks, MD  busPIRone (BUSPAR) 15 MG tablet Take 1 tablet (15 mg total) by mouth 3 (three) times daily. Patient not taking: Reported on 08/14/2019 11/13/18   Johnn Hai, MD  EPINEPHrine 0.3 mg/0.3 mL IJ SOAJ injection Inject 0.3 mLs (0.3 mg total) into the muscle as needed for anaphylaxis. Patient not taking: Reported on 08/14/2019 04/01/19   Horton, Barbette Hair, MD  famotidine (PEPCID) 20 MG tablet Take 1 tablet (20 mg total) by mouth 2 (two) times daily. Patient not taking: Reported on 08/14/2019 04/01/19   Horton, Barbette Hair, MD  FLUoxetine (PROZAC) 20 MG capsule Take 1 capsule (20 mg total) by mouth daily. Patient not taking: Reported on 08/14/2019 11/14/18   Johnn Hai, MD  hydrOXYzine (ATARAX/VISTARIL) 25 MG tablet Take 1 tablet (25 mg total) by mouth every 6 (six) hours as needed. Patient not taking: Reported on 08/14/2019 02/06/19   Chase Picket, MD  hydrOXYzine (ATARAX/VISTARIL) 25 MG tablet Take 1 tablet (25 mg total) by mouth every 6 (six) hours as needed for itching. Patient not taking: Reported on 08/14/2019 04/01/19   Horton, Barbette Hair, MD  labetalol (NORMODYNE) 200 MG tablet Take 1 tablet (200 mg total) by mouth 3 (three) times daily. Patient not taking: Reported on 08/14/2019 07/02/18   Elvera Maria, CNM  omeprazole (PRILOSEC) 20 MG capsule Take 1 capsule (20 mg total) by mouth daily. Patient not taking: Reported on 08/14/2019 03/05/19   Ok Edwards, PA-C    ondansetron (ZOFRAN ODT) 4 MG disintegrating tablet Take 1 tablet (4 mg total) by mouth every 8 (eight) hours as needed for nausea or vomiting. Patient not taking: Reported on 08/14/2019 03/05/19   Ok Edwards, PA-C  prazosin (MINIPRESS) 2 MG capsule Take 2 capsules (4 mg total) by mouth at bedtime. Patient not taking: Reported on 08/14/2019 11/13/18   Johnn Hai, MD  predniSONE (DELTASONE) 20 MG tablet Take 2  tablets (40 mg total) by mouth daily. Patient not taking: Reported on 08/14/2019 04/01/19   Horton, Barbette Hair, MD  temazepam (RESTORIL) 30 MG capsule Take 1 capsule (30 mg total) by mouth at bedtime. Patient not taking: Reported on 08/14/2019 11/13/18   Johnn Hai, MD    Allergies    Aspartame and phenylalanine, Benadryl [diphenhydramine], Other, Scallops [shellfish allergy], Yellow jacket venom [bee venom], Pollen extract, Tegretol [carbamazepine], Adhesive [tape], and Latex  Review of Systems   Review of Systems  Constitutional: Negative for chills, diaphoresis, fatigue and fever.  HENT: Negative for congestion, rhinorrhea and sneezing.   Eyes: Negative.   Respiratory: Negative for cough, chest tightness and shortness of breath.   Cardiovascular: Negative for chest pain and leg swelling.  Gastrointestinal: Positive for nausea. Negative for abdominal pain, blood in stool, diarrhea and vomiting.  Genitourinary: Negative for difficulty urinating, flank pain, frequency and hematuria.  Musculoskeletal: Negative for arthralgias and back pain.  Skin: Negative for rash.  Neurological: Positive for dizziness and light-headedness. Negative for speech difficulty, weakness, numbness and headaches.    Physical Exam Updated Vital Signs BP 102/69   Pulse (!) 57   Temp 98 F (36.7 C) (Oral)   Resp 15   Ht 5\' 8"  (1.727 m)   Wt 93 kg   SpO2 96%   BMI 31.17 kg/m   Physical Exam Constitutional:      Appearance: She is well-developed.  HENT:     Head: Normocephalic and atraumatic.   Eyes:     Pupils: Pupils are equal, round, and reactive to light.  Cardiovascular:     Rate and Rhythm: Normal rate and regular rhythm.     Heart sounds: Normal heart sounds.  Pulmonary:     Effort: Pulmonary effort is normal. No respiratory distress.     Breath sounds: Normal breath sounds. No wheezing or rales.  Chest:     Chest wall: No tenderness.  Abdominal:     General: Bowel sounds are normal.     Palpations: Abdomen is soft.     Tenderness: There is no abdominal tenderness. There is no guarding or rebound.  Musculoskeletal:        General: Normal range of motion.     Cervical back: Normal range of motion and neck supple.  Lymphadenopathy:     Cervical: No cervical adenopathy.  Skin:    General: Skin is warm and dry.     Findings: No rash.  Neurological:     Mental Status: She is alert and oriented to person, place, and time.  Psychiatric:        Mood and Affect: Mood is anxious and depressed.     ED Results / Procedures / Treatments   Labs (all labs ordered are listed, but only abnormal results are displayed) Labs Reviewed  CBC WITH DIFFERENTIAL/PLATELET - Abnormal; Notable for the following components:      Result Value   Hemoglobin 15.6 (*)    HCT 46.7 (*)    All other components within normal limits  SALICYLATE LEVEL - Abnormal; Notable for the following components:   Salicylate Lvl [id number] (*)    All other components within normal limits  ACETAMINOPHEN LEVEL - Abnormal; Notable for the following components:   Acetaminophen (Tylenol), Serum <10 (*)    All other components within normal limits  RESPIRATORY PANEL BY RT PCR (FLU A&B, COVID)  COMPREHENSIVE METABOLIC PANEL  ETHANOL  RAPID URINE DRUG SCREEN, HOSP PERFORMED  I-STAT BETA HCG BLOOD, ED ([facility],  [facility], [facility] ONLY)    EKG EKG Interpretation  Date/Time:  Saturday October 31 2019 08:19:26 EST Ventricular Rate:  60 PR Interval:    QRS Duration: 91 QT Interval:  419 QTC Calculation: 419 R Axis:   70 Text  Interpretation: Sinus rhythm Probable left atrial enlargement RSR' in V1 or V2, probably normal variant since last tracing no significant change Confirmed by Malvin Johns [phone]) on 10/31/2019 8:50:07 AM   Radiology No results found.  Procedures Procedures (including critical care time)  Medications Ordered in ED Medications  ondansetron (ZOFRAN) injection 4 mg (4 mg Intravenous Given 10/31/19 0900)    ED Course  I have reviewed the triage vital signs and the nursing notes.  Pertinent labs & imaging results that were available during my care of the patient were reviewed by me and considered in my medical decision making (see chart for details).    MDM Rules/Calculators/A&P                      8:32 I spoke to poison control who said that CNS depression is the most likely effect of the Keppra ingestion.  They recommended EKG, labs and 6-hour monitoring.  14:00 patient currently is having some mild dizziness and drowsiness.  She is awake and alert.  She has nonconcerning labs.  Her EKG is nonconcerning.  She is medically cleared.  TTS has evaluated her and decided to keep her overnight for observation.  The patient has been placed in psychiatric observation due to the need to provide a safe environment for the patient while obtaining psychiatric consultation and evaluation, as well as ongoing medical and medication management to treat the patient's condition.  The patient has not been placed under full IVC at this time.  Final Clinical Impression(s) / ED Diagnoses Final diagnoses:  Intentional drug overdose, initial encounter [facility])    Rx / DC Orders ED Discharge Orders    None       Malvin Johns, MD 10/31/19 1347    Malvin Johns, MD 10/31/19 1349

## 2019-10-31 NOTE — ED Notes (Signed)
Pt states that she is here in the hospital because she tried to kill herself.  Pt states that she is still suicidal but has no plan to harm herself.  Pt states that she hasn't been able to hold her 73 month old baby since COVID precautions were put in place. That and stumbling across the list of family members that had passed away caused pt to feel suicidal and attempt to kill herself today.

## 2019-11-01 ENCOUNTER — Inpatient Hospital Stay (HOSPITAL_COMMUNITY)
Admission: AD | Admit: 2019-11-01 | Discharge: 2019-11-04 | DRG: 885 | Disposition: A | Discharge status: Home / Self Care | Payer: Medicare (Managed Care) | Source: Intra-hospital | Attending: Psychiatry | Admitting: Psychiatry

## 2019-11-01 DIAGNOSIS — F603 Borderline personality disorder: Secondary | ICD-10-CM | POA: Diagnosis present

## 2019-11-01 DIAGNOSIS — Z91030 Bee allergy status: Secondary | ICD-10-CM

## 2019-11-01 DIAGNOSIS — F251 Schizoaffective disorder, depressive type: Secondary | ICD-10-CM

## 2019-11-01 DIAGNOSIS — Z833 Family history of diabetes mellitus: Secondary | ICD-10-CM | POA: Diagnosis not present

## 2019-11-01 DIAGNOSIS — F332 Major depressive disorder, recurrent severe without psychotic features: Principal | ICD-10-CM | POA: Diagnosis present

## 2019-11-01 DIAGNOSIS — F17210 Nicotine dependence, cigarettes, uncomplicated: Secondary | ICD-10-CM | POA: Diagnosis present

## 2019-11-01 DIAGNOSIS — F429 Obsessive-compulsive disorder, unspecified: Secondary | ICD-10-CM | POA: Diagnosis present

## 2019-11-01 DIAGNOSIS — Z9109 Other allergy status, other than to drugs and biological substances: Secondary | ICD-10-CM

## 2019-11-01 DIAGNOSIS — Z888 Allergy status to other drugs, medicaments and biological substances status: Secondary | ICD-10-CM

## 2019-11-01 DIAGNOSIS — F4312 Post-traumatic stress disorder, chronic: Secondary | ICD-10-CM | POA: Diagnosis present

## 2019-11-01 DIAGNOSIS — Y900 Blood alcohol level of less than 20 mg/100 ml: Secondary | ICD-10-CM | POA: Diagnosis present

## 2019-11-01 DIAGNOSIS — Z818 Family history of other mental and behavioral disorders: Secondary | ICD-10-CM

## 2019-11-01 DIAGNOSIS — K5900 Constipation, unspecified: Secondary | ICD-10-CM | POA: Diagnosis present

## 2019-11-01 DIAGNOSIS — I10 Essential (primary) hypertension: Secondary | ICD-10-CM | POA: Diagnosis present

## 2019-11-01 DIAGNOSIS — Z91048 Other nonmedicinal substance allergy status: Secondary | ICD-10-CM

## 2019-11-01 DIAGNOSIS — Z8249 Family history of ischemic heart disease and other diseases of the circulatory system: Secondary | ICD-10-CM

## 2019-11-01 DIAGNOSIS — G40909 Epilepsy, unspecified, not intractable, without status epilepticus: Secondary | ICD-10-CM | POA: Diagnosis present

## 2019-11-01 DIAGNOSIS — H9191 Unspecified hearing loss, right ear: Secondary | ICD-10-CM | POA: Diagnosis present

## 2019-11-01 DIAGNOSIS — Z20822 Contact with and (suspected) exposure to covid-19: Secondary | ICD-10-CM | POA: Diagnosis present

## 2019-11-01 DIAGNOSIS — J45909 Unspecified asthma, uncomplicated: Secondary | ICD-10-CM | POA: Diagnosis present

## 2019-11-01 DIAGNOSIS — F1010 Alcohol abuse, uncomplicated: Secondary | ICD-10-CM | POA: Diagnosis present

## 2019-11-01 DIAGNOSIS — Z801 Family history of malignant neoplasm of trachea, bronchus and lung: Secondary | ICD-10-CM

## 2019-11-01 DIAGNOSIS — M069 Rheumatoid arthritis, unspecified: Secondary | ICD-10-CM | POA: Diagnosis present

## 2019-11-01 DIAGNOSIS — Z23 Encounter for immunization: Secondary | ICD-10-CM | POA: Diagnosis present

## 2019-11-01 DIAGNOSIS — Z590 Homelessness: Secondary | ICD-10-CM

## 2019-11-01 DIAGNOSIS — T426X2A Poisoning by other antiepileptic and sedative-hypnotic drugs, intentional self-harm, initial encounter: Secondary | ICD-10-CM | POA: Diagnosis present

## 2019-11-01 DIAGNOSIS — Z800 Family history of malignant neoplasm of digestive organs: Secondary | ICD-10-CM

## 2019-11-01 DIAGNOSIS — Z91040 Latex allergy status: Secondary | ICD-10-CM

## 2019-11-01 DIAGNOSIS — Z823 Family history of stroke: Secondary | ICD-10-CM

## 2019-11-01 DIAGNOSIS — F250 Schizoaffective disorder, bipolar type: Secondary | ICD-10-CM | POA: Diagnosis present

## 2019-11-01 DIAGNOSIS — G4700 Insomnia, unspecified: Secondary | ICD-10-CM | POA: Diagnosis present

## 2019-11-01 DIAGNOSIS — Z79899 Other long term (current) drug therapy: Secondary | ICD-10-CM

## 2019-11-01 DIAGNOSIS — Z811 Family history of alcohol abuse and dependence: Secondary | ICD-10-CM

## 2019-11-01 MED ORDER — FLUOXETINE HCL 20 MG PO CAPS
20.0000 mg | ORAL_CAPSULE | Freq: Every day | ORAL | Status: DC
  Administered 2019-11-01: 11:00:00 20 mg via ORAL
  Filled 2019-11-01: qty 1

## 2019-11-01 MED ORDER — FLUOXETINE HCL 20 MG PO CAPS
20.0000 mg | ORAL_CAPSULE | Freq: Every day | ORAL | Status: DC
  Administered 2019-11-02 – 2019-11-04 (×3): 20 mg via ORAL
  Filled 2019-11-01 (×5): qty 1

## 2019-11-01 MED ORDER — GABAPENTIN 300 MG PO CAPS
300.0000 mg | ORAL_CAPSULE | Freq: Two times a day (BID) | ORAL | Status: DC
  Administered 2019-11-02 – 2019-11-04 (×5): 300 mg via ORAL
  Filled 2019-11-01 (×11): qty 1

## 2019-11-01 MED ORDER — GABAPENTIN 300 MG PO CAPS
300.0000 mg | ORAL_CAPSULE | Freq: Two times a day (BID) | ORAL | Status: DC
  Administered 2019-11-01: 300 mg via ORAL
  Filled 2019-11-01: qty 1

## 2019-11-01 MED ORDER — TRAZODONE HCL 50 MG PO TABS
50.0000 mg | ORAL_TABLET | Freq: Every evening | ORAL | Status: DC | PRN
  Administered 2019-11-01 – 2019-11-03 (×3): 50 mg via ORAL
  Filled 2019-11-01 (×3): qty 1

## 2019-11-01 MED ORDER — HYDROXYZINE HCL 25 MG PO TABS
25.0000 mg | ORAL_TABLET | Freq: Three times a day (TID) | ORAL | Status: DC | PRN
  Administered 2019-11-01 – 2019-11-03 (×4): 25 mg via ORAL
  Filled 2019-11-01 (×4): qty 1

## 2019-11-01 NOTE — ED Notes (Signed)
Meal tray provided.

## 2019-11-01 NOTE — ED Notes (Signed)
Report called to receiving nurse at [facility], Nurse Kathlee Nations

## 2019-11-01 NOTE — ED Notes (Signed)
Assisted pt with ambulating to the restroom. Pt now comfortable and eating lunch tray that was provided

## 2019-11-01 NOTE — Progress Notes (Signed)
Per Ethelda Chick, pt has been accepted to [facility]. Bed number is 301-1. Accepting provider is  Merlyn Lot, NP. Attending provider will be Dr. Mallie Darting. Number for report is [phone]. The pt may arrive at or after 7:30pm. CSW spoke w/ Chrys Racer, RN to provide disposition information.   Darletta Moll MSW, [place] Worker Disposition  [facility] Ph: [phone] Fax: [phone]  11/01/2019 3:03 PM

## 2019-11-01 NOTE — ED Notes (Signed)
Sitter at pt bedside. 

## 2019-11-01 NOTE — ED Notes (Signed)
Safe transport called for pt transfer to [facility]

## 2019-11-01 NOTE — ED Notes (Signed)
[place], [facility] called Chrys Racer, RN to say that patient has been accepted at [facility]. Patient to go over there after 1930 this evening.

## 2019-11-01 NOTE — ED Notes (Addendum)
Pt. In burgundy scrubs. Pt. wanded by security before going to the facility at [facility]. Pt. Belonging locked up in cabinet 23-25. 1pr gray shoes, 1 ring, 1 necklace and 1 rubber bracelet, 1pr. Pink socks, 1 red jacket, 1 gray pant, 1 blue/black shirt, 1 green tank top and 1 blue t-shirt, 1 white tube top and 1 gray underpant.

## 2019-11-01 NOTE — Consult Note (Addendum)
Chart reviewed and patient seen for face to face assessment with Dr. Darleene Cleaver.  Patient presents with low energy and continues to endorse hopelessness, worthlessness, suicidal ideations with intent.    Per EDP note, poison control was notified at patient arrival, recommended patient monitoring for  CNS depression s/p Keppra ingestion, EKG and, labs and 6-hour monitoring.  Patient with hx for seizure disorder who attempted to overdose on Keppra, therefore, Keppra discontinued and patient started on Gabapentin as seizure prophylaxis.  She continues to meet inpatient criteria.  Pending medical clearance, recommend inpatient admission for mood stabilization.  She has been accepted to [facility] inpatient pending discharges for later today.  Patient voluntarily accepts admission.   The following medications were started: Fluoxetine 20mg  po daily for depression Gabapentin 300mg  po BID for seizure disorder  Dr. Tamera Punt, [place], notified of above.   Patient seen face-to-face for psychiatric evaluation, chart reviewed and case discussed with the physician extender and developed treatment plan. Reviewed the information documented and agree with the treatment plan. Corena Pilgrim, MD

## 2019-11-01 NOTE — ED Notes (Signed)
Meal tray provided at bedside

## 2019-11-02 ENCOUNTER — Encounter (HOSPITAL_COMMUNITY): Payer: Self-pay | Admitting: Adult Health

## 2019-11-02 ENCOUNTER — Other Ambulatory Visit: Payer: Self-pay

## 2019-11-02 DIAGNOSIS — F332 Major depressive disorder, recurrent severe without psychotic features: Principal | ICD-10-CM

## 2019-11-02 MED ORDER — BUSPIRONE HCL 10 MG PO TABS
10.0000 mg | ORAL_TABLET | Freq: Three times a day (TID) | ORAL | Status: DC
  Administered 2019-11-02 – 2019-11-04 (×6): 10 mg via ORAL
  Filled 2019-11-02 (×9): qty 1
  Filled 2019-11-02: qty 2
  Filled 2019-11-02 (×3): qty 1

## 2019-11-02 MED ORDER — PNEUMOCOCCAL VAC POLYVALENT 25 MCG/0.5ML IJ INJ
0.5000 mL | INJECTION | INTRAMUSCULAR | Status: AC
  Administered 2019-11-03: 0.5 mL via INTRAMUSCULAR

## 2019-11-02 MED ORDER — INFLUENZA VAC SPLIT QUAD 0.5 ML IM SUSY
0.5000 mL | PREFILLED_SYRINGE | INTRAMUSCULAR | Status: AC
  Administered 2019-11-03: 0.5 mL via INTRAMUSCULAR
  Filled 2019-11-02: qty 0.5

## 2019-11-02 MED ORDER — NICOTINE POLACRILEX 2 MG MT GUM
2.0000 mg | CHEWING_GUM | OROMUCOSAL | Status: DC | PRN
  Administered 2019-11-02 – 2019-11-04 (×3): 2 mg via ORAL

## 2019-11-02 NOTE — Progress Notes (Addendum)
ADULT GRIEF GROUP NOTE:  Spiritual care group on grief and loss facilitated by chaplain Jerene Pitch  Group Goal:  Support / Education around grief and loss Members engage in facilitated group support and psycho-social education.  Group Description:  Following introductions and group rules, group members engaged in facilitated group dialog and support around topic of loss, with particular support around experiences of loss in their lives. Group Identified types of loss (relationships / self / things) and identified patterns, circumstances, and changes that precipitate losses. Reflected on thoughts / feelings around loss, normalized grief responses, and recognized variety in grief experience. Patient Progress:  Amanda [name] arrived at group late.  Noted that they feels grief around the death of twin brother - who died shortly after their premature birth.  Amanda [name] focused on the fact that  twin brother "reached for my hand before he died"   Fixated on "what might have been / what he would be like" as well as "no one knows what it is like to lose a twin."  Was able to describe rituals which help orient to how to take next step in life - - particularly, making a small cake for brother during their birthday and taking walk in woods to speak with brother.  Throughout group, Amanda [name] would return to fixation on "unanswerable questions."   Group noticed this tendency and spoke about how unanswerable questions can keep them spinning in unknowns.  Another group member offered suggestions for re-orienting and setting down these questions for a short time out of self-care.   Notably, Amanda [name] did not acknowledge other elements present in other provider's notes - son in foster care, current homelessness.

## 2019-11-02 NOTE — Plan of Care (Signed)
Nurse discussed using cane instead of wheelchair today.  Pt called to evaluate/treat this patient.

## 2019-11-02 NOTE — Tx Team (Signed)
Initial Treatment Plan 11/02/2019 12:28 AM [name] [id number]    PATIENT STRESSORS: Financial difficulties Loss of CPS removed child Marital or family conflict Medication change or noncompliance   PATIENT STRENGTHS: Active sense of humor Average or above average intelligence Communication skills   PATIENT IDENTIFIED PROBLEMS: Depression  Suicidal Ideaton  Substance abuse       "Get back on my meds"           DISCHARGE CRITERIA:  Adequate post-discharge living arrangements Improved stabilization in mood, thinking, and/or behavior Motivation to continue treatment in a less acute level of care Need for constant or close observation no longer present Verbal commitment to aftercare and medication compliance  PRELIMINARY DISCHARGE PLAN: Outpatient therapy Placement in alternative living arrangements  PATIENT/FAMILY INVOLVEMENT: This treatment plan has been presented to and reviewed with the patient, Amanda [name].  The patient and family have been given the opportunity to ask questions and make suggestions.  Margaretann Loveless, RN 11/02/2019, 12:28 AM

## 2019-11-02 NOTE — Tx Team (Addendum)
Interdisciplinary Treatment and Diagnostic Plan Update  11/02/2019 Time of Session: 9:00AM Amanda [name] MRN: [id number]  Principal Diagnosis: <principal problem not specified>  Secondary Diagnoses: Active Problems:   MDD (major depressive disorder), recurrent severe, without psychosis ([place])   Current Medications:  Current Facility-Administered Medications  Medication Dose Route Frequency Provider Last Rate Last Admin  . FLUoxetine (PROZAC) capsule 20 mg  20 mg Oral Daily Merlyn Lot E, NP   20 mg at 11/02/19 0800  . gabapentin (NEURONTIN) capsule 300 mg  300 mg Oral BID Merlyn Lot E, NP   300 mg at 11/02/19 0800  . hydrOXYzine (ATARAX/VISTARIL) tablet 25 mg  25 mg Oral TID PRN Mallie Darting, NP   25 mg at 11/02/19 0801  . [START ON 11/03/2019] influenza vac split quadrivalent PF (FLUARIX) injection 0.5 mL  0.5 mL Intramuscular Tomorrow-1000 Sharma Covert, MD      . Derrill Memo ON 11/03/2019] pneumococcal 23 valent vaccine (PNEUMOVAX-23) injection 0.5 mL  0.5 mL Intramuscular Tomorrow-1000 Sharma Covert, MD      . traZODone (DESYREL) tablet 50 mg  50 mg Oral QHS PRN,MR X 1 Lindon Romp A, NP   50 mg at 11/01/19 2305   PTA Medications: Medications Prior to Admission  Medication Sig Dispense Refill Last Dose  . busPIRone (BUSPAR) 15 MG tablet Take 1 tablet (15 mg total) by mouth 3 (three) times daily. (Patient not taking: Reported on 08/14/2019) 90 tablet 1   . EPINEPHrine 0.3 mg/0.3 mL IJ SOAJ injection Inject 0.3 mLs (0.3 mg total) into the muscle as needed for anaphylaxis. (Patient not taking: Reported on 08/14/2019) 1 each 0   . famotidine (PEPCID) 20 MG tablet Take 1 tablet (20 mg total) by mouth 2 (two) times daily. (Patient not taking: Reported on 08/14/2019) 30 tablet 0   . FLUoxetine (PROZAC) 20 MG capsule Take 1 capsule (20 mg total) by mouth daily. (Patient not taking: Reported on 08/14/2019) 90 capsule 1   . hydrOXYzine (ATARAX/VISTARIL) 25 MG tablet Take 1 tablet  (25 mg total) by mouth every 6 (six) hours as needed. (Patient not taking: Reported on 08/14/2019) 30 tablet 0   . hydrOXYzine (ATARAX/VISTARIL) 25 MG tablet Take 1 tablet (25 mg total) by mouth every 6 (six) hours as needed for itching. (Patient not taking: Reported on 08/14/2019) 12 tablet 0   . labetalol (NORMODYNE) 200 MG tablet Take 1 tablet (200 mg total) by mouth 3 (three) times daily. (Patient not taking: Reported on 08/14/2019) 90 tablet 3   . levETIRAcetam (KEPPRA) 750 MG tablet Take 1 tablet (750 mg total) by mouth 2 (two) times daily. 60 tablet 2   . omeprazole (PRILOSEC) 20 MG capsule Take 1 capsule (20 mg total) by mouth daily. (Patient not taking: Reported on 08/14/2019) 15 capsule 0   . ondansetron (ZOFRAN ODT) 4 MG disintegrating tablet Take 1 tablet (4 mg total) by mouth every 8 (eight) hours as needed for nausea or vomiting. (Patient not taking: Reported on 08/14/2019) 5 tablet 0   . prazosin (MINIPRESS) 2 MG capsule Take 2 capsules (4 mg total) by mouth at bedtime. (Patient not taking: Reported on 08/14/2019) 30 capsule 1   . predniSONE (DELTASONE) 20 MG tablet Take 2 tablets (40 mg total) by mouth daily. (Patient not taking: Reported on 08/14/2019) 10 tablet 0   . temazepam (RESTORIL) 30 MG capsule Take 1 capsule (30 mg total) by mouth at bedtime. (Patient not taking: Reported on 08/14/2019) 30 capsule 0  Patient Stressors: Financial difficulties Loss of CPS removed child Marital or family conflict Medication change or noncompliance  Patient Strengths: Active sense of humor Average or above average intelligence Communication skills  Treatment Modalities: Medication Management, Group therapy, Case management,  1 to 1 session with clinician, Psychoeducation, Recreational therapy.   Physician Treatment Plan for Primary Diagnosis: <principal problem not specified> Long Term Goal(s):     Short Term Goals:    Medication Management: Evaluate patient's response, side  effects, and tolerance of medication regimen.  Therapeutic Interventions: 1 to 1 sessions, Unit Group sessions and Medication administration.  Evaluation of Outcomes: Not Met  Physician Treatment Plan for Secondary Diagnosis: Active Problems:   MDD (major depressive disorder), recurrent severe, without psychosis ([place])  Long Term Goal(s):     Short Term Goals:       Medication Management: Evaluate patient's response, side effects, and tolerance of medication regimen.  Therapeutic Interventions: 1 to 1 sessions, Unit Group sessions and Medication administration.  Evaluation of Outcomes: Not Met   RN Treatment Plan for Primary Diagnosis: <principal problem not specified> Long Term Goal(s): Knowledge of disease and therapeutic regimen to maintain health will improve  Short Term Goals: Ability to demonstrate self-control, Ability to participate in decision making will improve, Ability to verbalize feelings will improve, Ability to disclose and discuss suicidal ideas, Ability to identify and develop effective coping behaviors will improve and Compliance with prescribed medications will improve  Medication Management: RN will administer medications as ordered by provider, will assess and evaluate patient's response and provide education to patient for prescribed medication. RN will report any adverse and/or side effects to prescribing provider.  Therapeutic Interventions: 1 on 1 counseling sessions, Psychoeducation, Medication administration, Evaluate responses to treatment, Monitor vital signs and CBGs as ordered, Perform/monitor CIWA, COWS, AIMS and Fall Risk screenings as ordered, Perform wound care treatments as ordered.  Evaluation of Outcomes: Not Met   LCSW Treatment Plan for Primary Diagnosis: <principal problem not specified> Long Term Goal(s): Safe transition to appropriate next level of care at discharge, Engage patient in therapeutic group addressing interpersonal  concerns.  Short Term Goals: Engage patient in aftercare planning with referrals and resources, Increase social support, Increase ability to appropriately verbalize feelings, Increase emotional regulation, Facilitate acceptance of mental health diagnosis and concerns and Increase skills for wellness and recovery  Therapeutic Interventions: Assess for all discharge needs, 1 to 1 time with Social worker, Explore available resources and support systems, Assess for adequacy in community support network, Educate family and significant other(s) on suicide prevention, Complete Psychosocial Assessment, Interpersonal group therapy.  Evaluation of Outcomes: Not Met   Progress in Treatment: Attending groups: No. Participating in groups: No. Taking medication as prescribed: Yes. Toleration medication: Yes. Family/Significant other contact made: No, will contact:  once permission is given. Patient understands diagnosis: Yes. Discussing patient identified problems/goals with staff: Yes. Medical problems stabilized or resolved: Yes. Denies suicidal/homicidal ideation: Yes. Issues/concerns per patient self-inventory: No. Other: none  New problem(s) identified: No, Describe:  none  New Short Term/Long Term Goal(s): medication management for mood stabilization; elimination of SI thoughts; development of comprehensive mental wellness plan.  Patient Goals:  "get back on my medications and a phone number for a therapist at discharge"  Discharge Plan or Barriers: No barriers identified at this time. Patient reports a desire to begin outpatient therapy again.   Reason for Continuation of Hospitalization: Anxiety Depression Medical Issues Suicidal ideation  Estimated Length of Stay:  1-7 days  Attendees: Patient:  Amanda [name] 11/02/2019 9:42 AM  Physician: Dr. Mallie Darting, MD 11/02/2019 9:42 AM  Nursing:  11/02/2019 9:42 AM  RN Care Manager: 11/02/2019 9:42 AM  Social Worker: Assunta Curtis, LCSW  11/02/2019 9:42 AM  Recreational Therapist:  11/02/2019 9:42 AM  Other:  11/02/2019 9:42 AM  Other:  11/02/2019 9:42 AM  Other: 11/02/2019 9:42 AM    Scribe for Treatment Team: Rozann Lesches, LCSW 11/02/2019 9:42 AM

## 2019-11-02 NOTE — Progress Notes (Signed)
The patient shared with group that she spent her day making her peers laugh. She states that she is feeling better now that she is back on her medication. Her goal for tomorrow is to make her peers laugh.

## 2019-11-02 NOTE — Progress Notes (Addendum)
Nurse called [facility] for [facility].  Orvil Feil in PT phone [phone] will find staff member to help this patient.  Nurse is waiting for call from PT.  Patient has been using cane instead of wheelchair today.
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
## 2019-11-02 NOTE — H&P (Signed)
Psychiatric Admission Assessment Adult  Patient Identification: Amanda [name] MRN:  [id number] Date of Evaluation:  11/02/2019 Chief Complaint:  MDD (major depressive disorder), recurrent severe, without psychosis ([place]) [F33.2] Principal Diagnosis: <principal problem not specified> Diagnosis:  Active Problems:   MDD (major depressive disorder), recurrent severe, without psychosis ([place])  History of Present Illness: Patient is seen and examined.  Patient is a 39 year old female with a past psychiatric history significant for schizoaffective disorder; bipolar type who presented to the [facility] emergency department on 10/31/2019.  The patient stated that she had ingested 24 Keppra tablets earlier on that date to end her life.  She stated she had been under a great deal of stress recently.  She had been "couch surfing", and had other stressors in her life.  Her godfather who she was rather close to had died last year, and was still having grief issues over that.  She also stated her child had been in child protective services since last year, and that was because of her continued psychiatric issues.  She has had multiple psychiatric admissions and multiple attempts at self-harm.  She was last seen at [facility] emergency department for suicidal ideation on 11/10/2018.  She had not been on her medications for at least a year.  She had been receiving [facility] [place].  She was monitored at [facility] given the overdose, and was cleared medically.  She was placed on gabapentin prophylactically for seizure prevention.  She admitted to helplessness, hopelessness and worthlessness.  She stated that she had pain issues as well.  She is admitted to the hospital for evaluation and stabilization.  Associated Signs/Symptoms: Depression Symptoms:  depressed mood, anhedonia, insomnia, psychomotor agitation, fatigue, feelings of worthlessness/guilt, difficulty  concentrating, hopelessness, suicidal thoughts without plan, anxiety, loss of energy/fatigue, disturbed sleep, weight loss, (Hypo) Manic Symptoms:  Impulsivity, Irritable Mood, Labiality of Mood, Anxiety Symptoms:  Excessive Worry, Psychotic Symptoms:  Denied PTSD Symptoms: Negative Total Time spent with patient: 30 minutes  Past Psychiatric History: Patient has apparently been admitted almost 20 different occasions in the past.  Unfortunately she is still homeless, and apparently child protective services took away her child secondary to the squalid living conditions at the hotel she was staying back in March 2020.  She has been previously diagnosed with borderline personality disorder, PTSD, OCD, bipolar type II, schizoaffective disorder as well as chronic cutting behaviors.  Is the patient at risk to self? Yes.    Has the patient been a risk to self in the past 6 months? Yes.    Has the patient been a risk to self within the distant past? Yes.    Is the patient a risk to others? No.  Has the patient been a risk to others in the past 6 months? No.  Has the patient been a risk to others within the distant past? No.   Prior Inpatient Therapy:   Prior Outpatient Therapy:    Alcohol Screening: 1. How often do you have a drink containing alcohol?: Monthly or less 2. How many drinks containing alcohol do you have on a typical day when you are drinking?: 3 or 4 3. How often do you have six or more drinks on one occasion?: Less than monthly AUDIT-C Score: 3 4. How often during the last year have you found that you were not able to stop drinking once you had started?: Never 5. How often during the last year have you failed to  do what was normally expected from you becasue of drinking?: Less than monthly 6. How often during the last year have you needed a first drink in the morning to get yourself going after a heavy drinking session?: Never 7. How often during the last year have you had a  feeling of guilt of remorse after drinking?: Less than monthly 8. How often during the last year have you been unable to remember what happened the night before because you had been drinking?: Never 9. Have you or someone else been injured as a result of your drinking?: No 10. Has a relative or friend or a doctor or another health worker been concerned about your drinking or suggested you cut down?: No Alcohol Use Disorder Identification Test Final Score (AUDIT): 5 Alcohol Brief Interventions/Follow-up: AUDIT Score <7 follow-up not indicated Substance Abuse History in the last 12 months:  No. Consequences of Substance Abuse: Negative Previous Psychotropic Medications: Yes  Psychological Evaluations: Yes  Past Medical History:  Past Medical History:  Diagnosis Date  . Anxiety   . Asthma   . Asthma due to environmental allergies   . Asthma due to seasonal allergies   . Bipolar 1 disorder ([place])   . Borderline personality disorder ([place])   . Brain bleed ([place])   . Chronic post-traumatic stress disorder (PTSD)    complex chronic with psychotic features and self harm behaviors  . Complication of anesthesia   . Constipation   . Dander (animal) allergy   . Hearing loss    right ear  . Heart murmur    denies seeing a cardiologist  . Hip dysplasia   . Hypertension    Gestional   . Major depression, chronic   . Mood disorder ([place])   . OCD (obsessive compulsive disorder)   . Pneumonia   . PONV (postoperative nausea and vomiting)   . PTSD (post-traumatic stress disorder)   . RA (rheumatoid arthritis) ([place])   . Rheumatoid arthritis ([place])   . Schizophrenia ([place])    "I think that is wrong"  . Seizure disorder ([place])   . Suicidal ideations   . Suicide attempt [facility])     Past Surgical History:  Procedure Laterality Date  . Brain Shunt  1981   "a few hours old"  . EYE SURGERY Bilateral 1985  . INTRAUTERINE DEVICE (IUD) INSERTION N/A 09/16/2018   Procedure: INTRAUTERINE DEVICE (IUD)  INSERTION;  Surgeon: Lavonia Drafts, MD;  Location: [facility];  Service: Gynecology;  Laterality: N/A;  . PERINEUM REPAIR N/A 09/16/2018   Procedure: EPISIOTOMY REVISION;  Surgeon: Lavonia Drafts, MD;  Location: [facility];  Service: Gynecology;  Laterality: N/A;  . [place]  . Tooth Removal     multiple   Family History:  Family History  Problem Relation Age of Onset  . Heart attack Mother   . Stroke Mother   . Liver cancer Mother   . Diabetes Mother   . Lung cancer Mother   . Alcoholism Father   . Sleep apnea Brother   . Depression Brother   . ADD / ADHD Brother   . Diabetes Maternal Aunt   . Diabetes Paternal Uncle   . Colon cancer Paternal Uncle   . Diabetes Maternal Grandmother   . Dementia Maternal Grandmother    Family Psychiatric  History: Father with alcoholism, brother with depression, brother with attentional problems and a maternal grandmother with dementia. Tobacco Screening: Have you used any form of tobacco in the last 30 days? (Cigarettes,  Smokeless Tobacco, Cigars, and/or Pipes): Yes Tobacco use, Select all that apply: 5 or more cigarettes per day Are you interested in Tobacco Cessation Medications?: Yes, will notify MD for an order Counseled patient on smoking cessation including recognizing danger situations, developing coping skills and basic information about quitting provided: Refused/Declined practical counseling Social History:  Social History   Substance and Sexual Activity  Alcohol Use Not Currently     Social History   Substance and Sexual Activity  Drug Use Not Currently  . Types: Marijuana   Comment:  last used in Jan 2019 due to "RA" per pt    Additional Social History:                           Allergies:   Allergies  Allergen Reactions  . Aspartame And Phenylalanine Anaphylaxis, Hives and Diarrhea  . Benadryl [Diphenhydramine] Anaphylaxis, Diarrhea and Other (See Comments)    Blisters  . Other  Anaphylaxis, Nausea And Vomiting, Rash and Other (See Comments)    Aspartame- Blisters Dust- Worsens asthma Ragweed- Worsens asthma, face gets red, and sneezing Animal Fur/Dander- Worsens asthma and sneezing    . Scallops [Shellfish Allergy] Anaphylaxis, Diarrhea and Nausea And Vomiting  . Yellow Jacket Venom [Bee Venom] Anaphylaxis, Diarrhea and Nausea And Vomiting    Seizures and numbness  . Pollen Extract Other (See Comments)    Runny nose, eyes, and asthma worsens  . Tegretol [Carbamazepine] Hives, Diarrhea and Other (See Comments)    Blisters in mouth and increase in seizures  . Adhesive [Tape] Rash  . Latex Hives and Rash    Blisters, also- Condoms and dental encounters   Lab Results:  Results for orders placed or performed during the hospital encounter of 10/31/19 (from the past 48 hour(s))  Respiratory Panel by RT PCR (Flu A&B, Covid) - Nasopharyngeal Swab     Status: None   Collection Time: 10/31/19  1:44 PM   Specimen: Nasopharyngeal Swab  Result Value Ref Range   SARS Coronavirus 2 by RT PCR NEGATIVE NEGATIVE    Comment: (NOTE) SARS-CoV-2 target nucleic acids are NOT DETECTED. The SARS-CoV-2 RNA is generally detectable in upper respiratoy specimens during the acute phase of infection. The lowest concentration of SARS-CoV-2 viral copies this assay can detect is 131 copies/mL. A negative result does not preclude SARS-Cov-2 infection and should not be used as the sole basis for treatment or other patient management decisions. A negative result may occur with  improper specimen collection/handling, submission of specimen other than nasopharyngeal swab, presence of viral mutation(s) within the areas targeted by this assay, and inadequate number of viral copies (<131 copies/mL). A negative result must be combined with clinical observations, patient history, and epidemiological information. The expected result is Negative. Fact Sheet for Patients:   PinkCheek.be Fact Sheet for Healthcare Providers:  GravelBags.it This test is not yet ap proved or cleared by the [place] FDA and  has been authorized for detection and/or diagnosis of SARS-CoV-2 by FDA under an Emergency Use Authorization (EUA). This EUA will remain  in effect (meaning this test can be used) for the duration of the COVID-19 declaration under Section 564(b)(1) of the Act, 21 U.S.C. section 360bbb-3(b)(1), unless the authorization is terminated or revoked sooner.    Influenza A by PCR NEGATIVE NEGATIVE   Influenza B by PCR NEGATIVE NEGATIVE    Comment: (NOTE) The Xpert Xpress SARS-CoV-2/FLU/RSV assay is intended as an aid in  the diagnosis of  influenza from Nasopharyngeal swab specimens and  should not be used as a sole basis for treatment. Nasal washings and  aspirates are unacceptable for Xpert Xpress SARS-CoV-2/FLU/RSV  testing. Fact Sheet for Patients: PinkCheek.be Fact Sheet for Healthcare Providers: GravelBags.it This test is not yet approved or cleared by the [place] FDA and  has been authorized for detection and/or diagnosis of SARS-CoV-2 by  FDA under an Emergency Use Authorization (EUA). This EUA will remain  in effect (meaning this test can be used) for the duration of the  Covid-19 declaration under Section 564(b)(1) of the Act, 21  U.S.C. section 360bbb-3(b)(1), unless the authorization is  terminated or revoked. Performed at [facility] [place] [address], [place],  [address]     Blood Alcohol level:  Lab Results  Component Value Date   [facility] <10 10/31/2019   ETH <10 [id number]    Metabolic Disorder Labs:  Lab Results  Component Value Date   HGBA1C 5.3 11/11/2018   MPG 105.41 11/11/2018   Lab Results  Component Value Date   PROLACTIN 36.9 (H) 11/11/2018   No results found for: CHOL, TRIG,  HDL, CHOLHDL, VLDL, LDLCALC  Current Medications: Current Facility-Administered Medications  Medication Dose Route Frequency Provider Last Rate Last Admin  . busPIRone (BUSPAR) tablet 10 mg  10 mg Oral TID Sharma Covert, MD   10 mg at 11/02/19 1130  . FLUoxetine (PROZAC) capsule 20 mg  20 mg Oral Daily Merlyn Lot E, NP   20 mg at 11/02/19 0800  . gabapentin (NEURONTIN) capsule 300 mg  300 mg Oral BID Merlyn Lot E, NP   300 mg at 11/02/19 0800  . hydrOXYzine (ATARAX/VISTARIL) tablet 25 mg  25 mg Oral TID PRN Mallie Darting, NP   25 mg at 11/02/19 0801  . [START ON 11/03/2019] influenza vac split quadrivalent PF (FLUARIX) injection 0.5 mL  0.5 mL Intramuscular Tomorrow-1000 Mallie Darting, Cordie Grice, MD      . nicotine polacrilex (NICORETTE) gum 2 mg  2 mg Oral PRN Sharma Covert, MD      . Derrill Memo ON 11/03/2019] pneumococcal 23 valent vaccine (PNEUMOVAX-23) injection 0.5 mL  0.5 mL Intramuscular Tomorrow-1000 Sharma Covert, MD      . traZODone (DESYREL) tablet 50 mg  50 mg Oral QHS PRN,MR X 1 Lindon Romp A, NP   50 mg at 11/01/19 2305   PTA Medications: Medications Prior to Admission  Medication Sig Dispense Refill Last Dose  . busPIRone (BUSPAR) 15 MG tablet Take 1 tablet (15 mg total) by mouth 3 (three) times daily. (Patient not taking: Reported on 08/14/2019) 90 tablet 1   . EPINEPHrine 0.3 mg/0.3 mL IJ SOAJ injection Inject 0.3 mLs (0.3 mg total) into the muscle as needed for anaphylaxis. (Patient not taking: Reported on 08/14/2019) 1 each 0   . famotidine (PEPCID) 20 MG tablet Take 1 tablet (20 mg total) by mouth 2 (two) times daily. (Patient not taking: Reported on 08/14/2019) 30 tablet 0   . FLUoxetine (PROZAC) 20 MG capsule Take 1 capsule (20 mg total) by mouth daily. (Patient not taking: Reported on 08/14/2019) 90 capsule 1   . hydrOXYzine (ATARAX/VISTARIL) 25 MG tablet Take 1 tablet (25 mg total) by mouth every 6 (six) hours as needed. (Patient not taking: Reported on  08/14/2019) 30 tablet 0   . hydrOXYzine (ATARAX/VISTARIL) 25 MG tablet Take 1 tablet (25 mg total) by mouth every 6 (six) hours as needed for itching. (Patient not taking: Reported  on 08/14/2019) 12 tablet 0   . labetalol (NORMODYNE) 200 MG tablet Take 1 tablet (200 mg total) by mouth 3 (three) times daily. (Patient not taking: Reported on 08/14/2019) 90 tablet 3   . levETIRAcetam (KEPPRA) 750 MG tablet Take 1 tablet (750 mg total) by mouth 2 (two) times daily. 60 tablet 2   . omeprazole (PRILOSEC) 20 MG capsule Take 1 capsule (20 mg total) by mouth daily. (Patient not taking: Reported on 08/14/2019) 15 capsule 0   . ondansetron (ZOFRAN ODT) 4 MG disintegrating tablet Take 1 tablet (4 mg total) by mouth every 8 (eight) hours as needed for nausea or vomiting. (Patient not taking: Reported on 08/14/2019) 5 tablet 0   . prazosin (MINIPRESS) 2 MG capsule Take 2 capsules (4 mg total) by mouth at bedtime. (Patient not taking: Reported on 08/14/2019) 30 capsule 1   . predniSONE (DELTASONE) 20 MG tablet Take 2 tablets (40 mg total) by mouth daily. (Patient not taking: Reported on 08/14/2019) 10 tablet 0   . temazepam (RESTORIL) 30 MG capsule Take 1 capsule (30 mg total) by mouth at bedtime. (Patient not taking: Reported on 08/14/2019) 30 capsule 0     Musculoskeletal: Strength & Muscle Tone: decreased Gait & Station: broad based Patient leans: N/A  Psychiatric Specialty Exam: Physical Exam  Nursing note and vitals reviewed. Constitutional: He is oriented to person, place, and time. He appears well-developed and well-nourished.  HENT:  Head: Normocephalic and atraumatic.  Respiratory: Effort normal.  Neurological: He is alert and oriented to person, place, and time.    Review of Systems  Blood pressure 117/65, pulse (!) 57, temperature 97.9 F (36.6 C), temperature source Oral, resp. rate 18, height 5\' 6"  (1.676 m), weight 89.8 kg, SpO2 97 %, not currently breastfeeding.Body mass index is 31.96  kg/m.  General Appearance: Disheveled  Eye Contact:  Fair  Speech:  Normal Rate  Volume:  Normal  Mood:  Anxious  Affect:  Congruent  Thought Process:  Coherent and Descriptions of Associations: Intact  Orientation:  Full (Time, Place, and Person)  Thought Content:  Logical  Suicidal Thoughts:  Yes.  without intent/plan  Homicidal Thoughts:  No  Memory:  Immediate;   Fair Recent;   Fair Remote;   Fair  Judgement:  Impaired  Insight:  Lacking  Psychomotor Activity:  Normal  Concentration:  Concentration: Fair and Attention Span: Fair  Recall:  [employer] of Knowledge:  Fair  Language:  Good  Akathisia:  Negative  Handed:  Right  AIMS (if indicated):     Assets:  Desire for Improvement Resilience  ADL's:  Intact  Cognition:  WNL  Sleep:  Number of Hours: 4.75    Treatment Plan Summary: Daily contact with patient to assess and evaluate symptoms and progress in treatment, Medication management and Plan : Patient is seen and examined.  Patient is a 39 year old female with a past psychiatric history significant for bipolar disorder, borderline personality disorder and depression who was admitted after an intentional overdose of Keppra.  She will be admitted to the hospital.  She will be integrated into the milieu.  She will be encouraged to attend groups.  She will be placed back on BuSpar, fluoxetine and prazosin.  She also has been previously treated with labetalol.  Her blood pressure right now is stable, but we will make sure and confirm her dosage.  Also of her laboratories showed normal electrolytes, a mild elevation of hemoglobin and hematocrit at 15.6 and 46.7.  Platelets were 305,000.  Differential was normal.  Salicylate and acetaminophen were both negative.  Blood alcohol was less than 10.  Drug screen was negative.  Beta-hCG was negative.  EKG showed normal sinus rhythm with a normal QTc interval.  Patient states that she has rheumatoid arthritis, but her hands look  essentially normal.  Apparently she has not seen a rheumatologist in the past.  She sitting in a wheelchair today because of lower extremity pain.  As stated above she does have a seizure disorder.  There are no x-rays of her hip or other joints outside of an ankle x-ray in 2019 that was negative.  The staff is given her a wheelchair at this point, and she says she walks with a cane.  I will have physical therapy see her and try and get her a walker if at all necessary.  She stated that the neurologist told her she has nerve damage in her right lower extremity, but I can find no evidence of that in the note from the neurologist.  Observation Level/Precautions:  15 minute checks Seizure  Laboratory:  Chemistry Profile  Psychotherapy:    Medications:    Consultations:    Discharge Concerns:    Estimated LOS:  Other:     Physician Treatment Plan for Primary Diagnosis: <principal problem not specified> Long Term Goal(s): Improvement in symptoms so as ready for discharge  Short Term Goals: Ability to identify changes in lifestyle to reduce recurrence of condition will improve, Ability to verbalize feelings will improve, Ability to disclose and discuss suicidal ideas, Ability to demonstrate self-control will improve, Ability to identify and develop effective coping behaviors will improve, Ability to maintain clinical measurements within normal limits will improve and Compliance with prescribed medications will improve  Physician Treatment Plan for Secondary Diagnosis: Active Problems:   MDD (major depressive disorder), recurrent severe, without psychosis ([place])  Long Term Goal(s): Improvement in symptoms so as ready for discharge  Short Term Goals: Ability to identify changes in lifestyle to reduce recurrence of condition will improve, Ability to verbalize feelings will improve, Ability to disclose and discuss suicidal ideas, Ability to demonstrate self-control will improve, Ability to identify and  develop effective coping behaviors will improve, Ability to maintain clinical measurements within normal limits will improve and Compliance with prescribed medications will improve  I certify that inpatient services furnished can reasonably be expected to improve the patient's condition.    Sharma Covert, MD 3/15/202112:13 PM

## 2019-11-02 NOTE — BHH Suicide Risk Assessment (Signed)
[facility] INPATIENT:  Family/Significant Other Suicide Prevention Education  Suicide Prevention Education:  Patient Refusal for Family/Significant Other Suicide Prevention Education: The patient Amanda [name] has refused to provide written consent for family/significant other to be provided Family/Significant Other Suicide Prevention Education during admission and/or prior to discharge.  Physician notified.  SPE completed with pt, as pt refused to consent to family contact. SPI pamphlet provided to pt and pt was encouraged to share information with support network, ask questions, and talk about any concerns relating to SPE. Pt denies access to guns/firearms and verbalized understanding of information provided. Mobile Crisis information also provided to pt.   Rozann Lesches 11/02/2019, 2:08 PM

## 2019-11-02 NOTE — Progress Notes (Signed)
D:  Patient denied SI and HI, contracts for safety.  Denied A/V hallucinations.  Denied pain. A:  Medications administered per MD orders.  Emotional support and encouragement given patient. R:  Safety maintained with 15 minute checks.  

## 2019-11-02 NOTE — BHH Group Notes (Signed)
LCSW Group Therapy Note 11/02/2019 1:15 PM  Type of Therapy and Topic: Group Therapy: Overcoming Obstacles  Participation Level: Active  Description of Group:  In this group patients will be encouraged to explore what they see as obstacles to their own wellness and recovery. They will be guided to discuss their thoughts, feelings, and behaviors related to these obstacles. The group will process together ways to cope with barriers, with attention given to specific choices patients can make. Each patient will be challenged to identify changes they are motivated to make in order to overcome their obstacles. This group will be process-oriented, with patients participating in exploration of their own experiences as well as giving and receiving support and challenge from other group members.  Therapeutic Goals: 1. Patient will identify personal and current obstacles as they relate to admission. 2. Patient will identify barriers that currently interfere with their wellness or overcoming obstacles.  3. Patient will identify feelings, thought process and behaviors related to these barriers. 4. Patient will identify two changes they are willing to make to overcome these obstacles:   Summary of Patient Progress  Amanda [name] was engaged and participated throughout the group session. Amanda [name] reports that her main obstacle is her rheumatoid arthritis. Amanda [name] reports she was diagnosis with RA when she was 39 years old and has learned how to live and cope with her medical condition. Amanda [name] reports she plans to remain "positive" and "motivated" in order to move forward and overcome her obstacles.      Therapeutic Modalities:  Cognitive Behavioral Therapy Solution Focused Therapy Motivational Interviewing Relapse Prevention Therapy   Theresa Duty Clinical Social Worker

## 2019-11-02 NOTE — Progress Notes (Signed)
Recreation Therapy Notes  Date:  3.15.21 Time: 0930 Location: [address] Group Room  Group Topic: Stress Management  Goal Area(s) Addresses:  Patient will identify positive stress management techniques. Patient will identify benefits of using stress management post d/c.  Behavioral Response:  Engaged  Intervention: Stress Management  Activity : Meditation.  LRT played a meditation that focused on choice.  Patients were to listen and follow along as meditation played to engage in the meditation.  Education:  Stress Management, Discharge Planning.   Education Outcome: Acknowledges Education  Clinical Observations/Feedback:  Pt attended and participated in activity.     Victorino Sparrow, LRT/CTRS         Victorino Sparrow A 11/02/2019 11:58 AM

## 2019-11-02 NOTE — Progress Notes (Signed)
PT Note  Patient Details Name: Amanda [name] MRN: [id number] DOB: 11-Nov-1980        Received order today, will be able to come and assess pt in the morning, Tuesday. According to chart review looks like pt uses a cane for LE joint pain at times.    Clide Dales 11/02/2019, 6:25 PM

## 2019-11-03 MED ORDER — LEVETIRACETAM 500 MG PO TABS
500.0000 mg | ORAL_TABLET | Freq: Two times a day (BID) | ORAL | Status: DC
  Administered 2019-11-03 – 2019-11-04 (×3): 500 mg via ORAL
  Filled 2019-11-03 (×8): qty 1

## 2019-11-03 NOTE — Evaluation (Signed)
Physical Therapy Evaluation Patient Details Name: Amanda [name] MRN: [id number] DOB: 03-18-1981 Today's Date: 11/03/2019   History of Present Illness  39 YO admitted for SA., H/O shizophrenia, anxiety, Hipm displasia  Clinical Impression  The patient  currenrly up on unit with Rw. Patient did ambulate on levels with SPC. Patient reports that she currently does not have access to assistive device and is requesting a RW as she is concerned about right hip giving away and falling. She reports  She is alone a lot. No further PT needs.  PT RECOMMENDS A ROLLING WALKER AT DC.    Follow Up Recommendations No PT follow up    Equipment Recommendations  Rolling walker with 5" wheels    Recommendations for Other Services       Precautions / Restrictions Precautions Precautions: Fall Precaution Comments: SA      Mobility  Bed Mobility Overal bed mobility: Modified Independent                Transfers Overall transfer level: Modified independent Equipment used: Rolling walker (2 wheeled);Straight cane                Ambulation/Gait Ambulation/Gait assistance: Modified independent (Device/Increase time) Gait Distance (Feet): 200 Feet Assistive device: Rolling walker (2 wheeled);Straight cane Gait Pattern/deviations: Step-through pattern;Antalgic     General Gait Details: gait steady with SPC and currently using RW  Stairs            Wheelchair Mobility    Modified Rankin (Stroke Patients Only)       Balance Overall balance assessment: No apparent balance deficits (not formally assessed)                                           Pertinent Vitals/Pain Pain Assessment: 0-10 Pain Score: 6  Pain Location: right hip Pain Descriptors / Indicators: Aching Pain Intervention(s): Premedicated before session    Home Living Family/patient expects to be discharged to:: Unsure Living Arrangements: Spouse/significant other Available Help at  Discharge: Available PRN/intermittently Type of Home: Homeless           Additional Comments: reports has a RW and cane but  has been disposed of    Prior Function                 Hand Dominance        Extremity/Trunk Assessment   Upper Extremity Assessment Upper Extremity Assessment: Overall WFL for tasks assessed    Lower Extremity Assessment Lower Extremity Assessment: Overall WFL for tasks assessed       Communication      Cognition Arousal/Alertness: Awake/alert Behavior During Therapy: WFL for tasks assessed/performed Overall Cognitive Status: Within Functional Limits for tasks assessed                                        General Comments      Exercises     Assessment/Plan    PT Assessment Patent does not need any further PT services  PT Problem List         PT Treatment Interventions      PT Goals (Current goals can be found in the Care Plan section)  Acute Rehab PT Goals Patient Stated Goal: to get back to volunteering PT Goal Formulation: All assessment  and education complete, DC therapy    Frequency     Barriers to discharge        Co-evaluation               AM-PAC PT "6 Clicks" Mobility  Outcome Measure Help needed turning from your back to your side while in a flat bed without using bedrails?: None Help needed moving from lying on your back to sitting on the side of a flat bed without using bedrails?: None Help needed moving to and from a bed to a chair (including a wheelchair)?: None Help needed standing up from a chair using your arms (e.g., wheelchair or bedside chair)?: None Help needed to walk in hospital room?: None Help needed climbing 3-5 steps with a railing? : None 6 Click Score: 24    End of Session   Activity Tolerance: Patient tolerated treatment well Patient left: in chair Nurse Communication: Mobility status PT Visit Diagnosis: Muscle weakness (generalized) (M62.81);History of  falling (Z91.81)    Time: 1120-1140 PT Time Calculation (min) (ACUTE ONLY): 20 min   Charges:   PT Evaluation $PT Eval Low Complexity: [facility] Acute Rehabilitation Services Pager [phone] Office [phone]   Claretha Cooper 11/03/2019, 1:29 PM

## 2019-11-03 NOTE — Progress Notes (Signed)
Pt denied having SI/HI/AVH.  Pt reported that she was "having a good day" and RN observed pt interacting with other pt's on the unit, and participating in group activities.  Pt using walker on the unit for safety.  Pt started back on Keppra medication this morning. Pt is worrying about not being with her husband and she does not know where they are going to live when pt is discharged from [facility]. RN administered medications as prescribed and validated pt's concerns and feelings.     11/03/19 0815  Psych Admission Type (Psych Patients Only)  Admission Status Voluntary  Psychosocial Assessment  Patient Complaints Anxiety;Depression;Nervousness;Sadness;Worrying  Eye Contact Fair  Facial Expression Animated  Affect Appropriate to circumstance  Speech Logical/coherent  Interaction Assertive  Motor Activity Fidgety  Appearance/Hygiene Unremarkable  Behavior Characteristics Cooperative;Restless;Anxious  Mood Depressed;Anxious  Thought Process  Coherency WDL  Content WDL  Delusions None reported or observed  Perception WDL  Hallucination None reported or observed  Judgment Impaired  Confusion None  Danger to Self  Current suicidal ideation? Denies  Danger to Others  Danger to Others None reported or observed

## 2019-11-03 NOTE — Progress Notes (Signed)
Psychoeducational Group Note  Date:  11/03/2019 Time:  2105  Group Topic/Focus:  Wrap-Up Group:   The focus of this group is to help patients review their daily goal of treatment and discuss progress on daily workbooks.  Participation Level: Did Not Attend  Participation Quality:  Not Applicable  Affect:  Not Applicable  Cognitive:  Not Applicable  Insight:  Not Applicable  Engagement in Group: Not Applicable  Additional Comments:  The patient did not attend group this evening since she was asleep.   Archie Balboa S 11/03/2019, 9:05 PM

## 2019-11-03 NOTE — Progress Notes (Signed)
Adult Psychoeducational Group Note  Date:  11/03/2019 Time:  9:52 AM  Group Topic/Focus:  Goals Group:   The focus of this group is to help patients establish daily goals to achieve during treatment and discuss how the patient can incorporate goal setting into their daily lives to aide in recovery.  Participation Level:  Active  Participation Quality:  Appropriate and Attentive  Affect:  Appropriate and Excited  Cognitive:  Appropriate  Insight: Appropriate  Engagement in Group:  Engaged  Modes of Intervention:  Discussion  Additional Comments:  Pt attended morning goals group.   Amanda [name] 11/03/2019, 9:52 AM

## 2019-11-03 NOTE — Progress Notes (Signed)
   11/03/19 2200  Psych Admission Type (Psych Patients Only)  Admission Status Voluntary  Psychosocial Assessment  Patient Complaints Anxiety;Depression  Eye Contact Fair  Facial Expression Animated  Affect Appropriate to circumstance  Speech Logical/coherent  Interaction Assertive  Motor Activity Fidgety  Appearance/Hygiene Unremarkable  Behavior Characteristics Cooperative;Anxious  Mood Depressed  Thought Process  Coherency WDL  Content WDL  Delusions None reported or observed  Perception WDL  Hallucination None reported or observed  Judgment Impaired  Confusion None  Danger to Self  Current suicidal ideation? Denies  Danger to Others  Danger to Others None reported or observed

## 2019-11-03 NOTE — Progress Notes (Signed)
   11/03/19 0000  Psych Admission Type (Psych Patients Only)  Admission Status Voluntary  Psychosocial Assessment  Patient Complaints Anxiety;Depression  Eye Contact Fair  Facial Expression Animated  Affect Appropriate to circumstance  Speech Logical/coherent  Interaction Assertive  Motor Activity Fidgety  Appearance/Hygiene Disheveled;In scrubs  Behavior Characteristics Anxious  Mood Anxious;Depressed  Thought Process  Coherency WDL  Content WDL  Delusions None reported or observed  Perception WDL  Hallucination None reported or observed  Judgment Impaired  Confusion None  Danger to Self  Current suicidal ideation? Denies  Danger to Others  Danger to Others None reported or observed   Pt stated she felt better she's back on her meds

## 2019-11-03 NOTE — Progress Notes (Signed)
[facility] MD Progress Note  11/03/2019 11:55 AM Amanda [name]  MRN:  [id number] Subjective: Patient reports she is feeling better today.  Currently denies suicidal ideations.  Denies medication side effects. Objective: I have reviewed case with treatment team and have met with patient. 39 year old female, history of schizoaffective disorder .  History of multiple prior psychiatric admissions.  History of seizure disorder.  Presented to ED on 3/13 following a suicide attempt by overdose on Keppra, which she is prescribed.  She reports she has been facing significant stressors/losses.  She is currently homeless, her son is in foster care, she states she has been unable to see him for a prolonged period of time due to Covid epidemic.  States that foster family supposed to "send me photographs but they have not done it".  She also reports an uncle who was very close to her died last year.  Today patient reports she is feeling better.  Currently denies suicidal ideations and contracts for safety on unit. Behavior on unit is in good control, pleasant/cooperative on approach.  She has been visible in dayroom. As above, reports history of seizure disorder which she describes as partial seizures.  States she had been taking Keppra for long period of time prior to her admission, denies side effects.  She has had no seizures or seizure-like episodes on unit. Denies medication side effects.  She is currently on Prozac/BuSpar/Neurontin,.   Principal Problem: Schizoaffective disorder by history, status post overdose Diagnosis: Active Problems:   MDD (major depressive disorder), recurrent severe, without psychosis ([place])  Total Time spent with patient: 15 minutes  Past Psychiatric History:   Past Medical History:  Past Medical History:  Diagnosis Date  . Anxiety   . Asthma   . Asthma due to environmental allergies   . Asthma due to seasonal allergies   . Bipolar 1 disorder ([place])   . Borderline personality  disorder ([place])   . Brain bleed ([place])   . Chronic post-traumatic stress disorder (PTSD)    complex chronic with psychotic features and self harm behaviors  . Complication of anesthesia   . Constipation   . Dander (animal) allergy   . Hearing loss    right ear  . Heart murmur    denies seeing a cardiologist  . Hip dysplasia   . Hypertension    Gestional   . Major depression, chronic   . Mood disorder ([place])   . OCD (obsessive compulsive disorder)   . Pneumonia   . PONV (postoperative nausea and vomiting)   . PTSD (post-traumatic stress disorder)   . RA (rheumatoid arthritis) ([place])   . Rheumatoid arthritis ([place])   . Schizophrenia ([employer])    "I think that is wrong"  . Seizure disorder ([place])   . Suicidal ideations   . Suicide attempt [facility])     Past Surgical History:  Procedure Laterality Date  . Brain Shunt  1981   "a few hours old"  . EYE SURGERY Bilateral 1985  . INTRAUTERINE DEVICE (IUD) INSERTION N/A 09/16/2018   Procedure: INTRAUTERINE DEVICE (IUD) INSERTION;  Surgeon: Lavonia Drafts, MD;  Location: [facility];  Service: Gynecology;  Laterality: N/A;  . PERINEUM REPAIR N/A 09/16/2018   Procedure: EPISIOTOMY REVISION;  Surgeon: Lavonia Drafts, MD;  Location: [facility];  Service: Gynecology;  Laterality: N/A;  . [place]  . Tooth Removal     multiple   Family History:  Family History  Problem Relation Age of Onset  . Heart attack  Mother   . Stroke Mother   . Liver cancer Mother   . Diabetes Mother   . Lung cancer Mother   . Alcoholism Father   . Sleep apnea Brother   . Depression Brother   . ADD / ADHD Brother   . Diabetes Maternal Aunt   . Diabetes Paternal Uncle   . Colon cancer Paternal Uncle   . Diabetes Maternal Grandmother   . Dementia Maternal Grandmother    Family Psychiatric  History: Social History:  Social History   Substance and Sexual Activity  Alcohol Use Not Currently     Social History   Substance and Sexual Activity   Drug Use Not Currently  . Types: Marijuana   Comment:  last used in Jan 2019 due to "RA" per pt    Social History   Socioeconomic History  . Marital status: Legally Separated    Spouse name: Not on file  . Number of children: 1  . Years of education: Not on file  . Highest education level: Not on file  Occupational History  . Not on file  Tobacco Use  . Smoking status: Current Every Day Smoker    Packs/day: 0.50    Years: 26.00    Pack years: 13.00    Types: Cigarettes  . Smokeless tobacco: Never Used  Substance and Sexual Activity  . Alcohol use: Not Currently  . Drug use: Not Currently    Types: Marijuana    Comment:  last used in Jan 2019 due to "RA" per pt  . Sexual activity: Not Currently    Birth control/protection: None  Other Topics Concern  . Not on file  Social History Narrative   ** Merged History Encounter **       Lives with her fiance, friend, and fiance's brother   Right handed   Social Determinants of Health   Financial Resource Strain:   . Difficulty of Paying Living Expenses:   Food Insecurity:   . Worried About Charity fundraiser in the Last Year:   . Arboriculturist in the Last Year:   Transportation Needs:   . Film/video editor (Medical):   Marland Kitchen Lack of Transportation (Non-Medical):   Physical Activity:   . Days of Exercise per Week:   . Minutes of Exercise per Session:   Stress:   . Feeling of Stress :   Social Connections:   . Frequency of Communication with Friends and Family:   . Frequency of Social Gatherings with Friends and Family:   . Attends Religious Services:   . Active Member of Clubs or Organizations:   . Attends Archivist Meetings:   Marland Kitchen Marital Status:    Additional Social History:   Sleep: Improving  Appetite:  Improving  Current Medications: Current Facility-Administered Medications  Medication Dose Route Frequency Provider Last Rate Last Admin  . busPIRone (BUSPAR) tablet 10 mg  10 mg Oral TID  Sharma Covert, MD   10 mg at 11/03/19 0824  . FLUoxetine (PROZAC) capsule 20 mg  20 mg Oral Daily Merlyn Lot E, NP   20 mg at 11/03/19 0825  . gabapentin (NEURONTIN) capsule 300 mg  300 mg Oral BID Merlyn Lot E, NP   300 mg at 11/03/19 8242  . hydrOXYzine (ATARAX/VISTARIL) tablet 25 mg  25 mg Oral TID PRN Mallie Darting, NP   25 mg at 11/02/19 2203  . levETIRAcetam (KEPPRA) tablet 500 mg  500 mg Oral BID , Felicita Gage  A, MD   500 mg at 11/03/19 0828  . nicotine polacrilex (NICORETTE) gum 2 mg  2 mg Oral PRN Sharma Covert, MD   2 mg at 11/03/19 0904  . traZODone (DESYREL) tablet 50 mg  50 mg Oral QHS PRN,MR X 1 Lindon Romp A, NP   50 mg at 11/02/19 2203    Lab Results: No results found for this or any previous visit (from the past 48 hour(s)).  Blood Alcohol level:  Lab Results  Component Value Date   ETH <10 10/31/2019   ETH <10 65/68/1275    Metabolic Disorder Labs: Lab Results  Component Value Date   HGBA1C 5.3 11/11/2018   MPG 105.41 11/11/2018   Lab Results  Component Value Date   PROLACTIN 36.9 (H) 11/11/2018   No results found for: CHOL, TRIG, HDL, CHOLHDL, VLDL, LDLCALC  Physical Findings: AIMS: Facial and Oral Movements Muscles of Facial Expression: None, normal Lips and Perioral Area: None, normal Jaw: None, normal Tongue: None, normal,Extremity Movements Upper (arms, wrists, hands, fingers): None, normal Lower (legs, knees, ankles, toes): None, normal, Trunk Movements Neck, shoulders, hips: None, normal, Overall Severity Severity of abnormal movements (highest score from questions above): None, normal Incapacitation due to abnormal movements: None, normal Patient's awareness of abnormal movements (rate only patient's report): No Awareness, Dental Status Current problems with teeth and/or dentures?: No Does patient usually wear dentures?: No  CIWA:  CIWA-Ar Total: 2 COWS:     Musculoskeletal: Strength & Muscle Tone: within normal limits no  current psychomotor agitation or restlessness Gait & Station: Reports she mobilizes with a walker due to RA/hip pain Patient leans: N/A  Psychiatric Specialty Exam: Physical Exam  Review of Systems denies headache, no chest pain, shortness of breath, no nausea, no vomiting  Blood pressure 108/72, pulse 81, temperature 97.6 F (36.4 C), temperature source Oral, resp. rate 18, height 5' 6" (1.676 m), weight 89.8 kg, SpO2 98 %, not currently breastfeeding.Body mass index is 31.96 kg/m.  General Appearance: Fairly Groomed  Eye Contact:  Good  Speech:  Normal Rate  Volume:  Normal  Mood:  Reports she is feeling better than on admission  Affect:  Appropriate and Vaguely anxious  Thought Process:  Linear and Descriptions of Associations: Intact  Orientation:  Other:  Fully alert and attentive  Thought Content:  Currently denies hallucinations and does not appear internally preoccupied.  No delusions are expressed at this time.  Suicidal Thoughts:  No at present denies suicidal or self-injurious ideations and contracts for safety on unit, denies homicidal or violent ideations  Homicidal Thoughts:  No  Memory:  Recent and remote grossly intact  Judgement:  Other:  Fair/improving  Insight:  Fair  Psychomotor Activity:  Normal-no tremors, no psychomotor agitation  Concentration:  Concentration: Improving and Attention Span: Improving  Recall:  Good  Fund of Knowledge:  Good  Language:  Good  Akathisia:  Negative  Handed:  Right  AIMS (if indicated):     Assets:  Communication Skills Desire for Improvement Resilience  ADL's:  Intact  Cognition:  WNL  Sleep:  Number of Hours: 6.5   Assessment:  39 year old female, history of schizoaffective disorder .  History of multiple prior psychiatric admissions.  History of seizure disorder.  Presented to ED on 3/13 following a suicide attempt by overdose on Keppra, which she is prescribed.  She reports she has been facing significant  stressors/losses.  She is currently homeless, her son is in foster care, she states she  has been unable to see him for a prolonged period of time due to Covid epidemic.  States that foster family supposed to "send me photographs but they have not done it".  She also reports an uncle who was very close to her died last year.  Today patient reports some improvement compared to admission.  Currently denies suicidal ideations and contracts for safety on unit.  She does remain ruminative regarding stressors, as above.  She is visible in dayroom and behaviors in good control, presents calm and polite on approach.  She has a history of seizure disorder.  Will resume Keppra, which she states she has been on for years without side effects. Treatment Plan Summary: Daily contact with patient to assess and evaluate symptoms and progress in treatment, Medication management, Plan Inpatient treatment and Medication as below Encourage group and milieu participation Continue Neurontin 3 mg twice daily for anxiety/seizures Continue Prozac 20 mg daily for depression Continue BuSpar 10 mg 3 times daily for anxiety Resume Keppra at 500 mg twice daily for history of seizures Continue Trazodone 50 mg nightly as needed for insomnia as needed Continue Vistaril 25 mg every 8 hours as needed for anxiety as needed Treatment team working on disposition planning options Jenne Campus, MD 11/03/2019, 11:55 AM

## 2019-11-03 NOTE — Progress Notes (Signed)
Recreation Therapy Notes  Animal-Assisted Activity (AAA) Program Checklist/Progress Notes Patient Eligibility Criteria Checklist & Daily Group note for Rec Tx Intervention  Date: 3.16.21 Time: 21 Location: [address]   AAA/T Program Assumption of Risk Form signed by Teacher, music or Parent Legal Guardian YES  Patient is free of allergies or sever asthma YES  Patient reports no fear of animals YES   Patient reports no history of cruelty to animals YES  Patient understands his/her participation is voluntary YES   Patient washes hands before animal contact YES   Patient washes hands after animal contact YES  Behavioral Response: Engaged  Education: Contractor, Appropriate Animal Interaction   Education Outcome: Acknowledges understanding/In group clarification offered/Needs additional education.   Clinical Observations/Feedback: Pt attended and participated in activity.    Victorino Sparrow, LRT/CTRS         Victorino Sparrow A 11/03/2019 3:33 PM

## 2019-11-04 MED ORDER — HYDROXYZINE HCL 25 MG PO TABS: 25.0000 mg | ORAL_TABLET | Freq: Three times a day (TID) | ORAL | 0 refills | Status: DC | PRN

## 2019-11-04 MED ORDER — TRAZODONE HCL 50 MG PO TABS: 50.0000 mg | ORAL_TABLET | Freq: Every evening | ORAL | 0 refills | Status: DC | PRN

## 2019-11-04 MED ORDER — LEVETIRACETAM 500 MG PO TABS: 500.0000 mg | ORAL_TABLET | Freq: Two times a day (BID) | ORAL | 0 refills | Status: DC

## 2019-11-04 MED ORDER — FLUOXETINE HCL 20 MG PO CAPS: 20.0000 mg | ORAL_CAPSULE | Freq: Every day | ORAL | 0 refills | Status: DC

## 2019-11-04 MED ORDER — BUSPIRONE HCL 10 MG PO TABS: 10.0000 mg | ORAL_TABLET | Freq: Three times a day (TID) | ORAL | 1 refills | Status: DC

## 2019-11-04 MED ORDER — PRAZOSIN HCL 2 MG PO CAPS: 4.0000 mg | ORAL_CAPSULE | Freq: Every day | ORAL | 1 refills | Status: DC

## 2019-11-04 MED ORDER — NICOTINE POLACRILEX 2 MG MT GUM: 2.0000 mg | CHEWING_GUM | OROMUCOSAL | 0 refills | Status: DC | PRN

## 2019-11-04 MED ORDER — GABAPENTIN 300 MG PO CAPS: 300.0000 mg | ORAL_CAPSULE | Freq: Two times a day (BID) | ORAL | 0 refills | Status: DC

## 2019-11-04 NOTE — Progress Notes (Signed)
Patient was evaluated by PT one day before discharge.   CSW attempted to obtain a rolling walker/cane for the patient at discharge. CSW was informed that the patient's insurance does not cover DME services at this time. Patient is unable to receive rolling walker or cane from [employer].   Per [employer] representative, the patient is recommended to contact charity care providers for DME services at discharge. CSW provided resources to the patient that presented low cost options. Patient expressed understanding and thanked CSW for resources.    CSW signing off.   Radonna Ricker, MSW, LCSW Clinical Social Worker [facility]  Phone: [phone]

## 2019-11-04 NOTE — Progress Notes (Signed)
Discharge Note:  Patient discharged home with [employer].  Patient denied SI and HI.  Denied A/V hallucinations.  Suicide prevention information given and discussed with patient who stated she understood and had no questions.  Patient stated she received all her belongings, clothing, toiletries, etc.  Patient stated she appreciated all assistance received from [facility] staff.  All required discharge information given to patient at discharge.

## 2019-11-04 NOTE — Progress Notes (Addendum)
  [facility] Adult Case Management Discharge Plan :  Will you be returning to the same living situation after discharge:  No.  Pt reports that she will be staying at her fiance's aunt's home.  At discharge, do you have transportation home?: Yes,  CSW arranged Safe Transport  Do you have the ability to pay for your medications: Yes,  Generic Medicare Advantage  Release of information consent forms completed and in the chart;  Patient's signature needed at discharge.  Patient to Follow up at: Follow-up Information    [facility]. Go on 11/11/2019.   Why: You are scheduled for an appointment on 11/11/19 at 10:00 am.  This appointment will be held in person. Please be sure to bring any discharge paperwork from this hospitalization, including your list of medications.  Contact information: [address] [place] [address] [phone]           Next level of care provider has access to [facility] and Suicide Prevention discussed: No.  SPE completed with the patient, patient reports that she and her fiance both do not have a working phone.   Have you used any form of tobacco in the last 30 days? (Cigarettes, Smokeless Tobacco, Cigars, and/or Pipes): Yes  Has patient been referred to the Quitline?: Patient refused referral  Patient has been referred for addiction treatment: Pt. refused referral  Rozann Lesches, LCSW 11/04/2019, 10:00 AM

## 2019-11-04 NOTE — Progress Notes (Signed)
Recreation Therapy Notes  Date:  3.17.21 Time: 0930 Location: [address] Day[address]  Group Topic: Stress Management  Goal Area(s) Addresses:  Patient will identify positive stress management techniques. Patient will identify benefits of using stress management post d/c.  Behavioral Response: Engaged  Intervention: Stress Management  Activity : Guided Imagery.  LRT read a script that took patients on a journey through a walk in the forest.  Patients were to listen and follow along as LRT read script to engage in activity.  Education:  Stress Management, Discharge Planning.   Education Outcome: Acknowledges Education  Clinical Observations/Feedback:  Pt attended and participated in activity.    Victorino Sparrow, LRT/CTRS    Victorino Sparrow A 11/04/2019 10:35 AM

## 2019-11-04 NOTE — Progress Notes (Signed)
D:  Patient's self inventory sheet, patient sleeps good, sleep medication helpful.  Good appetite, normal energy level, good concentration.  Denied depression, hopeless and anxiety.  Denied withdrawals.  Denied SI.  Physical problems, pain, R leg, worst pain #8 in past 24 hours.  Goal is make people laugh.  Plans to tell jokes.  Does have discharge plans. A:  Medications administered per MD orders.  Emotional support and encouragement given patient. R:  Denied SI and HI, contracts for safety.  Denied A/V hallucinations.  Safety maintained with 15 minute checks.

## 2019-11-04 NOTE — Discharge Summary (Addendum)
Physician Discharge Summary Note  Patient:  Amanda [name] is an 39 y.o., adult  MRN:  [id number]  DOB:  [birth year]  Patient phone:  [phone] (home)   Patient address:   [address] [place] [address],   Total Time spent with patient: Greater than 30 minutes  Date of Admission:  11/01/2019  Date of Discharge: 11/04/19  Reason for Admission: Suicide attempt by overdose on Keppra.  Principal Problem: MDD (major depressive disorder), recurrent severe, without psychosis ([place])  Discharge Diagnoses: Principal Problem:   MDD (major depressive disorder), recurrent severe, without psychosis ([place]) Active Problems:   Schizoaffective disorder, depressive type ([place])  Past Psychiatric History:  Very extensive, multiple suicide attempts.  Past Medical History:  Past Medical History:  Diagnosis Date  . Anxiety   . Asthma   . Asthma due to environmental allergies   . Asthma due to seasonal allergies   . Bipolar 1 disorder ([place])   . Borderline personality disorder ([place])   . Brain bleed ([place])   . Chronic post-traumatic stress disorder (PTSD)    complex chronic with psychotic features and self harm behaviors  . Complication of anesthesia   . Constipation   . Dander (animal) allergy   . Hearing loss    right ear  . Heart murmur    denies seeing a cardiologist  . Hip dysplasia   . Hypertension    Gestional   . Major depression, chronic   . Mood disorder ([place])   . OCD (obsessive compulsive disorder)   . Pneumonia   . PONV (postoperative nausea and vomiting)   . PTSD (post-traumatic stress disorder)   . RA (rheumatoid arthritis) ([place])   . Rheumatoid arthritis ([place])   . Schizophrenia ([place])    "I think that is wrong"  . Seizure disorder ([place])   . Suicidal ideations   . Suicide attempt [facility])     Past Surgical History:  Procedure Laterality Date  . Brain Shunt  1981   "a few hours old"  . EYE SURGERY Bilateral 1985  . INTRAUTERINE DEVICE (IUD) INSERTION N/A  09/16/2018   Procedure: INTRAUTERINE DEVICE (IUD) INSERTION;  Surgeon: Lavonia Drafts, MD;  Location: [facility];  Service: Gynecology;  Laterality: N/A;  . PERINEUM REPAIR N/A 09/16/2018   Procedure: EPISIOTOMY REVISION;  Surgeon: Lavonia Drafts, MD;  Location: [facility];  Service: Gynecology;  Laterality: N/A;  . [place]  . Tooth Removal     multiple   Family History:  Family History  Problem Relation Age of Onset  . Heart attack Mother   . Stroke Mother   . Liver cancer Mother   . Diabetes Mother   . Lung cancer Mother   . Alcoholism Father   . Sleep apnea Brother   . Depression Brother   . ADD / ADHD Brother   . Diabetes Maternal Aunt   . Diabetes Paternal Uncle   . Colon cancer Paternal Uncle   . Diabetes Maternal Grandmother   . Dementia Maternal Grandmother    Family Psychiatric  History: See H&P  Social History:  Social History   Substance and Sexual Activity  Alcohol Use Not Currently     Social History   Substance and Sexual Activity  Drug Use Not Currently  . Types: Marijuana   Comment:  last used in Jan 2019 due to "RA" per pt    Social History   Socioeconomic History  . Marital status: Legally Separated    Spouse name: Not on  file  . Number of children: 1  . Years of education: Not on file  . Highest education level: Not on file  Occupational History  . Not on file  Tobacco Use  . Smoking status: Current Every Day Smoker    Packs/day: 0.50    Years: 26.00    Pack years: 13.00    Types: Cigarettes  . Smokeless tobacco: Never Used  Substance and Sexual Activity  . Alcohol use: Not Currently  . Drug use: Not Currently    Types: Marijuana    Comment:  last used in Jan 2019 due to "RA" per pt  . Sexual activity: Not Currently    Birth control/protection: None  Other Topics Concern  . Not on file  Social History Narrative   ** Merged History Encounter **       Lives with her fiance, friend, and fiance's brother   Right  handed   Social Determinants of Health   Financial Resource Strain:   . Difficulty of Paying Living Expenses:   Food Insecurity:   . Worried About Charity fundraiser in the Last Year:   . Arboriculturist in the Last Year:   Transportation Needs:   . Film/video editor (Medical):   Marland Kitchen Lack of Transportation (Non-Medical):   Physical Activity:   . Days of Exercise per Week:   . Minutes of Exercise per Session:   Stress:   . Feeling of Stress :   Social Connections:   . Frequency of Communication with Friends and Family:   . Frequency of Social Gatherings with Friends and Family:   . Attends Religious Services:   . Active Member of Clubs or Organizations:   . Attends Archivist Meetings:   Marland Kitchen Marital Status:    Hospital Course: (Per Md's admission evaluation notes): Patient is a 39 year old female with a past psychiatric history significant for schizoaffective disorder; bipolar type who presented to the [facility] emergency department on 10/31/2019. The patient stated that she had ingested 24 Keppra tablets earlier on that date to end her life. She stated she had been under a great deal of stress recently. She had been "couch surfing", and had other stressors in her life. Her godfather who she was rather close to had died last year, and was still having grief issues over that. She also stated her child had been in child protective services since last year, and that was because of her continued psychiatric issues. She has had multiple psychiatric admissions and multiple attempts at self-harm. She was last seen at [facility] emergency department for suicidal ideation on 11/10/2018. She had not been on her medications for at least a year. She had been receiving services at family services of the [place]. She was monitored at [facility] given the overdose, and was cleared medically. She was placed on gabapentin prophylactically for seizure prevention.  She admitted to helplessness, hopelessness and worthlessness. She stated that she had pain issues as well. She is admitted to the hospital for evaluation and stabilization.  This is one of several psychiatric discharge summaries for this 39 year old Caucasian female with hx of chronic mental illness & multiple psychiatric admissions. She is known in this [facility] & other psychiatric hospitals within the surrounding areas for worsening symptoms & exacerbation of her mental illness. She was discharged last from [facility] on 11-13-18 with prescriptions & follow-up care recommendations. She was brought to the [facility] this time around for evaluation &  treatment after an overdose attempt on Keppra.   After evaluation of her presenting symptoms, Lizzeth was recommended for mood stabilization treatments. The medication regimen for her presenting symptoms were discussed & with her consent initiated. She received, stabilized & was discharged on the medications as listed below on her discharge medication lists. She was also enrolled & participated in the group counseling sessions being offered & held on this unit. She learned coping skills. She presented on this admission, other chronic medical conditions that required treatment & monitoring. She was resumed/discharged on all her pertinent home medications for those health issues. She tolerated her treatment regimen without any adverse effects or reactions reported.  Lilja's symptoms responded well to her treatment regimen. This is evidenced by her reports of improved mood & absence of suicidal ideations, plans or intent. She is currently appears to be both mentally & medically stable to discharged to her place of residence to continue mental health care on an outpatient basis as noted below. During the course of her hospitalization, the 15-minute checks were adequate to ensure Josalin's safety.  Patient did not display any dangerous, violent or suicidal behavior on the unit.  She  interacted with patients & staff appropriately, participated appropriately in the group sessions/therapies. Her medications were addressed & adjusted to meet her needs. She was recommended for outpatient follow-up care & medication management upon discharge to assure her continuity of care.  At the time of discharge patient is not reporting any acute suicidal/homicidal ideations. She feels more confident about her self-care & in managing the suicidal thoughts. She currently denies any new issues or concerns. Education and supportive counseling provided throughout her hospital stay & upon discharge.  Today upon her discharge evaluation with the attending psychiatrist, Lakiera shares she is doing well. She denies any other specific concerns. She is sleeping well. Her appetite is good. She denies other physical complaints. She denies AH/VH. She feels that her medications have been helpful & is in agreement to continue her current treatment regimen as recommended. She was able to engage in safety planning including plan to return to [facility] or contact emergency services if she feels unable to maintain her own safety or the safety of others. Pt had no further questions, comments, or concerns. She left [facility] with all personal belongings in no apparent distress. Transportation per her the [employer], arranged by the CSW.  Physical Findings: AIMS: Facial and Oral Movements Muscles of Facial Expression: None, normal Lips and Perioral Area: None, normal Jaw: None, normal Tongue: None, normal,Extremity Movements Upper (arms, wrists, hands, fingers): None, normal Lower (legs, knees, ankles, toes): None, normal, Trunk Movements Neck, shoulders, hips: None, normal, Overall Severity Severity of abnormal movements (highest score from questions above): None, normal Incapacitation due to abnormal movements: None, normal Patient's awareness of abnormal movements (rate only patient's report): No Awareness, Dental  Status Current problems with teeth and/or dentures?: No Does patient usually wear dentures?: No  CIWA:  CIWA-Ar Total: 2 COWS:     Musculoskeletal: Strength & Muscle Tone: within normal limits Gait & Station: normal Patient leans: N/A  Psychiatric Specialty Exam: Physical Exam  Nursing note and vitals reviewed. Constitutional: He is oriented to person, place, and time. He appears well-developed and well-nourished.  Cardiovascular: Normal rate.  Respiratory: Effort normal.  Genitourinary:    Genitourinary Comments: Deferred   Musculoskeletal:        General: Normal range of motion.     Cervical back: Normal range of motion.  Neurological: He  is alert and oriented to person, place, and time.  Skin: Skin is warm and dry.    Review of Systems  Constitutional: Negative.  Negative for chills, fever and malaise/fatigue.  HENT: Negative for congestion and sore throat.   Respiratory: Negative for cough, shortness of breath and wheezing.   Cardiovascular: Negative for chest pain and palpitations.  Gastrointestinal: Negative for heartburn, nausea and vomiting.  Genitourinary: Negative for dysuria.  Musculoskeletal: Negative for joint pain and myalgias.  Skin: Negative for itching and rash.  Neurological: Negative for dizziness, focal weakness, seizures and headaches.  Endo/Heme/Allergies: Positive for environmental allergies (Yellow jacket Venom,  Shell fish).       Aspertame, Phenylalanine, Benadryl  Psychiatric/Behavioral: Positive for depression (Stabilized with medication upon discharge) and hallucinations (Hx of (Stable)). Negative for memory loss, substance abuse and suicidal ideas. The patient has insomnia (Stabilized with medication prior to discharge). The patient is not nervous/anxious (Stable).     Blood pressure 116/74, pulse 78, temperature 97.9 F (36.6 C), temperature source Oral, resp. rate 18, height 5\' 6"  (1.676 m), weight 89.8 kg, SpO2 98 %, not currently  breastfeeding.Body mass index is 31.96 kg/m.  See Md's discharge SRA  Sleep:  Number of Hours: 9   Have you used any form of tobacco in the last 30 days? (Cigarettes, Smokeless Tobacco, Cigars, and/or Pipes): Yes  Has this patient used any form of tobacco in the last 30 days? (Cigarettes, Smokeless Tobacco, Cigars, and/or Pipes): Yes, an FDA-approved medication for tobacco cessation was recommended at discharge.   Blood Alcohol level:  Lab Results  Component Value Date   ETH <10 10/31/2019   ETH <10 [id number]   Metabolic Disorder Labs:  Lab Results  Component Value Date   HGBA1C 5.3 11/11/2018   MPG 105.41 11/11/2018   Lab Results  Component Value Date   PROLACTIN 36.9 (H) 11/11/2018   No results found for: CHOL, TRIG, HDL, CHOLHDL, VLDL, LDLCALC  See Psychiatric Specialty Exam and Suicide Risk Assessment completed by Attending Physician prior to discharge.  Discharge destination:  Home  Is patient on multiple antipsychotic therapies at discharge:  No   Has Patient had three or more failed trials of antipsychotic monotherapy by history:  No  Recommended Plan for Multiple Antipsychotic Therapies: NA   Allergies as of 11/04/2019      Reactions   Aspartame And Phenylalanine Anaphylaxis, Hives, Diarrhea   Benadryl [diphenhydramine] Anaphylaxis, Diarrhea, Other (See Comments)   Blisters   Other Anaphylaxis, Nausea And Vomiting, Rash, Other (See Comments)   Aspartame- Blisters Dust- Worsens asthma Ragweed- Worsens asthma, face gets red, and sneezing Animal Fur/Dander- Worsens asthma and sneezing   Scallops [shellfish Allergy] Anaphylaxis, Diarrhea, Nausea And Vomiting   Yellow Jacket Venom [bee Venom] Anaphylaxis, Diarrhea, Nausea And Vomiting   Seizures and numbness   Pollen Extract Other (See Comments)   Runny nose, eyes, and asthma worsens   Tegretol [carbamazepine] Hives, Diarrhea, Other (See Comments)   Blisters in mouth and increase in seizures   Adhesive [tape]  Rash   Latex Hives, Rash   Blisters, also- Condoms and dental encounters      Medication List    STOP taking these medications   EPINEPHrine 0.3 mg/0.3 mL Soaj injection Commonly known as: EPI-PEN   famotidine 20 MG tablet Commonly known as: PEPCID   labetalol 200 MG tablet Commonly known as: NORMODYNE   omeprazole 20 MG capsule Commonly known as: PRILOSEC   ondansetron 4 MG disintegrating tablet Commonly known as: Zofran  ODT   prazosin 2 MG capsule Commonly known as: MINIPRESS   predniSONE 20 MG tablet Commonly known as: DELTASONE   temazepam 30 MG capsule Commonly known as: RESTORIL     TAKE these medications     Indication  busPIRone 10 MG tablet Commonly known as: BUSPAR Take 1 tablet (10 mg total) by mouth 3 (three) times daily. For anxiety What changed:   medication strength  how much to take  additional instructions  Indication: Anxiety Disorder   FLUoxetine 20 MG capsule Commonly known as: PROZAC Take 1 capsule (20 mg total) by mouth daily. For depression What changed: additional instructions  Indication: Major Depressive Disorder   gabapentin 300 MG capsule Commonly known as: NEURONTIN Take 1 capsule (300 mg total) by mouth 2 (two) times daily. For agitation  Indication: Agitation   hydrOXYzine 25 MG tablet Commonly known as: ATARAX/VISTARIL Take 1 tablet (25 mg total) by mouth 3 (three) times daily as needed for anxiety. What changed:   when to take this  reasons to take this  Another medication with the same name was removed. Continue taking this medication, and follow the directions you see here.  Indication: Feeling Anxious   levETIRAcetam 500 MG tablet Commonly known as: KEPPRA Take 1 tablet (500 mg total) by mouth 2 (two) times daily. For seizure disorder What changed:   medication strength  how much to take  additional instructions  Indication: Seizure disorder   nicotine polacrilex 2 MG gum Commonly known as:  NICORETTE Take 1 each (2 mg total) by mouth as needed. (May buy from over the counter): For smoking cessation  Indication: Nicotine Addiction   traZODone 50 MG tablet Commonly known as: DESYREL Take 1 tablet (50 mg total) by mouth at bedtime as needed for sleep.  Indication: Trouble Sleeping      Follow-up [facility]. Go on 11/11/2019.   Why: You are scheduled for an appointment on 11/11/19 at 10:00 am.  This appointment will be held in person. Please be sure to bring any discharge paperwork from this hospitalization, including your list of medications.  Contact information: [address] [place] [address] [phone]          Follow-up recommendations: Activity:  As tolerated Diet: As recommended by your primary care doctor. Keep all scheduled follow-up appointments as recommended.  Comments:  Prescriptions given at discharge.  Patient agreeable to plan.  Given opportunity to ask questions.  Appears to feel comfortable with discharge denies any current suicidal or homicidal thought. Patient is also instructed prior to discharge to: Take all medications as prescribed by his/her mental healthcare provider. Report any adverse effects and or reactions from the medicines to his/her outpatient provider promptly. Patient has been instructed & cautioned: To not engage in alcohol and or illegal drug use while on prescription medicines. In the event of worsening symptoms, patient is instructed to call the crisis hotline, 911 and or go to the nearest ED for appropriate evaluation and treatment of symptoms. To follow-up with his/her primary care provider for your other medical issues, concerns and or health care needs.  Signed: Lindell Spar, NP, [facility], FNP-BC 11/04/2019, 9:30 AM   Patient seen, Suicide Assessment Completed.  Disposition Plan Reviewed

## 2019-11-04 NOTE — [facility] Suicide Risk Assessment (Signed)
[facility] Discharge Suicide Risk Assessment   Principal Problem: MDD (major depressive disorder), recurrent severe, without psychosis ([place]) Discharge Diagnoses: Principal Problem:   MDD (major depressive disorder), recurrent severe, without psychosis ([place]) Active Problems:   Schizoaffective disorder, depressive type ([place])   Total Time spent with patient: 30 minutes  Musculoskeletal: Strength & Muscle Tone: within normal limits Gait & Station: normal-ambulates with walker Patient leans: N/A  Psychiatric Specialty Exam: Review of Systems today denies headache, no shortness of breath, no chest pain, reports some pain on ambulation related to hip discomfort/RA.  No fever, no chills.  Blood pressure 116/74, pulse 78, temperature 97.9 F (36.6 C), temperature source Oral, resp. rate 18, height 5\' 6"  (1.676 m), weight 89.8 kg, SpO2 98 %, not currently breastfeeding.Body mass index is 31.96 kg/m.  General Appearance: Well Groomed  Eye Contact::  Good  Speech:  Normal [id number]  Volume:  Normal  Mood:  Reports "I feel a lot better than when I came in".  Currently minimizes depression and states her mood is "okay"  Affect:  Appropriate and Reactive  Thought Process:  Linear and Descriptions of Associations: Intact  Orientation:  Other:  Fully alert and attentive  Thought Content:  No hallucinations, no delusions, does not currently present internally preoccupied  Suicidal Thoughts:  No currently denies suicidal or self-injurious ideations  Homicidal Thoughts:  No denies any homicidal or violent ideations  Memory:  Recent and remote grossly intact  Judgement:  Other:  Improving  Insight:  Fair/improving  Psychomotor Activity:  No psychomotor agitation or restlessness  Concentration:  Good  Recall:  Good  Fund of Knowledge:Good  Language: Good  Akathisia:  Negative  Handed:  Right  AIMS (if indicated):     Assets:  Communication Skills Desire for Improvement Resilience  Sleep:  Number of  Hours: 9  Cognition: WNL  ADL's:  Intact   Mental Status Per Nursing Assessment::   On Admission:  Suicidal ideation indicated by patient, Suicidal ideation indicated by others, Suicide plan, Intention to act on suicide plan  Demographic Factors:  39 year old female  Loss Factors: Homelessness, loss of loved ones (godfather passed away last year), not been able to see her son who is currently in the care of CPS.  Historical Factors: Prior history of several psychiatric admissions.  In the past has been diagnosed with bipolar disorder, schizoaffective disorder, PTSD.  Past history of self cutting.  Risk Reduction Factors:   Positive coping skills or problem solving skills  Continued Clinical Symptoms:  Today patient presents alert, attentive, calm, pleasant on approach, behavior in good control.  Describes mood is improved.  Affect appears appropriate and full in range at this time.  Currently presents euthymic.  No thought disorders noted.  Denies suicidal ideations.  No homicidal or violent ideations.  No hallucinations, no delusions, currently presents future oriented.  States that her significant other and her are planning to stay with his aunt for period of time until they are able to secure some money is in order to obtain a place to live. She has had no seizures on unit.  She is now back on Keppra which she has been on for years without side effects. Denies medication side effects. Her behavior on unit is in good control and is noted to be socializing appropriately with selected peers in dayroom.   Cognitive Features That Contribute To Risk:  No gross cognitive deficits noted upon discharge. Is alert , attentive, and oriented x 3    Suicide  Risk:  Mild:  Suicidal ideation of limited frequency, intensity, duration, and specificity.  There are no identifiable plans, no associated intent, mild dysphoria and related symptoms, good self-control (both objective and subjective  assessment), few other risk factors, and identifiable protective factors, including available and accessible social support.  Follow-up Information    [facility]. Go on 11/11/2019.   Why: You are scheduled for an appointment on 11/11/19 at 10:00 am.  This appointment will be held in person. Please be sure to bring any discharge paperwork from this hospitalization, including your list of medications.  Contact information: [address] [place] [address] [phone]           Plan Of Care/Follow-up recommendations:  Activity:  As tolerated Diet:  Regular Tests:  NA Other:  See below  Patient is expressing readiness for discharge and is leaving unit in good spirits.  There are no current grounds for involuntary commitment.  As above, states she plans to stay at her significant other's aunt for period of time.  She reports she has an established PCP, Dr. Deneise Lever and an established neurologist at [facility] neurology for management of her seizure disorder.  I have advised for patient not to drive or operate any heavy machinery unless specifically approved by her neurologist. Of note, patient is ambulating with walker-PT has evaluated patient and recommended rolling walker at discharge.  Jenne Campus, MD 11/04/2019, 9:48 AM

## 2019-11-04 NOTE — B[facility] Group Notes (Signed)
LCSW Group Therapy Note  11/04/2019 1:30 PM  Type of Therapy/Topic:  Group Therapy:  Emotion Regulation  Participation Level:  Did Not Attend   Description of Group:   The purpose of this group is to assist patients in learning to regulate negative emotions and experience positive emotions. Patients will be guided to discuss ways in which they have been vulnerable to their negative emotions. These vulnerabilities will be juxtaposed with experiences of positive emotions or situations, and patients will be challenged to use positive emotions to combat negative ones. Special emphasis will be placed on coping with negative emotions in conflict situations, and patients will process healthy conflict resolution skills.  Therapeutic Goals: 1. Patient will identify two positive emotions or experiences to reflect on in order to balance out negative emotions 2. Patient will label two or more emotions that they find the most difficult to experience 3. Patient will demonstrate positive conflict resolution skills through discussion and/or role plays  Summary of Patient Progress: X  Therapeutic Modalities:   Cognitive Behavioral Therapy Feelings Identification Dialectical Behavioral Therapy  Amanda [name], MSW, LCSW 11/04/2019 1:16 PM

## 2019-11-04 NOTE — Progress Notes (Signed)
Adult Psychoeducational Group Note  Date:  11/04/2019 Time:  10:22 AM  Group Topic/Focus:  Goals Group:   The focus of this group is to help patients establish daily goals to achieve during treatment and discuss how the patient can incorporate goal setting into their daily lives to aide in recovery.  Participation Level:  Active  Participation Quality:  Appropriate  Affect:  Excited  Cognitive:  Appropriate  Insight: Appropriate and Good  Engagement in Group:  Engaged  Modes of Intervention:  Discussion  Additional Comments:  Pt attended and participated in morning goals group. Pt has a goal of finalizing discharge today.  Amanda [name] 11/04/2019, 10:22 AM

## 2019-12-25 ENCOUNTER — Other Ambulatory Visit (HOSPITAL_COMMUNITY): Payer: Self-pay | Admitting: Neurology

## 2019-12-25 ENCOUNTER — Encounter: Payer: Self-pay | Admitting: *Deleted

## 2019-12-25 ENCOUNTER — Telehealth: Payer: Self-pay | Admitting: *Deleted

## 2019-12-25 ENCOUNTER — Telehealth: Payer: Self-pay | Admitting: Family Medicine

## 2019-12-25 MED ORDER — BUTALBITAL-APAP-CAFFEINE 50-325-40 MG PO TABS: 1.0000 | ORAL_TABLET | Freq: Four times a day (QID) | ORAL | 0 refills | Status: DC | PRN

## 2019-12-25 NOTE — Congregational Nurse Program (Signed)
  Dept: [facility] Nurse Program Note  Date of Encounter: 12/25/2019  Past Medical History: Past Medical History:  Diagnosis Date  . Anxiety   . Asthma   . Asthma due to environmental allergies   . Asthma due to seasonal allergies   . Bipolar 1 disorder ([place])   . Borderline personality disorder ([place])   . Brain bleed ([place])   . Chronic post-traumatic stress disorder (PTSD)    complex chronic with psychotic features and self harm behaviors  . Complication of anesthesia   . Constipation   . Dander (animal) allergy   . Hearing loss    right ear  . Heart murmur    denies seeing a cardiologist  . Hip dysplasia   . Hypertension    Gestional   . Major depression, chronic   . Mood disorder ([place])   . OCD (obsessive compulsive disorder)   . Pneumonia   . PONV (postoperative nausea and vomiting)   . PTSD (post-traumatic stress disorder)   . RA (rheumatoid arthritis) ([place])   . Rheumatoid arthritis ([place])   . Schizophrenia ([place])    "I think that is wrong"  . Seizure disorder ([place])   . Suicidal ideations   . Suicide attempt [facility])     Encounter Details: CNP Questionnaire - 12/25/19 1349      Questionnaire   Patient Status  Not Applicable    Race  White or Caucasian    Location Patient Served At  [employer]    Uninsured  Not Applicable    Food  No food insecurities    Housing/Utilities  No permanent housing   working with [employer] Ending Homelessness   Transportation  Yes, need transportation assistance    Interpersonal Safety  No, do not feel physically and emotionally safe where you currently live   pt currently living in a tent   Medication  No medication insecurities    Medical Provider  Yes    Referrals  [place] Provider    ED Visit Averted  Not Applicable    Life-Saving Intervention Made  Not Applicable      Pt came to [facility] to get mail and requested help with a mental health provider. Pt reports she was at [facility] in  March and is currently living in a tent working with [employer] [facility]. She has some medications and a PCP. Contacted [facility] by phone and she was going to have to wait two hours for intake. Client has another appointment this afternoon. She decided to wait until Tuesday and go to [place] as a walk in client. Address was written and two bus passes given. Client denies si and hi.  Dmarco Baldus W. RN [phone]

## 2019-12-25 NOTE — Telephone Encounter (Signed)
Pt called stating that she has had a migraine for 3 days with blurred vision and that she was informed that she needed to speak to her neurologist right away do to her medical history. Please advise.

## 2019-12-25 NOTE — Telephone Encounter (Signed)
I returned patient's phone call informed me that she was having a migraine headache which was going on for 3 days and the Tylenol which she normally takes was not helping.  She has had similar headaches in the past which have not lasted as long.  She is afraid to take stronger pain medication because it upsets her stomach.  I tried E prescribing Fioricet 1 to 2 tablets every 6 hourly as needed but was not able to authenticated over my phone hence I printed it in the office to be signed by physician there

## 2019-12-25 NOTE — Telephone Encounter (Signed)
Called to set up an appointment with [facility]. Client decided to wait and go as a walk in pt next Tuesday.

## 2019-12-25 NOTE — Telephone Encounter (Signed)
Pt returned call. Please call back when available. 

## 2019-12-25 NOTE — Telephone Encounter (Signed)
I called the patient but got answering machine and left a message for her to call back if needed

## 2019-12-28 NOTE — Telephone Encounter (Signed)
I did not find printed prescription in the office (pod 4 or dr sethi's printer).

## 2019-12-28 NOTE — Addendum Note (Signed)
Addended by: Brandon Melnick on: 12/28/2019 09:44 AM   Modules accepted: Orders

## 2019-12-28 NOTE — Telephone Encounter (Signed)
I called pt and relayed that since we have only seen her for sz, she would need to be evaluated by her pcp initally and if they feels needs to see neuro, then MD would see her then NP after that.  She verbalized understanding.  She still has headache.  This is a sporadic, stress, allergies, can trigger for her.  She will see pcp and if needs [place] will refer.  She verbalized understanding.

## 2020-01-14 ENCOUNTER — Ambulatory Visit: Payer: Medicare (Managed Care) | Attending: Internal Medicine

## 2020-01-14 DIAGNOSIS — Z23 Encounter for immunization: Secondary | ICD-10-CM

## 2020-01-14 NOTE — Progress Notes (Signed)
   Covid-19 Vaccination Clinic  Name:  Amanda [name]    MRN: [id number] DOB: 1981/07/13  01/14/2020  [name] was observed post Covid-19 immunization for 15 minutes without incident. He was provided with Vaccine Information Sheet and instruction to access the V-Safe system.   [name] was instructed to call 911 with any severe reactions post vaccine: Marland Kitchen Difficulty breathing  . Swelling of face and throat  . A fast heartbeat  . A bad rash all over body  . Dizziness and weakness   Immunizations Administered    Name Date Dose VIS Date Route   Moderna COVID-19 Vaccine 01/14/2020 12:38 PM 0.5 mL 07/2019 Intramuscular   Manufacturer: Moderna   Lot: [id number]   [id number]

## 2020-02-11 ENCOUNTER — Ambulatory Visit: Payer: Medicare (Managed Care) | Attending: Internal Medicine

## 2020-02-11 DIAGNOSIS — Z23 Encounter for immunization: Secondary | ICD-10-CM

## 2020-02-11 NOTE — Progress Notes (Signed)
   Covid-19 Vaccination Clinic  Name:  Amanda [name]    MRN: [id number] DOB: 1981-02-18  02/11/2020  [name] was observed post Covid-19 immunization for 15 minutes without incident. He was provided with Vaccine Information Sheet and instruction to access the V-Safe system.   [name] was instructed to call 911 with any severe reactions post vaccine: Marland Kitchen Difficulty breathing  . Swelling of face and throat  . A fast heartbeat  . A bad rash all over body  . Dizziness and weakness   Immunizations Administered    Name Date Dose VIS Date Route   Moderna COVID-19 Vaccine 02/11/2020 12:13 PM 0.5 mL 07/2019 Intramuscular   Manufacturer: Moderna   Lot: [id number]   [place]: [phone]

## 2020-03-13 ENCOUNTER — Emergency Department (HOSPITAL_COMMUNITY): Payer: Medicare (Managed Care)

## 2020-03-13 ENCOUNTER — Emergency Department (HOSPITAL_COMMUNITY)
Admission: EM | Admit: 2020-03-13 | Discharge: 2020-03-13 | Disposition: A | Discharge status: Home / Self Care | Payer: Medicare (Managed Care) | Attending: Emergency Medicine | Admitting: Emergency Medicine

## 2020-03-13 DIAGNOSIS — M79604 Pain in right leg: Secondary | ICD-10-CM | POA: Insufficient documentation

## 2020-03-13 DIAGNOSIS — R200 Anesthesia of skin: Secondary | ICD-10-CM | POA: Diagnosis not present

## 2020-03-13 DIAGNOSIS — Z91040 Latex allergy status: Secondary | ICD-10-CM | POA: Insufficient documentation

## 2020-03-13 DIAGNOSIS — J45909 Unspecified asthma, uncomplicated: Secondary | ICD-10-CM | POA: Insufficient documentation

## 2020-03-13 DIAGNOSIS — F17210 Nicotine dependence, cigarettes, uncomplicated: Secondary | ICD-10-CM | POA: Diagnosis not present

## 2020-03-13 DIAGNOSIS — R29898 Other symptoms and signs involving the musculoskeletal system: Secondary | ICD-10-CM

## 2020-03-13 DIAGNOSIS — R5383 Other fatigue: Secondary | ICD-10-CM | POA: Insufficient documentation

## 2020-03-13 LAB — BASIC METABOLIC PANEL
Anion gap: 12 (ref 5–15)
BUN: 12 mg/dL (ref 6–20)
CO2: 19 mmol/L — ABNORMAL LOW (ref 22–32)
Calcium: 9 mg/dL (ref 8.9–10.3)
Chloride: 109 mmol/L (ref 98–111)
Creatinine, Ser: 0.76 mg/dL (ref 0.44–1.00)
GFR calc Af Amer: >60 mL/min (ref >60)
GFR calc non Af Amer: >60 mL/min (ref >60)
Glucose, Bld: 104 mg/dL — ABNORMAL HIGH (ref 70–99)
Potassium: 4.2 mmol/L (ref 3.5–5.1)
Sodium: 140 mmol/L (ref 135–145)

## 2020-03-13 LAB — URINALYSIS, ROUTINE W REFLEX MICROSCOPIC
Bacteria, UA: NONE SEEN
Bilirubin Urine: NEGATIVE
Glucose, UA: NEGATIVE mg/dL
Ketones, ur: NEGATIVE mg/dL
Nitrite: NEGATIVE
Protein, ur: NEGATIVE mg/dL
Specific Gravity, Urine: 1.024 (ref 1.005–1.030)
pH: 6 (ref 5.0–8.0)

## 2020-03-13 LAB — CBC
HCT: 47.6 % — ABNORMAL HIGH (ref 36.0–46.0)
Hemoglobin: 16.8 g/dL — ABNORMAL HIGH (ref 12.0–15.0)
MCH: 31.2 pg (ref 26.0–34.0)
MCHC: 35.3 g/dL (ref 30.0–36.0)
MCV: 88.5 fL (ref 80.0–100.0)
Platelets: 228 10*3/uL (ref 150–400)
RBC: 5.38 MIL/uL — ABNORMAL HIGH (ref 3.87–5.11)
RDW: 12.9 % (ref 11.5–15.5)
WBC: 9.4 10*3/uL (ref 4.0–10.5)
nRBC: 0 % (ref 0.0–0.2)

## 2020-03-13 LAB — I-STAT BETA HCG BLOOD, ED (MC, WL, AP ONLY): I-stat hCG, quantitative: <5 m[IU]/mL (ref <5)

## 2020-03-13 LAB — MAGNESIUM: Magnesium: 2 mg/dL (ref 1.7–2.4)

## 2020-03-13 MED ORDER — METHOCARBAMOL 750 MG PO TABS
750.0000 mg | ORAL_TABLET | Freq: Two times a day (BID) | ORAL | 0 refills | Status: DC | PRN
Start: 2020-03-13 — End: 2020-11-29

## 2020-03-13 MED ORDER — LIDOCAINE 5 % EX PTCH: 1.0000 | MEDICATED_PATCH | CUTANEOUS | 0 refills | Status: DC

## 2020-03-13 MED ORDER — SODIUM CHLORIDE 0.9% FLUSH: 3.0000 mL | Freq: Once | INTRAVENOUS | Status: DC

## 2020-03-13 MED ORDER — ONDANSETRON 4 MG PO TBDP
4.0000 mg | ORAL_TABLET | Freq: Three times a day (TID) | ORAL | 0 refills | Status: DC | PRN
Start: 2020-03-13 — End: 2020-11-29

## 2020-03-13 MED ORDER — PREDNISONE 10 MG (21) PO TBPK
ORAL_TABLET | ORAL | 0 refills | Status: DC
Start: 2020-03-13 — End: 2020-11-29

## 2020-03-13 NOTE — ED Provider Notes (Signed)
[facility] EMERGENCY DEPARTMENT Provider Note   CSN: [id number] Arrival date & time: 03/13/20  1018     History Chief Complaint  Patient presents with  . Fatigue  . multiple complaints    Amanda [name] is a 38 y.o. adult presents today for right lower extremity pain and numbness onset 2 weeks ago.  Patient reports right leg has been completely numb for 2 weeks unclear cause.  Reports that leg will occasionally feel as if it is covered in ice however when patient significant other feels the extremity it is warm.  Denies any color change or swelling.  Additional symptom of increased fatigue over the past 2 weeks and sleeping more often than normal.  Patient attributes symptoms to hip dysplasia.  Denies fall/injury, fever/chills, chest pain/shortness of breath, cough/mopped assist, abdominal pain, nausea/vomiting, diarrhea, extremity swelling/color change, saddle or paresthesias, bowel/bladder incontinence, urinary retention, IV drug use or any additional concerns HPI     Past Medical History:  Diagnosis Date  . Anxiety   . Asthma   . Asthma due to environmental allergies   . Asthma due to seasonal allergies   . Bipolar 1 disorder ([place])   . Borderline personality disorder ([place])   . Brain bleed ([place])   . Chronic post-traumatic stress disorder (PTSD)    complex chronic with psychotic features and self harm behaviors  . Complication of anesthesia   . Constipation   . Dander (animal) allergy   . Hearing loss    right ear  . Heart murmur    denies seeing a cardiologist  . Hip dysplasia   . Hypertension    Gestional   . Major depression, chronic   . Mood disorder ([place])   . OCD (obsessive compulsive disorder)   . Pneumonia   . PONV (postoperative nausea and vomiting)   . PTSD (post-traumatic stress disorder)   . RA (rheumatoid arthritis) ([place])   . Rheumatoid arthritis ([place])   . Schizophrenia ([place])    "I think that is wrong"  . Seizure disorder ([place])     . Suicidal ideations   . Suicide attempt [facility])     Patient Active Problem List   Diagnosis Date Noted  . MDD (major depressive disorder), recurrent severe, without psychosis ([place]) 11/01/2019  . Major depressive disorder, recurrent episode, severe ([place]) 11/13/2018  . Bipolar 1 disorder, depressed ([place]) 11/10/2018  . Seizures ([place]) 09/23/2018  . Disruption of episiotomy wound in the puerperium 09/04/2018  . Postpartum care following vaginal delivery 07/28/2018  . Encounter for IUD insertion 07/24/2018  . Gestational hypertension 06/18/2018  . Pap smear of cervix shows high risk HPV present 01/14/2018  . Supervision of high risk pregnancy, antepartum 01/08/2018  . Seizure disorder during pregnancy ([place]) 01/08/2018  . Advanced maternal age, primigravida 01/08/2018  . Bipolar 2 disorder ([place]) 01/08/2018  . Borderline personality disorder ([place]) 01/08/2018  . Major depression, chronic 01/08/2018  . PTSD (post-traumatic stress disorder) 01/08/2018  . OCD (obsessive compulsive disorder) 01/08/2018  . Asthma   . Seizure disorder ([place]) 11/06/2017  . Schizoaffective disorder, depressive type ([place]) 08/16/2017    Past Surgical History:  Procedure Laterality Date  . Brain Shunt  1981   "a few hours old"  . EYE SURGERY Bilateral 1985  . INTRAUTERINE DEVICE (IUD) INSERTION N/A 09/16/2018   Procedure: INTRAUTERINE DEVICE (IUD) INSERTION;  Surgeon: Lavonia Drafts, MD;  Location: [facility];  Service: Gynecology;  Laterality: N/A;  . PERINEUM REPAIR N/A 09/16/2018   Procedure: EPISIOTOMY  REVISION;  Surgeon: Lavonia Drafts, MD;  Location: [facility];  Service: Gynecology;  Laterality: N/A;  . [place]  . Tooth Removal     multiple     OB History    Gravida  2   Para  1   Term  1   Preterm  0   AB  1   Living  1     SAB  1   TAB  0   Ectopic  0   Multiple  0   Live Births  1           Family History  Problem Relation Age of Onset  . Heart attack  Mother   . Stroke Mother   . Liver cancer Mother   . Diabetes Mother   . Lung cancer Mother   . Alcoholism Father   . Sleep apnea Brother   . Depression Brother   . ADD / ADHD Brother   . Diabetes Maternal Aunt   . Diabetes Paternal Uncle   . Colon cancer Paternal Uncle   . Diabetes Maternal Grandmother   . Dementia Maternal Grandmother     Social History   Tobacco Use  . Smoking status: Current Every Day Smoker    Packs/day: 0.50    Years: 26.00    Pack years: 13.00    Types: Cigarettes  . Smokeless tobacco: Never Used  Vaping Use  . Vaping Use: Former  Substance Use Topics  . Alcohol use: Not Currently  . Drug use: Not Currently    Types: Marijuana    Comment:  last used in Jan 2019 due to "RA" per pt    Home Medications Prior to Admission medications   Medication Sig Start Date End Date Taking? Authorizing Provider  acetaminophen (TYLENOL) 325 MG tablet Take 650 mg by mouth every 6 (six) hours as needed for mild pain, fever or headache.   Yes [provider]  busPIRone (BUSPAR) 10 MG tablet Take 1 tablet (10 mg total) by mouth 3 (three) times daily. For anxiety 11/04/19  Yes Lindell Spar I, NP  butalbital-acetaminophen-caffeine (FIORICET) [phone] MG tablet Take 1-2 tablets by mouth every 6 (six) hours as needed for headache. 12/25/19 12/24/20 Yes Garvin Fila, MD  Ferrous Sulfate (IRON) 325 (65 Fe) MG TABS Take 1 tablet by mouth daily.   Yes [provider]  FLUoxetine (PROZAC) 20 MG capsule Take 1 capsule (20 mg total) by mouth daily. For depression 11/04/19  Yes Nwoko, Herbert Pun I, NP  gabapentin (NEURONTIN) 300 MG capsule Take 1 capsule (300 mg total) by mouth 2 (two) times daily. For agitation 11/04/19  Yes Lindell Spar I, NP  hydrOXYzine (ATARAX/VISTARIL) 25 MG tablet Take 1 tablet (25 mg total) by mouth 3 (three) times daily as needed for anxiety. 11/04/19  Yes Lindell Spar I, NP  levETIRAcetam (KEPPRA) 500 MG tablet Take 1 tablet (500 mg total) by  mouth 2 (two) times daily. For seizure disorder 11/04/19  Yes Lindell Spar I, NP  Multiple Vitamins-Minerals (WOMENS MULTIVITAMIN PO) Take 1 tablet by mouth daily.   Yes [provider]  traZODone (DESYREL) 50 MG tablet Take 1 tablet (50 mg total) by mouth at bedtime as needed for sleep. 11/04/19  Yes Lindell Spar I, NP  nicotine polacrilex (NICORETTE) 2 MG gum Take 1 each (2 mg total) by mouth as needed. (May buy from over the counter): For smoking cessation Patient not taking: Reported on 03/13/2020 11/04/19   Encarnacion Slates,  NP    Allergies    Aspartame and phenylalanine, Benadryl [diphenhydramine], Other, Scallops [shellfish allergy], Yellow jacket venom [bee venom], Pollen extract, Tegretol [carbamazepine], Adhesive [tape], and Latex  Review of Systems   Review of Systems Ten systems are reviewed and are negative for acute change except as noted in the HPI  Physical Exam Updated Vital Signs BP 112/70   Pulse 54   Temp 98.2 F (36.8 C) (Oral)   Resp 13   SpO2 97%   Physical Exam Constitutional:      General: He is not in acute distress.    Appearance: Normal appearance. He is well-developed. He is not ill-appearing or diaphoretic.  HENT:     Head: Normocephalic and atraumatic.  Eyes:     General: Vision grossly intact. Gaze aligned appropriately.     Pupils: Pupils are equal, round, and reactive to light.  Neck:     Trachea: Trachea and phonation normal.  Cardiovascular:     Rate and Rhythm: Normal rate and regular rhythm.     Pulses:          Dorsalis pedis pulses are 2+ on the right side and 2+ on the left side.  Pulmonary:     Effort: Pulmonary effort is normal. No respiratory distress.  Abdominal:     General: There is no distension.     Palpations: Abdomen is soft.     Tenderness: There is no abdominal tenderness. There is no guarding or rebound.  Musculoskeletal:        General: Normal range of motion.     Cervical back: Normal range of motion.     Right  lower leg: No edema.     Left lower leg: No edema.  Skin:    General: Skin is warm and dry.  Neurological:     Mental Status: He is alert.     GCS: GCS eye subscore is 4. GCS verbal subscore is 5. GCS motor subscore is 6.     Comments: Speech is clear and goal oriented, follows commands Major Cranial nerves without deficit, no facial droop Moves extremities without ataxia, coordination intact Dorsi/plantar flexion intact bilaterally.  Decreased right straight leg raise compared to left.  Patient denies sensation to any of the toes or to the right lower leg in stocking glove distributions.  Strong and equal grip strength.  Psychiatric:        Behavior: Behavior normal.     ED Results / Procedures / Treatments   Labs (all labs ordered are listed, but only abnormal results are displayed) Labs Reviewed  BASIC METABOLIC PANEL - Abnormal; Notable for the following components:      Result Value   CO2 19 (*)    Glucose, Bld 104 (*)    All other components within normal limits  CBC - Abnormal; Notable for the following components:   RBC 5.38 (*)    Hemoglobin 16.8 (*)    HCT 47.6 (*)    All other components within normal limits  URINALYSIS, ROUTINE W REFLEX MICROSCOPIC - Abnormal; Notable for the following components:   APPearance HAZY (*)    Hgb urine dipstick LARGE (*)    Leukocytes,Ua TRACE (*)    All other components within normal limits  MAGNESIUM  CBG MONITORING, ED  I-STAT BETA HCG BLOOD, ED ([facility], [facility], [facility] ONLY)    EKG EKG Interpretation  Date/Time:  Sunday March 13 2020 14:11:36 EDT Ventricular Rate:  56 PR Interval:    QRS Duration: 95  QT Interval:  426 QTC Calculation: 412 R Axis:   72 Text Interpretation: Sinus rhythm Anteroseptal infarct, age indeterminate Confirmed by Dene Gentry [phone]) on 03/13/2020 3:35:31 PM   Radiology No results found.  Procedures Procedures (including critical care time)  Medications Ordered in ED Medications  sodium chloride  flush (NS) 0.9 % injection 3 mL (3 mLs Intravenous Not Given 03/13/20 1355)    ED Course  I have reviewed the triage vital signs and the nursing notes.  Pertinent labs & imaging results that were available during my care of the patient were reviewed by me and considered in my medical decision making (see chart for details).    MDM Rules/Calculators/A&P                         Additional history obtained from: 1. Nursing notes from this visit. ------------------- I ordered, reviewed and interpreted labs which include: CBC shows no leukocytosis to suggest infection.  Hemoglobin of 16.8 may suggest hemoconcentration. BMP shows no emergent Electra derangement, AKI or gap. Urinalysis not suggestive of UTI. Pregnancy test negative. Magnesium within normal limits.  EKG: Sinus rhythm Anteroseptal infarct, age indeterminate Confirmed by Dene Gentry [phone]) on 03/13/2020 3:35:31 PM  Patient a strong equal pedal pulses, capillary fill intact to all toes.  No evidence of DVT.  Compartments soft.  No evidence of traumatic injury.  She has no symptoms suggestive of cauda equina.  No pain of the back.  No pulsatile mass suggesting AAA.  Will obtain x-rays of the lumbar spine and hip.  Cranial nerves intact, strong and equal strength of upper extremities.  Possible sciatica as cause of symptoms today, doubt CVA at this time.  Discussed case with Dr. Francia Greaves who agrees with work-up. ---- Care handoff given to Arlean Hopping, PA-C at shift change.  Plan of care is to follow-up on x-rays of the lumbar spine and pelvis/right hip.  Disposition per oncoming team.    Note: Portions of this report may have been transcribed using voice recognition software. Every effort was made to ensure accuracy; however, inadvertent computerized transcription errors may still be present. Final Clinical Impression(s) / ED Diagnoses Final diagnoses:  None    Rx / DC Orders ED Discharge Orders    None       Gari Crown 03/13/20 1553    Valarie Merino, MD 03/17/20 [phone]

## 2020-03-13 NOTE — ED Triage Notes (Signed)
Pt here pov with increase fatigue x2 weeks sleeping.and  states that she has been having trouble with her right leg feels like ice is going thru it also states that it hurts to walk on it. Pts leg is warm and pulses palatable.

## 2020-03-13 NOTE — ED Provider Notes (Signed)
Amanda [name] is a 39 y.o. adult, presenting to the ED with right lower extremity pain and numbness for the last 2 weeks. During my interview, it seems as though patient clarifies this sensation more as tingling like pins-and-needles. She also endorses overall fatigue and nausea.  HPI from [place], PA-C: "Amanda [name] is a 39 y.o. adult presents today for right lower extremity pain and numbness onset 2 weeks ago.  Patient reports right leg has been completely numb for 2 weeks unclear cause.  Reports that leg will occasionally feel as if it is covered in ice however when patient significant other feels the extremity it is warm.  Denies any color change or swelling.  Additional symptom of increased fatigue over the past 2 weeks and sleeping more often than normal.  Patient attributes symptoms to hip dysplasia.  Denies fall/injury, fever/chills, chest pain/shortness of breath, cough/mopped assist, abdominal pain, nausea/vomiting, diarrhea, extremity swelling/color change, saddle or paresthesias, bowel/bladder incontinence, urinary retention, IV drug use or any additional concerns."  Past Medical History:  Diagnosis Date  . Anxiety   . Asthma   . Asthma due to environmental allergies   . Asthma due to seasonal allergies   . Bipolar 1 disorder ([place])   . Borderline personality disorder ([place])   . Brain bleed ([place])   . Chronic post-traumatic stress disorder (PTSD)    complex chronic with psychotic features and self harm behaviors  . Complication of anesthesia   . Constipation   . Dander (animal) allergy   . Hearing loss    right ear  . Heart murmur    denies seeing a cardiologist  . Hip dysplasia   . Hypertension    Gestional   . Major depression, chronic   . Mood disorder ([place])   . OCD (obsessive compulsive disorder)   . Pneumonia   . PONV (postoperative nausea and vomiting)   . PTSD (post-traumatic stress disorder)   . RA (rheumatoid arthritis) ([place])   . Rheumatoid  arthritis ([place])   . Schizophrenia ([place])    "I think that is wrong"  . Seizure disorder ([place])   . Suicidal ideations   . Suicide attempt [facility])     Physical Exam  BP 112/70   Pulse 54   Temp 98.2 F (36.8 C) (Oral)   Resp 13   SpO2 97%   Physical Exam Vitals and nursing note reviewed.  Constitutional:      General: He is not in acute distress.    Appearance: He is well-developed. He is not diaphoretic.  HENT:     Head: Normocephalic and atraumatic.     Mouth/Throat:     Mouth: Mucous membranes are moist.     Pharynx: Oropharynx is clear.  Eyes:     Conjunctiva/sclera: Conjunctivae normal.  Cardiovascular:     Rate and Rhythm: Normal rate and regular rhythm.     Pulses: Normal pulses.          Radial pulses are 2+ on the right side and 2+ on the left side.       Dorsalis pedis pulses are 2+ on the right side and 2+ on the left side.       Posterior tibial pulses are 2+ on the right side and 2+ on the left side.     Heart sounds: Normal heart sounds.     Comments: Tactile temperature in the extremities appropriate and equal bilaterally. Pulmonary:     Effort: Pulmonary effort is normal. No respiratory distress.  Breath sounds: Normal breath sounds.  Abdominal:     Palpations: Abdomen is soft.     Tenderness: There is no abdominal tenderness. There is no guarding.  Musculoskeletal:     Cervical back: Neck supple.     Right lower leg: No edema.     Left lower leg: No edema.     Comments: Vague, generalized tenderness across the lower back.  No swelling, wounds, step-off, color abnormalities.  Lymphadenopathy:     Cervical: No cervical adenopathy.  Skin:    General: Skin is warm and dry.     Capillary Refill: Capillary refill takes less than 2 seconds.  Neurological:     Mental Status: He is alert.     Deep Tendon Reflexes:     Reflex Scores:      Patellar reflexes are 2+ on the right side and 2+ on the left side.    Comments: Sensation grossly intact to light  touch in the lower extremities bilaterally. No saddle anesthesias. Strength 5/5 in the bilateral lower extremities. No noted gait deficit. Coordination intact.  Psychiatric:        Mood and Affect: Mood and affect normal.        Speech: Speech normal.        Behavior: Behavior normal.     ED Course/Procedures    Procedures   Abnormal Labs Reviewed  BASIC METABOLIC PANEL - Abnormal; Notable for the following components:      Result Value   CO2 19 (*)    Glucose, Bld 104 (*)    All other components within normal limits  CBC - Abnormal; Notable for the following components:   RBC 5.38 (*)    Hemoglobin 16.8 (*)    HCT 47.6 (*)    All other components within normal limits  URINALYSIS, ROUTINE W REFLEX MICROSCOPIC - Abnormal; Notable for the following components:   APPearance HAZY (*)    Hgb urine dipstick LARGE (*)    Leukocytes,Ua TRACE (*)    All other components within normal limits   DG Lumbar Spine Complete  Result Date: 03/13/2020 CLINICAL DATA:  Right leg numbness EXAM: LUMBAR SPINE - COMPLETE 4+ VIEW COMPARISON:  None. FINDINGS: There is normal lumbar lordosis. No fracture or listhesis of the lumbar spine. Minimal intervertebral disc space narrowing and endplate remodeling at T2-6 in keeping with changes of mild degenerative disc disease. Schmorl's nodes noted within L4. Vertebral body heights and remaining intervertebral disc heights have been preserved. The paraspinal soft tissues are unremarkable. Intrauterine device in expected position within the mid pelvis. IMPRESSION: Minimal degenerative change L2-3. Electronically Signed   By: Fidela Salisbury MD   On: 03/13/2020 16:24   DG Hip Unilat W or Wo Pelvis 2-3 Views Right  Result Date: 03/13/2020 CLINICAL DATA:  RIGHT lower extremity pain and numbness beginning 2 weeks ago EXAM: DG HIP (WITH OR WITHOUT PELVIS) 2-3V RIGHT COMPARISON:  None FINDINGS: Osseous mineralization normal. Hip and SI joint spaces preserved. No acute  fracture, dislocation, or bone destruction. IUD projects over pelvis. Slightly prominent stool in rectum. IMPRESSION: No acute osseous abnormalities. Electronically Signed   By: Lavonia Dana M.D.   On: 03/13/2020 16:24     EKG Interpretation  Date/Time:  Sunday March 13 2020 14:11:36 EDT Ventricular Rate:  56 PR Interval:    QRS Duration: 95 QT Interval:  426 QTC Calculation: 412 R Axis:   72 Text Interpretation: Sinus rhythm Anteroseptal infarct, age indeterminate Confirmed by Dene Gentry [phone]) on  03/13/2020 3:35:31 PM      MDM       Patient care handoff report received from [facility], [place]. Plan: Follow-up on patient's pending x-rays.  Patient presents with lower back and right lower extremity pain and tingling. No focal neurologic deficits on my exam.  I personally reviewed and interpreted the patient's labs and imaging studies. Lab work overall reassuring.  Slightly decreased CO2 may be indicative of some dehydration.  PCP follow-up for symptoms of fatigue.  Orthopedic follow-up for back pain and extremity symptoms.  The patient was given instructions for home care as well as return precautions. Patient voices understanding of these instructions, accepts the plan, and is comfortable with discharge.  Vitals:   03/13/20 1108 03/13/20 1306 03/13/20 1432  BP: 118/74 120/85 112/70  Pulse: 63 58 54  Resp: 16 18 13   Temp: 98.2 F (36.8 C)    TempSrc: Oral    SpO2: 100% 99% 97%      Layla Maw 03/13/20 1815    Valarie Merino, MD 03/17/20 [phone]

## 2020-03-13 NOTE — Discharge Instructions (Addendum)
Ortho  Zofran  Prednisone  Robaxin    Take it easy, but do not lay around too much as this may make any stiffness worse.  Antiinflammatory medications: Take 600 mg of ibuprofen every 6 hours or 440 mg (over the counter dose) to 500 mg (prescription dose) of naproxen every 12 hours for the next 3 days. After this time, these medications may be used as needed for pain. Take these medications with food to avoid upset stomach. Choose only one of these medications, do not take them together. Acetaminophen (generic for Tylenol): Should you continue to have additional pain while taking the ibuprofen or naproxen, you may add in acetaminophen as needed. Your daily total maximum amount of acetaminophen from all sources should be limited to 4000mg /day for persons without liver problems, or 2000mg /day for those with liver problems. Methocarbamol: Methocarbamol (generic for Robaxin) is a muscle relaxer and can help relieve stiff muscles or muscle spasms.  Do not drive or perform other dangerous activities while taking this medication as it can cause drowsiness as well as changes in reaction time and judgement. Lidocaine patches: These are available via either prescription or over-the-counter. The over-the-counter option may be more economical one and are likely just as effective. There are multiple over-the-counter brands, such as Salonpas. Prednisone: Take the prednisone, as prescribed, until finished. If you are a diabetic, please know prednisone can raise your blood sugar temporarily. Nausea/vomiting: Use the ondansetron (generic for Zofran) for nausea or vomiting.  This medication may not prevent all vomiting or nausea, but can help facilitate better hydration. Things that can help with nausea/vomiting also include peppermint/menthol candies, vitamin B12, and ginger. Ice: May apply ice to the area over the next 24 hours for 15 minutes at a time to reduce pain, inflammation, and swelling, if  present. Exercises: Be sure to perform the attached exercises starting with three times a week and working up to performing them daily. This is an essential part of preventing long term problems.  Follow up: Follow up with a primary care provider for any future management of these complaints. Be sure to follow up within 7-10 days.  Also recommend follow-up with the orthopedic specialist.  Call to make an appointment. Return: Return to the ED should symptoms worsen.  For prescription assistance, may try using prescription discount sites or apps, such as goodrx.com

## 2020-03-29 IMAGING — US US OB COMP LESS 14 WK
1 series · 14 of 28 positions shown · non-contrast
Comparison: None for this gestation

CLINICAL DATA: Lower back pain and pelvic cramping since this
morning, pregnant, quantitative beta HCG = 106,567

EXAM:
OBSTETRIC <14 WK ULTRASOUND
TECHNIQUE: Transabdominal ultrasound was performed for evaluation of the
gestation as well as the maternal uterus and adnexal regions.

[Series 1: us ob comp less 14 wk · 14 of 36 slices shown]
[im 2/36]
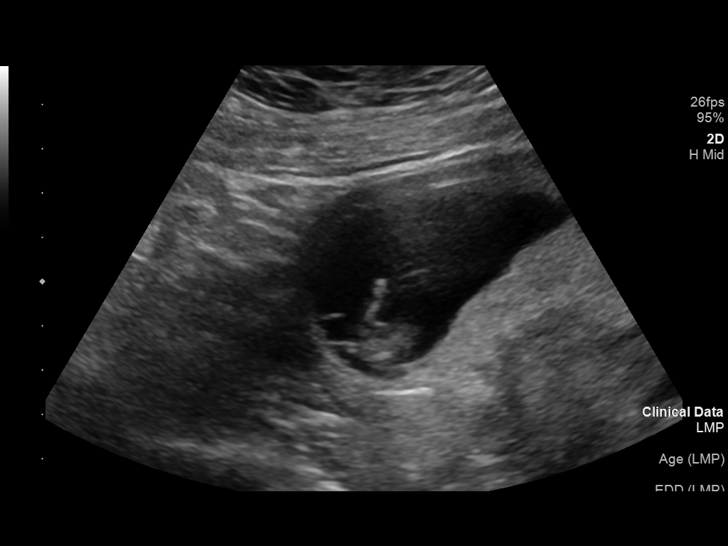
[im 4/36]
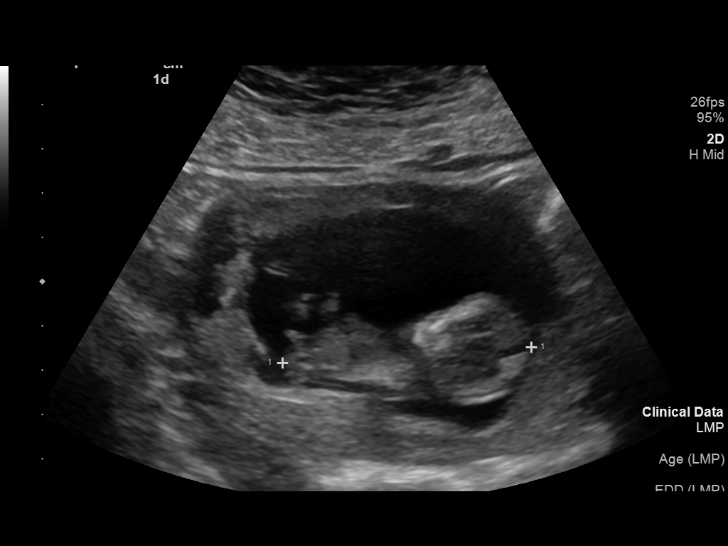
[im 7/36]
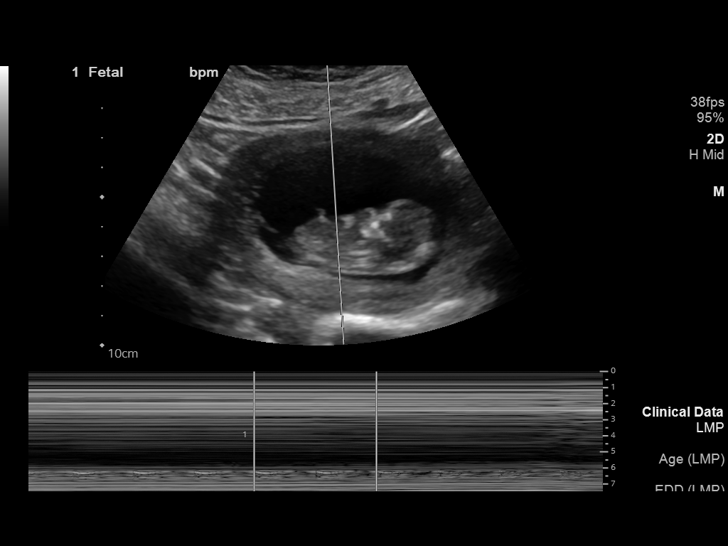
[im 10/36]
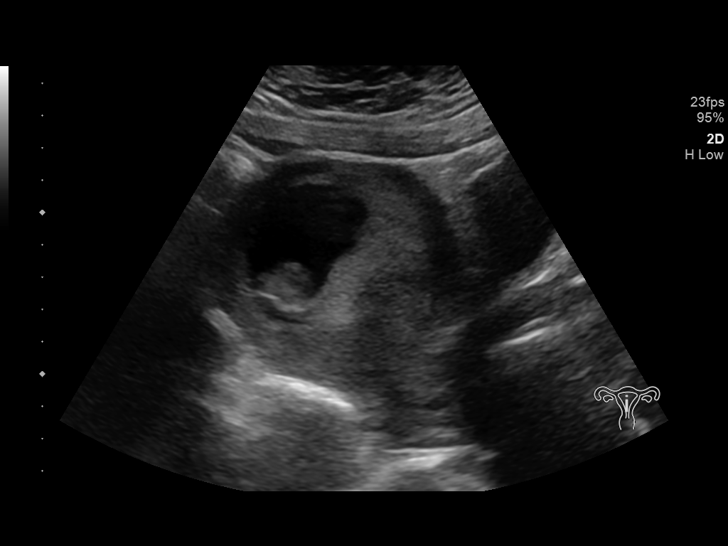
[im 12/36]
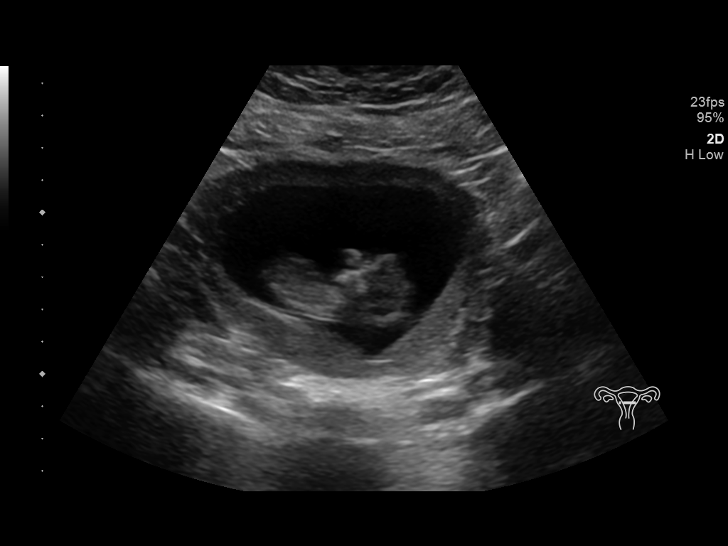
[im 15/36]
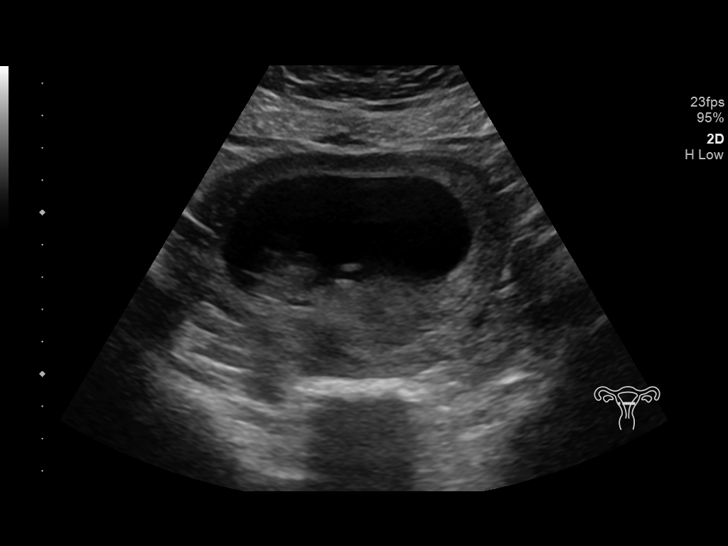
[im 17/36]
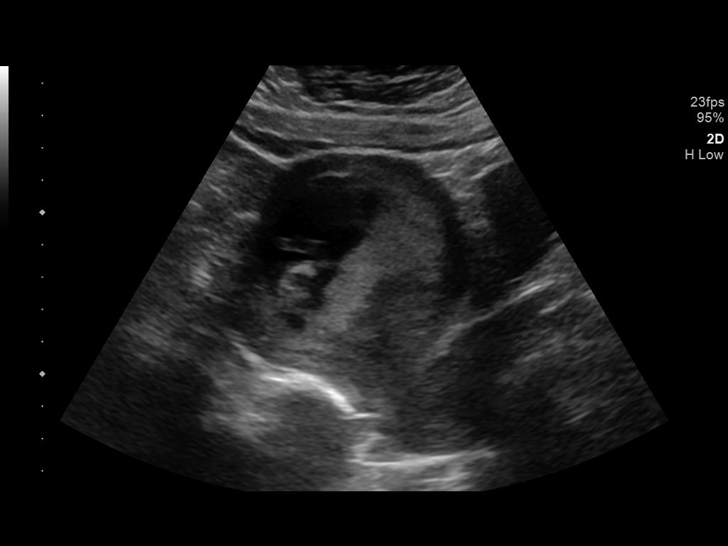
[im 20/36]
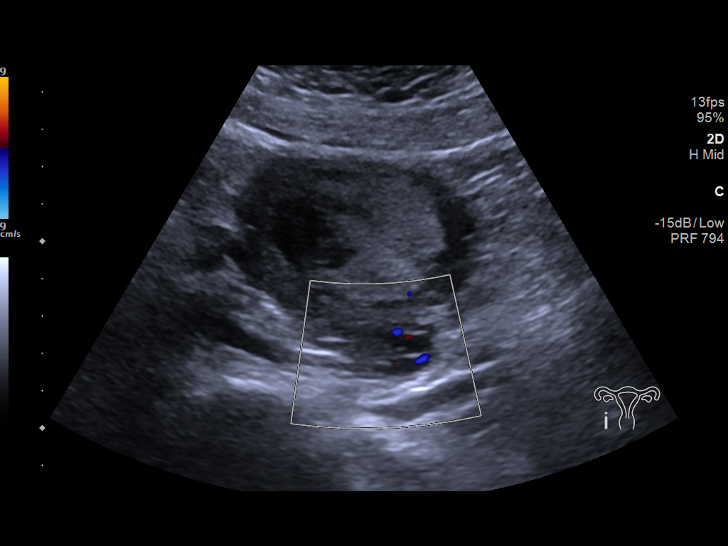
[im 23/36]
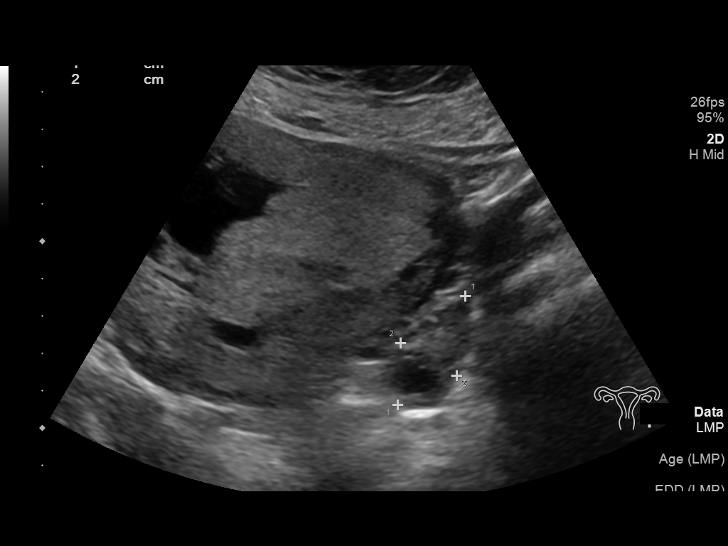
[im 25/36]
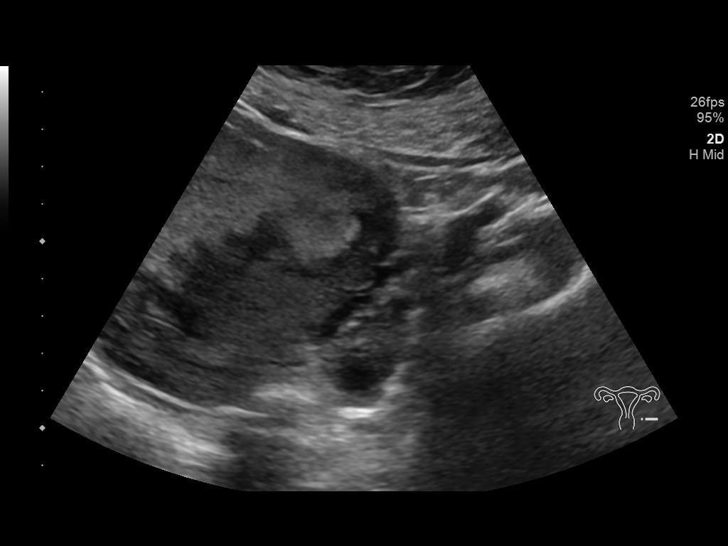
[im 28/36]
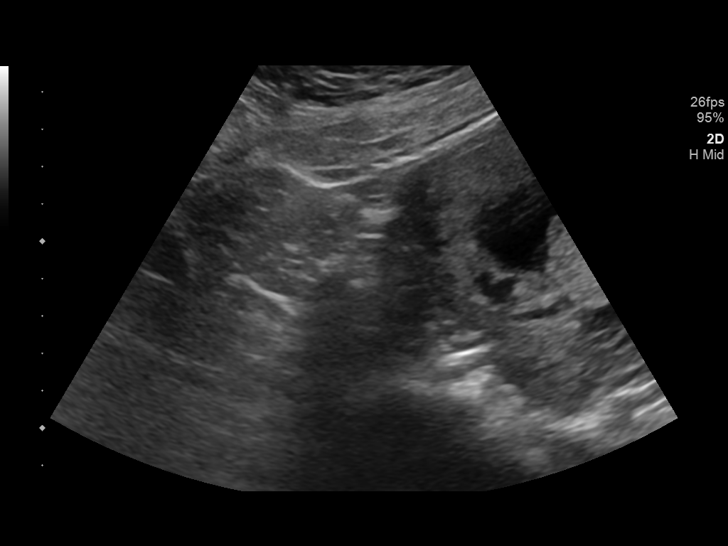
[im 30/36]
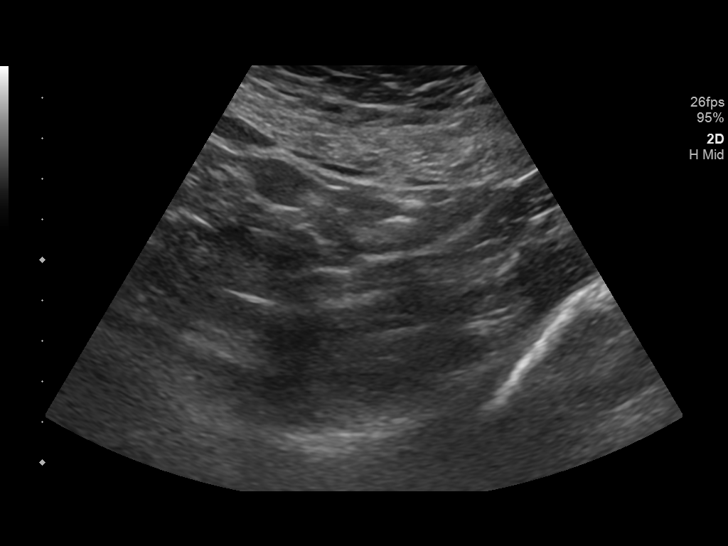
[im 33/36]
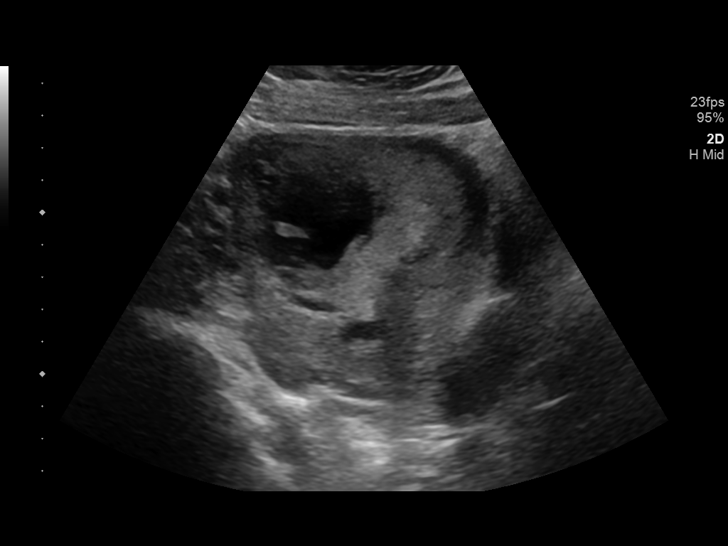
[im 36/36]
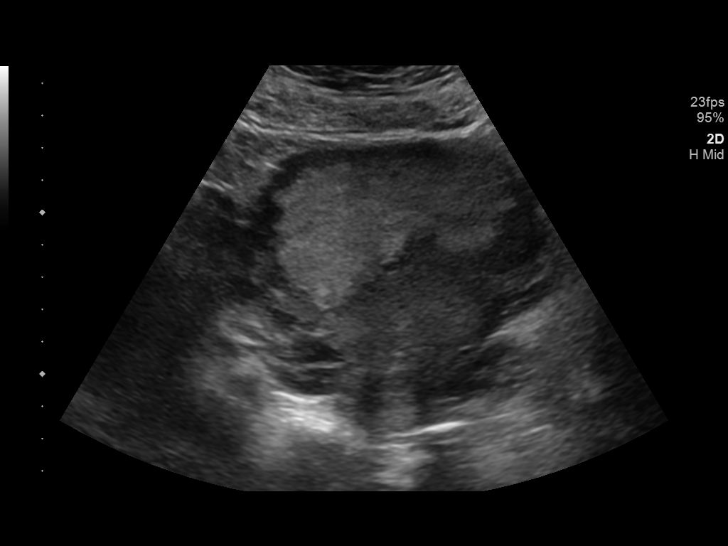

[14 of 28 positions shown; findings below may reference images not displayed]

FINDINGS: Intrauterine gestational sac: Present, single

Yolk sac:  Present

Embryo:  Present

Cardiac Activity: Present

Heart Rate: 167 bpm

CRL:   54.2 mm   12 w 0 d                  US EDC: 08/10/2018

Subchorionic hemorrhage:  Tiny subchorionic hemorrhage.

Maternal uterus/adnexae:

RIGHT ovary normal size and morphology, 3.0 x 2.0 x 1.3 cm.

LEFT ovary normal size and morphology, 3.4 x 1.7 x 2.2 cm.

No free pelvic fluid or adnexal masses.
IMPRESSION: Single live intrauterine gestation at 12 weeks 0 days EGA by
crown-rump length.

Tiny subchronic hemorrhage.

## 2020-07-01 ENCOUNTER — Encounter (HOSPITAL_COMMUNITY): Payer: Self-pay | Admitting: Licensed Clinical Social Worker

## 2020-07-01 ENCOUNTER — Other Ambulatory Visit: Payer: Self-pay

## 2020-07-01 ENCOUNTER — Ambulatory Visit (INDEPENDENT_AMBULATORY_CARE_PROVIDER_SITE_OTHER): Payer: Medicare (Managed Care) | Admitting: Licensed Clinical Social Worker

## 2020-07-01 DIAGNOSIS — F3162 Bipolar disorder, current episode mixed, moderate: Secondary | ICD-10-CM | POA: Diagnosis not present

## 2020-07-01 DIAGNOSIS — F4310 Post-traumatic stress disorder, unspecified: Secondary | ICD-10-CM

## 2020-07-01 DIAGNOSIS — F332 Major depressive disorder, recurrent severe without psychotic features: Secondary | ICD-10-CM

## 2020-07-01 DIAGNOSIS — F411 Generalized anxiety disorder: Secondary | ICD-10-CM | POA: Insufficient documentation
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
## 2020-07-07 ENCOUNTER — Emergency Department (HOSPITAL_COMMUNITY)
Admission: EM | Admit: 2020-07-07 | Discharge: 2020-07-07 | Disposition: A | Discharge status: Home / Self Care | Payer: Medicare (Managed Care) | Attending: Emergency Medicine | Admitting: Emergency Medicine

## 2020-07-07 DIAGNOSIS — Z91040 Latex allergy status: Secondary | ICD-10-CM | POA: Insufficient documentation

## 2020-07-07 DIAGNOSIS — F17210 Nicotine dependence, cigarettes, uncomplicated: Secondary | ICD-10-CM | POA: Diagnosis not present

## 2020-07-07 DIAGNOSIS — J45909 Unspecified asthma, uncomplicated: Secondary | ICD-10-CM | POA: Diagnosis not present

## 2020-07-07 DIAGNOSIS — Z760 Encounter for issue of repeat prescription: Secondary | ICD-10-CM | POA: Diagnosis present

## 2020-07-07 DIAGNOSIS — Z79899 Other long term (current) drug therapy: Secondary | ICD-10-CM | POA: Insufficient documentation

## 2020-07-07 LAB — CBC WITH DIFFERENTIAL/PLATELET
Abs Immature Granulocytes: 0.03 10*3/uL (ref 0.00–0.07)
Basophils Absolute: 0.1 10*3/uL (ref 0.0–0.1)
Basophils Relative: 1 %
Eosinophils Absolute: 0.1 10*3/uL (ref 0.0–0.5)
Eosinophils Relative: 1 %
HCT: 45.6 % (ref 36.0–46.0)
Hemoglobin: 15.6 g/dL — ABNORMAL HIGH (ref 12.0–15.0)
Immature Granulocytes: 0 %
Lymphocytes Relative: 26 %
Lymphs Abs: 2.7 10*3/uL (ref 0.7–4.0)
MCH: 31.2 pg (ref 26.0–34.0)
MCHC: 34.2 g/dL (ref 30.0–36.0)
MCV: 91.2 fL (ref 80.0–100.0)
Monocytes Absolute: 0.7 10*3/uL (ref 0.1–1.0)
Monocytes Relative: 7 %
Neutro Abs: 7.1 10*3/uL (ref 1.7–7.7)
Neutrophils Relative %: 65 %
Platelets: 279 10*3/uL (ref 150–400)
RBC: 5 MIL/uL (ref 3.87–5.11)
RDW: 12.8 % (ref 11.5–15.5)
WBC: 10.7 10*3/uL — ABNORMAL HIGH (ref 4.0–10.5)
nRBC: 0 % (ref 0.0–0.2)

## 2020-07-07 LAB — BASIC METABOLIC PANEL
Anion gap: 6 (ref 5–15)
BUN: 17 mg/dL (ref 6–20)
CO2: 26 mmol/L (ref 22–32)
Calcium: 9.4 mg/dL (ref 8.9–10.3)
Chloride: 107 mmol/L (ref 98–111)
Creatinine, Ser: 0.71 mg/dL (ref 0.44–1.00)
GFR, Estimated: >60 mL/min (ref >60)
Glucose, Bld: 92 mg/dL (ref 70–99)
Potassium: 4.2 mmol/L (ref 3.5–5.1)
Sodium: 139 mmol/L (ref 135–145)

## 2020-07-07 MED ORDER — LEVETIRACETAM 500 MG PO TABS
500.0000 mg | ORAL_TABLET | Freq: Two times a day (BID) | ORAL | 2 refills | Status: DC
Start: 2020-07-07 — End: 2020-12-05

## 2020-07-07 MED ORDER — LEVETIRACETAM 500 MG PO TABS
500.0000 mg | ORAL_TABLET | Freq: Once | ORAL | Status: AC
  Administered 2020-07-07: 500 mg via ORAL
  Filled 2020-07-07: qty 1

## 2020-07-07 NOTE — Discharge Instructions (Addendum)
Please return for any problem.  °

## 2020-07-07 NOTE — ED Notes (Signed)
Provider at bedside

## 2020-07-07 NOTE — ED Notes (Signed)
Pt resting in bed. No involuntary movements noted. Family at bedside.

## 2020-07-07 NOTE — ED Provider Notes (Signed)
[facility] DEPT Provider Note   CSN: [id number] Arrival date & time: 07/07/20  1123     History Chief Complaint  Patient presents with  . Seizures    Amanda [name] is a 39 y.o. adult.  39 year old transgender female presents for evaluation.  Patient reports longstanding history of full body "twitching".  Patient reports that previously they have been taking 500 mg of Keppra twice daily for treatment of this.  Patient reports that they are currently out other Vandiver.  They are requesting refill on Keppra.  Patient denies other complaint.  Patient denies seizure.  Patient denies loss of conscious.  Patient denies fever.  Patient denies chest pain or shortness of breath.  Patient appears to be comfortable and appropriately interactive during my evaluation.  The history is provided by the patient and medical records.  Medication Refill Medications/supplies requested:  [employer] Reason for request:  Medications ran out Medications taken before: yes - see home medications        Past Medical History:  Diagnosis Date  . Anxiety   . Asthma   . Asthma due to environmental allergies   . Asthma due to seasonal allergies   . Bipolar 1 disorder ([place])   . Borderline personality disorder ([place])   . Brain bleed ([place])   . Chronic post-traumatic stress disorder (PTSD)    complex chronic with psychotic features and self harm behaviors  . Complication of anesthesia   . Constipation   . Dander (animal) allergy   . Hearing loss    right ear  . Heart murmur    denies seeing a cardiologist  . Hip dysplasia   . Hypertension    Gestional   . Major depression, chronic   . Mood disorder ([place])   . OCD (obsessive compulsive disorder)   . Pneumonia   . PONV (postoperative nausea and vomiting)   . PTSD (post-traumatic stress disorder)   . RA (rheumatoid arthritis) ([place])   . Rheumatoid arthritis ([place])   . Schizophrenia ([place])    "I think that is wrong"  . Seizure  disorder ([place])   . Suicidal ideations   . Suicide attempt [facility])     Patient Active Problem List   Diagnosis Date Noted  . GAD (generalized anxiety disorder) 07/01/2020  . Bipolar 1 disorder, mixed, moderate ([place]) 07/01/2020  . MDD (major depressive disorder), recurrent severe, without psychosis ([place]) 11/01/2019  . Major depressive disorder, recurrent episode, severe ([place]) 11/13/2018  . Bipolar 1 disorder, depressed ([place]) 11/10/2018  . Seizures ([place]) 09/23/2018  . Disruption of episiotomy wound in the puerperium 09/04/2018  . Postpartum care following vaginal delivery 07/28/2018  . Encounter for IUD insertion 07/24/2018  . Gestational hypertension 06/18/2018  . Pap smear of cervix shows high risk HPV present 01/14/2018  . Supervision of high risk pregnancy, antepartum 01/08/2018  . Seizure disorder during pregnancy ([place]) 01/08/2018  . Advanced maternal age, primigravida 01/08/2018  . Bipolar 2 disorder ([place]) 01/08/2018  . Borderline personality disorder ([place]) 01/08/2018  . Major depression, chronic 01/08/2018  . PTSD (post-traumatic stress disorder) 01/08/2018  . OCD (obsessive compulsive disorder) 01/08/2018  . Asthma   . Seizure disorder ([place]) 11/06/2017  . Schizoaffective disorder, depressive type ([place]) 08/16/2017    Past Surgical History:  Procedure Laterality Date  . Brain Shunt  1981   "a few hours old"  . EYE SURGERY Bilateral 1985  . INTRAUTERINE DEVICE (IUD) INSERTION N/A 09/16/2018   Procedure: INTRAUTERINE DEVICE (IUD) INSERTION;  Surgeon: Lavonia Drafts, MD;  Location: [facility];  Service: Gynecology;  Laterality: N/A;  . PERINEUM REPAIR N/A 09/16/2018   Procedure: EPISIOTOMY REVISION;  Surgeon: Lavonia Drafts, MD;  Location: [facility];  Service: Gynecology;  Laterality: N/A;  . [place]  . Tooth Removal     multiple     OB History    Gravida  2   Para  1   Term  1   Preterm  0   AB  1   Living  1     SAB  1   TAB  0    Ectopic  0   Multiple  0   Live Births  1           Family History  Problem Relation Age of Onset  . Heart attack Mother   . Stroke Mother   . Liver cancer Mother   . Diabetes Mother   . Lung cancer Mother   . Alcoholism Father   . Sleep apnea Brother   . Depression Brother   . ADD / ADHD Brother   . Diabetes Maternal Aunt   . Diabetes Paternal Uncle   . Colon cancer Paternal Uncle   . Diabetes Maternal Grandmother   . Dementia Maternal Grandmother     Social History   Tobacco Use  . Smoking status: Current Every Day Smoker    Packs/day: 0.40    Years: 26.00    Pack years: 10.40    Types: Cigarettes  . Smokeless tobacco: Never Used  Vaping Use  . Vaping Use: Former  Substance Use Topics  . Alcohol use: Not Currently  . 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 Authorizing Provider  acetaminophen (TYLENOL) 325 MG tablet Take 650 mg by mouth every 6 (six) hours as needed for mild pain, fever or headache.    [provider]  busPIRone (BUSPAR) 10 MG tablet Take 1 tablet (10 mg total) by mouth 3 (three) times daily. For anxiety 11/04/19   Lindell Spar I, NP  butalbital-acetaminophen-caffeine (FIORICET) [phone] MG tablet Take 1-2 tablets by mouth every 6 (six) hours as needed for headache. 12/25/19 12/24/20  Garvin Fila, MD  Ferrous Sulfate (IRON) 325 (65 Fe) MG TABS Take 1 tablet by mouth daily.    [provider]  FLUoxetine (PROZAC) 20 MG capsule Take 1 capsule (20 mg total) by mouth daily. For depression 11/04/19   Lindell Spar I, NP  gabapentin (NEURONTIN) 300 MG capsule Take 1 capsule (300 mg total) by mouth 2 (two) times daily. For agitation 11/04/19   Lindell Spar I, NP  hydrOXYzine (ATARAX/VISTARIL) 25 MG tablet Take 1 tablet (25 mg total) by mouth 3 (three) times daily as needed for anxiety. 11/04/19   Lindell Spar I, NP  levETIRAcetam (KEPPRA) 500 MG tablet Take 1 tablet (500 mg total) by mouth 2 (two) times daily. For seizure disorder 11/04/19   Lindell Spar I, NP  lidocaine (LIDODERM) 5 % Place 1 patch onto the skin daily. Remove & Discard patch within 12 hours or as directed by MD 03/13/20   Lorayne Bender, PA-C  methocarbamol (ROBAXIN) 750 MG tablet Take 1 tablet (750 mg total) by mouth 2 (two) times daily as needed for muscle spasms (or muscle tightness). 03/13/20   Joy, Helane Gunther, PA-C  Multiple Vitamins-Minerals (WOMENS MULTIVITAMIN PO) Take 1 tablet by mouth daily.    [provider]  nicotine polacrilex (NICORETTE) 2 MG gum Take 1 each (2 mg total) by mouth as needed. (May buy from over the counter): For smoking cessation Patient not taking: Reported on 03/13/2020 11/04/19   Lindell Spar I, NP  ondansetron (ZOFRAN ODT) 4 MG disintegrating tablet Take 1 tablet (4 mg total) by mouth every 8 (eight) hours as needed for nausea or vomiting. 03/13/20   Joy, Shawn C, PA-C  predniSONE (STERAPRED UNI-PAK 21 TAB) 10 MG (21) TBPK tablet Take 6 tabs (60mg ) day 1, 5 tabs (50mg ) day 2, 4 tabs (40mg ) day 3, 3 tabs (30mg ) day 4, 2 tabs (20mg ) day 5, and 1 tab (10mg ) day 6. 03/13/20   Joy, Shawn C, PA-C  traZODone (DESYREL) 50 MG tablet Take 1 tablet (50 mg total) by mouth at bedtime as needed for sleep. 11/04/19   Lindell Spar I, NP    Allergies    Aspartame and phenylalanine, Benadryl [diphenhydramine], Other, Scallops [shellfish allergy], Yellow jacket venom [bee venom], Pollen extract, Tegretol [carbamazepine], Adhesive [tape], and Latex  Review of Systems   Review of Systems  Neurological: Positive for tremors.  All other systems reviewed and are negative.   Physical Exam Updated Vital Signs BP (!) 102/43 (BP Location: Right Arm)   Pulse 61   Temp 98.5 F (36.9 C) (Oral)   Resp 16   SpO2 97%   Physical Exam Vitals and nursing note reviewed.  Constitutional:      General: He is not in acute  distress.    Appearance: He is well-developed.  HENT:     Head: Normocephalic and atraumatic.  Eyes:     Conjunctiva/sclera: Conjunctivae normal.     Pupils: Pupils are equal, round, and reactive to light.  Cardiovascular:     Rate and Rhythm: Normal rate and regular rhythm.     Heart sounds: Normal heart sounds.  Pulmonary:     Effort: Pulmonary effort is normal. No respiratory distress.     Breath sounds: Normal breath sounds.  Abdominal:     General: There is no distension.     Palpations: Abdomen is soft.     Tenderness: There is no abdominal tenderness.  Musculoskeletal:        General: No deformity. Normal range of motion.     Cervical back: Normal range of motion and neck supple.  Skin:    General: Skin is warm and dry.  Neurological:     General: No focal deficit present.     Mental Status: He is alert and oriented to person, place, and time. Mental status is at baseline.     Cranial Nerves: No cranial nerve deficit.     Sensory: No sensory deficit.     Motor: No weakness.     Coordination: Coordination normal.     Comments: Alert and oriented x4, no acute distress, normal speech, no facial droop, 5 out of 5 strength in all extremities  Patient does have intermittent spastic movement of arms, legs, and head.  The spastic movements are distractible.  With conversation for distraction the patient's movements hold on their own.  Patient without altered level of consciousness.     ED Results / Procedures / Treatments   Labs (all labs ordered are listed, but only abnormal results are displayed) Labs Reviewed  CBC WITH DIFFERENTIAL/PLATELET - Abnormal; Notable for the following components:      Result Value  WBC 10.7 (*)    Hemoglobin 15.6 (*)    All other components within normal limits  BASIC METABOLIC PANEL    EKG None  Radiology No results found.  Procedures Procedures (including critical care time)  Medications Ordered in ED Medications  levETIRAcetam  (KEPPRA) tablet 500 mg (500 mg Oral Given 07/07/20 1152)    ED Course  I have reviewed the triage vital signs and the nursing notes.  Pertinent labs & imaging results that were available during my care of the patient were reviewed by me and considered in my medical decision making (see chart for details).    MDM Rules/Calculators/A&P                          MDM  Screen complete  Amanda [name] was evaluated in Emergency Department on 07/07/2020 for the symptoms described in the history of present illness. He was evaluated in the context of the global COVID-19 pandemic, which necessitated consideration that the patient might be at risk for infection with the SARS-CoV-2 virus that causes COVID-19. Institutional protocols and algorithms that pertain to the evaluation of patients at risk for COVID-19 are in a state of rapid change based on information released by regulatory bodies including the CDC and federal and state organizations. These policies and algorithms were followed during the patient's care in the ED.   Patient is presenting requesting refill on Keppra.  Patient also requested some basic screening labs.  Screening labs are without significant abnormality.  Patient's Keppra is refilled.  Of note, patient's "twitching" resolved completely approximately 15 minutes after taking oral Keppra dose.  Appears to be appropriate for discharge.  Importance of close follow-up is stressed.  Strict return precautions given and understood.  Final Clinical Impression(s) / ED Diagnoses Final diagnoses:  Medication refill    Rx / DC Orders ED Discharge Orders         Ordered    levETIRAcetam (KEPPRA) 500 MG tablet  2 times daily        07/07/20 1251           Valarie Merino, MD 07/07/20 1252

## 2020-07-07 NOTE — ED Triage Notes (Signed)
Presents via EMS with c/o involuntary movements of head, neck and shoudlers. Pt does have hx of seizures but hasnt been able to take her [place] since June.

## 2020-07-11 ENCOUNTER — Other Ambulatory Visit: Payer: Self-pay

## 2020-07-11 ENCOUNTER — Other Ambulatory Visit (HOSPITAL_COMMUNITY): Payer: Self-pay | Admitting: Psychiatry

## 2020-07-11 ENCOUNTER — Ambulatory Visit (INDEPENDENT_AMBULATORY_CARE_PROVIDER_SITE_OTHER): Payer: Medicare (Managed Care) | Admitting: Psychiatry

## 2020-07-11 ENCOUNTER — Encounter (HOSPITAL_COMMUNITY): Payer: Self-pay | Admitting: Psychiatry

## 2020-07-11 DIAGNOSIS — F4310 Post-traumatic stress disorder, unspecified: Secondary | ICD-10-CM | POA: Diagnosis not present

## 2020-07-11 DIAGNOSIS — F411 Generalized anxiety disorder: Secondary | ICD-10-CM

## 2020-07-11 DIAGNOSIS — F319 Bipolar disorder, unspecified: Secondary | ICD-10-CM | POA: Diagnosis not present

## 2020-07-11 MED ORDER — PRAZOSIN HCL 1 MG PO CAPS: 1.0000 mg | ORAL_CAPSULE | Freq: Every day | ORAL | 2 refills | Status: DC

## 2020-07-11 MED ORDER — HYDROXYZINE HCL 10 MG PO TABS: 10.0000 mg | ORAL_TABLET | Freq: Three times a day (TID) | ORAL | 2 refills | Status: DC | PRN

## 2020-07-11 MED ORDER — TRAZODONE HCL 50 MG PO TABS: 50.0000 mg | ORAL_TABLET | Freq: Every evening | ORAL | 2 refills | Status: DC | PRN

## 2020-07-11 MED ORDER — ARIPIPRAZOLE 5 MG PO TABS: 5.0000 mg | ORAL_TABLET | Freq: Every day | ORAL | 2 refills | Status: DC
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
## 2020-07-16 IMAGING — US US MFM OB FOLLOW-UP
1 series · 14 of 28 positions shown · non-contrast
Comparison: none

[Series 1: us mfm ob follow-up · 37 acquisitions, 14 frames shown]
[im 2/37]
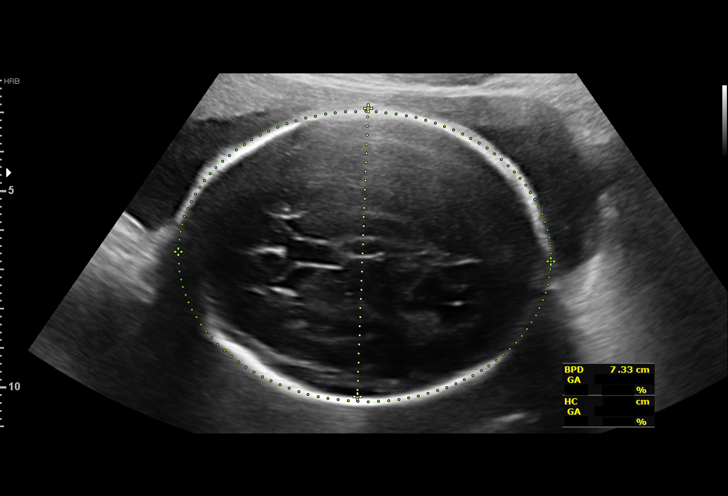
[im 5/37]
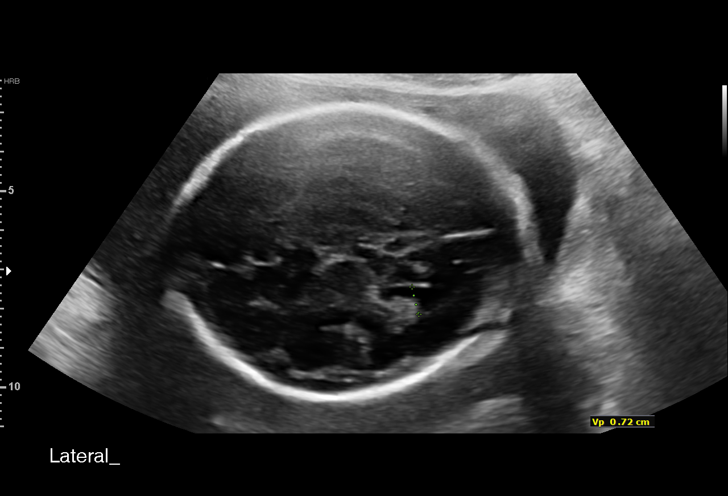
[im 7/37]
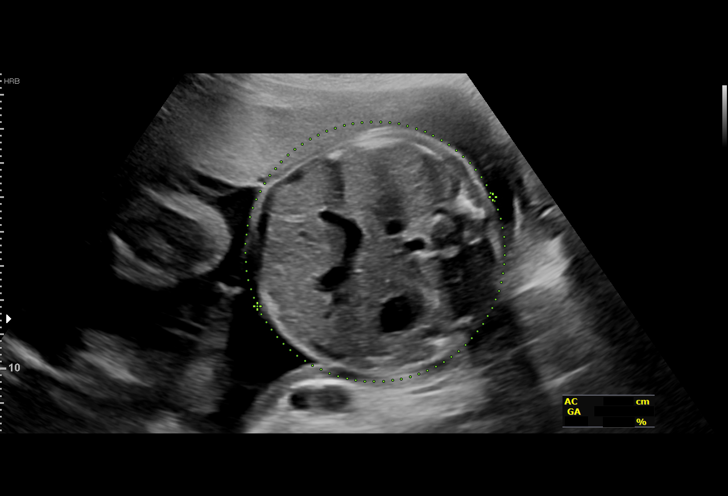
[im 10/37]
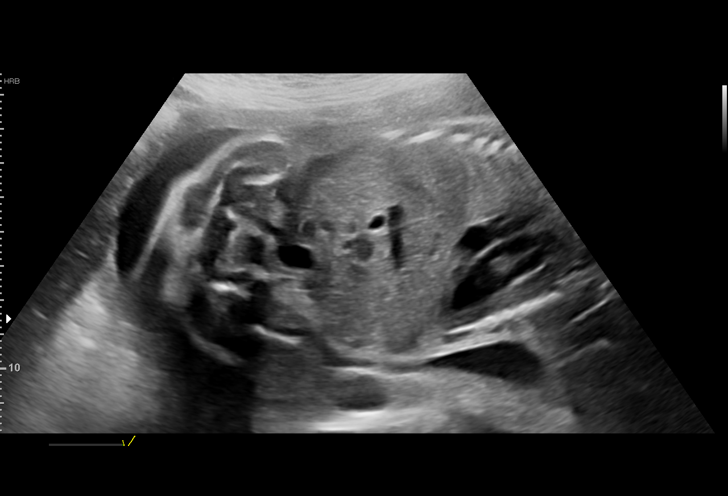
[im 13/37]
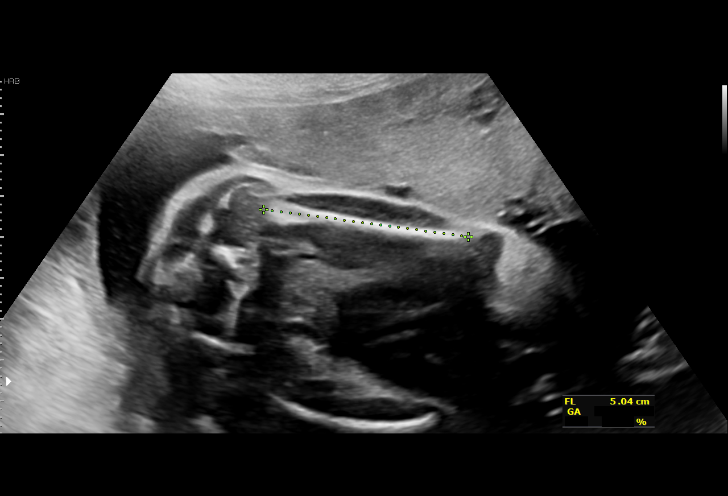
[im 15/37]
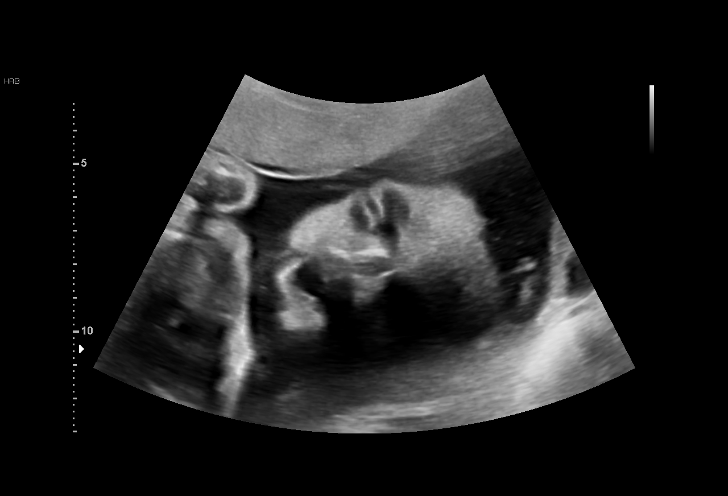
[im 18/37]
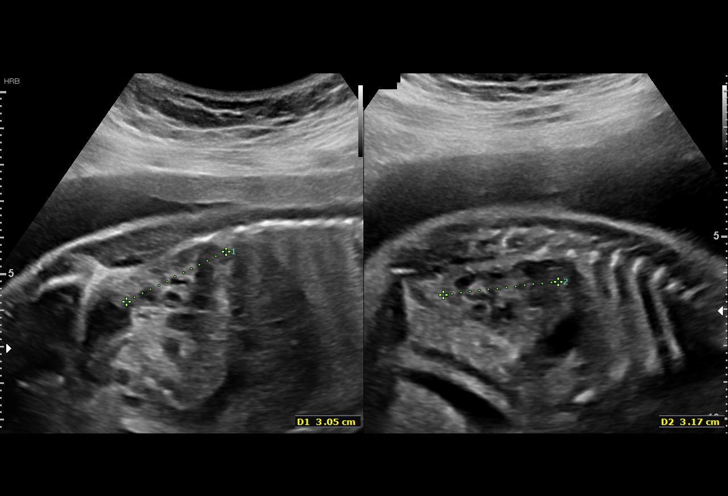
[im 21/37]
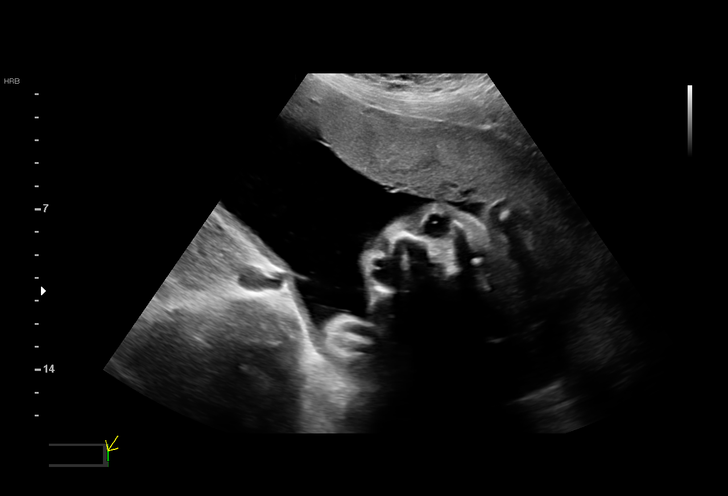
[im 23/37]
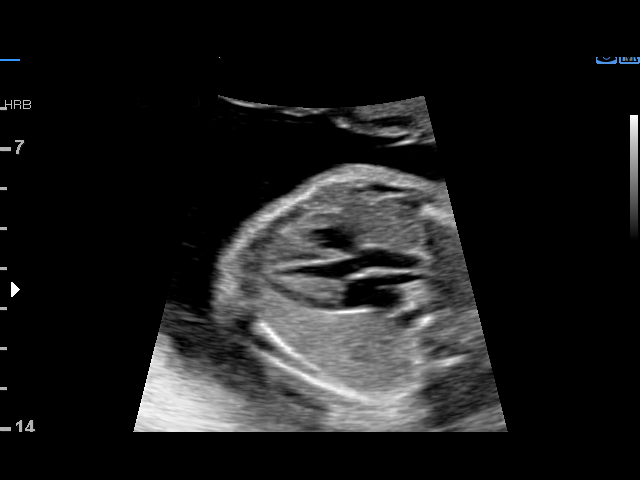
[im 26/37]
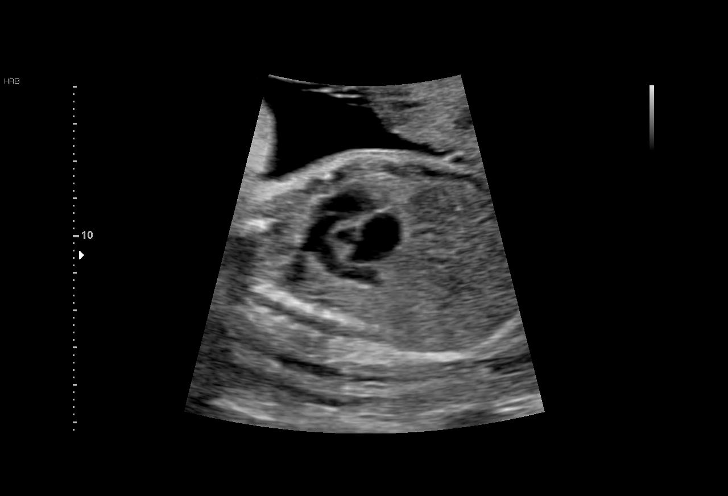
[im 29/37]
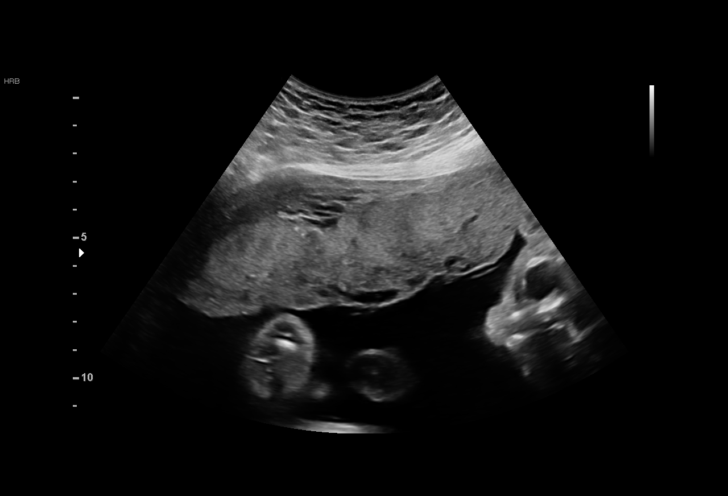
[im 31/37]
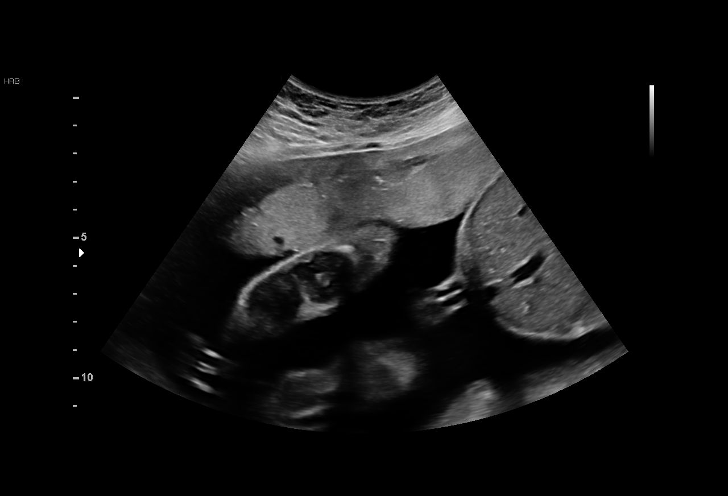
[im 34/37]
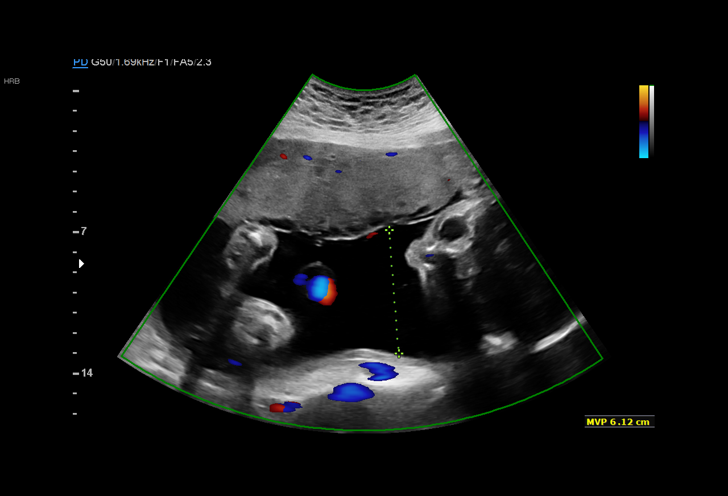
[im 37/37]
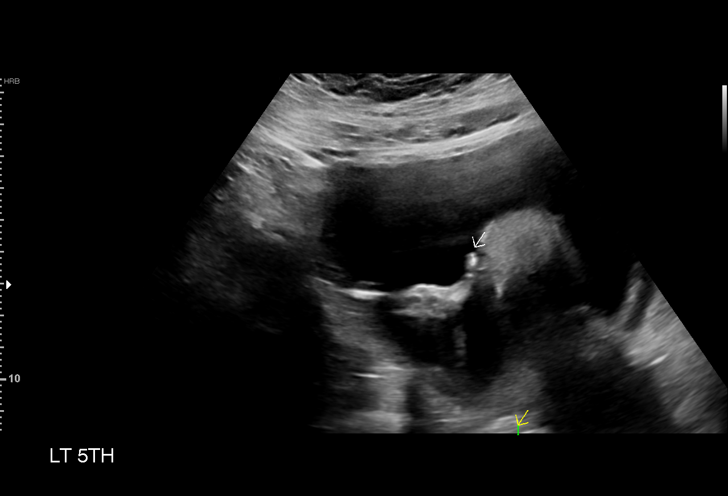

[14 of 28 positions shown; findings below may reference images not displayed]

Indications

27 weeks gestation of pregnancy
Bipolar disease during pregnancy
Other mental disorder complicating
pregnancy, second trimester -
schizoaffective disorder.
Seizure disorder (Keppra)
Advanced maternal age multigravida 35+,
second trimester (low risk NIPS, Normal
AFP)
Antenatal follow-up for nonvisualized fetal
anatomy
Vital Signs

BMI:
Fetal Evaluation

Num Of Fetuses:         1
Fetal Heart Rate(bpm):  167
Cardiac Activity:       Observed
Presentation:           Cephalic
Placenta:               Anterior
P. Cord Insertion:      Previously Visualized

Amniotic Fluid
AFI FV:      Within normal limits

Largest Pocket(cm)
6.12
Biometry
BPD:      73.5  mm     G. Age:  29w 4d         97  %    CI:         77.7   %    70 - 86
FL/HC:      18.9   %    18.6 -
HC:      263.9  mm     G. Age:  28w 5d         77  %    HC/AC:      1.11        1.05 -
AC:      237.3  mm     G. Age:  28w 0d         73  %    FL/BPD:     68.0   %    71 - 87
FL:         50  mm     G. Age:  26w 6d         33  %    FL/AC:      21.1   %    20 - 24
HUM:      47.1  mm     G. Age:  27w 5d         62  %
LV:        7.2  mm

Est. FW:    9980  gm      2 lb 8 oz     71  %
OB History

Gravidity:    2         Term:   0        Prem:   0        SAB:   1
TOP:          0       Ectopic:  0        Living: 0
Gestational Age

LMP:           27w 0d        Date:  11/07/17                 EDD:   08/14/18
U/S Today:     28w 2d                                        EDD:   08/05/18
Best:          27w 0d     Det. By:  LMP  (11/07/17)          EDD:   08/14/18
Anatomy

Cranium:               Appears normal         Aortic Arch:            Previously seen
Cavum:                 Appears normal         Ductal Arch:            Previously seen
Ventricles:            Appears normal         Diaphragm:              Appears normal
Choroid Plexus:        Previously seen        Stomach:                Appears normal, left
sided
Cerebellum:            Previously seen        Abdomen:                Appears normal
Posterior Fossa:       Previously seen        Abdominal Wall:         Previously seen
Nuchal Fold:           Previously seen        Cord Vessels:           Previously seen
Face:                  Profile nl; orbits     Kidneys:                Appear normal
previously seen
Lips:                  Previously seen        Bladder:                Appears normal
Thoracic:              Appears normal         Spine:                  Previously seen
Heart:                 Appears normal         Upper Extremities:      Previously seen
(4CH, axis, and situs
RVOT:                  Appears normal         Lower Extremities:      Previously seen
LVOT:                  Appears normal

Other:  Fetus appears to be a male. Heels visualized.
Cervix Uterus Adnexa

Cervix
Not visualized (advanced GA >52wks)
Impression

Amniotic fluid is normal and good fetal activity is seen. Fetal
growth is appropriate for gestational age. Fetal spine is
normal.

Patient takes keppra 500 mg twice daily and reports she has
not had seizures recently. She is planning to make an
appointment with her neurologist.
Recommendations

An appointment was made for her to return in 4 weeks for
fetal growth assessment.

## 2020-09-06 ENCOUNTER — Telehealth: Payer: Self-pay | Admitting: Family Medicine

## 2020-09-06 ENCOUNTER — Telehealth: Payer: Medicare (Managed Care) | Admitting: Family Medicine

## 2020-09-06 ENCOUNTER — Other Ambulatory Visit: Payer: Self-pay

## 2020-09-06 NOTE — Telephone Encounter (Signed)
Attempted to contact patient today for Telephone Virtual Visit. Message left on voicemail for patient to contact office today for any immediate issues/concerns, or to contact office to reschedule appointment.

## 2020-09-12 ENCOUNTER — Other Ambulatory Visit: Payer: Self-pay

## 2020-09-12 ENCOUNTER — Ambulatory Visit (INDEPENDENT_AMBULATORY_CARE_PROVIDER_SITE_OTHER): Payer: Medicare (Managed Care) | Admitting: Licensed Clinical Social Worker

## 2020-09-12 DIAGNOSIS — F4310 Post-traumatic stress disorder, unspecified: Secondary | ICD-10-CM

## 2020-09-12 DIAGNOSIS — F319 Bipolar disorder, unspecified: Secondary | ICD-10-CM
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
## 2020-09-16 IMAGING — US US FETAL BPP W/ NON-STRESS
1 series · 13 of 13 positions shown · non-contrast
Comparison: none

[Series 1: us fetal bpp w/nonstress · 13 acquisitions, 13 frames shown]
[im 1/13]
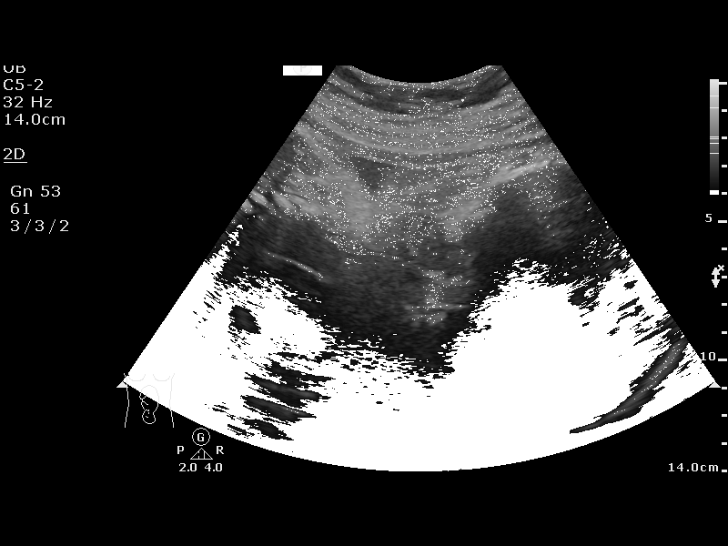
[im 2/13]
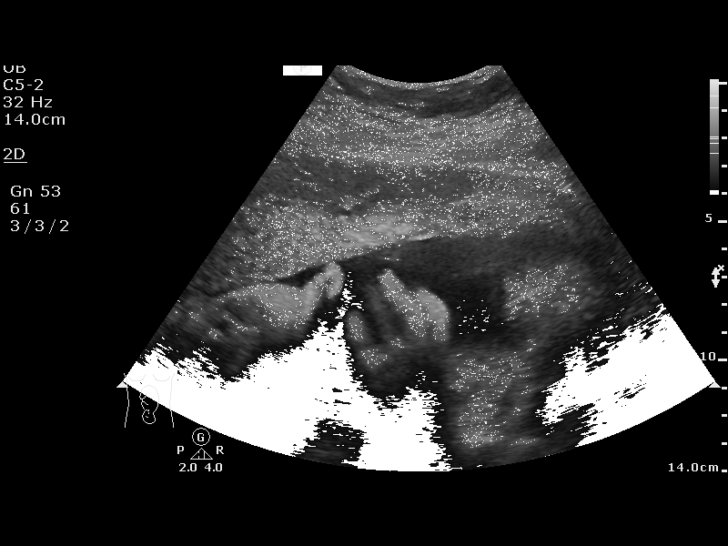
[im 3/13]
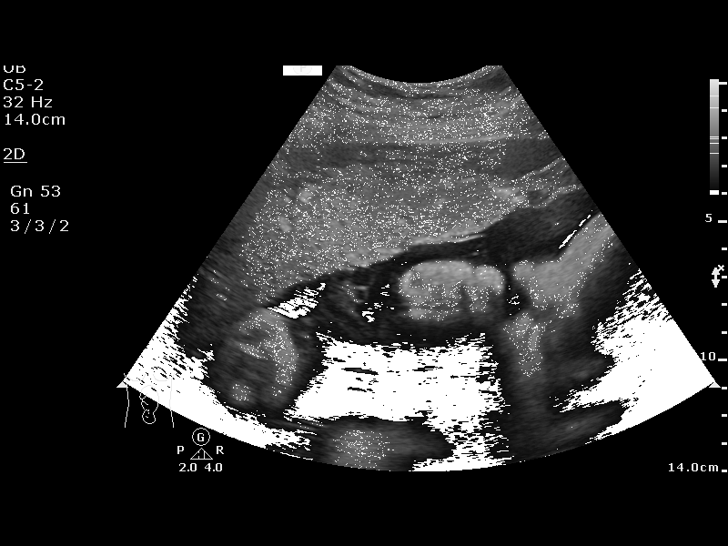
[im 4/13]
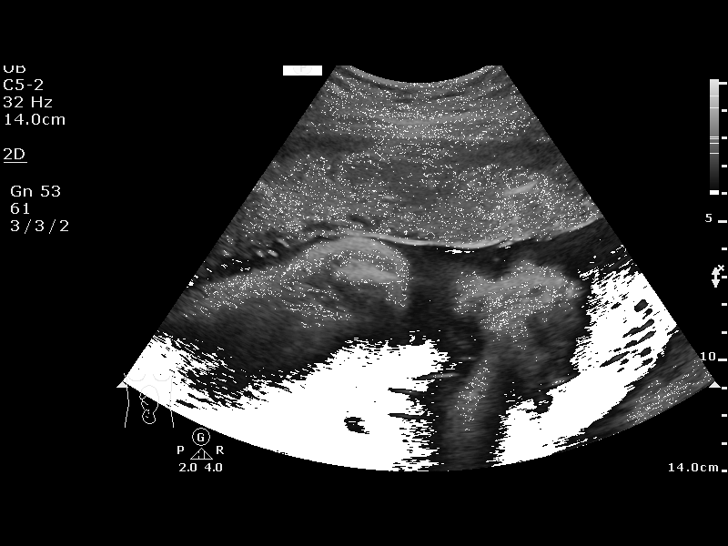
[im 5/13]
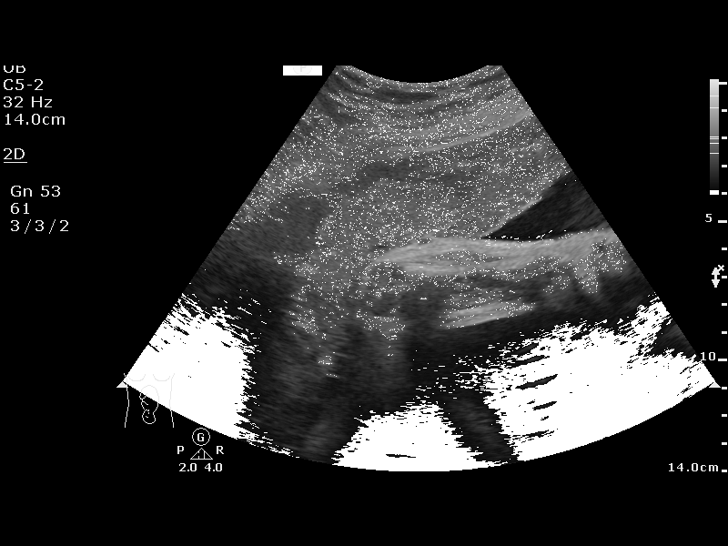
[im 6/13]
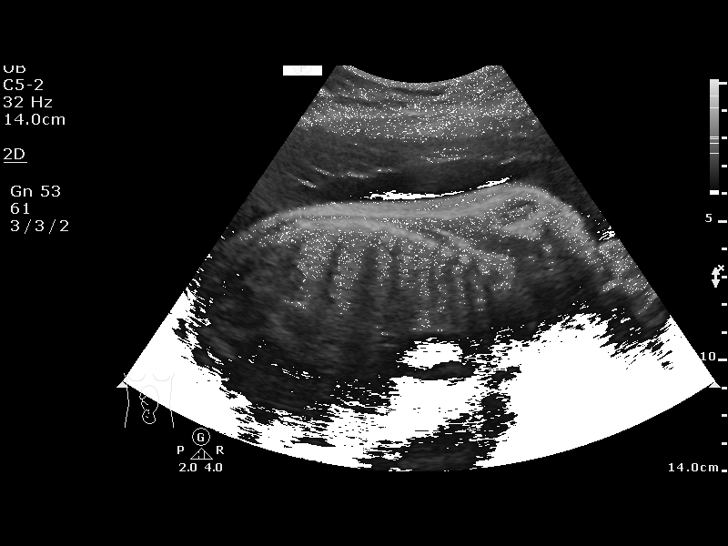
[im 7/13]
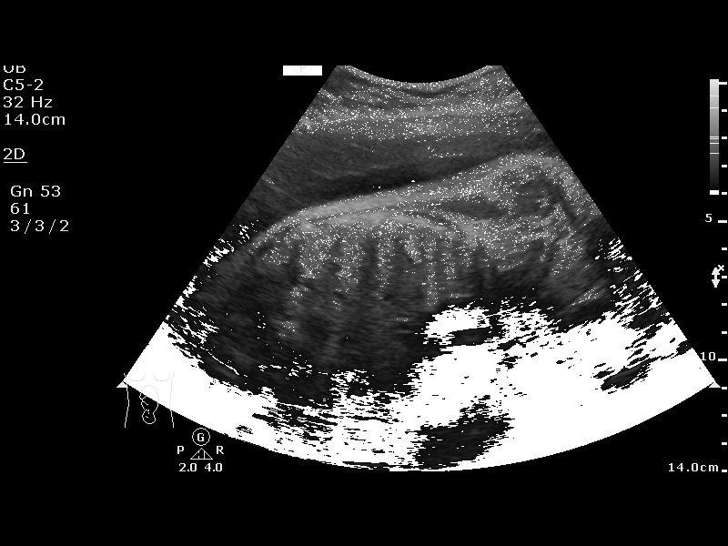
[im 8/13]
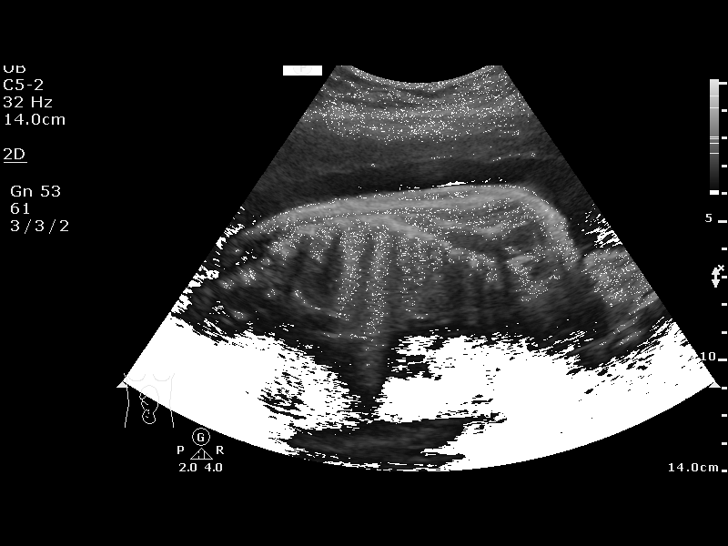
[im 9/13]
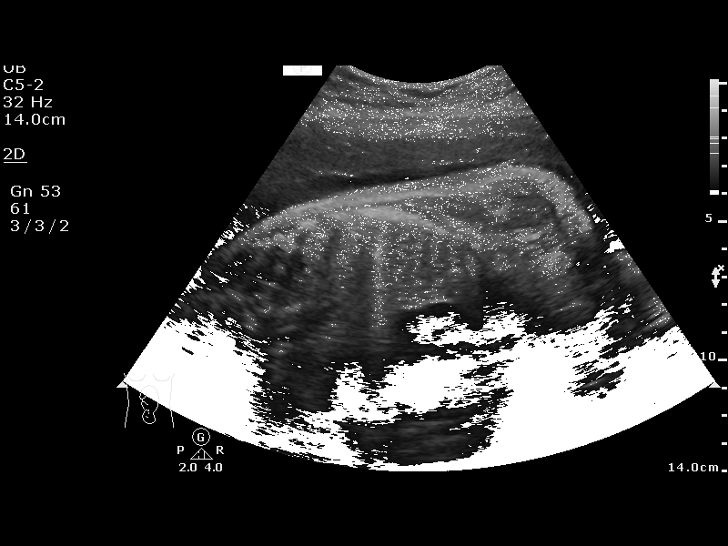
[im 10/13]
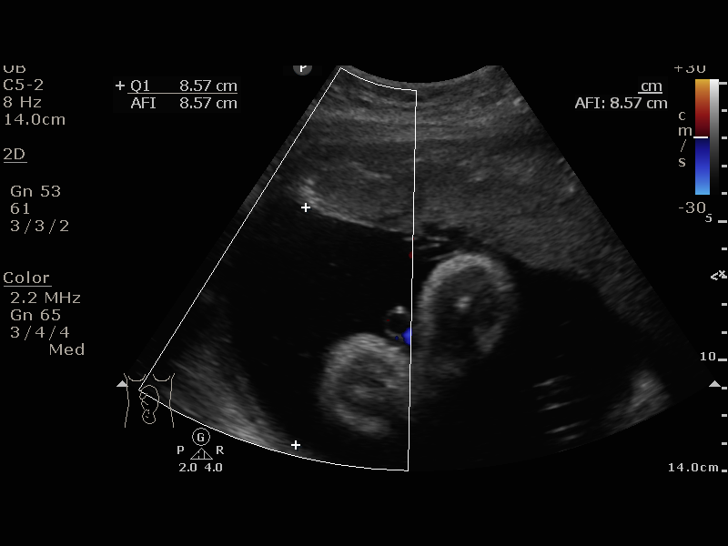
[im 11/13]
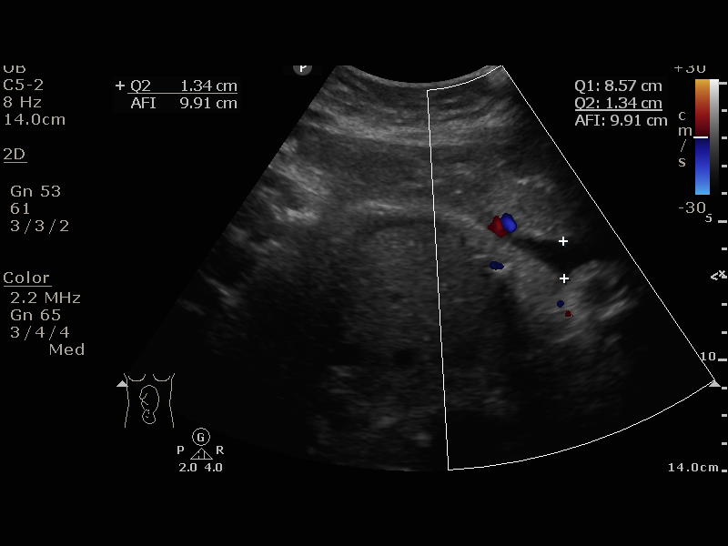
[im 12/13]
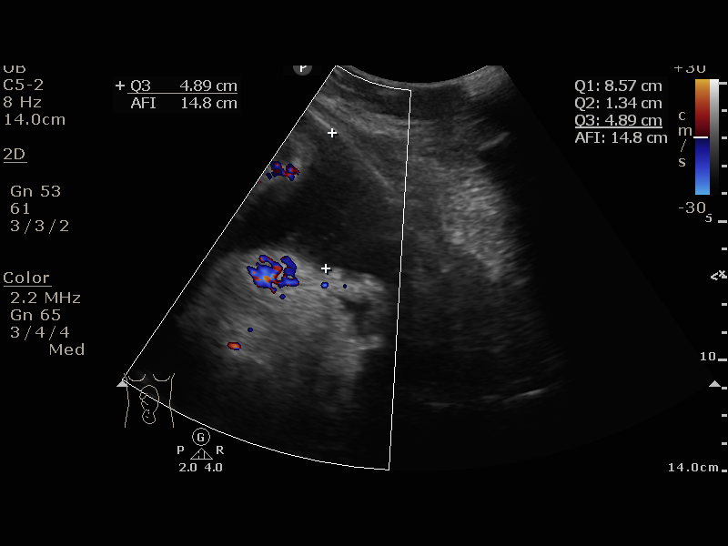
[im 13/13]
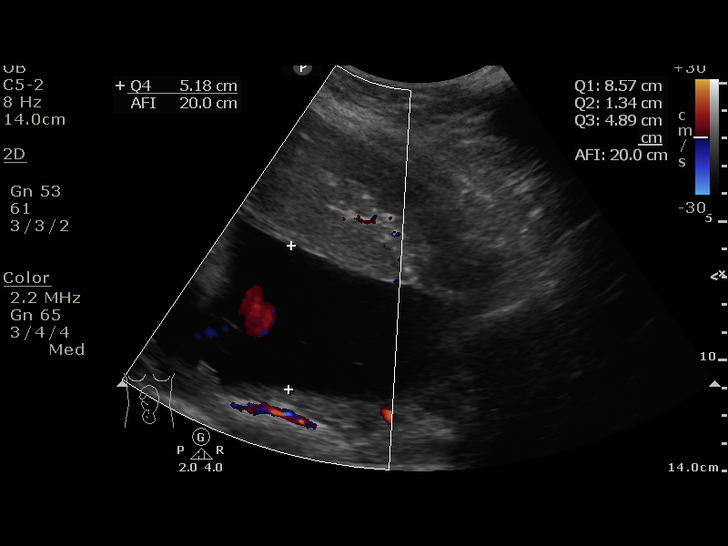

[13 of 13 positions shown; findings below may reference images not displayed]

BSN
                                                            OB/[facility]yn Clinic
                                                            Women's
                                                            [REDACTED]

  1  US FETAL BPP W/NONSTRESS             76818.4      [facility]
 ----------------------------------------------------------------------

 ----------------------------------------------------------------------
Indications

  35 weeks gestation of pregnancy
  Gestational hypertension without significant
  proteinuria, third trimester
 ----------------------------------------------------------------------
Vital Signs

                                                Height:        5'8"
Fetal Evaluation

 Num Of Fetuses:         1
 Preg. Location:         Intrauterine
 Cardiac Activity:       Observed
 Fetal Lie:              Maternal left side
 Presentation:           Cephalic

 Amniotic Fluid
 AFI FV:      Within normal limits

 AFI Sum(cm)     %Tile       Largest Pocket(cm)
 19.98           75

 RUQ(cm)       RLQ(cm)       LUQ(cm)        LLQ(cm)

Biophysical Evaluation
 Amniotic F.V:   Pocket => 2 cm two         F. Tone:        Observed
                 planes
 F. Movement:    Observed                   N.S.T:          Reactive
 F. Breathing:   Observed                   Score:          [DATE]
OB History

 Gravidity:    2         Term:   0        Prem:   0        SAB:   1
 TOP:          0       Ectopic:  0        Living: 0
Gestational Age

 LMP:           35w 6d        Date:  11/07/17                 EDD:   08/14/18
 Best:          35w 6d     Det. By:  LMP  (11/07/17)          EDD:   08/14/18
Impression

 Normal amniotic fluid volume. Antenatal testing is reassuring.
 BPP [DATE].
Recommendations

 Continue weekly antenatal testing till delivery.
              Tang, Edda

## 2020-09-28 ENCOUNTER — Telehealth: Payer: Self-pay | Admitting: Family Medicine

## 2020-09-28 ENCOUNTER — Other Ambulatory Visit: Payer: Self-pay

## 2020-09-28 ENCOUNTER — Encounter: Payer: Self-pay | Admitting: Family Medicine

## 2020-09-28 ENCOUNTER — Ambulatory Visit (INDEPENDENT_AMBULATORY_CARE_PROVIDER_SITE_OTHER): Payer: Medicare (Managed Care) | Admitting: Family Medicine

## 2020-09-28 VITALS — BP 135/80 | HR 78 | Ht 68.0 in | Wt 194.0 lb

## 2020-09-28 DIAGNOSIS — R569 Unspecified convulsions: Secondary | ICD-10-CM

## 2020-09-28 DIAGNOSIS — G40909 Epilepsy, unspecified, not intractable, without status epilepticus: Secondary | ICD-10-CM | POA: Diagnosis not present
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
## 2020-09-28 NOTE — Patient Instructions (Signed)
Below is our plan:  We will check with your pharmacy regarding your medication list. I will check labs today. I will discuss referral to epileptologist with Dr Jaynee Eagles. I feel that you most likely have more non epileptic events but due to reports of epilepsy as a child, it may be helpful to repeat workup.   Please make sure you are staying well hydrated. I recommend 50-60 ounces daily. Well balanced diet and regular exercise encouraged. Consistent sleep schedule with 6-8 hours recommended.   Please continue follow up with care team as directed.   Follow up pending labs and discussion with Dr Jaynee Eagles.   You may receive a survey regarding today's visit. I encourage you to leave honest feed back as I do use this information to improve patient care. Thank you for seeing me today!    According to [place] law, you can not drive unless you are seizure / syncope free for at least 6 months and under physician's care.  Please maintain precautions. Do not participate in activities where a loss of awareness could harm you or someone else. No swimming alone, no tub bathing, no hot tubs, no driving, no operating motorized vehicles (cars, ATVs, motocycles, etc), lawnmowers, power tools or firearms. No standing at heights, such as rooftops, ladders or stairs. Avoid hot objects such as stoves, heaters, open fires. Wear a helmet when riding a bicycle, scooter, skateboard, etc. and avoid areas of traffic. Set your water heater to 120 degrees or less.    Seizure, Adult A seizure is a sudden burst of abnormal electrical and chemical activity in the brain. Seizures usually last from 30 seconds to 2 minutes.  What are the causes? Common causes of this condition include:  Fever or infection.  Problems that affect the brain. These may include: ? A brain or head injury. ? Bleeding in the brain. ? A brain tumor.  Low levels of blood sugar or salt.  Kidney problems or liver problems.  Conditions that are passed from  parent to child (are inherited).  Problems with a substance, such as: ? Having a reaction to a drug or a medicine. ? Stopping the use of a substance all of a sudden (withdrawal).  A stroke.  Disorders that affect how you develop. Sometimes, the cause may not be known.  What increases the risk?  Having someone in your family who has epilepsy. In this condition, seizures happen again and again over time. They have no clear cause.  Having had a tonic-clonic seizure before. This type of seizure causes you to: ? Tighten the muscles of the whole body. ? Lose consciousness.  Having had a head injury or strokes before.  Having had a lack of oxygen at birth. What are the signs or symptoms? There are many types of seizures. The symptoms vary depending on the type of seizure you have. Symptoms during a seizure  Shaking that you cannot control (convulsions) with fast, jerky movements of muscles.  Stiffness of the body.  Breathing problems.  Feeling mixed up (confused).  Staring or not responding to sound or touch.  Head nodding.  Eyes that blink, flutter, or move fast.  Drooling, grunting, or making clicking sounds with your mouth  Losing control of when you pee or poop. Symptoms before a seizure  Feeling afraid, nervous, or worried.  Feeling like you may vomit.  Feeling like: ? You are moving when you are not. ? Things around you are moving when they are not.  Feeling like you  saw or heard something before (dj vu).  Odd tastes or smells.  Changes in how you see. You may see flashing lights or spots. Symptoms after a seizure  Feeling confused.  Feeling sleepy.  Headache.  Sore muscles. How is this treated? If your seizure stops on its own, you will not need treatment. If your seizure lasts longer than 5 minutes, you will normally need treatment. Treatment may include:  Medicines given through an IV tube.  Avoiding things, such as medicines, that are known  to cause your seizures.  Medicines to prevent seizures.  A device to prevent or control seizures.  Surgery.  A diet low in carbohydrates and high in fat (ketogenic diet). Follow these instructions at home: Medicines  Take over-the-counter and prescription medicines only as told by your doctor.  Avoid foods or drinks that may keep your medicine from working, such as alcohol. Activity  Follow instructions about driving, swimming, or doing things that would be dangerous if you had another seizure. Wait until your doctor says it is safe for you to do these things.  If you live in the [place] ask your local department of motor vehicles when you can drive.  Get a lot of rest. Teaching others  Teach friends and family what to do when you have a seizure. They should: ? Help you get down to the ground. ? Protect your head and body. ? Loosen any clothing around your neck. ? Turn you on your side. ? Know whether or not you need emergency care. ? Stay with you until you are better.  Also, tell them what not to do if you have a seizure. Tell them: ? They should not hold you down. ? They should not put anything in your mouth.   General instructions  Avoid anything that gives you seizures.  Keep a seizure diary. Write down: ? What you remember about each seizure. ? What you think caused each seizure.  Keep all follow-up visits. Contact a doctor if:  You have another seizure or seizures. Call the doctor each time you have a seizure.  The pattern of your seizures changes.  You keep having seizures with treatment.  You have symptoms of being sick or having an infection.  You are not able to take your medicine. Get help right away if:  You have any of these problems: ? A seizure that lasts longer than 5 minutes. ? Many seizures in a row and you do not feel better between seizures. ? A seizure that makes it harder to breathe. ? A seizure and you can no longer speak or use part  of your body.  You do not wake up right after a seizure.  You get hurt during a seizure.  You feel confused or have pain right after a seizure. These symptoms may be an emergency. Get help right away. Call your local emergency services (911 in the [place]  Do not wait to see if the symptoms will go away.  Do not drive yourself to the hospital. Summary  A seizure is a sudden burst of abnormal electrical and chemical activity in the brain. Seizures normally last from 30 seconds to 2 minutes.  Causes of seizures include illness, injury to the head, low levels of blood sugar or salt, and certain conditions.  Most seizures will stop on their own in less than 5 minutes. Seizures that last longer than 5 minutes are a medical emergency and need treatment right away.  Many medicines are used  to treat seizures. Take over-the-counter and prescription medicines only as told by your doctor. This information is not intended to replace advice given to you by your health care provider. Make sure you discuss any questions you have with your health care provider. Document Revised: 02/12/2020 Document Reviewed: 02/12/2020 Elsevier Patient Education  [place].

## 2020-09-28 NOTE — Telephone Encounter (Signed)
Can you please help me track down when this patient last filled levetiracetam. I saw her in follow up today but she was not able to tell me the dose or pharmacy used for levetiracetam. My last note reported lev at 750mg  BID. Now listed at 500mg  BID and last written by Lindell Spar. Patient does not recognize this name and states current psychiatrist is Dr Burt Ek. She does not know why dose was reduced. TY for your help!

## 2020-09-29 NOTE — Telephone Encounter (Signed)
I called [facility] no keppra rx, [facility] only keppra 500mg   07-07-20 from ED visit (pt did not pick up).  Other [facility] listed 08-09-19 Keppra 750mg  bid 30 days supply picked up, 11-04-19 keppra 500mg  15 day supply BH. Marilynne Drivers pcp listed has not seen pt since 2019.  Was seen in ED 11-01-19 for Keppra OD (750mg  tabs).  From discharge was then changed to 500mg  tabs.  Last note from 09-12-20, [facility] counselor [facility] is pharmacy.

## 2020-10-04 LAB — COMPREHENSIVE METABOLIC PANEL
ALT: 13 IU/L (ref 0–32)
AST: 17 IU/L (ref 0–40)
Albumin/Globulin Ratio: 1.8 (ref 1.2–2.2)
Albumin: 4.8 g/dL (ref 3.8–4.8)
Alkaline Phosphatase: 105 IU/L (ref 44–121)
BUN/Creatinine Ratio: 21 (ref 9–23)
BUN: 17 mg/dL (ref 6–24)
Bilirubin Total: 0.8 mg/dL (ref 0.0–1.2)
CO2: 20 mmol/L (ref 20–29)
Calcium: 9.7 mg/dL (ref 8.7–10.2)
Chloride: 105 mmol/L (ref 96–106)
Creatinine, Ser: 0.8 mg/dL (ref 0.57–1.00)
GFR calc Af Amer: 107 mL/min/{1.73_m2} (ref >59)
GFR calc non Af Amer: 93 mL/min/{1.73_m2} (ref >59)
Globulin, Total: 2.6 g/dL (ref 1.5–4.5)
Glucose: 72 mg/dL (ref 65–99)
Potassium: 4.4 mmol/L (ref 3.5–5.2)
Sodium: 142 mmol/L (ref 134–144)
Total Protein: 7.4 g/dL (ref 6.0–8.5)

## 2020-10-04 LAB — CBC WITH DIFFERENTIAL/PLATELET
Basophils Absolute: 0.1 10*3/uL (ref 0.0–0.2)
Basos: 1 %
EOS (ABSOLUTE): 0.1 10*3/uL (ref 0.0–0.4)
Eos: 0 %
Hematocrit: 49.4 % — ABNORMAL HIGH (ref 34.0–46.6)
Hemoglobin: 16.8 g/dL — ABNORMAL HIGH (ref 11.1–15.9)
Immature Grans (Abs): 0 10*3/uL (ref 0.0–0.1)
Immature Granulocytes: 0 %
Lymphocytes Absolute: 2.5 10*3/uL (ref 0.7–3.1)
Lymphs: 18 %
MCH: 30.5 pg (ref 26.6–33.0)
MCHC: 34 g/dL (ref 31.5–35.7)
MCV: 90 fL (ref 79–97)
Monocytes Absolute: 0.8 10*3/uL (ref 0.1–0.9)
Monocytes: 6 %
Neutrophils Absolute: 10.1 10*3/uL — ABNORMAL HIGH (ref 1.4–7.0)
Neutrophils: 75 %
Platelets: 326 10*3/uL (ref 150–450)
RBC: 5.5 x10E6/uL — ABNORMAL HIGH (ref 3.77–5.28)
RDW: 12.4 % (ref 11.7–15.4)
WBC: 13.5 10*3/uL — ABNORMAL HIGH (ref 3.4–10.8)

## 2020-10-04 LAB — LEVETIRACETAM LEVEL: Levetiracetam Lvl: <1 ug/mL — ABNORMAL LOW (ref 10.0–40.0)

## 2020-10-04 NOTE — Telephone Encounter (Signed)
Dismiss patient for non compliance. Thank you

## 2020-10-04 NOTE — Telephone Encounter (Signed)
Please advise. Levetiracetam level was <1 when checked last week. Patient reports taking medications twice daily but unable to tell me dose. Please review Sandy's note below regarding pharmacy details.

## 2020-10-06 ENCOUNTER — Encounter: Payer: Self-pay | Admitting: Neurology

## 2020-10-12 ENCOUNTER — Ambulatory Visit (INDEPENDENT_AMBULATORY_CARE_PROVIDER_SITE_OTHER): Payer: Medicare (Managed Care) | Admitting: Psychiatry

## 2020-10-12 ENCOUNTER — Other Ambulatory Visit: Payer: Self-pay

## 2020-10-12 ENCOUNTER — Encounter (HOSPITAL_COMMUNITY): Payer: Self-pay | Admitting: Psychiatry

## 2020-10-12 ENCOUNTER — Other Ambulatory Visit: Payer: Self-pay | Admitting: Psychiatry

## 2020-10-12 DIAGNOSIS — F4310 Post-traumatic stress disorder, unspecified: Secondary | ICD-10-CM | POA: Diagnosis not present

## 2020-10-12 DIAGNOSIS — F411 Generalized anxiety disorder: Secondary | ICD-10-CM | POA: Diagnosis not present

## 2020-10-12 DIAGNOSIS — F319 Bipolar disorder, unspecified: Secondary | ICD-10-CM

## 2020-10-12 MED ORDER — ARIPIPRAZOLE 5 MG PO TABS: 5.0000 mg | ORAL_TABLET | Freq: Every day | ORAL | 2 refills | Status: DC

## 2020-10-12 MED ORDER — PRAZOSIN HCL 1 MG PO CAPS: 1.0000 mg | ORAL_CAPSULE | Freq: Every day | ORAL | 2 refills | Status: DC

## 2020-10-12 MED ORDER — TRAZODONE HCL 50 MG PO TABS: 50.0000 mg | ORAL_TABLET | Freq: Every evening | ORAL | 2 refills | Status: DC | PRN

## 2020-10-12 MED ORDER — HYDROXYZINE HCL 10 MG PO TABS: 10.0000 mg | ORAL_TABLET | Freq: Three times a day (TID) | ORAL | 2 refills | Status: DC | PRN

## 2020-10-12 MED FILL — hydrOXYzine HCL 10 MG TABS: 10 | 30 days supply | Qty: 90 | Fill #0

## 2020-10-12 MED FILL — PRAZOSIN 1 MG CAPSULE: 1 | 30 days supply | Qty: 30 | Fill #0

## 2020-10-12 MED FILL — ARIPiprazole 5 MG TABS: 5 | 30 days supply | Qty: 30 | Fill #0

## 2020-10-12 MED FILL — traZODone HCL 50 MG TABS: 50 | 30 days supply | Qty: 30 | Fill #0
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
## 2020-10-17 ENCOUNTER — Ambulatory Visit (INDEPENDENT_AMBULATORY_CARE_PROVIDER_SITE_OTHER): Payer: Medicare (Managed Care) | Admitting: Licensed Clinical Social Worker

## 2020-10-17 ENCOUNTER — Other Ambulatory Visit: Payer: Self-pay

## 2020-10-17 DIAGNOSIS — F3162 Bipolar disorder, current episode mixed, moderate: Secondary | ICD-10-CM

## 2020-10-17 NOTE — Progress Notes (Signed)
   THERAPIST PROGRESS NOTE  Session Time: 43  Participation Level: Active  Behavioral Response: CasualAlertDepressed  Type of Therapy: Individual Therapy  Treatment Goals addressed: Diagnosis: anxiety and depression   Interventions: CBT and Supportive  Summary: Amanda [name] is a 40 y.o. adult who presents with bipolar disorder, GAD, PTSD.   Suicidal/Homicidal: NAwithout intent/plan  Therapist Response:   Therapist Response:    Subjective/Objective:  Pt was alert and oriented x 5. He was dressed casually and fairly groomed. Pt presented with euthymic mood/affect. He was cooperative and maintained good eye contact.   Pt primary stressor is housing and legal. He is still in the process of getting his son out of state custody. Pt has been doing his best to comply with court mandates. He is doing parents classes, coming to mental health for T[place], and attempting to obtaining housing. Amanda [name] is housing. He has a house lined up and was supposed to move in on Saturday. Pt got the address wet and lost the ability to read the address. He has tried multiple times to get ahold of the landlord but has yet to make contact. Pt has already put 475.00 in fees/rent down to hold the house. Amanda [name] states he needs to be in the home by May 11th to stay within court mandates.    Assessment: Pt endorses symptoms for tension, worry, and restlessness along with depression symptoms for worthlessness, hopelessness, insomnia, and poor appetite. Pt does meet criteria for GAD and bipolar 1 disorder. He is complaint with medication currently.   Plan: LCSW utilized supportive therapy in session. Pt to call landlord 2 x daily until a return call is made. Pt to see son x 2 weekly for supervised visits, He will also journal 4 x weekly once he gets his computer back.   Plan: Return again in 3 weeks.    [name], LCSW 10/17/2020

## 2020-11-02 ENCOUNTER — Ambulatory Visit (HOSPITAL_COMMUNITY): Payer: Medicare (Managed Care) | Admitting: Licensed Clinical Social Worker

## 2020-11-03 ENCOUNTER — Ambulatory Visit (INDEPENDENT_AMBULATORY_CARE_PROVIDER_SITE_OTHER): Payer: Medicare (Managed Care) | Admitting: Licensed Clinical Social Worker

## 2020-11-03 ENCOUNTER — Other Ambulatory Visit: Payer: Self-pay

## 2020-11-03 DIAGNOSIS — F3181 Bipolar II disorder: Secondary | ICD-10-CM

## 2020-11-03 NOTE — Progress Notes (Signed)
   THERAPIST PROGRESS NOTE  Session Time: 58  Participation Level: Active  Behavioral Response: CasualAlertAnxious and Depressed  Type of Therapy: Individual Therapy  Treatment Goals addressed: Diagnosis: bipolar disorder   Interventions: CBT and Supportive  Summary: Amanda [name] is a 40 y.o. adult who presents with bipolar disorder.   Suicidal/Homicidal: NAwithout intent/plan  Therapist Response:    Subjective/Objective:  Pt was alert and oriented x 5. He was dressed casually and engaged well in therapy session. Pt presented with anxious mood/affect. Amanda [name] was cooperative and maintained good eye contact.   He presented today with stressor for legal/custody stress along with family conflict. Pt has been in the process of complying with DSS to get custody of his son back. Amanda [name] reports that one of the requirements is housing. He was supposed to move into a house in late Feb when he lost the address. Now he has an address and a key but no lease. Pt reports that the landlord sent the key but said he was not supposed to use it until the lease was signed. Pt has called the landlord multiple times to get the lease signed. The hope is they call back today.   Pt father who lives in [place] asked Amanda [name] to move down there once he got custody of the child back. Amanda [name] states that his father was absent most of his life and drank a lot of alcohol. Amanda [name] reports that his father is not aware of his transition either. Pt state he is going to tell his father "Do to DSS requirements it may not be the right time for a move to occur."     Assessment/Plan: Pt endorses symptoms for tension, worry, irritability, mood swings, depression fatigue, insomnia, and worthlessness. Currently pt meets criteria for bipolar 2 disorder. He is taking all medication as prescribed.   Plan: Obtain housing lease and sign it, continue to attend monthly therapy session, and journal daily.  Plan: Return again in 4  weeks.    [name], LCSW 11/03/2020

## 2020-11-23 ENCOUNTER — Ambulatory Visit (HOSPITAL_COMMUNITY): Payer: Self-pay | Admitting: Licensed Clinical Social Worker

## 2020-11-29 ENCOUNTER — Ambulatory Visit (INDEPENDENT_AMBULATORY_CARE_PROVIDER_SITE_OTHER): Payer: Medicare (Managed Care) | Admitting: Licensed Clinical Social Worker

## 2020-11-29 ENCOUNTER — Emergency Department (HOSPITAL_COMMUNITY)
Admission: EM | Admit: 2020-11-29 | Discharge: 2020-11-30 | Disposition: A | Discharge status: Other Acute Inpatient Hospital | Payer: Medicare (Managed Care) | Attending: Emergency Medicine | Admitting: Emergency Medicine

## 2020-11-29 ENCOUNTER — Other Ambulatory Visit: Payer: Self-pay

## 2020-11-29 ENCOUNTER — Encounter (HOSPITAL_COMMUNITY): Payer: Self-pay | Admitting: Registered Nurse

## 2020-11-29 ENCOUNTER — Ambulatory Visit (HOSPITAL_COMMUNITY)
Admission: EM | Admit: 2020-11-29 | Discharge: 2020-11-29 | Disposition: A | Discharge status: Other Acute Inpatient Hospital | Payer: Medicare (Managed Care) | Attending: Registered Nurse | Admitting: Registered Nurse

## 2020-11-29 DIAGNOSIS — Z9151 Personal history of suicidal behavior: Secondary | ICD-10-CM | POA: Insufficient documentation

## 2020-11-29 DIAGNOSIS — F329 Major depressive disorder, single episode, unspecified: Secondary | ICD-10-CM

## 2020-11-29 DIAGNOSIS — F603 Borderline personality disorder: Secondary | ICD-10-CM | POA: Diagnosis not present

## 2020-11-29 DIAGNOSIS — Z91040 Latex allergy status: Secondary | ICD-10-CM | POA: Diagnosis not present

## 2020-11-29 DIAGNOSIS — F429 Obsessive-compulsive disorder, unspecified: Secondary | ICD-10-CM | POA: Diagnosis not present

## 2020-11-29 DIAGNOSIS — R45851 Suicidal ideations: Secondary | ICD-10-CM | POA: Diagnosis not present

## 2020-11-29 DIAGNOSIS — Z5900 Homelessness unspecified: Secondary | ICD-10-CM | POA: Insufficient documentation

## 2020-11-29 DIAGNOSIS — R21 Rash and other nonspecific skin eruption: Secondary | ICD-10-CM | POA: Diagnosis not present

## 2020-11-29 DIAGNOSIS — I10 Essential (primary) hypertension: Secondary | ICD-10-CM | POA: Diagnosis not present

## 2020-11-29 DIAGNOSIS — F411 Generalized anxiety disorder: Secondary | ICD-10-CM | POA: Diagnosis not present

## 2020-11-29 DIAGNOSIS — F333 Major depressive disorder, recurrent, severe with psychotic symptoms: Secondary | ICD-10-CM | POA: Diagnosis not present

## 2020-11-29 DIAGNOSIS — J45909 Unspecified asthma, uncomplicated: Secondary | ICD-10-CM | POA: Insufficient documentation

## 2020-11-29 DIAGNOSIS — F29 Unspecified psychosis not due to a substance or known physiological condition: Secondary | ICD-10-CM | POA: Diagnosis not present

## 2020-11-29 DIAGNOSIS — F3181 Bipolar II disorder: Secondary | ICD-10-CM

## 2020-11-29 DIAGNOSIS — R441 Visual hallucinations: Secondary | ICD-10-CM | POA: Insufficient documentation

## 2020-11-29 DIAGNOSIS — F4310 Post-traumatic stress disorder, unspecified: Secondary | ICD-10-CM | POA: Insufficient documentation

## 2020-11-29 DIAGNOSIS — Z20822 Contact with and (suspected) exposure to covid-19: Secondary | ICD-10-CM | POA: Diagnosis not present

## 2020-11-29 DIAGNOSIS — F17210 Nicotine dependence, cigarettes, uncomplicated: Secondary | ICD-10-CM | POA: Diagnosis not present

## 2020-11-29 DIAGNOSIS — R443 Hallucinations, unspecified: Secondary | ICD-10-CM

## 2020-11-29 DIAGNOSIS — F332 Major depressive disorder, recurrent severe without psychotic features: Secondary | ICD-10-CM | POA: Insufficient documentation

## 2020-11-29 DIAGNOSIS — F251 Schizoaffective disorder, depressive type: Secondary | ICD-10-CM | POA: Insufficient documentation

## 2020-11-29 LAB — COMPREHENSIVE METABOLIC PANEL
ALT: 12 U/L (ref 0–44)
AST: 15 U/L (ref 15–41)
Albumin: 4.1 g/dL (ref 3.5–5.0)
Alkaline Phosphatase: 83 U/L (ref 38–126)
Anion gap: 6 (ref 5–15)
BUN: 9 mg/dL (ref 6–20)
CO2: 25 mmol/L (ref 22–32)
Calcium: 9.2 mg/dL (ref 8.9–10.3)
Chloride: 107 mmol/L (ref 98–111)
Creatinine, Ser: 0.69 mg/dL (ref 0.44–1.00)
GFR, Estimated: >60 mL/min (ref >60)
Glucose, Bld: 75 mg/dL (ref 70–99)
Potassium: 3.7 mmol/L (ref 3.5–5.1)
Sodium: 138 mmol/L (ref 135–145)
Total Bilirubin: 1.2 mg/dL (ref 0.3–1.2)
Total Protein: 6.9 g/dL (ref 6.5–8.1)

## 2020-11-29 LAB — SALICYLATE LEVEL: Salicylate Lvl: <7 mg/dL — ABNORMAL LOW (ref 7.0–30.0)

## 2020-11-29 LAB — CBC
HCT: 47.3 % — ABNORMAL HIGH (ref 36.0–46.0)
Hemoglobin: 15.7 g/dL — ABNORMAL HIGH (ref 12.0–15.0)
MCH: 30.7 pg (ref 26.0–34.0)
MCHC: 33.2 g/dL (ref 30.0–36.0)
MCV: 92.6 fL (ref 80.0–100.0)
Platelets: 299 10*3/uL (ref 150–400)
RBC: 5.11 MIL/uL (ref 3.87–5.11)
RDW: 13.2 % (ref 11.5–15.5)
WBC: 12 10*3/uL — ABNORMAL HIGH (ref 4.0–10.5)
nRBC: 0 % (ref 0.0–0.2)

## 2020-11-29 LAB — I-STAT BETA HCG BLOOD, ED (MC, WL, AP ONLY): I-stat hCG, quantitative: <5 m[IU]/mL (ref <5)

## 2020-11-29 LAB — ACETAMINOPHEN LEVEL: Acetaminophen (Tylenol), Serum: <10 ug/mL — ABNORMAL LOW (ref 10–30)

## 2020-11-29 LAB — ETHANOL: Alcohol, Ethyl (B): <10 mg/dL (ref <10)

## 2020-11-29 MED ORDER — CLOTRIMAZOLE 1 % EX CREA
TOPICAL_CREAM | Freq: Two times a day (BID) | CUTANEOUS | Status: DC
  Filled 2020-11-29 (×2): qty 15

## 2020-11-29 NOTE — ED Triage Notes (Signed)
Pt c/o auditory and visual hallucinations and suicidal ideations that started 5 days ago. Pt was seen at [facility] and sent here for evaluation.

## 2020-11-29 NOTE — Progress Notes (Signed)
   THERAPIST PROGRESS NOTE  Session Time: 10  Participation Level: Active  Behavioral Response: CasualAlertAnxious and Depressed  Type of Therapy: Individual Therapy  Treatment Goals addressed: Diagnosis: depression and anxiety   Interventions: CBT and Supportive  Summary: Amanda [name] is a 40 y.o. adult who presents with bipolar disorder.   Suicidal/Homicidal: NAwithout intent/plan Therapist Response:    Subjective/Objective:  Pt was alert and oriented x 5. She was dressed casually and engaged well in therapy session. Pt presented with depressed mood/affect.   Pt started out with primary stressor of housing and financials. He reports that he was scammed. He was attempting to obtain housing for to get custody of his son back. Pt found out after spending 1300.00 dollar and speaking with DSS + Lawyer that the housing was a scam. This caused pt and his significant other to break up as pt states his significant other blame Amanda [name] for the housing problems.  Since this incident of the breakup and finding out the housing was a scam pt has start to have AH and VH. Amanda [name] states that objects turn into puddles of blood with glowing eyes. Voices also stating to himself that he should hurt himself. LCSW inquired about SI/HI and pt stated that he wants to take all his medications presided to him at once because he feels worthless. LCSW asked where his medications were, and he stated that his brother Amanda [name] has them.   Plan: Pt was sent down to [facility] for further evaluation due to active SI with AVH. LCSW escorted pt downstairs.     Assessment: Pt endorses symptoms for symptoms for suicidal ideations, AVH, worthlessness, hopelessness, fatigue, insomnia, and irritability. Pt has been dx with bipolar disorder and now meets criteria for psychosis unspecified. He states that he is taking his medications as prescribed.  Plan: Return again in 4 weeks.     Amanda Horn, LCSW 11/29/2020

## 2020-11-29 NOTE — ED Triage Notes (Signed)
Emergency Medicine Provider Triage Evaluation Note  Amanda [name] , a 40 y.o. adult  was evaluated in triage.  Pt complains of suicidal ideation as well as hallucinations.  Amanda [name] is that she wants to end her life by himself with her dog's leash, she also states that she is seeing fibrin covered in blood and green people around her.  She has no other complaints at this time.  Denies any illicit drug use..  Review of Systems  Positive: Suicidal ideation as well as hallucinations and delusions Negative: Denies headaches, fevers, chills, shortness breath, chest pain, abdominal pain nausea vomiting  Physical Exam  BP 132/87 (BP Location: Left Arm)   Pulse 64   Temp 98.3 F (36.8 C) (Oral)   Resp 16   Ht 5\' 4"  (1.626 m)   Wt 81.6 kg   SpO2 100%   BMI 30.90 kg/m  Gen:   Awake, no distress   HEENT:  Atraumatic  Resp:  Normal effort  Cardiac:  Normal rate  MSK:   Moves extremities without difficulty  Neuro:  Speech clear   Medical Decision Making  Medically screening exam initiated at 8:21 PM.  Appropriate orders placed.  Amanda [name] was informed that the remainder of the evaluation will be completed by another provider, this initial triage assessment does not replace that evaluation, and the importance of remaining in the ED until their evaluation is complete.  Clinical Impression  Presents with suicidal ideations as well as hallucinations delusions.  Lab work has been ordered Amanda will need further work-up here in the ED   Marcello Fennel, PA-C 11/29/20 2023
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
## 2020-11-29 NOTE — ED Provider Notes (Signed)
[facility] EMERGENCY DEPARTMENT Provider Note   CSN: [id number] Arrival date & time: 11/29/20  1818     History Chief Complaint  Patient presents with  . Suicidal    Amanda [name] is a 40 y.o. adult.  Patient with history of seizures, PTSD, anxiety, bipolar, depression, mood disorder, OCD, SI, RA, HTN presents with suicidal ideations and visual hallucinations. He was seen by his therapist earlier today and it was recommended he come here for consideration for psychiatric admission.   The history is provided by the patient. No language interpreter was used.       Past Medical History:  Diagnosis Date  . Anxiety   . Asthma   . Asthma due to environmental allergies   . Asthma due to seasonal allergies   . Bipolar 1 disorder ([place])   . Borderline personality disorder ([place])   . Brain bleed ([place])   . Chronic post-traumatic stress disorder (PTSD)    complex chronic with psychotic features and self harm behaviors  . Complication of anesthesia   . Constipation   . Dander (animal) allergy   . Hearing loss    right ear  . Heart murmur    denies seeing a cardiologist  . Hip dysplasia   . Hypertension    Gestional   . Major depression, chronic   . Mood disorder ([place])   . OCD (obsessive compulsive disorder)   . Pneumonia   . PONV (postoperative nausea and vomiting)   . PTSD (post-traumatic stress disorder)   . RA (rheumatoid arthritis) ([place])   . Rheumatoid arthritis ([place])   . Schizophrenia ([place])    "I think that is wrong"  . Seizure disorder ([place])   . Suicidal ideations   . Suicide attempt [facility])     Patient Active Problem List   Diagnosis Date Noted  . MDD (major depressive disorder), recurrent, severe, with psychosis ([place]) 11/29/2020  . GAD (generalized anxiety disorder) 07/01/2020  . Bipolar 1 disorder, mixed, moderate ([place]) 07/01/2020  . MDD (major depressive disorder), recurrent severe, without psychosis ([place]) 11/01/2019  . Major depressive  disorder, recurrent episode, severe ([place]) 11/13/2018  . Bipolar 1 disorder, depressed ([place]) 11/10/2018  . Seizures ([place]) 09/23/2018  . Disruption of episiotomy wound in the puerperium 09/04/2018  . Postpartum care following vaginal delivery 07/28/2018  . Encounter for IUD insertion 07/24/2018  . Gestational hypertension 06/18/2018  . Pap smear of cervix shows high risk HPV present 01/14/2018  . Supervision of high risk pregnancy, antepartum 01/08/2018  . Seizure disorder during pregnancy ([place]) 01/08/2018  . Advanced maternal age, primigravida 01/08/2018  . Bipolar 2 disorder ([place]) 01/08/2018  . Borderline personality disorder ([place]) 01/08/2018  . Major depression, chronic 01/08/2018  . PTSD (post-traumatic stress disorder) 01/08/2018  . OCD (obsessive compulsive disorder) 01/08/2018  . Asthma   . Seizure disorder ([place]) 11/06/2017  . Schizoaffective disorder, depressive type ([place]) 08/16/2017    Past Surgical History:  Procedure Laterality Date  . Brain Shunt  1981   "a few hours old"  . EYE SURGERY Bilateral 1985  . INTRAUTERINE DEVICE (IUD) INSERTION N/A 09/16/2018   Procedure: INTRAUTERINE DEVICE (IUD) INSERTION;  Surgeon: Lavonia Drafts, MD;  Location: [facility];  Service: Gynecology;  Laterality: N/A;  . PERINEUM REPAIR N/A 09/16/2018   Procedure: EPISIOTOMY REVISION;  Surgeon: Lavonia Drafts, MD;  Location: [facility];  Service: Gynecology;  Laterality: N/A;  . [place]  . Tooth Removal     multiple  OB History    Gravida  2   Para  1   Term  1   Preterm  0   AB  1   Living  1     SAB  1   IAB  0   Ectopic  0   Multiple  0   Live Births  1           Family History  Problem Relation Age of Onset  . Heart attack Mother   . Stroke Mother   . Liver cancer Mother   . Diabetes Mother   . Lung cancer Mother   . Alcoholism Father   . Sleep apnea Brother   . Depression Brother   . ADD / ADHD Brother   . Diabetes Maternal Aunt    . Diabetes Paternal Uncle   . Colon cancer Paternal Uncle   . Diabetes Maternal Grandmother   . Dementia Maternal Grandmother     Social History   Tobacco Use  . Smoking status: Current Every Day Smoker    Packs/day: 0.40    Years: 26.00    Pack years: 10.40    Types: Cigarettes  . Smokeless tobacco: Never Used  Vaping Use  . Vaping Use: Former  Substance Use Topics  . Alcohol use: Not Currently  . 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 Authorizing Provider  acetaminophen (TYLENOL) 325 MG tablet Take 650 mg by mouth every 6 (six) hours as needed for mild pain, fever or headache.    [provider]  ARIPiprazole (ABILIFY) 5 MG tablet Take 1 tablet (5 mg total) by mouth daily. 10/12/20   Salley Slaughter, NP  Ferrous Sulfate (IRON) 325 (65 Fe) MG TABS Take 1 tablet by mouth daily.    [provider]  hydrOXYzine (ATARAX/VISTARIL) 10 MG tablet Take 1 tablet (10 mg total) by mouth 3 (three) times daily as needed for anxiety. 10/12/20   Salley Slaughter, NP  levETIRAcetam (KEPPRA) 500 MG tablet Take 1 tablet (500 mg total) by mouth 2 (two) times daily. 07/07/20   Valarie Merino, MD  Multiple Vitamins-Minerals (WOMENS MULTIVITAMIN PO) Take 1 tablet by mouth daily.    [provider]  prazosin (MINIPRESS) 1 MG capsule Take 1 capsule (1 mg total) by mouth at bedtime. 10/12/20   Salley Slaughter, NP  traZODone (DESYREL) 50 MG tablet Take 1 tablet (50 mg total) by mouth at bedtime as needed for sleep. 10/12/20   Salley Slaughter, NP    Allergies    Aspartame and phenylalanine, Benadryl [diphenhydramine], Other, Scallops [shellfish allergy], Yellow jacket venom [bee venom], Pollen extract, Tegretol [carbamazepine], Adhesive [tape], and Latex  Review of Systems   Review of Systems  Constitutional: Negative for  chills and fever.  HENT: Negative.   Respiratory: Negative.   Cardiovascular: Negative.   Gastrointestinal: Negative.   Musculoskeletal: Negative.   Skin:       C/O pruritic foot rash  Neurological: Negative.   Psychiatric/Behavioral: Positive for hallucinations and suicidal ideas.    Physical Exam Updated Vital Signs BP 132/87 (BP Location: Left Arm)   Pulse 64   Temp 98.3 F (36.8 C) (Oral)   Resp 16   Ht 5\' 4"  (1.626 m)   Wt 81.6 kg   SpO2 100%   BMI 30.90 kg/m  Physical Exam Vitals and nursing note reviewed.  Constitutional:      Appearance: He is well-developed.  HENT:     Head: Normocephalic.  Cardiovascular:     Rate and Rhythm: Normal rate.  Pulmonary:     Effort: Pulmonary effort is normal.  Abdominal:     Palpations: Abdomen is soft.  Musculoskeletal:        General: Normal range of motion.     Cervical back: Normal range of motion and neck supple.  Skin:    General: Skin is warm and dry.     Comments: Scaling rash to feet bilaterally, minimal erythema, c/w tinea pedis  Neurological:     General: No focal deficit present.     Mental Status: He is alert and oriented to person, place, and time.  Psychiatric:        Attention and Perception: He perceives visual hallucinations.        Speech: Speech normal.        Behavior: Behavior is cooperative.        Thought Content: Thought content includes suicidal ideation.     ED Results / Procedures / Treatments   Labs (all labs ordered are listed, but only abnormal results are displayed) Labs Reviewed  SALICYLATE LEVEL - Abnormal; Notable for the following components:      Result Value   Salicylate Lvl <8.1 (*)    All other components within normal limits  ACETAMINOPHEN LEVEL - Abnormal; Notable for the following components:   Acetaminophen (Tylenol), Serum <10 (*)    All other components within normal limits  CBC - Abnormal; Notable for the following components:   WBC 12.0 (*)    Hemoglobin 15.7  (*)    HCT 47.3 (*)    All other components within normal limits  COMPREHENSIVE METABOLIC PANEL  ETHANOL  RAPID URINE DRUG SCREEN, HOSP PERFORMED  I-STAT BETA HCG BLOOD, ED ([facility], [facility], [facility] ONLY)    EKG None  Radiology No results found.  Procedures Procedures   Medications Ordered in ED Medications - No data to display  ED Course  I have reviewed the triage vital signs and the nursing notes.  Pertinent labs & imaging results that were available during my care of the patient were reviewed by me and considered in my medical decision making (see chart for details).    MDM Rules/Calculators/A&P                          Patient to ED with SI, VH, history of same. He is here voluntarily after seeing his therapist earlier today. Labs reviewed and he is considered medically cleared.   Chart reviewed. He was seen by [facility] prior to arrival in the ED and found to meet inpatient criteria. Here awaiting placement. TTS requested to follow to placement.    Final Clinical Impression(s) / ED Diagnoses Final diagnoses:  None   1. SI 2. Hallucinations  Rx / DC Orders ED Discharge Orders    None       Dennie Bible 11/30/20 [id number]    Drenda Freeze, MD 11/30/20 [phone]

## 2020-11-29 NOTE — ED Provider Notes (Signed)
[facility] Medical Screening Exam  Patient Name: Amanda [name] MRN: [id number] Date of Evaluation: 11/29/20 Chief Complaint:   Diagnosis:  Final diagnoses:  GAD (generalized anxiety disorder)  Borderline personality disorder ([place])  Obsessive-compulsive disorder, unspecified type  Schizoaffective disorder, depressive type ([place])  Major depression, chronic  PTSD (post-traumatic stress disorder)  MDD (major depressive disorder), recurrent, severe, with psychosis ([place])    History of Present illness: Amanda [name]  "Amanda [name]" is a 40 y.o. adult transgender female to female patient presented to [facility] after meeting with his psychiatric provider and making statements of worsening depression, auditory/visual hallucinations, and suicidal ideation.   [name], was seen face to face by this provider, consulted with Dr. Serafina Mitchell; and chart reviewed on 11/29/20.  On evaluation Amanda [name] reports she has worsening depression related to not having custody of her 31 yr old son and having difficulty finding a place to live so that he can regain custody.  Patient states he is currently homeless.  Recent break up with his girlfriend who told him "Until you put other people before your son nobody will ever love you.  I thought if you had children you were suppose to put them first."  Patient states that he is hearing voices telling him he is a failure to his 57 yr old son and telling him to kill himself.  Patient states he is seen everything turn to blood and shadow people with green eyes and knives.  Patient denies homicidal ideation and paranoia.  Patient states he has attempted suicide "over 100 times and I usually carry it out when I'm having thoughts."  Patient states the only thing that has kept him from trying to kill himself this time is "I just wanted to see my one more time over the Easter Holiday."  Patient asked how would his son feel when he found out he had killed himself and  patient replied "He is only 41 yrs old he probably won't even remember me."  Patient is unable to contract for safety.  Patient has outpatient psychiatric serves at [facility] and states he has been compliant with medication.  Patient has a history of seizures and states it has been a while since he has had a seizure and has been compliant with seizure and psychotropic medication.  Patient asked about recent dismissal from neurologist and he responded that he didn't even know he has been discharged until he had called office to see about a scheduled EEG and refills on Keppra.    During evaluation Amanda [name] is sitting laid back on bench chair in no acute distress.  He is alert, oriented x 4 and has remained calm and cooperative throughout assessment.  His mood is depressed with flat affect.  He does not appear to be responding to internal/external stimuli or delusional thoughts; but endorses auditory and visual hallucinations.  Patient denies homicidal ideation, and paranoia; but continues to endorse suicidal ideation with plan to hang self or overdose on medication.  Patient unable to contract for safety.  Recommending inpatient psychiatric treatment for patient but will need to be transferred to ED while awaiting available psychiatric bed.  Unable to admit to [facility] related to exclusion criteria of seizure disorder.     Psychiatric Specialty Exam  Presentation  General Appearance:Appropriate for Environment; Casual  Eye Contact:Good  Speech:Clear and Coherent; Normal Rate  Speech Volume:Normal  Handedness:Right   Mood and Affect  Mood:Depressed  Affect:Congruent; Depressed  Thought Process  Thought Processes:Coherent; Goal Directed  Descriptions of Associations:Intact  Orientation:Full (Time, Place and Person)  Thought Content:WDL    Hallucinations:Auditory; Visual Patient states he is hearing voices telling him "That I'm a failure to my 53 yr old son.  That I would be better off  dead." Patient states he is seeing "Every thing melts to blood, shadow people with glowing green eyes and knives"  Ideas of Reference:None  Suicidal Thoughts:Yes, Active With Intent; With Plan; With Means to Carry Out (Patient states he has a plan to overdose on his medicaton "I had multiple suicides.  I can just get my medicine that my brother is holding for me and overdose or hang myself with my dog leash.")  Homicidal Thoughts:No   Sensorium  Memory:Immediate Good; Recent Good; Remote Good  Judgment:Fair  Insight:Fair; Lacking   Executive Functions  Concentration:Good  Attention Span:Good  Painter  Language:Good   Psychomotor Activity  Psychomotor Activity:Normal   Assets  Assets:Communication Skills; Desire for Improvement   Sleep  Sleep:Fair  Number of hours: No data recorded  Nutritional Assessment (For OBS and FBC admissions only) Has the patient had a weight loss or gain of 10 pounds or more in the last 3 months?: No Has the patient had a decrease in food intake/or appetite?: No Does the patient have dental problems?: No Does the patient have eating habits or behaviors that may be indicators of an eating disorder including binging or inducing vomiting?: No Has the patient recently lost weight without trying?: No Has the patient been eating poorly because of a decreased appetite?: No Malnutrition Screening Tool Score: 0    Physical Exam: Physical Exam Vitals and nursing note reviewed. Exam conducted with a chaperone present.  Constitutional:      General: He is not in acute distress.    Appearance: Normal appearance. He is not ill-appearing.  HENT:     Head: Normocephalic.  Eyes:     Pupils: Pupils are equal, round, and reactive to light.  Cardiovascular:     Rate and Rhythm: Normal rate.  Pulmonary:     Effort: Pulmonary effort is normal.  Musculoskeletal:        General: Normal range of motion.     Cervical  back: Normal range of motion.  Skin:    General: Skin is warm and dry.  Neurological:     General: No focal deficit present.     Mental Status: He is alert and oriented to person, place, and time.  Psychiatric:        Attention and Perception: He perceives auditory and visual hallucinations.        Mood and Affect: Mood is depressed.        Speech: Speech normal.        Behavior: Behavior normal. Behavior is cooperative.        Thought Content: Thought content is not paranoid or delusional. Thought content includes suicidal ideation. Thought content does not include homicidal ideation.        Cognition and Memory: Cognition and memory normal.        Judgment: Judgment is impulsive.    Review of Systems  Constitutional: Negative.        Female to female transgender prefers to be called "[name]"  HENT: Negative.   Eyes: Negative.   Respiratory: Negative.   Cardiovascular: Negative.   Gastrointestinal: Negative.   Genitourinary: Negative.   Musculoskeletal: Negative.   Skin: Negative.   Neurological: Negative.  Endo/Heme/Allergies: Negative.   Psychiatric/Behavioral: Positive for depression, hallucinations (Auditory and visual) and suicidal ideas. Substance abuse: States he has been drug free for "almost 6 yrs now" The patient is nervous/anxious and has insomnia.        Patient reporting worsening depression and suicidal ideation with plan related to not having custody of his son and feeling he is failing his son, and recent break up with with her girlfriend.  Also difficult finding a place to live    Blood pressure 130/82, pulse 75, temperature 97.8 F (36.6 C), temperature source Temporal, resp. rate 16, height 5\' 4"  (1.626 m), weight 180 lb (81.6 kg), SpO2 98 %. Body mass index is 30.9 kg/m.  Musculoskeletal: Strength & Muscle Tone: within normal limits Gait & Station: normal Patient leans: N/A   [place] MSE Discharge Disposition for Follow up and Recommendations: Based on my  evaluation I certify that psychiatric inpatient services furnished can reasonably be expected to improve the patient's condition which I recommend transfer to an appropriate accepting facility.  Patient will be transferred to emergency room for medical clearance while awaiting appropriate bed for psychiatric admission.  Patient has history of seizure disorder which is exclusion criteria for admission to [facility] Continuous Assessment Unit.    Called [facility] ED and spoke with Dr. Quintella Reichert via telephone and informed:  Patient seen at [facility] as walk in and recommend for inpatient psychiatric treatment.  Report of patient assessment given and informed sending to ED while awaiting appropriate bed for psychiatric admission.  Unable to contract for safety will need inpatient psychiatric treatment.  Are there any beds at [facility] and use mood disorder bed.  Also sent a secure message with patient attached for review.        Keah Lamba, NP 11/29/2020, 5:30 PM

## 2020-11-30 ENCOUNTER — Encounter (HOSPITAL_COMMUNITY): Payer: Self-pay | Admitting: Emergency Medicine

## 2020-11-30 ENCOUNTER — Inpatient Hospital Stay (HOSPITAL_COMMUNITY)
Admission: AD | Admit: 2020-11-30 | Discharge: 2020-12-05 | DRG: 885 | Disposition: A | Discharge status: Home / Self Care | Payer: Medicare (Managed Care) | Source: Intra-hospital | Attending: Emergency Medicine | Admitting: Emergency Medicine

## 2020-11-30 DIAGNOSIS — F4312 Post-traumatic stress disorder, chronic: Secondary | ICD-10-CM | POA: Diagnosis present

## 2020-11-30 DIAGNOSIS — Z91030 Bee allergy status: Secondary | ICD-10-CM

## 2020-11-30 DIAGNOSIS — Z91013 Allergy to seafood: Secondary | ICD-10-CM | POA: Diagnosis not present

## 2020-11-30 DIAGNOSIS — Z801 Family history of malignant neoplasm of trachea, bronchus and lung: Secondary | ICD-10-CM | POA: Diagnosis not present

## 2020-11-30 DIAGNOSIS — Z91040 Latex allergy status: Secondary | ICD-10-CM

## 2020-11-30 DIAGNOSIS — F17210 Nicotine dependence, cigarettes, uncomplicated: Secondary | ICD-10-CM | POA: Diagnosis present

## 2020-11-30 DIAGNOSIS — Z9151 Personal history of suicidal behavior: Secondary | ICD-10-CM

## 2020-11-30 DIAGNOSIS — I10 Essential (primary) hypertension: Secondary | ICD-10-CM | POA: Diagnosis present

## 2020-11-30 DIAGNOSIS — Z5900 Homelessness unspecified: Secondary | ICD-10-CM | POA: Diagnosis not present

## 2020-11-30 DIAGNOSIS — Z8249 Family history of ischemic heart disease and other diseases of the circulatory system: Secondary | ICD-10-CM

## 2020-11-30 DIAGNOSIS — F251 Schizoaffective disorder, depressive type: Secondary | ICD-10-CM | POA: Diagnosis present

## 2020-11-30 DIAGNOSIS — F259 Schizoaffective disorder, unspecified: Secondary | ICD-10-CM | POA: Diagnosis present

## 2020-11-30 DIAGNOSIS — G40909 Epilepsy, unspecified, not intractable, without status epilepticus: Secondary | ICD-10-CM | POA: Diagnosis present

## 2020-11-30 DIAGNOSIS — Z818 Family history of other mental and behavioral disorders: Secondary | ICD-10-CM | POA: Diagnosis not present

## 2020-11-30 DIAGNOSIS — Z823 Family history of stroke: Secondary | ICD-10-CM

## 2020-11-30 DIAGNOSIS — Z811 Family history of alcohol abuse and dependence: Secondary | ICD-10-CM | POA: Diagnosis not present

## 2020-11-30 DIAGNOSIS — Z833 Family history of diabetes mellitus: Secondary | ICD-10-CM

## 2020-11-30 DIAGNOSIS — Z888 Allergy status to other drugs, medicaments and biological substances status: Secondary | ICD-10-CM

## 2020-11-30 DIAGNOSIS — R21 Rash and other nonspecific skin eruption: Secondary | ICD-10-CM | POA: Diagnosis not present

## 2020-11-30 LAB — RAPID URINE DRUG SCREEN, HOSP PERFORMED
Amphetamines: NOT DETECTED
Barbiturates: NOT DETECTED
Benzodiazepines: NOT DETECTED
Cocaine: NOT DETECTED
Opiates: NOT DETECTED
Tetrahydrocannabinol: NOT DETECTED

## 2020-11-30 LAB — RESP PANEL BY RT-PCR (FLU A&B, COVID) ARPGX2
Influenza A by PCR: NEGATIVE
Influenza B by PCR: NEGATIVE
SARS Coronavirus 2 by RT PCR: NEGATIVE

## 2020-11-30 MED ORDER — ADULT MULTIVITAMIN W/MINERALS CH: ORAL_TABLET | Freq: Every day | ORAL | Status: DC

## 2020-11-30 MED ORDER — PRAZOSIN HCL 1 MG PO CAPS: 1.0000 mg | ORAL_CAPSULE | Freq: Every day | ORAL | Status: DC

## 2020-11-30 MED ORDER — LEVETIRACETAM 500 MG PO TABS: 500.0000 mg | ORAL_TABLET | Freq: Two times a day (BID) | ORAL | Status: DC

## 2020-11-30 MED ORDER — TRAZODONE HCL 50 MG PO TABS: 50.0000 mg | ORAL_TABLET | Freq: Every evening | ORAL | Status: DC | PRN

## 2020-11-30 MED ORDER — ARIPIPRAZOLE 5 MG PO TABS
5.0000 mg | ORAL_TABLET | Freq: Every day | ORAL | Status: DC
  Filled 2020-11-30: qty 1

## 2020-11-30 MED ORDER — ARIPIPRAZOLE 5 MG PO TABS
5.0000 mg | ORAL_TABLET | Freq: Every day | ORAL | Status: DC
  Administered 2020-12-01: 5 mg via ORAL
  Filled 2020-11-30 (×3): qty 1

## 2020-11-30 MED ORDER — ALUM & MAG HYDROXIDE-SIMETH 200-200-20 MG/5ML PO SUSP: 30.0000 mL | ORAL | Status: DC | PRN

## 2020-11-30 MED ORDER — PRAZOSIN HCL 1 MG PO CAPS
1.0000 mg | ORAL_CAPSULE | Freq: Every day | ORAL | Status: DC
  Administered 2020-11-30 – 2020-12-02 (×3): 1 mg via ORAL
  Filled 2020-11-30 (×7): qty 1

## 2020-11-30 MED ORDER — HYDROXYZINE HCL 10 MG PO TABS
10.0000 mg | ORAL_TABLET | Freq: Three times a day (TID) | ORAL | Status: DC | PRN
  Administered 2020-11-30 – 2020-12-03 (×5): 10 mg via ORAL
  Filled 2020-11-30 (×5): qty 1
  Filled 2020-11-30: qty 10

## 2020-11-30 MED ORDER — LEVETIRACETAM 500 MG PO TABS
500.0000 mg | ORAL_TABLET | Freq: Two times a day (BID) | ORAL | Status: DC
  Administered 2020-11-30 – 2020-12-05 (×10): 500 mg via ORAL
  Filled 2020-11-30 (×2): qty 1
  Filled 2020-11-30: qty 14
  Filled 2020-11-30 (×5): qty 1
  Filled 2020-11-30: qty 14
  Filled 2020-11-30 (×2): qty 1
  Filled 2020-11-30: qty 14
  Filled 2020-11-30: qty 1
  Filled 2020-11-30: qty 14
  Filled 2020-11-30 (×2): qty 1

## 2020-11-30 MED ORDER — CLOTRIMAZOLE 1 % EX CREA
TOPICAL_CREAM | Freq: Two times a day (BID) | CUTANEOUS | Status: DC
  Administered 2020-12-02 – 2020-12-05 (×3): 1 via TOPICAL
  Filled 2020-11-30 (×2): qty 15

## 2020-11-30 MED ORDER — TRAZODONE HCL 50 MG PO TABS
50.0000 mg | ORAL_TABLET | Freq: Every evening | ORAL | Status: DC | PRN
  Administered 2020-11-30 – 2020-12-01 (×2): 50 mg via ORAL
  Filled 2020-11-30 (×2): qty 1

## 2020-11-30 MED ORDER — MAGNESIUM HYDROXIDE 400 MG/5ML PO SUSP
30.0000 mL | Freq: Every day | ORAL | Status: DC | PRN
  Administered 2020-12-03: 30 mL via ORAL
  Filled 2020-11-30: qty 30

## 2020-11-30 MED ORDER — HYDROXYZINE HCL 10 MG PO TABS
10.0000 mg | ORAL_TABLET | Freq: Three times a day (TID) | ORAL | Status: DC | PRN
  Filled 2020-11-30: qty 1

## 2020-11-30 MED ORDER — ACETAMINOPHEN 325 MG PO TABS
650.0000 mg | ORAL_TABLET | Freq: Four times a day (QID) | ORAL | Status: DC | PRN
  Administered 2020-12-05: 650 mg via ORAL
  Filled 2020-11-30: qty 2

## 2020-11-30 NOTE — ED Notes (Signed)
Pharmacy messaged about missing dose of lotrimin cream

## 2020-11-30 NOTE — Tx Team (Signed)
Initial Treatment Plan 11/30/2020 12:55 PM Amanda [name] AQV:[id number]    PATIENT STRESSORS: Financial difficulties Health problems Loss of relationship, son is in [place] custody Marital or family conflict   PATIENT STRENGTHS: Ability for insight Active sense of humor Capable of independent living Communication skills General fund of knowledge Physical Health Supportive family/friends   PATIENT IDENTIFIED PROBLEMS: Anxiety   depression  SI  No housing   Feels like pt. Failed son-2 y.o. son in [place] custody             DISCHARGE CRITERIA:  Ability to meet basic life and health needs Adequate post-discharge living arrangements Improved stabilization in mood, thinking, and/or behavior Motivation to continue treatment in a less acute level of care Reduction of life-threatening or endangering symptoms to within safe limits Safe-care adequate arrangements made  PRELIMINARY DISCHARGE PLAN: Attend aftercare/continuing care group Outpatient therapy Participate in family therapy Placement in alternative living arrangements  PATIENT/FAMILY INVOLVEMENT: This treatment plan has been presented to and reviewed with the patient, Amanda [name].The patient has been given the opportunity to ask questions and make suggestions.  Roxanna Mew, RN 11/30/2020, 12:55 PM

## 2020-11-30 NOTE — ED Notes (Signed)
Pt reports hearing voices telling her to harm her self.

## 2020-11-30 NOTE — ED Notes (Signed)
Pts belongings bags in locker 14. Pt also has belongings inventoried with security. Pt has a small octopus stuffed animal, wanded by security approved by charge for pt to keep this

## 2020-11-30 NOTE — ED Notes (Signed)
Pt transported to [facility] by safe transport

## 2020-11-30 NOTE — ED Notes (Signed)
Report called to [facility] and given to [place] ,[facility] called to transport pt

## 2020-11-30 NOTE — [facility] Group Notes (Signed)
Type of Therapy and Topic:  Group Therapy:  Positive Affirmations   Participation Level:  Active  Description of Group: This group addressed positive affirmation toward self and others. Patients went around the room and identified two positive things about themselves and two positive things about a peer in the room. Patients reflected on how it felt to share something positive with others, to identify positive things about themselves, and to hear positive things from others. Patients were encouraged to have a daily reflection of positive characteristics or circumstances. Therapeutic Goals 1. Patient will verbalize two of their positive qualities 2. Patient will demonstrate empathy for others by stating two positive qualities about a peer in the group 3. Patient will verbalize their feelings when voicing positive self affirmations and when voicing positive affirmations of others 4. Patients will discuss the potential positive impact on their wellness/recovery of focusing on positive traits of self and others.  Summary of Patient Progress:  Pt came to group and stated that 2 positive things about himself is that he is a decent parent and that he tries his best each day to set goals and achieve new things.  The Pt spent time talking with his peers and helping others find their own positive affirmations.

## 2020-11-30 NOTE — Progress Notes (Signed)
Patient under review at [facility].  Assunta Curtis, MSW, LCSW 11/30/2020 9:51 AM
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
## 2020-11-30 NOTE — Progress Notes (Signed)
Patient was accepted to [facility].    Meets inpatient criteria per Earleen Newport, NP.    Attending physician is Dr. Nelda Marseille.    Notified Maximino Greenland, RN of acceptance.  Nurses call report to [facility] [phone].   Patient can arrive 11/30/2020  Assunta Curtis, MSW, LCSW 11/30/2020 9:54 AM

## 2020-12-01 MED ORDER — FLUOXETINE HCL 20 MG PO CAPS
20.0000 mg | ORAL_CAPSULE | Freq: Every day | ORAL | Status: DC
  Administered 2020-12-01 – 2020-12-05 (×5): 20 mg via ORAL
  Filled 2020-12-01 (×8): qty 1
  Filled 2020-12-01 (×2): qty 7

## 2020-12-01 MED ORDER — ARIPIPRAZOLE 10 MG PO TABS
10.0000 mg | ORAL_TABLET | Freq: Every day | ORAL | Status: DC
  Administered 2020-12-02: 10 mg via ORAL
  Filled 2020-12-01 (×4): qty 1

## 2020-12-01 NOTE — Progress Notes (Signed)
   12/01/20 0324  Psych Admission Type (Psych Patients Only)  Admission Status Voluntary  Psychosocial Assessment  Patient Complaints Anxiety  Eye Contact Brief  Facial Expression Animated  Affect Anxious  Speech Logical/coherent  Interaction Assertive  Motor Activity Fidgety  Appearance/Hygiene Disheveled  Behavior Characteristics Cooperative  Mood Anxious  Thought Process  Coherency WDL  Content Blaming others  Delusions None reported or observed  Perception Hallucinations  Hallucination Command;Auditory;Visual  Judgment Poor  Confusion Mild  Danger to Self  Current suicidal ideation? Passive  Self-Injurious Behavior No self-injurious ideation or behavior indicators observed or expressed   Agreement Not to Harm Self Yes  Description of Agreement verbal  Danger to Others  Danger to Others None reported or observed

## 2020-12-01 NOTE — Progress Notes (Signed)
Patient stated he only slept a few hours, very tired this morning.  Ate 100% breakfast.  Self harm thoughts, bite inside mouth or fingers, contracts for safety.  Denied HI.  Always voices to kill or hurt himself.  Sees shadow people.  Will try to get rest this morning.
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
## 2020-12-01 NOTE — Progress Notes (Signed)
Psychoeducational Group Note  Date:  12/01/2020 Time: 2015  Group Topic/Focus:  wrap up group  Participation Level: Did Not Attend  Participation Quality:  Not Applicable  Affect:  Not Applicable  Cognitive:  Not Applicable  Insight:  Not Applicable  Engagement in Group: Not Applicable  Additional Comments:  Did not attend.  Shellia Cleverly 12/01/2020, 9:56 PM

## 2020-12-01 NOTE — Progress Notes (Addendum)
   12/01/20 0619  Vital Signs  Pulse Rate 91  BP 102/70  BP Location Left Arm  BP Method Automatic  Patient Position (if appropriate) Sitting   D: Patient admits to passive SI, denies HI. Pt admits to hearing some voices. Pt. Isolated in room. Pt rated both anxiety and depression 10/10. A:  Patient took scheduled medicine.  Support and encouragement provided Routine safety checks conducted every 15 minutes. Patient  Informed to notify staff with any concerns.   R: Safety maintained.
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
## 2020-12-02 MED ORDER — ARIPIPRAZOLE 15 MG PO TABS
15.0000 mg | ORAL_TABLET | Freq: Every day | ORAL | Status: DC
  Administered 2020-12-03 – 2020-12-05 (×3): 15 mg via ORAL
  Filled 2020-12-02: qty 7
  Filled 2020-12-02 (×4): qty 1
  Filled 2020-12-02: qty 7
  Filled 2020-12-02: qty 1

## 2020-12-02 MED ORDER — TRAZODONE HCL 100 MG PO TABS
100.0000 mg | ORAL_TABLET | Freq: Every day | ORAL | Status: DC
  Administered 2020-12-02 – 2020-12-04 (×3): 100 mg via ORAL
  Filled 2020-12-02 (×4): qty 1
  Filled 2020-12-02: qty 7
  Filled 2020-12-02 (×3): qty 1
  Filled 2020-12-02: qty 7

## 2020-12-02 MED ORDER — NICOTINE 21 MG/24HR TD PT24
21.0000 mg | MEDICATED_PATCH | Freq: Every day | TRANSDERMAL | Status: DC
  Administered 2020-12-02 – 2020-12-05 (×4): 21 mg via TRANSDERMAL
  Filled 2020-12-02 (×6): qty 1

## 2020-12-02 NOTE — Progress Notes (Signed)
Pt rates her anxiety, depression and hopelessness all 10/10. Reports being preoccupied about upcoming court date with DSS "I just don't know what's going to happen when I get out of here for this court date with DSS". Endorsed SI without a plan "I usually still feel like this for the first couple days when I'm hospitalized. It will get better". Denies HI, AVH and pain "not right now but the voices have always been there". Tolerates all medications and meals well without concerns. Attended scheduled groups and was engaged in activities. Goal for today is "Getting on track and figuring things out by talking to people".  All medication given as ordered with verbal education and effects monitored. Emotional support and encouragement provided to pt. Q 15 minutes safety checks maintained without incident.

## 2020-12-02 NOTE — Progress Notes (Signed)
   12/02/20 0100  Psych Admission Type (Psych Patients Only)  Admission Status Voluntary  Psychosocial Assessment  Patient Complaints Anxiety  Eye Contact Brief  Facial Expression Animated  Affect Anxious  Speech Logical/coherent  Interaction Assertive  Motor Activity Fidgety  Appearance/Hygiene Disheveled  Behavior Characteristics Cooperative  Mood Anxious  Thought Process  Coherency WDL  Content Blaming others  Delusions None reported or observed  Perception Hallucinations  Hallucination Command;Auditory;Visual  Judgment Poor  Confusion Mild  Danger to Self  Current suicidal ideation? Passive  Self-Injurious Behavior No self-injurious ideation or behavior indicators observed or expressed   Agreement Not to Harm Self Yes  Description of Agreement verbal  Danger to Others  Danger to Others None reported or observed

## 2020-12-02 NOTE — Tx Team (Signed)
Interdisciplinary Treatment and Diagnostic Plan Update  12/02/2020 Time of Session: [place] MRN: [id number]  Principal Diagnosis: Schizoaffective disorder, depressive type [facility])  Secondary Diagnoses: Principal Problem:   Schizoaffective disorder, depressive type ([place]) Active Problems:   Schizoaffective disorder ([place])   Current Medications:  Current Facility-Administered Medications  Medication Dose Route Frequency Provider Last Rate Last Admin  . acetaminophen (TYLENOL) tablet 650 mg  650 mg Oral Q6H PRN Money, Lowry Ram, FNP      . alum & mag hydroxide-simeth (MAALOX/MYLANTA) 200-200-20 MG/5ML suspension 30 mL  30 mL Oral Q4H PRN Money, Lowry Ram, FNP      . [START ON 12/03/2020] ARIPiprazole (ABILIFY) tablet 15 mg  15 mg Oral Daily Nwoko, Agnes I, NP      . clotrimazole (LOTRIMIN) 1 % cream   Topical BID Money, Lowry Ram, FNP   Given at 12/01/20 1633  . FLUoxetine (PROZAC) capsule 20 mg  20 mg Oral Daily Lindell Spar I, NP   20 mg at 12/02/20 6644  . hydrOXYzine (ATARAX/VISTARIL) tablet 10 mg  10 mg Oral TID PRN Money, Lowry Ram, FNP   10 mg at 12/02/20 0823  . levETIRAcetam (KEPPRA) tablet 500 mg  500 mg Oral BID Sharma Covert, MD   500 mg at 12/02/20 0817  . magnesium hydroxide (MILK OF MAGNESIA) suspension 30 mL  30 mL Oral Daily PRN Money, Darnelle Maffucci B, FNP      . nicotine (NICODERM CQ - dosed in mg/24 hours) patch 21 mg  21 mg Transdermal Daily Arthor Captain, MD   21 mg at 12/02/20 1102  . prazosin (MINIPRESS) capsule 1 mg  1 mg Oral QHS Money, Lowry Ram, FNP   1 mg at 12/01/20 2117  . traZODone (DESYREL) tablet 100 mg  100 mg Oral QHS Nwoko, Agnes I, NP       PTA Medications: Medications Prior to Admission  Medication Sig Dispense Refill Last Dose  . acetaminophen (TYLENOL) 325 MG tablet Take 650 mg by mouth every 6 (six) hours as needed for mild pain, fever or headache.     . ARIPiprazole (ABILIFY) 5 MG tablet Take 1 tablet (5 mg total) by mouth daily. 30 tablet 2    . ARIPiprazole (ABILIFY) 5 MG tablet TAKE 1 TABLET (5 MG TOTAL) BY MOUTH DAILY. 30 tablet 2   . Ferrous Sulfate (IRON) 325 (65 Fe) MG TABS Take 1 tablet by mouth daily.     . hydrOXYzine (ATARAX/VISTARIL) 10 MG tablet Take 1 tablet (10 mg total) by mouth 3 (three) times daily as needed for anxiety. 90 tablet 2   . hydrOXYzine (ATARAX/VISTARIL) 10 MG tablet TAKE 1 TABLET (10 MG TOTAL) BY MOUTH 3 (THREE) TIMES DAILY AS NEEDED FOR ANXIETY. 90 tablet 2   . levETIRAcetam (KEPPRA) 500 MG tablet Take 1 tablet (500 mg total) by mouth 2 (two) times daily. 60 tablet 2   . Multiple Vitamins-Minerals (WOMENS MULTIVITAMIN PO) Take 1 tablet by mouth daily.     . prazosin (MINIPRESS) 1 MG capsule Take 1 capsule (1 mg total) by mouth at bedtime. 30 capsule 2   . prazosin (MINIPRESS) 1 MG capsule TAKE 1 CAPSULE (1 MG TOTAL) BY MOUTH AT BEDTIME. 30 capsule 2   . traZODone (DESYREL) 50 MG tablet Take 1 tablet (50 mg total) by mouth at bedtime as needed for sleep. 30 tablet 2   . traZODone (DESYREL) 50 MG tablet TAKE 1 TABLET (50 MG TOTAL) BY MOUTH AT BEDTIME AS NEEDED  FOR SLEEP. 30 tablet 2     Patient Stressors: Financial difficulties Health problems Loss of relationship, son is in [place] custody Marital or family conflict  Patient Strengths: Ability for insight Active sense of humor Capable of independent living Communication skills General fund of knowledge Physical Health Supportive family/friends  Treatment Modalities: Medication Management, Group therapy, Case management,  1 to 1 session with clinician, Psychoeducation, Recreational therapy.   Physician Treatment Plan for Primary Diagnosis: Schizoaffective disorder, depressive type ([place]) Long Term Goal(s): Improvement in symptoms so as ready for discharge Improvement in symptoms so as ready for discharge   Short Term Goals: Ability to identify changes in lifestyle to reduce recurrence of condition will improve Ability to verbalize feelings will  improve Ability to disclose and discuss suicidal ideas Ability to demonstrate self-control will improve Ability to identify and develop effective coping behaviors will improve Compliance with prescribed medications will improve  Medication Management: Evaluate patient's response, side effects, and tolerance of medication regimen.  Therapeutic Interventions: 1 to 1 sessions, Unit Group sessions and Medication administration.  Evaluation of Outcomes: Progressing  Physician Treatment Plan for Secondary Diagnosis: Principal Problem:   Schizoaffective disorder, depressive type ([place]) Active Problems:   Schizoaffective disorder ([place])  Long Term Goal(s): Improvement in symptoms so as ready for discharge Improvement in symptoms so as ready for discharge   Short Term Goals: Ability to identify changes in lifestyle to reduce recurrence of condition will improve Ability to verbalize feelings will improve Ability to disclose and discuss suicidal ideas Ability to demonstrate self-control will improve Ability to identify and develop effective coping behaviors will improve Compliance with prescribed medications will improve     Medication Management: Evaluate patient's response, side effects, and tolerance of medication regimen.  Therapeutic Interventions: 1 to 1 sessions, Unit Group sessions and Medication administration.  Evaluation of Outcomes: Progressing   RN Treatment Plan for Primary Diagnosis: Schizoaffective disorder, depressive type ([place]) Long Term Goal(s): Knowledge of disease and therapeutic regimen to maintain health will improve  Short Term Goals: Ability to remain free from injury will improve, Ability to verbalize frustration and anger appropriately will improve, Ability to disclose and discuss suicidal ideas, Ability to identify and develop effective coping behaviors will improve and Compliance with prescribed medications will improve  Medication Management: RN will administer  medications as ordered by provider, will assess and evaluate patient's response and provide education to patient for prescribed medication. RN will report any adverse and/or side effects to prescribing provider.  Therapeutic Interventions: 1 on 1 counseling sessions, Psychoeducation, Medication administration, Evaluate responses to treatment, Monitor vital signs and CBGs as ordered, Perform/monitor CIWA, COWS, AIMS and Fall Risk screenings as ordered, Perform wound care treatments as ordered.  Evaluation of Outcomes: Progressing   LCSW Treatment Plan for Primary Diagnosis: Schizoaffective disorder, depressive type ([place]) Long Term Goal(s): Safe transition to appropriate next level of care at discharge, Engage patient in therapeutic group addressing interpersonal concerns.  Short Term Goals: Engage patient in aftercare planning with referrals and resources, Increase ability to appropriately verbalize feelings, Increase emotional regulation and Increase skills for wellness and recovery  Therapeutic Interventions: Assess for all discharge needs, 1 to 1 time with Social worker, Explore available resources and support systems, Assess for adequacy in community support network, Educate family and significant other(s) on suicide prevention, Complete Psychosocial Assessment, Interpersonal group therapy.  Evaluation of Outcomes: Progressing   Progress in Treatment: Attending groups: Yes. Participating in groups: Yes. Taking medication as prescribed: Yes. Toleration medication: Yes. Family/Significant  other contact made: No, will contact:  sister. Patient understands diagnosis: Yes. Discussing patient identified problems/goals with staff: Yes. Medical problems stabilized or resolved: Yes. Denies suicidal/homicidal ideation: No. Issues/concerns per patient self-inventory: No. Other: N/A  New problem(s) identified: No, Describe:  none noted.  New Short Term/Long Term Goal(s): Safe transition to  appropriate next level of care at discharge, Engage patient in therapeutic group addressing interpersonal concerns.  Patient Goals:  "To stop the auditory and visual hallucinations"  Discharge Plan or Barriers: Pt to follow up with recommended level of care and medication management services.  Reason for Continuation of Hospitalization: Anxiety Hallucinations Medication stabilization Suicidal ideation  Estimated Length of Stay: 3-5 days  Attendees: Patient: Amanda [name] 12/02/2020 3:56 PM  Physician: Dr. Jeneen Rinks, MD 12/02/2020 3:56 PM  Nursing:  12/02/2020 3:56 PM  RN Care Manager: 12/02/2020 3:56 PM  Social Worker: Sherren Mocha, LCSW 12/02/2020 3:56 PM  Recreational Therapist:  12/02/2020 3:56 PM  Other:  12/02/2020 3:56 PM  Other:  12/02/2020 3:56 PM  Other: 12/02/2020 3:56 PM    Scribe for Treatment Team: Blane Ohara, LCSW 12/02/2020 3:56 PM

## 2020-12-02 NOTE — Progress Notes (Signed)
   12/02/20 2200  COVID-19 Daily Checkoff  Have you had a fever (temp > 37.80C/100F)  in the past 24 hours?  No  If you have had runny nose, nasal congestion, sneezing in the past 24 hours, has it worsened? No  COVID-19 EXPOSURE  Have you traveled outside the state in the past 14 days? No  Have you been in contact with someone with a confirmed diagnosis of COVID-19 or PUI in the past 14 days without wearing appropriate PPE? No  Have you been living in the same home as a person with confirmed diagnosis of COVID-19 or a PUI (household contact)? No  Have you been diagnosed with COVID-19? No

## 2020-12-02 NOTE — Progress Notes (Signed)
Recreation Therapy Notes  Date: 4.15.22 Time: 0930 Location: [address] Dayroom  Group Topic: Stress Management   Goal Area(s) Addresses:  Patient will actively participate in stress management techniques presented during session.  Patient will successfully identify benefit of practicing stress management post d/c.   Behavioral Response: Appropriate  Intervention: Guided exercise with ambient sound and script  Activity :Guided Imagery  LRT read a script that focused on sitting peaceful at the beach and listening to the waves as they moved in and out at the shore. Patients were to listen and follow along as script was read to fully engage in activity.  Education:  Stress Management, Discharge Planning.   Education Outcome: Acknowledges education  Clinical Observations/Feedback: Patient actively engaged in technique introduced, expressed no concerns.    Victorino Sparrow, LRT/CTRS         Victorino Sparrow A 12/02/2020 10:39 AM

## 2020-12-02 NOTE — Progress Notes (Signed)
   12/02/20 2210  Psych Admission Type (Psych Patients Only)  Admission Status Voluntary  Psychosocial Assessment  Patient Complaints Anxiety;Worrying  Eye Contact Fair  Facial Expression Animated  Affect Appropriate to circumstance  Theatre stage manager;Restless  Appearance/Hygiene Disheveled  Behavior Characteristics Cooperative;Appropriate to situation  Mood Pleasant;Preoccupied;Anxious  Thought Process  Coherency WDL  Content Blaming others  Delusions None reported or observed  Perception Hallucinations  Hallucination Command;Auditory;Visual  Judgment Poor  Confusion Mild  Danger to Self  Current suicidal ideation? Passive  Self-Injurious Behavior No self-injurious ideation or behavior indicators observed or expressed   Agreement Not to Harm Self Yes  Description of Agreement Pt verbally contracts for safety  Danger to Others  Danger to Others None reported or observed

## 2020-12-02 NOTE — Progress Notes (Addendum)
[facility] MD Progress Note  12/02/2020 3:30 PM Amanda [name]  MRN:  [id number]  Subjective: Amanda [name] Amanda [name] reports), "I feel real depressed. My anxiety is real high today. I'm still feeling like hurting myself. I feel like I'm failing my son. I feel like I'm screwing his life".  Objective: 40 year old Transgender-female. [name] likes to be called 'Amanda [name]'. [facility] 23 through March 26th for mood stabilization treatments. At the time, she reported that she was dealing with the removal of her newborn son by the cPS due to poor living condition. Daily notes: Amanda [name] 'Amanda [name]' is seen, chart reviewed. The chart findings discussed with the treatment staff. He is sitting on top of her bed, presenting with a flat affect. He is making a fair eye contact. He says she is still depressed & feeling anxious. He expressed a lot of guilt about not having the custody of his 46 year old son. He says he saw him last Tuesday. He is also saying he is not sure if he is already in trouble for not sending in his co-parenting journal he was suppose to sent to the foster parent, her lawyer & her DSS case worker. She did says she has called her DSS case worker to inform her his where about in the last few days. He complained of low energy being the reasons that she is sleeping a lot during the day & not much during the night. But documentation indicated that Amanda [name] slept for about 6.5 hrs last night. He continues to endorse SI, AH, denies any HI, VH, delusional thoughts or paranoia. He does not appear to be responding to any internal stimuli. This provider did make some dose adjustments to the Abilify & Trazodone to aid improve mood & sleep. Patient is encouraged to come out of his room & attend group sessions. He is in agreement to continue current plan of care as already in progress..  Principal Problem: Schizoaffective disorder, depressive type ([place])   Diagnosis: Principal Problem:   Schizoaffective disorder,  depressive type ([place]) Active Problems:   Schizoaffective disorder ([place])  Total Time spent with patient: Greater than 30 minutes  Past Psychiatric History: See H&P  Past Medical History:  Past Medical History:  Diagnosis Date  . Anxiety   . Asthma   . Asthma due to environmental allergies   . Asthma due to seasonal allergies   . Bipolar 1 disorder ([place])   . Borderline personality disorder ([place])   . Brain bleed ([place])   . Chronic post-traumatic stress disorder (PTSD)    complex chronic with psychotic features and self harm behaviors  . Complication of anesthesia   . Constipation   . Dander (animal) allergy   . Hearing loss    right ear  . Heart murmur    denies seeing a cardiologist  . Hip dysplasia   . Hypertension    Gestional   . Major depression, chronic   . Mood disorder ([place])   . OCD (obsessive compulsive disorder)   . Pneumonia   . PONV (postoperative nausea and vomiting)   . PTSD (post-traumatic stress disorder)   . RA (rheumatoid arthritis) ([place])   . Rheumatoid arthritis ([place])   . Schizophrenia ([place])    "I think that is wrong"  . Seizure disorder ([place])   . Suicidal ideations   . Suicide attempt [facility])     Past Surgical History:  Procedure Laterality Date  . Brain Shunt  1981   "  a few hours old"  . EYE SURGERY Bilateral 1985  . INTRAUTERINE DEVICE (IUD) INSERTION N/A 09/16/2018   Procedure: INTRAUTERINE DEVICE (IUD) INSERTION;  Surgeon: Lavonia Drafts, MD;  Location: [facility];  Service: Gynecology;  Laterality: N/A;  . PERINEUM REPAIR N/A 09/16/2018   Procedure: EPISIOTOMY REVISION;  Surgeon: Lavonia Drafts, MD;  Location: [facility];  Service: Gynecology;  Laterality: N/A;  . [place]  . Tooth Removal     multiple   Family History:  Family History  Problem Relation Age of Onset  . Heart attack Mother   . Stroke Mother   . Liver cancer Mother   . Diabetes Mother   . Lung cancer Mother   . Alcoholism Father   . Sleep apnea  Brother   . Depression Brother   . ADD / ADHD Brother   . Diabetes Maternal Aunt   . Diabetes Paternal Uncle   . Colon cancer Paternal Uncle   . Diabetes Maternal Grandmother   . Dementia Maternal Grandmother    Family Psychiatric  History: See H&P Social History:  Social History   Substance and Sexual Activity  Alcohol Use Not Currently     Social History   Substance and Sexual Activity  Drug Use Not Currently  . 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 Marital status: Significant Other    Spouse name: Not on file  . Number of children: 1  . Years of education: 36  . Highest education level: 12th grade  Occupational History  . Not on file  Tobacco Use  . Smoking status: Current Every Day Smoker    Packs/day: 0.40    Years: 26.00    Pack years: 10.40    Types: Cigarettes  . Smokeless tobacco: Never Used  Vaping Use  . Vaping Use: Former  Substance and Sexual Activity  . Alcohol use: Not Currently  . Drug use: Not Currently    Types: Marijuana    Comment: sober from from drugs   . Sexual activity: Not Currently    Birth control/protection: None  Other Topics Concern  . Not on file  Social History Narrative   ** Merged History Encounter **       Lives with her fiance, friend, and fiance's brother   Right handed   Social Determinants of Health   Financial Resource Strain: Low Risk   . Difficulty of Paying Living Expenses: Not hard at all  Food Insecurity: No Food Insecurity  . Worried About Charity fundraiser in the Last Year: Never true  . Ran Out of Food in the Last Year: Never true  Transportation Needs: Unmet Transportation Needs  . Lack of Transportation (Medical): Yes  . Lack of Transportation (Non-Medical): Yes  Physical Activity: Insufficiently Active  . Days of Exercise per Week: 3 days  . Minutes of Exercise per Session: 40 min  Stress: No Stress Concern Present  . Feeling of Stress :  Not at all  Social Connections: Moderately Integrated  . Frequency of Communication with Friends and Family: Three times a week  . Frequency of Social Gatherings with Friends and Family: More than three times a week  . Attends Religious Services: Never  . Active Member of Clubs or Organizations: Yes  . Attends Archivist Meetings: More than 4 times per year  . Marital Status: Married   Additional Social History:   Sleep: Poor", documentation  indicated patient slept 6.5 hrs.  Appetite:  Good  Current Medications: Current Facility-Administered Medications  Medication Dose Route Frequency Provider Last Rate Last Admin  . acetaminophen (TYLENOL) tablet 650 mg  650 mg Oral Q6H PRN Money, Lowry Ram, FNP      . alum & mag hydroxide-simeth (MAALOX/MYLANTA) 200-200-20 MG/5ML suspension 30 mL  30 mL Oral Q4H PRN Money, Lowry Ram, FNP      . ARIPiprazole (ABILIFY) tablet 10 mg  10 mg Oral Daily Lindell Spar I, NP   10 mg at 12/02/20 0817  . clotrimazole (LOTRIMIN) 1 % cream   Topical BID Money, Lowry Ram, FNP   Given at 12/01/20 1633  . FLUoxetine (PROZAC) capsule 20 mg  20 mg Oral Daily Lindell Spar I, NP   20 mg at 12/02/20 0254  . hydrOXYzine (ATARAX/VISTARIL) tablet 10 mg  10 mg Oral TID PRN Money, Lowry Ram, FNP   10 mg at 12/02/20 0823  . levETIRAcetam (KEPPRA) tablet 500 mg  500 mg Oral BID Sharma Covert, MD   500 mg at 12/02/20 0817  . magnesium hydroxide (MILK OF MAGNESIA) suspension 30 mL  30 mL Oral Daily PRN Money, Darnelle Maffucci B, FNP      . nicotine (NICODERM CQ - dosed in mg/24 hours) patch 21 mg  21 mg Transdermal Daily Arthor Captain, MD   21 mg at 12/02/20 1102  . prazosin (MINIPRESS) capsule 1 mg  1 mg Oral QHS Money, Lowry Ram, FNP   1 mg at 12/01/20 2117  . traZODone (DESYREL) tablet 50 mg  50 mg Oral QHS PRN Money, Lowry Ram, FNP   50 mg at 12/01/20 2117   Lab Results: No results found for this or any previous visit (from the past 48 hour(s)).  Blood Alcohol level:  Lab  Results  Component Value Date   ETH <10 11/29/2020   ETH <10 27/01/2375   Metabolic Disorder Labs: Lab Results  Component Value Date   HGBA1C 5.3 11/11/2018   MPG 105.41 11/11/2018   Lab Results  Component Value Date   PROLACTIN 36.9 (H) 11/11/2018   No results found for: CHOL, TRIG, HDL, CHOLHDL, VLDL, LDLCALC  Physical Findings: AIMS:  , ,  ,  ,    CIWA:    COWS:     Musculoskeletal: Strength & Muscle Tone: within normal limits Gait & Station: normal Patient leans: N/A  Psychiatric Specialty Exam:  Presentation: Psychiatric Specialty Exam: Physical Exam Vitals and nursing note reviewed.  HENT:     Head: Normocephalic.     Nose: Nose normal.     Mouth/Throat:     Pharynx: Oropharynx is clear.  Cardiovascular:     Rate and Rhythm: Normal rate.  Pulmonary:     Effort: Pulmonary effort is normal.  Genitourinary:    Comments: Deferred Musculoskeletal:        General: Normal range of motion.     Cervical back: Normal range of motion.  Skin:    General: Skin is warm and dry.  Neurological:     General: No focal deficit present.     Mental Status: He is alert and oriented to person, place, and time.     Review of Systems  Constitutional: Negative.   HENT: Negative.   Eyes: Negative.   Respiratory: Negative.   Cardiovascular: Negative.   Gastrointestinal: Negative.   Genitourinary: Negative.   Musculoskeletal: Negative.   Skin: Negative.   Neurological: Negative.   Hematological:  Allergies: See lists please.  Psychiatric/Behavioral: Positive for depression, hallucinations, substance abuse and suicidal ideas. Negative for memory loss. The patient is nervous/anxious and has insomnia.     Blood pressure 132/85, pulse 87, temperature 98 F (36.7 C), temperature source Oral, resp. rate 18, height 5\' 6"  (1.676 m), weight 79.4 kg, SpO2 99 %.Body mass index is 28.25 kg/m.  General Appearance: Disheveled  Eye Contact: Fair  Speech:  Clear and Coherent  and Normal Rate  Volume:  Normal  Mood:  Anxious, Depressed  Affect:  Congruent and Depressed  Thought Process:  Coherent, Goal Directed and Descriptions of Associations: Intact  Orientation:  Full (Time, Place, and Person)  Thought Content:  Logical and Hallucinations: Auditory Visual. "I'm hearing voices, seeing shaow people with bulging green eyes".  Suicidal Thoughts: yes, denies any SI, plan or intent.  Homicidal Thoughts:  Denies  Memory:  Immediate;   Fair Recent;   Fair Remote;   Fair  Judgement:  Fair  Insight:  Lacking  Psychomotor Activity:  Normal, reports feeling very anxious.  Concentration:  Concentration: Fair and Attention Span: Fair  Recall:  [employer] of Knowledge:  Fair  Language:  Good  Akathisia:  Negative  Handed:  Right  AIMS (if indicated):     Assets:  Communication Skills Desire for Improvement Physical Health Resilience  ADL's:  Intact  Cognition:  WNL  Sleep:  Number of Hours: 6.75    Sleep:  Number of Hours: 6.5   Physical Exam: Physical Exam Vitals and nursing note reviewed.  HENT:     Head: Normocephalic.     Nose: Nose normal.     Mouth/Throat:     Pharynx: Oropharynx is clear.  Cardiovascular:     Rate and Rhythm: Normal rate.  Pulmonary:     Effort: Pulmonary effort is normal.  Genitourinary:    Comments: Deferred Musculoskeletal:        General: Normal range of motion.     Cervical back: Normal range of motion.  Skin:    General: Skin is warm and dry.  Neurological:     General: No focal deficit present.     Mental Status: He is alert and oriented to person, place, and time.    Review of Systems  Constitutional: Negative.   HENT: Negative.   Eyes: Negative.   Respiratory: Negative.   Cardiovascular: Negative.   Gastrointestinal: Negative.   Genitourinary: Negative.   Musculoskeletal: Negative.   Skin: Negative.   Neurological: Negative.   Endo/Heme/Allergies:       Allergies: See lists please.   Psychiatric/Behavioral: Positive for depression, hallucinations, substance abuse and suicidal ideas. Negative for memory loss. The patient is nervous/anxious and has insomnia.    Blood pressure 132/85, pulse 87, temperature 98 F (36.7 C), temperature source Oral, resp. rate 18, height 5\' 6"  (1.676 m), weight 79.4 kg, SpO2 99 %. Body mass index is 28.25 kg/m.  Treatment Plan Summary: Daily contact with patient to assess and evaluate symptoms and progress in treatment and Medication management.  Continue inpatient hospitalization. Will continue today 12/02/2020 plan as below except where it is noted.  Mood control. Increased Abilify from 10 mg to Abilify 15 mg po daily .  Depression. Continue Fluoxetine 20 mg po daily.  Anxiety. Continue Vistaril 25 mg po tid prn.  PTSD/nightmares. Continue Minipress 1 mg po Q hs.  Insomnia.  Increased Trazodone from 50 mg prn to Trazodone 100 mg po Q hs routine.  Seizure disorder. Continue Keppra 500  mg po bid.  Continue all other prn medications for anxiety, pain, insomnia etc. Encourage group participation. Discharge disposition plan ongoing.  Lindell Spar, NP, PMHNP, FNP-BC 12/02/2020, 3:30 PM

## 2020-12-03 DIAGNOSIS — F251 Schizoaffective disorder, depressive type: Secondary | ICD-10-CM | POA: Diagnosis not present

## 2020-12-03 LAB — LIPID PANEL
Cholesterol: 180 mg/dL (ref 0–200)
HDL: 43 mg/dL (ref >40)
LDL Cholesterol: 108 mg/dL — ABNORMAL HIGH (ref 0–99)
Total CHOL/HDL Ratio: 4.2 RATIO
Triglycerides: 147 mg/dL (ref <150)
VLDL: 29 mg/dL (ref 0–40)

## 2020-12-03 MED ORDER — MAGNESIUM CITRATE PO SOLN
1.0000 | Freq: Once | ORAL | Status: AC
  Administered 2020-12-03: 1 via ORAL
  Filled 2020-12-03 (×2): qty 296

## 2020-12-03 NOTE — Progress Notes (Addendum)
Pt went off unit for lunch and dinner this shift, report relief from dizziness after resting in bed this morning. Observed in milieu interacting well with peers and staff. Pt did not attend scheduled morning group due to complain of dizziness. Received MOM for complain of constipation without effect. Denies SI, HI, AVH and pain at this time. Pt continues to be preoccupied about DSS child custody issues and phone calls on interactions today. Tolerates meals and medications well without concerns.  Scheduled and PRN medications given as ordered. Q 15 minutes safety checks maintained. Emotional support offered. Writer encouraged pt to voice concerns. Dr. Nelda Marseille made aware of pt's constipation, order pending for Mag citrate.  Pt reported no result with MOM "I was passed gas but no BM. I do have chronic constipation, I was born preterm and I have always struggled with it. Pt remains safe on and off unit.

## 2020-12-03 NOTE — Progress Notes (Signed)
[facility] MD Progress Note  12/03/2020 6:34 PM Amanda [name]  MRN:  [id number]  Chief Complaint: suicidal ideation  Subjective:The patient is a 40 year old transgender female with a history of schizoaffective d/o with multiple prior inpatient psychiatric admissions and multiple prior suicide attempts as well as nonsuicidal self-injurious behavior.  The patient was admitted from [facility] to [facility] with complaints of worsening depression, auditory and visual hallucinations and suicidal ideation with symptoms worsening in the context of break-up with girlfriend, not having custody of his son and trouble finding a place to live as well as financial stressors.The patient is currently on Hospital Day 3.   Chart Review from last 24 hours:  The patient's chart was reviewed and nursing notes were reviewed. The patient's case was discussed in multidisciplinary team meeting. Per [facility] patient was compliant with scheduled medications except refusal of Lotrimin cream for morning doses. He received Vistaril X1 for anxiety.  Information Obtained Today During Patient Interview: The patient was seen and evaluated on the unit. On assessment today the patient reports that he has been sleeping and eating well. He denies SI, HI, AVH, paranoia, ideas of reference, or first rank symptoms. He states he has had some dizziness in the mornings while on Trazodone and Prazosin and we discussed holding Prazosin since he states his nightmares have not recently been bothersome. He feels he needs Trazodone for sleep. He reports some emesis this morning and reports N/V has resolved. He voices no other physical complaints. He spent time discussing plans after discharge and hope to get custody back of his son in the future. He states he found out that his ex-girlfriend is moving back to [place] which he finds reassuring for discharge plans.   Principal Problem: Schizoaffective disorder, depressive type ([place])  Diagnosis: Principal Problem:    Schizoaffective disorder, depressive type ([place]) Active Problems:   Schizoaffective disorder ([place])  Total Time Spent in Direct Patient Care:  I personally spent 30 minutes on the unit in direct patient care. The direct patient care time included face-to-face time with the patient, reviewing the patient's chart, communicating with other professionals, and coordinating care. Greater than 50% of this time was spent in counseling or coordinating care with the patient regarding goals of hospitalization, psycho-education, and discharge planning needs.  Past Psychiatric History: See H&P  Past Medical History:  Past Medical History:  Diagnosis Date  . Anxiety   . Asthma   . Asthma due to environmental allergies   . Asthma due to seasonal allergies   . Bipolar 1 disorder ([place])   . Borderline personality disorder ([place])   . Brain bleed ([place])   . Chronic post-traumatic stress disorder (PTSD)    complex chronic with psychotic features and self harm behaviors  . Complication of anesthesia   . Constipation   . Dander (animal) allergy   . Hearing loss    right ear  . Heart murmur    denies seeing a cardiologist  . Hip dysplasia   . Hypertension    Gestional   . Major depression, chronic   . Mood disorder ([place])   . OCD (obsessive compulsive disorder)   . Pneumonia   . PONV (postoperative nausea and vomiting)   . PTSD (post-traumatic stress disorder)   . RA (rheumatoid arthritis) ([place])   . Rheumatoid arthritis ([place])   . Schizophrenia ([place])    "I think that is wrong"  . Seizure disorder ([place])   . Suicidal ideations   . Suicide attempt ([place])  Past Surgical History:  Procedure Laterality Date  . Brain Shunt  1981   "a few hours old"  . EYE SURGERY Bilateral 1985  . INTRAUTERINE DEVICE (IUD) INSERTION N/A 09/16/2018   Procedure: INTRAUTERINE DEVICE (IUD) INSERTION;  Surgeon: Lavonia Drafts, MD;  Location: [facility];  Service: Gynecology;  Laterality: N/A;  . PERINEUM REPAIR N/A  09/16/2018   Procedure: EPISIOTOMY REVISION;  Surgeon: Lavonia Drafts, MD;  Location: [facility];  Service: Gynecology;  Laterality: N/A;  . [place]  . Tooth Removal     multiple   Family History:  Family History  Problem Relation Age of Onset  . Heart attack Mother   . Stroke Mother   . Liver cancer Mother   . Diabetes Mother   . Lung cancer Mother   . Alcoholism Father   . Sleep apnea Brother   . Depression Brother   . ADD / ADHD Brother   . Diabetes Maternal Aunt   . Diabetes Paternal Uncle   . Colon cancer Paternal Uncle   . Diabetes Maternal Grandmother   . Dementia Maternal Grandmother    Family Psychiatric  History: See H&P Social History:  Social History   Substance and Sexual Activity  Alcohol Use Not Currently     Social History   Substance and Sexual Activity  Drug Use Not Currently  . 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 Marital status: Significant Other    Spouse name: Not on file  . Number of children: 1  . Years of education: 58  . Highest education level: 12th grade  Occupational History  . Not on file  Tobacco Use  . Smoking status: Current Every Day Smoker    Packs/day: 0.40    Years: 26.00    Pack years: 10.40    Types: Cigarettes  . Smokeless tobacco: Never Used  Vaping Use  . Vaping Use: Former  Substance and Sexual Activity  . Alcohol use: Not Currently  . Drug use: Not Currently    Types: Marijuana    Comment: sober from from drugs   . Sexual activity: Not Currently    Birth control/protection: None  Other Topics Concern  . Not on file  Social History Narrative   ** Merged History Encounter **       Lives with her fiance, friend, and fiance's brother   Right handed   Social Determinants of Health   Financial Resource Strain: Low Risk   . Difficulty of Paying Living Expenses: Not hard at all  Food Insecurity: No Food Insecurity  . Worried About  Charity fundraiser in the Last Year: Never true  . Ran Out of Food in the Last Year: Never true  Transportation Needs: Unmet Transportation Needs  . Lack of Transportation (Medical): Yes  . Lack of Transportation (Non-Medical): Yes  Physical Activity: Insufficiently Active  . Days of Exercise per Week: 3 days  . Minutes of Exercise per Session: 40 min  Stress: No Stress Concern Present  . Feeling of Stress : Not at all  Social Connections: Moderately Integrated  . Frequency of Communication with Friends and Family: Three times a week  . Frequency of Social Gatherings with Friends and Family: More than three times a week  . Attends Religious Services: Never  . Active Member of Clubs or Organizations: Yes  . Attends Archivist Meetings: More than 4 times per year  .  Marital Status: Married   Additional Social History:   Sleep: Good  Appetite:  Good  Current Medications: Current Facility-Administered Medications  Medication Dose Route Frequency Provider Last Rate Last Admin  . acetaminophen (TYLENOL) tablet 650 mg  650 mg Oral Q6H PRN Money, Lowry Ram, FNP      . alum & mag hydroxide-simeth (MAALOX/MYLANTA) 200-200-20 MG/5ML suspension 30 mL  30 mL Oral Q4H PRN Money, Lowry Ram, FNP      . ARIPiprazole (ABILIFY) tablet 15 mg  15 mg Oral Daily Lindell Spar I, NP   15 mg at 12/03/20 0933  . clotrimazole (LOTRIMIN) 1 % cream   Topical BID Money, Lowry Ram, FNP   Given at 12/03/20 1757  . FLUoxetine (PROZAC) capsule 20 mg  20 mg Oral Daily Nwoko, Herbert Pun I, NP   20 mg at 12/03/20 0934  . hydrOXYzine (ATARAX/VISTARIL) tablet 10 mg  10 mg Oral TID PRN Money, Lowry Ram, FNP   10 mg at 12/02/20 0823  . levETIRAcetam (KEPPRA) tablet 500 mg  500 mg Oral BID Sharma Covert, MD   500 mg at 12/03/20 1757  . magnesium citrate solution 1 Bottle  1 Bottle Oral Once Shanda Cadotte E, MD      . magnesium hydroxide (MILK OF MAGNESIA) suspension 30 mL  30 mL Oral Daily PRN Money, Lowry Ram, FNP    30 mL at 12/03/20 0938  . nicotine (NICODERM CQ - dosed in mg/24 hours) patch 21 mg  21 mg Transdermal Daily Arthor Captain, MD   21 mg at 12/03/20 0934  . traZODone (DESYREL) tablet 100 mg  100 mg Oral QHS Lindell Spar I, NP   100 mg at 12/02/20 2135   Lab Results: No results found for this or any previous visit (from the past 48 hour(s)).  Blood Alcohol level:  Lab Results  Component Value Date   ETH <10 11/29/2020   ETH <10 41/28/7867   Metabolic Disorder Labs: Lab Results  Component Value Date   HGBA1C 5.3 11/11/2018   MPG 105.41 11/11/2018   Lab Results  Component Value Date   PROLACTIN 36.9 (H) 11/11/2018   No results found for: CHOL, TRIG, HDL, CHOLHDL, VLDL, LDLCALC  Physical Findings: AIMS: Facial and Oral Movements Muscles of Facial Expression: None, normal Lips and Perioral Area: None, normal Jaw: None, normal Tongue: None, normal,Extremity Movements Upper (arms, wrists, hands, fingers): None, normal Lower (legs, knees, ankles, toes): None, normal, Trunk Movements Neck, shoulders, hips: None, normal, Overall Severity Severity of abnormal movements (highest score from questions above): None, normal Incapacitation due to abnormal movements: None, normal Patient's awareness of abnormal movements (rate only patient's report): No Awareness, Dental Status Current problems with teeth and/or dentures?: No Does patient usually wear dentures?: No    Musculoskeletal: Strength & Muscle Tone: within normal limits Gait & Station: normal, steady Patient leans: N/A  Psychiatric Specialty Exam:  Presentation: Psychiatric Specialty Exam: Physical Exam Vitals and nursing note reviewed.  HENT:     Head: Normocephalic.  Pulmonary:     Effort: Pulmonary effort is normal.  Genitourinary:    Comments: Deferred Neurological:     General: No focal deficit present.     Mental Status: He is alert.     Review of Systems  Respiratory: Negative for shortness of breath.    Cardiovascular: Negative for chest pain.  Gastrointestinal: Positive for constipation and vomiting. Negative for abdominal pain and nausea.  Musculoskeletal: Negative.   Skin: Negative.   Neurological: Positive for  dizziness.  Hematological:       Allergies: See lists please.  Psychiatric/Behavioral: Positive for depression and substance abuse. Negative for hallucinations, memory loss and suicidal ideas. The patient has insomnia.     Blood pressure 93/61, pulse 90, temperature 97.7 F (36.5 C), temperature source Oral, resp. rate 18, height 5\' 6"  (1.676 m), weight 79.4 kg, SpO2 96 %.Body mass index is 28.25 kg/m.  General Appearance: casually dressed in scrubs with fair hygiene  Eye Contact: Fair  Speech:  Clear and Coherent and Normal Rate  Volume:  Normal  Mood:  described as good - appears euthymic  Affect:  moderate, stable, full  Thought Process: Linear and goal directed  Orientation:  Full (Time, Place, and Person)  Thought Content:  Denies AVH, paranoia, ideas of reference, or first rank symptoms; no acute psychosis or paranoia on exam  Suicidal Thoughts: Denied  Homicidal Thoughts:  Denied  Memory:  Immediate;   Fair Recent;   Fair Remote;   Fair  Judgement:  Fair  Insight:  Fair  Psychomotor Activity:  Normal  Concentration:  Concentration: Fair and Attention Span: Fair  Recall:  [employer] of Knowledge:  Fair  Language:  Good  Akathisia:  Negative  Assets:  Communication Skills Desire for Improvement Physical Health Resilience  ADL's:  Intact  Cognition:  WNL  Sleep:  Number of Hours: 6.75    Sleep:  Number of Hours: 6.75   Physical Exam:  Body mass index is 28.25 kg/m.  Treatment Plan Summary: Daily contact with patient to assess and evaluate symptoms and progress in treatment and Medication management.  Continue inpatient hospitalization.  Mood control. Continue Abilify 15 mg po daily .  Depression. Continue Fluoxetine 20 mg po  daily.  Anxiety. Continue Vistaril 25 mg po tid prn.  PTSD/nightmares. Hold Prazosin due to recent dizziness given concomitant use of Trazodone for sleep  Insomnia.  Continue Trazodone 100 mg po Q hs routine.  Seizure disorder. Continue Keppra 500 mg po bid.  Continue all other prn medications for anxiety, pain, insomnia etc. Encourage group participation. Discharge disposition plan ongoing.  Harlow Asa, MD,FAPA 12/03/2020, 6:34 PM

## 2020-12-04 DIAGNOSIS — F251 Schizoaffective disorder, depressive type: Secondary | ICD-10-CM | POA: Diagnosis not present

## 2020-12-04 NOTE — [facility] Group Notes (Incomplete)
.  Psychoeducational Group Note  Date: 12/03/2020 Time: 0900-1000    Goal Setting   Purpose of Group: This group helps to provide patients with the steps of setting a goal that is specific, measurable, attainable, realistic and time specific. A discussion on how we keep ourselves stuck with negative self talk.    Participation Level:  Active  Participation Quality:  Appropriate  Affect:  Appropriate  Cognitive:  Appropriate  Insight:  Improving  Engagement in Group:  Engaged  Additional Comments:  .Marland KitchenMarland KitchenPt came to thev group that identifies who she is . Rates her calling at an 8/10. Affect is prolabially more li ts;lomh snpiy yjr ;sdy ys;o[oyou would want oi  Paulino Rily

## 2020-12-04 NOTE — Progress Notes (Signed)
   12/04/20 2121  COVID-19 Daily Checkoff  Have you had a fever (temp > 37.80C/100F)  in the past 24 hours?  No  If you have had runny nose, nasal congestion, sneezing in the past 24 hours, has it worsened? No  COVID-19 EXPOSURE  Have you traveled outside the state in the past 14 days? No  Have you been in contact with someone with a confirmed diagnosis of COVID-19 or PUI in the past 14 days without wearing appropriate PPE? No  Have you been living in the same home as a person with confirmed diagnosis of COVID-19 or a PUI (household contact)? No  Have you been diagnosed with COVID-19? No

## 2020-12-04 NOTE — Progress Notes (Signed)
   12/03/20 2105  COVID-19 Daily Checkoff  Have you had a fever (temp > 37.80C/100F)  in the past 24 hours?  No  If you have had runny nose, nasal congestion, sneezing in the past 24 hours, has it worsened? No  COVID-19 EXPOSURE  Have you traveled outside the state in the past 14 days? No  Have you been in contact with someone with a confirmed diagnosis of COVID-19 or PUI in the past 14 days without wearing appropriate PPE? No  Have you been living in the same home as a person with confirmed diagnosis of COVID-19 or a PUI (household contact)? No  Have you been diagnosed with COVID-19? No

## 2020-12-04 NOTE — [facility] Suicide Risk Assessment (Signed)
[place] INPATIENT:  Family/Significant Other Suicide Prevention Education  Suicide Prevention Education:  Education Completed;  Sister, Windy Carina [phone]),  (name of family member/significant other) has been identified by the patient as the family member/significant other with whom the patient will be residing, and identified as the person(s) who will aid the patient in the event of a mental health crisis (suicidal ideations/suicide attempt).  With written consent from the patient, the family member/significant other has been provided the following suicide prevention education, prior to the and/or following the discharge of the patient.  The suicide prevention education provided includes the following:  Suicide risk factors  Suicide prevention and interventions  National Suicide Hotline telephone number  [facility] assessment telephone number  [facility] Emergency Assistance [place] and/or Residential Mobile Crisis Unit telephone number  Request made of family/significant other to:  Remove weapons (e.g., guns, rifles, knives), all items previously/currently identified as safety concern.    Remove drugs/medications (over-the-counter, prescriptions, illicit drugs), all items previously/currently identified as a safety concern.  The family member/significant other verbalizes understanding of the suicide prevention education information provided.  The family member/significant other agrees to remove the items of safety concern listed above.  Sister already familiar with suicide prevention and what to do.  Her only concern was that patient will be discharged with a 30-day prescription for his medications.  Berlin Hun Grossman-Orr 12/04/2020, 4:40 PM

## 2020-12-04 NOTE — Progress Notes (Signed)
[facility] MD Progress Note  12/04/2020 11:56 AM Amanda [name]  MRN:  [id number] Subjective:   He reports that the Mag Citrate was succesful in resolving the constipation, he reports having to get up a few times during the night to use the bathroom but that has now resolved. He reports that stopping the Prazosin has resolved the dizziness and that he did not have any nightmares. He reports that he is tolerating his medications well and that his mood is improved. He reports sleep was good and his appetite was good. He reports no SI, HI, or AVH. He reports no other concerns at present. Encouraged continued attendance of group therapy and continuing to interact with others on the unit. Principal Problem: Schizoaffective disorder, depressive type ([place]) Diagnosis: Principal Problem:   Schizoaffective disorder, depressive type ([place]) Active Problems:   Schizoaffective disorder ([place])  Total Time spent with patient: 15 minutes   I personally spent 25 minutes on the unit in direct patient care. The direct patient care time included face-to-face time with the patient, reviewing the patient's chart, communicating with other professionals, and coordinating care. Greater than 50% of this time was spent in counseling or coordinating care with the patient regarding goals of hospitalization, psycho-education, and discharge planning needs.   Past Psychiatric History: Borderline Personality Disorder, PTSD, OCD, Bipolar 2 Disorder, Schizoaffective Disorder, Chronic Cutting Behaviors, multiple hospitalizations.  Past Medical History:  Past Medical History:  Diagnosis Date  . Anxiety   . Asthma   . Asthma due to environmental allergies   . Asthma due to seasonal allergies   . Bipolar 1 disorder ([place])   . Borderline personality disorder ([place])   . Brain bleed ([place])   . Chronic post-traumatic stress disorder (PTSD)    complex chronic with psychotic features and self harm behaviors  . Complication of anesthesia   .  Constipation   . Dander (animal) allergy   . Hearing loss    right ear  . Heart murmur    denies seeing a cardiologist  . Hip dysplasia   . Hypertension    Gestional   . Major depression, chronic   . Mood disorder ([place])   . OCD (obsessive compulsive disorder)   . Pneumonia   . PONV (postoperative nausea and vomiting)   . PTSD (post-traumatic stress disorder)   . RA (rheumatoid arthritis) ([place])   . Rheumatoid arthritis ([place])   . Schizophrenia ([place])    "I think that is wrong"  . Seizure disorder ([place])   . Suicidal ideations   . Suicide attempt [facility])     Past Surgical History:  Procedure Laterality Date  . Brain Shunt  1981   "a few hours old"  . EYE SURGERY Bilateral 1985  . INTRAUTERINE DEVICE (IUD) INSERTION N/A 09/16/2018   Procedure: INTRAUTERINE DEVICE (IUD) INSERTION;  Surgeon: Lavonia Drafts, MD;  Location: [facility];  Service: Gynecology;  Laterality: N/A;  . PERINEUM REPAIR N/A 09/16/2018   Procedure: EPISIOTOMY REVISION;  Surgeon: Lavonia Drafts, MD;  Location: [facility];  Service: Gynecology;  Laterality: N/A;  . [place]  . Tooth Removal     multiple   Family History:  Family History  Problem Relation Age of Onset  . Heart attack Mother   . Stroke Mother   . Liver cancer Mother   . Diabetes Mother   . Lung cancer Mother   . Alcoholism Father   . Sleep apnea Brother   . Depression Brother   . ADD /  ADHD Brother   . Diabetes Maternal Aunt   . Diabetes Paternal Uncle   . Colon cancer Paternal Uncle   . Diabetes Maternal Grandmother   . Dementia Maternal Grandmother    Family Psychiatric  History: Maternal Grandmother: Dementia Father: EtOH Abuse Brother: Depression Brother: ADD Social History:  Social History   Substance and Sexual Activity  Alcohol Use Not Currently     Social History   Substance and Sexual Activity  Drug Use Not Currently  . 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 Marital status: Significant Other    Spouse name: Not on file  . Number of children: 1  . Years of education: 3  . Highest education level: 12th grade  Occupational History  . Not on file  Tobacco Use  . Smoking status: Current Every Day Smoker    Packs/day: 0.40    Years: 26.00    Pack years: 10.40    Types: Cigarettes  . Smokeless tobacco: Never Used  Vaping Use  . Vaping Use: Former  Substance and Sexual Activity  . Alcohol use: Not Currently  . Drug use: Not Currently    Types: Marijuana    Comment: sober from from drugs   . Sexual activity: Not Currently    Birth control/protection: None  Other Topics Concern  . Not on file  Social History Narrative   ** Merged History Encounter **       Lives with her fiance, friend, and fiance's brother   Right handed   Social Determinants of Health   Financial Resource Strain: Low Risk   . Difficulty of Paying Living Expenses: Not hard at all  Food Insecurity: No Food Insecurity  . Worried About Charity fundraiser in the Last Year: Never true  . Ran Out of Food in the Last Year: Never true  Transportation Needs: Unmet Transportation Needs  . Lack of Transportation (Medical): Yes  . Lack of Transportation (Non-Medical): Yes  Physical Activity: Insufficiently Active  . Days of Exercise per Week: 3 days  . Minutes of Exercise per Session: 40 min  Stress: No Stress Concern Present  . Feeling of Stress : Not at all  Social Connections: Moderately Integrated  . Frequency of Communication with Friends and Family: Three times a week  . Frequency of Social Gatherings with Friends and Family: More than three times a week  . Attends Religious Services: Never  . Active Member of Clubs or Organizations: Yes  . Attends Archivist Meetings: More than 4 times per year  . Marital Status: Married   Additional Social History:                         Sleep: Good  Appetite:   Good  Current Medications: Current Facility-Administered Medications  Medication Dose Route Frequency Provider Last Rate Last Admin  . acetaminophen (TYLENOL) tablet 650 mg  650 mg Oral Q6H PRN Money, Lowry Ram, FNP      . alum & mag hydroxide-simeth (MAALOX/MYLANTA) 200-200-20 MG/5ML suspension 30 mL  30 mL Oral Q4H PRN Money, Lowry Ram, FNP      . ARIPiprazole (ABILIFY) tablet 15 mg  15 mg Oral Daily Lindell Spar I, NP   15 mg at 12/04/20 0732  . clotrimazole (LOTRIMIN) 1 % cream   Topical BID Money, Lowry Ram, FNP   1 application at 56/38/75 0731  .  FLUoxetine (PROZAC) capsule 20 mg  20 mg Oral Daily Nwoko, Agnes I, NP   20 mg at 12/04/20 0732  . hydrOXYzine (ATARAX/VISTARIL) tablet 10 mg  10 mg Oral TID PRN Money, Lowry Ram, FNP   10 mg at 12/03/20 2101  . levETIRAcetam (KEPPRA) tablet 500 mg  500 mg Oral BID Sharma Covert, MD   500 mg at 12/04/20 0733  . magnesium hydroxide (MILK OF MAGNESIA) suspension 30 mL  30 mL Oral Daily PRN Money, Lowry Ram, FNP   30 mL at 12/03/20 0938  . nicotine (NICODERM CQ - dosed in mg/24 hours) patch 21 mg  21 mg Transdermal Daily Arthor Captain, MD   21 mg at 12/04/20 0733  . traZODone (DESYREL) tablet 100 mg  100 mg Oral QHS Lindell Spar I, NP   100 mg at 12/03/20 2101    Lab Results:  Results for orders placed or performed during the hospital encounter of 11/30/20 (from the past 48 hour(s))  Lipid panel     Status: Abnormal   Collection Time: 12/03/20  6:07 PM  Result Value Ref Range   Cholesterol 180 0 - 200 mg/dL   Triglycerides 147 <150 mg/dL   HDL 43 >40 mg/dL   Total CHOL/HDL Ratio 4.2 RATIO   VLDL 29 0 - 40 mg/dL   LDL Cholesterol 108 (H) 0 - 99 mg/dL    Comment:        Total Cholesterol/HDL:CHD Risk Coronary Heart Disease Risk Table                     Men   Women  1/2 Average Risk   3.4   3.3  Average Risk       5.0   4.4  2 X Average Risk   9.6   7.1  3 X Average Risk  23.4   11.0        Use the calculated Patient Ratio above and  the CHD Risk Table to determine the patient's CHD Risk.        ATP III CLASSIFICATION (LDL):  <100     mg/dL   Optimal  100-129  mg/dL   Near or Above                    Optimal  130-159  mg/dL   Borderline  160-189  mg/dL   High  >190     mg/dL   Very High Performed at [facility] [address], [place], [place] [address]     Blood Alcohol level:  Lab Results  Component Value Date   [facility] <10 11/29/2020   ETH <10 77/82/4235    Metabolic Disorder Labs: Lab Results  Component Value Date   HGBA1C 5.3 11/11/2018   MPG 105.41 11/11/2018   Lab Results  Component Value Date   PROLACTIN 36.9 (H) 11/11/2018   Lab Results  Component Value Date   CHOL 180 12/03/2020   TRIG 147 12/03/2020   HDL 43 12/03/2020   CHOLHDL 4.2 12/03/2020   VLDL 29 12/03/2020   LDLCALC 108 (H) 12/03/2020    Physical Findings: AIMS: Facial and Oral Movements Muscles of Facial Expression: None, normal Lips and Perioral Area: None, normal Jaw: None, normal Tongue: None, normal,Extremity Movements Upper (arms, wrists, hands, fingers): None, normal Lower (legs, knees, ankles, toes): None, normal, Trunk Movements Neck, shoulders, hips: None, normal, Overall Severity Severity of abnormal movements (highest score from questions above): None, normal Incapacitation  due to abnormal movements: None, normal Patient's awareness of abnormal movements (rate only patient's report): No Awareness, Dental Status Current problems with teeth and/or dentures?: No Does patient usually wear dentures?: No  CIWA:    COWS:     Musculoskeletal: Strength & Muscle Tone: within normal limits Gait & Station: normal Patient leans: N/A  Psychiatric Specialty Exam:  Presentation  General Appearance: Appropriate for Environment  Eye Contact:Fair  Speech:Clear and Coherent  Speech Volume:Normal  Handedness:Right   Mood and Affect  Mood:-- (good)  Affect:Appropriate   Thought Process   Thought Processes:Coherent  Descriptions of Associations:Intact  Orientation:Full (Time, Place and Person)  Thought Content:Logical  History of Schizophrenia/Schizoaffective disorder:No data recorded Duration of Psychotic Symptoms:No data recorded Hallucinations:Hallucinations: None  Ideas of Reference:None  Suicidal Thoughts:Suicidal Thoughts: No  Homicidal Thoughts:Homicidal Thoughts: No   Sensorium  Memory:Immediate Fair; Recent Fair  Judgment:Fair  Insight:Fair   Executive Functions  Concentration:Fair  Attention Span:Fair  Kay  Language:Good   Psychomotor Activity  Psychomotor Activity:Psychomotor Activity: Normal   Assets  Assets:Communication Skills; Desire for Improvement; Resilience   Sleep  Sleep:No data recorded   Physical Exam: Physical Exam Vitals and nursing note reviewed.    ROS Blood pressure 100/81, pulse 91, temperature 97.8 F (36.6 C), temperature source Oral, resp. rate 16, height 5\' 6"  (1.676 m), weight 79.4 kg, SpO2 97 %. Body mass index is 28.25 kg/m.   Treatment Plan Summary: Daily contact with patient to assess and evaluate symptoms and progress in treatment  Amanda [name] is a 40 yr old transgender Female who presents with worsening depression, auditory and visual hallucinations and suicidal ideation with symptoms worsening in the context of break-up with girlfriend, not having custody of his son and trouble finding a place to live as well as financial stressors. PPHx is significant for Borderline Personality Disorder, PTSD, OCD, Bipolar 2 Disorder, Schizoaffective Disorder, Chronic Cutting Behaviors, multiple hospitalizations.   He is doing well and tolerating all medications well. Stopping the Prazosin has resolved the dizziness. Anticipate discharge in the next day or two. Encouraged continuing to attend group therapy and interact with others on the unit.   Mood: -Continue Abilify 15 mg  daily   Depression: -Continue Fluoxetine 20 mg daily   Seizure: -Continue Keppra 500 mg BID   PTSD/Nightmares: -Tolerated stopping Prazosin -Continue to monitor   Constipation: (resolved)  -Continue PRN's: Tylenol, Maalox, Atarax, Milk of Magnesia, Trazodone   Estimated Length of Stay: 1-2 days   Briant Cedar, MD 12/04/2020, 11:56 AM

## 2020-12-04 NOTE — Progress Notes (Signed)
   12/03/20 2105  Psych Admission Type (Psych Patients Only)  Admission Status Voluntary  Psychosocial Assessment  Patient Complaints Worrying  Eye Contact Fair  Facial Expression Flat  Affect Appropriate to circumstance  Speech Logical/coherent  Interaction Assertive  Motor Activity Slow  Appearance/Hygiene Disheveled  Behavior Characteristics Cooperative  Mood Preoccupied;Pleasant  Thought Process  Coherency WDL  Content Blaming others;Preoccupation  Delusions None reported or observed  Perception Hallucinations  Hallucination Command;Auditory;Visual  Judgment Poor  Confusion Mild  Danger to Self  Current suicidal ideation? Denies  Self-Injurious Behavior No self-injurious ideation or behavior indicators observed or expressed   Agreement Not to Harm Self Yes  Description of Agreement Pt verbally contracts for safety  Danger to Others  Danger to Others None reported or observed

## 2020-12-04 NOTE — Progress Notes (Signed)
D:  Patient's self inventory sheet, patient sleeps good, sleep medication helpful.  Good appetite, normal energy level, good concentration.  Denied depression, hopeless, anxiety.  Denied withdrawals.  Denied SI.   Denied physical problems.  Denied physical pain.  Goal is write poem or story.  No discharge plans. A:  Medications administered per MD orders.  Emotional support and encouragement given patient. R:  Denied SI and HI, contracts for safety.  Denied A/V hallucinations.   Safety maintained with 15 minute checks.

## 2020-12-04 NOTE — Plan of Care (Signed)
Nurse discussed coping skills with patient.  

## 2020-12-04 NOTE — Progress Notes (Signed)
   12/04/20 2100  Psych Admission Type (Psych Patients Only)  Admission Status Voluntary  Psychosocial Assessment  Patient Complaints None  Eye Contact Fair  Facial Expression Flat  Affect Appropriate to circumstance  Speech Logical/coherent  Interaction Assertive  Motor Activity Slow  Appearance/Hygiene In scrubs  Behavior Characteristics Cooperative;Appropriate to situation  Mood Pleasant  Thought Process  Coherency WDL  Content WDL  Delusions None reported or observed  Perception WDL  Hallucination None reported or observed  Judgment Poor  Confusion None  Danger to Self  Current suicidal ideation? Denies  Self-Injurious Behavior No self-injurious ideation or behavior indicators observed or expressed   Agreement Not to Harm Self Yes  Description of Agreement Pt verbally contracts for safety  Danger to Others  Danger to Others None reported or observed

## 2020-12-05 ENCOUNTER — Other Ambulatory Visit: Payer: Self-pay

## 2020-12-05 DIAGNOSIS — F251 Schizoaffective disorder, depressive type: Secondary | ICD-10-CM | POA: Diagnosis not present

## 2020-12-05 LAB — HEMOGLOBIN A1C
Hgb A1c MFr Bld: 5.5 % (ref 4.8–5.6)
Mean Plasma Glucose: 111 mg/dL

## 2020-12-05 MED ORDER — HYDROXYZINE HCL 10 MG PO TABS
10.0000 mg | ORAL_TABLET | Freq: Three times a day (TID) | ORAL | 0 refills | Status: DC | PRN
  Filled 2020-12-05: qty 30, 10d supply, fill #0

## 2020-12-05 MED ORDER — FLUOXETINE HCL 20 MG PO CAPS
20.0000 mg | ORAL_CAPSULE | Freq: Every day | ORAL | 0 refills | Status: DC
  Filled 2020-12-05: qty 30, 30d supply, fill #0

## 2020-12-05 MED ORDER — TRAZODONE HCL 100 MG PO TABS
100.0000 mg | ORAL_TABLET | Freq: Every day | ORAL | 0 refills | Status: DC
  Filled 2020-12-05: qty 30, 30d supply, fill #0

## 2020-12-05 MED ORDER — CLOTRIMAZOLE 1 % EX CREA
TOPICAL_CREAM | Freq: Two times a day (BID) | CUTANEOUS | 0 refills | Status: DC
  Filled 2020-12-05: qty 30, fill #0

## 2020-12-05 MED ORDER — LEVETIRACETAM 500 MG PO TABS
500.0000 mg | ORAL_TABLET | Freq: Two times a day (BID) | ORAL | 0 refills | Status: DC
  Filled 2020-12-05: qty 60, 30d supply, fill #0

## 2020-12-05 MED ORDER — ARIPIPRAZOLE 15 MG PO TABS
15.0000 mg | ORAL_TABLET | Freq: Every day | ORAL | 0 refills | Status: DC
  Filled 2020-12-05: qty 30, 30d supply, fill #0

## 2020-12-05 NOTE — Progress Notes (Signed)
Discharge Note:  Patient denies SI/HI AVH at this time. Discharge instructions, AVS, prescriptions and transition record gone over with patient. Patient agrees to comply with medication management, follow-up visit, and outpatient therapy. Patient belongings returned to patient. Patient questions and concerns addressed and answered.  Patient ambulatory off unit.  Patient discharged to home.

## 2020-12-05 NOTE — Progress Notes (Signed)
Recreation Therapy Notes  Date:  4.18.22 Time: 0930 Location: [address] Hall Dayroom  Group Topic: Stress Management  Goal Area(s) Addresses:  Patient will identify positive stress management techniques. Patient will identify benefits of using stress management post d/c.  Intervention:  Stress Management  Activity: Meditation.  LRT played a meditation that was a body scan which encouraged patients to identify any sensations, tightness, hot or cool feelings they may have been feeling throughout their bodies.  Patients were to acknowledge what they were feeling and to relax those areas for complete relaxation.    Education:  Stress Management, Discharge Planning.   Education Outcome: Acknowledges Education  Clinical Observations/Feedback: Patient did not attend group session.    Latoyia Tecson Linsay, LRT/CTRS    Victorino Sparrow A 12/05/2020 10:56 AM
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
## 2020-12-05 NOTE — Progress Notes (Signed)
  [facility] Adult Case Management Discharge Plan :  Will you be returning to the same living situation after discharge:  No. Will be returning to tent. At discharge, do you have transportation home?: No. Safe Transport to be arranged  Do you have the ability to pay for your medications: Yes,  has insurance  Release of information consent forms completed and in the chart;  Patient's signature needed at discharge.  Patient to Follow up at:  [facility]. Go on 12/20/2020.   Specialty: Behavioral Health Why: You have an appointment on 12/20/20 at 1:00 pm for therapy services.  You also have an appointment for medication management on 01/04/21 at 11:30 am. Contact information: [place] [phone]              Next level of care provider has access to [facility] and Suicide Prevention discussed: Yes,  with sister/friend     Has patient been referred to the Quitline?: N/A patient is not a smoker  Patient has been referred for addiction treatment: [place], LCSW 12/05/2020, 10:03 AM

## 2020-12-05 NOTE — BHH Group Notes (Signed)
   ADULT GRIEF GROUP NOTE:   Spiritual care group on grief and loss facilitated by Kathrynn Humble, B[facility]   Group Goal:   Support / Education around grief and loss   Members engage in facilitated group support and psycho-social education.   Group Description:   Following introductions and group rules, group members engaged in facilitated group dialog and support around topic of loss, with particular support around experiences of loss in their lives. Group Identified types of loss (relationships / self / things) and identified patterns, circumstances, and changes that precipitate losses. Reflected on thoughts / feelings around loss, normalized grief responses, and recognized variety in grief experience. Group noted Worden's four tasks of grief in discussion.   Group drew on Adlerian / Rogerian, narrative, MI,   Patient Progress:  Amanda [name] attended and participated in group.  Was actively engaged in conversation and able to share about their own experience of grief and loss.  [place], [place] Pager, [phone] Office: [phone] 12:22 PM

## 2020-12-05 NOTE — Discharge Summary (Signed)
Physician Discharge Summary Note  Patient:  Amanda [name] is an 40 y.o., adult MRN:  [id number] DOB:  1981-03-13 Patient phone:  [phone] (home)  Patient address:   [address] [place] [address],  Total Time spent with patient: 30 minutes  Date of Admission:  11/30/2020 Date of Discharge: 12/05/2020  Reason for Admission:  (From MD's admission note): This is one of another admission assessment in this [facility] for this 40 year old Transgender-female. Amanda [name] likes to be called '[name]'. He was patient in this [facility] in 2020 from March 23 through March 26th for mood stabilization treatments. At the time, she reported that she was dealing with the removal of her newborn son by the cPS due to poor living condition. Amanda [name] is an established mental health patient who has been admitted numerous times in psychiatric hospital within the surrounding cities. This time around, she is admitted to the [facility] from the [facility] with complain of worsening symptoms of depression, auditory/visual hallucinations & suicidal ideations due to not having custody of her son & having difficulties finding a decent home for her & her son. During this admission assessment, Merla Jenny [name]) reports,  "My therapist walked me down to the Behavioral Urgent Care ED 2 days ago. I was having auditory/visual hallucinations & suicidal ideations. The symptoms started last Friday, but were worsening by Tuesday. I think the symptoms were triggered by stress. I have been trying very hard but unsuccessful to find a decent housing for me & my 73 year old son. Then my girlfriend told me that I should not put my son first. She is also blaming me for the problems in our relationship. I thought we are suppose to put our children first before anything we are doing? She is disrespecting my personal space & the boundary I set between [place]. She is always trying to hang around me. I'm taking my medicines. I have not stopped taking any of them, it just that they  stopped working. I did not hurt myself this time, but I have in the past burnt myself, bite the inside of my mouth & overdosed on medications. I just need help to deal with the problems I have right now".   Objective: '[name]' presents with a flat affect, making a fair eye contact. Verbally responsive. Seem calm without any obvious signs of distress. However, she rates her depression #10 & anxety #10. She does have Hx of seizure disorder.  Evaluation on the unit, day of discharge: Patient was seen and evaluated. Patient denies SI/HI/AVH, paranoia and delusions. Patient is taking his medications and has no issues with them. He is attending group therapy and interacting appropriately with staff and peers. Patient reported good sleep and a good appetite. He has been advised to follow up periodically with a PCP for  metabolic monitoring while taking an antipsychotic medication. Patient verbalizes understanding. Patient is stable for discharge today.   Principal Problem: Schizoaffective disorder, depressive type [facility]) Discharge Diagnoses: Principal Problem:   Schizoaffective disorder, depressive type ([place]) Active Problems:   Schizoaffective disorder ([place])   Past Psychiatric History: See MAR  Past Medical History:  Past Medical History:  Diagnosis Date  . Anxiety   . Asthma   . Asthma due to environmental allergies   . Asthma due to seasonal allergies   . Bipolar 1 disorder ([place])   . Borderline personality disorder ([place])   . Brain bleed ([place])   . Chronic post-traumatic stress disorder (PTSD)    complex chronic with  psychotic features and self harm behaviors  . Complication of anesthesia   . Constipation   . Dander (animal) allergy   . Hearing loss    right ear  . Heart murmur    denies seeing a cardiologist  . Hip dysplasia   . Hypertension    Gestional   . Major depression, chronic   . Mood disorder ([place])   . OCD (obsessive compulsive disorder)   . Pneumonia   . PONV  (postoperative nausea and vomiting)   . PTSD (post-traumatic stress disorder)   . RA (rheumatoid arthritis) ([place])   . Rheumatoid arthritis ([place])   . Schizophrenia ([place])    "I think that is wrong"  . Seizure disorder ([place])   . Suicidal ideations   . Suicide attempt [facility])     Past Surgical History:  Procedure Laterality Date  . Brain Shunt  1981   "a few hours old"  . EYE SURGERY Bilateral 1985  . INTRAUTERINE DEVICE (IUD) INSERTION N/A 09/16/2018   Procedure: INTRAUTERINE DEVICE (IUD) INSERTION;  Surgeon: Lavonia Drafts, MD;  Location: [facility];  Service: Gynecology;  Laterality: N/A;  . PERINEUM REPAIR N/A 09/16/2018   Procedure: EPISIOTOMY REVISION;  Surgeon: Lavonia Drafts, MD;  Location: [facility];  Service: Gynecology;  Laterality: N/A;  . [place]  . Tooth Removal     multiple   Family History:  Family History  Problem Relation Age of Onset  . Heart attack Mother   . Stroke Mother   . Liver cancer Mother   . Diabetes Mother   . Lung cancer Mother   . Alcoholism Father   . Sleep apnea Brother   . Depression Brother   . ADD / ADHD Brother   . Diabetes Maternal Aunt   . Diabetes Paternal Uncle   . Colon cancer Paternal Uncle   . Diabetes Maternal Grandmother   . Dementia Maternal Grandmother    Family Psychiatric  History: See MAR Social History:  Social History   Substance and Sexual Activity  Alcohol Use Not Currently     Social History   Substance and Sexual Activity  Drug Use Not Currently  . 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 Marital status: Significant Other    Spouse name: Not on file  . Number of children: 1  . Years of education: 36  . Highest education level: 12th grade  Occupational History  . Not on file  Tobacco Use  . Smoking status: Current Every Day Smoker    Packs/day: 0.40    Years: 26.00    Pack years: 10.40    Types: Cigarettes  . Smokeless  tobacco: Never Used  Vaping Use  . Vaping Use: Former  Substance and Sexual Activity  . Alcohol use: Not Currently  . Drug use: Not Currently    Types: Marijuana    Comment: sober from from drugs   . Sexual activity: Not Currently    Birth control/protection: None  Other Topics Concern  . Not on file  Social History Narrative   ** Merged History Encounter **       Lives with her fiance, friend, and fiance's brother   Right handed   Social Determinants of Health   Financial Resource Strain: Low Risk   . Difficulty of Paying Living Expenses: Not hard at all  Food Insecurity: No Food Insecurity  . Worried About Charity fundraiser  in the Last Year: Never true  . Ran Out of Food in the Last Year: Never true  Transportation Needs: Unmet Transportation Needs  . Lack of Transportation (Medical): Yes  . Lack of Transportation (Non-Medical): Yes  Physical Activity: Insufficiently Active  . Days of Exercise per Week: 3 days  . Minutes of Exercise per Session: 40 min  Stress: No Stress Concern Present  . Feeling of Stress : Not at all  Social Connections: Moderately Integrated  . Frequency of Communication with Friends and Family: Three times a week  . Frequency of Social Gatherings with Friends and Family: More than three times a week  . Attends Religious Services: Never  . Active Member of Clubs or Organizations: Yes  . Attends Archivist Meetings: More than 4 times per year  . Marital Status: Married    Hospital Course:  After the above admission evaluation, [name]'s  presenting symptoms were noted. He was recommended for mood stabilization treatments. The medication regimen targeting those presenting symptoms were discussed with him/her & initiated with his consent. His medications have been adjusted to meet his needs and help decrease his symptoms. He is tolerating his current medication regimen without complaint of side effects. His UDS and BAL on arrival to the ED were.  He was however medicated, stabilized & discharged on the medications as listed on his discharge medication lists below. Besides the mood stabilization treatments, Jenny [name] was also enrolled & participated in the group counseling sessions being offered & held on this unit. He learned coping skills. He presented no other significant pre-existing medical issues that required treatment. He tolerated his treatment regimen without any adverse effects or reactions reported.   During the course of his hospitalization, the 15-minute checks were adequate to ensure patient's safety. [name] did not display any dangerous, violent or suicidal behavior on the unit.  He interacted with patients & staff appropriately, participated appropriately in the group sessions/therapies. His medications were addressed & adjusted to meet his needs. He was recommended for outpatient follow-up care & medication management upon discharge to assure continuity of care & mood stability.  At the time of discharge patient is not reporting any acute suicidal/homicidal ideations. He feels more confident about his self-care & in managing his mental health. He currently denies any new issues or concerns. Education and supportive counseling provided throughout his hospital stay & upon discharge.   Today upon his discharge evaluation with the attending psychiatrist, Jenny [name] shares he is doing well. He denies any other specific concerns. He is sleeping well. His appetite is good. He denies other physical complaints. He denies AH/VH, delusional thoughts or paranoia. He does not appear to be responding to any internal stimuli. He feels that his medications have been helpful & is in agreement to continue his current treatment regimen as recommended. He was able to engage in safety planning including plan to return to [facility] or contact emergency services if he feels unable to maintain his own safety or the safety of others. Pt had no further questions, comments, or  concerns. He left [facility] with all personal belongings in no apparent distress. Transportation per [employer].   Physical Findings: AIMS: Facial and Oral Movements Muscles of Facial Expression: None, normal Lips and Perioral Area: None, normal Jaw: None, normal Tongue: None, normal,Extremity Movements Upper (arms, wrists, hands, fingers): None, normal Lower (legs, knees, ankles, toes): None, normal, Trunk Movements Neck, shoulders, hips: None, normal, Overall Severity Severity of abnormal movements (highest score from questions above): None,  normal Incapacitation due to abnormal movements: None, normal Patient's awareness of abnormal movements (rate only patient's report): No Awareness, Dental Status Current problems with teeth and/or dentures?: No Does patient usually wear dentures?: No  CIWA:    COWS:     Musculoskeletal: Strength & Muscle Tone: within normal limits Gait & Station: normal Patient leans: N/A  Psychiatric Specialty Exam:  Presentation  General Appearance: Appropriate for Environment; Casual  Eye Contact:Good  Speech:Clear and Coherent; Normal Rate  Speech Volume:Normal  Handedness:Right   Mood and Affect  Mood:Euthymic (good)  Affect:Appropriate; Congruent  Thought Process  Thought Processes:Coherent  Descriptions of Associations:Intact  Orientation:Full (Time, Place and Person)  Thought Content:Logical  History of Schizophrenia/Schizoaffective disorder:No data recorded Duration of Psychotic Symptoms:No data recorded Hallucinations:Hallucinations: None  Ideas of Reference:None  Suicidal Thoughts:Suicidal Thoughts: No  Homicidal Thoughts:Homicidal Thoughts: No  Sensorium  Memory:Recent Fair; Remote Fair; Immediate Good  Judgment:Fair  Insight:Fair  Executive Functions  Concentration:Fair  Attention Span:Good  [place] of Knowledge:Good  Language:Good  Psychomotor Activity  Psychomotor Activity:Psychomotor  Activity: Normal  Assets  Assets:Communication Skills; Desire for Improvement; Resilience; Housing; Catering manager; Social Support; Physical Health  Sleep  Sleep:Number of Hours of Sleep: 5.75    Physical Exam: Physical Exam Vitals and nursing note reviewed.  Constitutional:      Appearance: Normal appearance.  HENT:     Head: Normocephalic.  Pulmonary:     Effort: Pulmonary effort is normal.  Musculoskeletal:        General: Normal range of motion.     Cervical back: Normal range of motion.  Neurological:     Mental Status: He is alert and oriented to person, place, and time.  Psychiatric:        Attention and Perception: Attention and perception normal. He does not perceive auditory or visual hallucinations.        Mood and Affect: Mood normal.        Speech: Speech normal.        Behavior: Behavior normal. Behavior is cooperative.        Thought Content: Thought content normal. Thought content is not paranoid or delusional. Thought content does not include homicidal or suicidal ideation. Thought content does not include homicidal or suicidal plan.        Cognition and Memory: Cognition normal.    Review of Systems  Constitutional: Negative for fever.  HENT: Negative.  Negative for congestion and sore throat.   Respiratory: Negative.  Negative for cough and shortness of breath.   Cardiovascular: Negative.   Gastrointestinal: Negative.   Genitourinary: Negative.   Musculoskeletal: Negative.   Neurological: Negative.    Blood pressure 100/81, pulse 91, temperature 97.8 F (36.6 C), temperature source Oral, resp. rate 16, height 5\' 6"  (1.676 m), weight 79.4 kg, SpO2 97 %. Body mass index is 28.25 kg/m.  Has this patient used any form of tobacco in the last 30 days? (Cigarettes, Smokeless Tobacco, Cigars, and/or Pipes) Yes, Yes, A prescription for an FDA-approved tobacco cessation medication was offered at discharge and the patient refused  Blood Alcohol  level:  Lab Results  Component Value Date   [facility] <10 11/29/2020   ETH <10 16/96/7893    Metabolic Disorder Labs:  Lab Results  Component Value Date   HGBA1C 5.5 12/03/2020   MPG 111 12/03/2020   MPG 105.41 11/11/2018   Lab Results  Component Value Date   PROLACTIN 36.9 (H) 11/11/2018   Lab Results  Component Value Date  CHOL 180 12/03/2020   TRIG 147 12/03/2020   HDL 43 12/03/2020   CHOLHDL 4.2 12/03/2020   VLDL 29 12/03/2020   LDLCALC 108 (H) 12/03/2020    See Psychiatric Specialty Exam and Suicide Risk Assessment completed by Attending Physician prior to discharge.  Discharge destination:  Other:  Shelter  Is patient on multiple antipsychotic therapies at discharge:  Yes,   Do you recommend tapering to monotherapy for antipsychotics?  No   Has Patient had three or more failed trials of antipsychotic monotherapy by history:  No  Recommended Plan for Multiple Antipsychotic Therapies: NA  Discharge Instructions    Diet - low sodium heart healthy   Complete by: As directed    Increase activity slowly   Complete by: As directed      Allergies as of 12/05/2020      Reactions   Aspartame And Phenylalanine Anaphylaxis, Hives, Diarrhea   Benadryl [diphenhydramine] Anaphylaxis, Diarrhea, Other (See Comments)   Blisters   Other Anaphylaxis, Nausea And Vomiting, Rash, Other (See Comments)   Aspartame- Blisters Dust- Worsens asthma Ragweed- Worsens asthma, face gets red, and sneezing Animal Fur/Dander- Worsens asthma and sneezing   Scallops [shellfish Allergy] Anaphylaxis, Diarrhea, Nausea And Vomiting   Yellow Jacket Venom [bee Venom] Anaphylaxis, Diarrhea, Nausea And Vomiting   Seizures and numbness   Pollen Extract Other (See Comments)   Runny nose, eyes, and asthma worsens   Tegretol [carbamazepine] Hives, Diarrhea, Other (See Comments)   Blisters in mouth and increase in seizures   Adhesive [tape] Rash   Latex Hives, Rash   Blisters, also- Condoms and dental  encounters      Medication List    STOP taking these medications   acetaminophen 325 MG tablet Commonly known as: TYLENOL   prazosin 1 MG capsule Commonly known as: MINIPRESS     TAKE these medications     Indication  ARIPiprazole 15 MG tablet Commonly known as: ABILIFY Take 1 tablet (15 mg total) by mouth daily. Start taking on: December 06, 2020 What changed:   medication strength  how much to take  Another medication with the same name was removed. Continue taking this medication, and follow the directions you see here.  Indication: Major Depressive Disorder, Mood control   clotrimazole 1 % cream Commonly known as: LOTRIMIN Apply topically 2 (two) times daily.  Indication: Candida Infection of Skin and Mouth or Vagina   FLUoxetine 20 MG capsule Commonly known as: PROZAC Take 1 capsule (20 mg total) by mouth daily. Start taking on: December 06, 2020  Indication: Depression   hydrOXYzine 10 MG tablet Commonly known as: ATARAX/VISTARIL Take 1 tablet (10 mg total) by mouth 3 (three) times daily as needed for anxiety. What changed: Another medication with the same name was removed. Continue taking this medication, and follow the directions you see here.  Indication: Feeling Anxious   Iron 325 (65 Fe) MG Tabs Take 1 tablet by mouth daily.  Indication: Anemia From Inadequate Iron in the Body   levETIRAcetam 500 MG tablet Commonly known as: Keppra Take 1 tablet (500 mg total) by mouth 2 (two) times daily.  Indication: Seizure   traZODone 100 MG tablet Commonly known as: DESYREL Take 1 tablet (100 mg total) by mouth at bedtime. What changed:   medication strength  how much to take  when to take this  reasons to take this  Another medication with the same name was removed. Continue taking this medication, and follow the directions you see here.  Indication: Trouble Sleeping   WOMENS MULTIVITAMIN PO Take 1 tablet by mouth daily.  Indication: supplement        Follow-up Information    [place]. Go on 12/20/2020.   Specialty: Behavioral Health Why: You have an appointment on 12/20/20 at 1:00 pm for therapy services.  You also have an appointment for medication management on 01/04/21 at 11:30 am. Contact information: [place]              Follow-up recommendations:  Activity:  as tolerated Diet:  Heart healthy  Comments:  Prescriptions given at discharge.  Patient agreeable to plan.  Given opportunity to ask questions.  Appears to feel comfortable with discharge denies any current suicidal or homicidal thoughts.   Patient is instructed prior to discharge to: Take all medications as prescribed by his mental healthcare provider. Report any adverse effects and or reactions from the medicines to his outpatient provider promptly. Patient has been instructed & cautioned: To not engage in alcohol and or illegal drug use while on prescription medicines. In the event of worsening symptoms, patient is instructed to call the crisis hotline, 911 and or go to the nearest ED for appropriate evaluation and treatment of symptoms. To follow-up with his primary care provider for your other medical issues, concerns and or health care needs.   Signed: Ethelene Hal, NP 12/05/2020, 11:26 AM

## 2020-12-12 ENCOUNTER — Other Ambulatory Visit: Payer: Self-pay

## 2020-12-20 ENCOUNTER — Other Ambulatory Visit: Payer: Self-pay

## 2020-12-20 ENCOUNTER — Ambulatory Visit (INDEPENDENT_AMBULATORY_CARE_PROVIDER_SITE_OTHER): Payer: Medicare (Managed Care) | Admitting: Licensed Clinical Social Worker

## 2020-12-20 DIAGNOSIS — F251 Schizoaffective disorder, depressive type: Secondary | ICD-10-CM

## 2020-12-20 NOTE — Progress Notes (Signed)
   THERAPIST PROGRESS NOTE  Session Time: 24   Participation Level: Active  Behavioral Response: CasualAlertAnxious and Depressed  Type of Therapy: Individual Therapy  Treatment Goals addressed: Diagnosis: schizopaffective Depressive type   Interventions: CBT  Summary: Malarie Tappen is a 40 y.o. adult who presents with schizoaffective disorder   Suicidal/Homicidal: NAwithout intent/plan  Therapist Response:    Subjective/Objective:  Pt was alert and oriented x 5. he was dressed casually and engaged well in therapy session. Pt presented with depressed and anxious mood/affect. He was cooperative and maintained good eye contact    Pt comes today for first session since hospital/[facility] discharge. Pt reports she spent 7 days at [facility]. [name] denies HI/SI/AH/VH since [facility] discharge. He states that one medication was change at [facility] which he will discuss with Burt Ek NP in 1 week. [name] reports decrease in stress with significant other now out of the picture and moving out of the state. Pt has related to "[facility]" and has an appointment scheduled with them this week.    Assessment/Plan: Pt endorse symptoms for depression and anxiety as gentleness, fatigue, hopelessness, worthlessness, insomnia, tension, and worry. He states a decrease in AH and VH. He will f/u with LCSW in 4 weeks. Plan will be to read 5 to 10 pages from book 5/7 days, write 5/7 day in computer journal, and complete housing intake with "[facility]"   Plan: Return again in 4  weeks.      [name], LCSW 12/20/2020

## 2021-01-04 ENCOUNTER — Other Ambulatory Visit: Payer: Self-pay

## 2021-01-04 ENCOUNTER — Ambulatory Visit (INDEPENDENT_AMBULATORY_CARE_PROVIDER_SITE_OTHER): Payer: Medicare (Managed Care) | Admitting: Psychiatry

## 2021-01-04 ENCOUNTER — Encounter (HOSPITAL_COMMUNITY): Payer: Self-pay | Admitting: Psychiatry

## 2021-01-04 VITALS — BP 113/82 | HR 82 | Ht 66.0 in | Wt 182.0 lb

## 2021-01-04 DIAGNOSIS — F4310 Post-traumatic stress disorder, unspecified: Secondary | ICD-10-CM | POA: Diagnosis not present

## 2021-01-04 DIAGNOSIS — F251 Schizoaffective disorder, depressive type: Secondary | ICD-10-CM | POA: Diagnosis not present

## 2021-01-04 DIAGNOSIS — F411 Generalized anxiety disorder: Secondary | ICD-10-CM

## 2021-01-04 MED ORDER — ARIPIPRAZOLE 15 MG PO TABS
15.0000 mg | ORAL_TABLET | Freq: Every day | ORAL | 2 refills | Status: DC
  Filled 2021-01-04: qty 30, 30d supply, fill #0

## 2021-01-04 MED ORDER — TRAZODONE HCL 100 MG PO TABS
100.0000 mg | ORAL_TABLET | Freq: Every day | ORAL | 2 refills | Status: DC
  Filled 2021-01-04: qty 30, 30d supply, fill #0

## 2021-01-04 MED ORDER — FLUOXETINE HCL 20 MG PO CAPS
20.0000 mg | ORAL_CAPSULE | Freq: Every day | ORAL | 2 refills | Status: DC
  Filled 2021-01-04: qty 30, 30d supply, fill #0

## 2021-01-04 MED ORDER — HYDROXYZINE HCL 25 MG PO TABS
25.0000 mg | ORAL_TABLET | Freq: Three times a day (TID) | ORAL | 2 refills | Status: DC | PRN
  Filled 2021-01-04: qty 90, 30d supply, fill #0
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
## 2021-01-11 ENCOUNTER — Other Ambulatory Visit: Payer: Self-pay

## 2021-01-25 ENCOUNTER — Ambulatory Visit (INDEPENDENT_AMBULATORY_CARE_PROVIDER_SITE_OTHER): Payer: Medicare (Managed Care) | Admitting: Licensed Clinical Social Worker

## 2021-01-25 ENCOUNTER — Other Ambulatory Visit: Payer: Self-pay

## 2021-01-25 DIAGNOSIS — F251 Schizoaffective disorder, depressive type: Secondary | ICD-10-CM
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
## 2021-02-08 ENCOUNTER — Other Ambulatory Visit (HOSPITAL_COMMUNITY): Payer: Self-pay

## 2021-02-22 ENCOUNTER — Other Ambulatory Visit: Payer: Self-pay

## 2021-02-22 ENCOUNTER — Ambulatory Visit (INDEPENDENT_AMBULATORY_CARE_PROVIDER_SITE_OTHER): Payer: Medicare (Managed Care) | Admitting: Licensed Clinical Social Worker

## 2021-02-22 DIAGNOSIS — F251 Schizoaffective disorder, depressive type: Secondary | ICD-10-CM | POA: Diagnosis not present

## 2021-02-22 DIAGNOSIS — F411 Generalized anxiety disorder: Secondary | ICD-10-CM

## 2021-02-22 DIAGNOSIS — F4310 Post-traumatic stress disorder, unspecified: Secondary | ICD-10-CM

## 2021-02-22 NOTE — Progress Notes (Signed)
   THERAPIST PROGRESS NOTE  Session Time: 55   Virtual Visit via Telephone Note  I connected with Amanda [name] on 02/22/21 at  1:00 PM EDT by telephone and verified that I am speaking with the correct person using two identifiers.  Location: Patient: [facility]  Provider: [facility]   I discussed the limitations, risks, security and privacy concerns of performing an evaluation and management service by telephone and the availability of in person appointments. I also discussed with the patient that there may be a patient responsible charge related to this service. The patient expressed understanding and agreed to proceed.  I discussed the assessment and treatment plan with the patient. The patient was provided an opportunity to ask questions and all were answered. The patient agreed with the plan and demonstrated an understanding of the instructions.   The patient was advised to call back or seek an in-person evaluation if the symptoms worsen or if the condition fails to improve as anticipated.  I provided 45 minutes of non-face-to-face time during this encounter.   [name], LCSW   Participation Level: Active  Behavioral Response:  assessment done over the phone. AlertAnxious  Type of Therapy: Individual Therapy  Treatment Goals addressed: Coping  Interventions: CBT, Supportive, and Reframing  Summary: Amanda [name] is a 40 y.o. adult who presents with anxious mood/affect. He had tangential thought process. She was cooperative and maintained good eye contact.  Primary stressor for pt is illness and grief/loss. He came in for in person session, however brought her dog. LCSW explained that certification for animal needed to be on file per practice administrator as this is a liability for the practice if the dog is not properly trained. Pt was cooperative and completed assessment via phone.  Pt states today when she was going to see her son. Pt started to  have a seizure. She did not seek out any medical attention. LCSW inquired if pt has a neurologist and pt stated that they dropped her for no reason. LCSW then inquired if he has a PCP and pt stated yes but does not see him. her until Jan. LCSW reported to pt it may be worth asking to get appointment moved up with seizure. Amanda [name] was agreeable.  Other stressor for pt is grief loss. Pt used examples of twin brother, grandmother, and her Ardine Bjork. Pt was scattered throughout session, and she struggled with her tangential thought process and LCSW attempted to conduct Narrative therapy.    Suicidal/Homicidal: NAwithout intent/plan  Therapist Response:    Intervention/Plan: LCSW used supportive therapy in today's session. Providing encouragement and positive reinforcement in today session. LCSW offered guidance and suggested as plan for next session writing a letter to her deceased uncle to help provide closure.    Plan: Return again in 4 weeks.      [name], LCSW 02/22/2021

## 2021-03-10 ENCOUNTER — Other Ambulatory Visit: Payer: Self-pay

## 2021-03-10 ENCOUNTER — Emergency Department (HOSPITAL_COMMUNITY)
Admission: EM | Admit: 2021-03-10 | Discharge: 2021-03-10 | Disposition: A | Discharge status: Home / Self Care | Payer: Medicare (Managed Care) | Attending: Emergency Medicine | Admitting: Emergency Medicine

## 2021-03-10 ENCOUNTER — Encounter (HOSPITAL_COMMUNITY): Payer: Self-pay

## 2021-03-10 DIAGNOSIS — L509 Urticaria, unspecified: Secondary | ICD-10-CM | POA: Insufficient documentation

## 2021-03-10 DIAGNOSIS — J45909 Unspecified asthma, uncomplicated: Secondary | ICD-10-CM | POA: Insufficient documentation

## 2021-03-10 DIAGNOSIS — F17210 Nicotine dependence, cigarettes, uncomplicated: Secondary | ICD-10-CM | POA: Diagnosis not present

## 2021-03-10 DIAGNOSIS — T7840XA Allergy, unspecified, initial encounter: Secondary | ICD-10-CM

## 2021-03-10 DIAGNOSIS — R112 Nausea with vomiting, unspecified: Secondary | ICD-10-CM | POA: Insufficient documentation

## 2021-03-10 DIAGNOSIS — T63441A Toxic effect of venom of bees, accidental (unintentional), initial encounter: Secondary | ICD-10-CM | POA: Diagnosis present

## 2021-03-10 DIAGNOSIS — Z79899 Other long term (current) drug therapy: Secondary | ICD-10-CM | POA: Diagnosis not present

## 2021-03-10 DIAGNOSIS — Z91040 Latex allergy status: Secondary | ICD-10-CM | POA: Insufficient documentation

## 2021-03-10 MED ORDER — HYDROXYZINE HCL 25 MG PO TABS
25.0000 mg | ORAL_TABLET | Freq: Once | ORAL | Status: AC
  Administered 2021-03-10: 25 mg via ORAL
  Filled 2021-03-10: qty 1

## 2021-03-10 MED ORDER — FAMOTIDINE 20 MG PO TABS
40.0000 mg | ORAL_TABLET | Freq: Once | ORAL | Status: AC
  Administered 2021-03-10: 40 mg via ORAL
  Filled 2021-03-10: qty 2

## 2021-03-10 MED ORDER — EPINEPHRINE 0.3 MG/0.3ML IJ SOAJ: 0.3000 mg | INTRAMUSCULAR | 0 refills | Status: DC | PRN

## 2021-03-10 MED ORDER — DEXAMETHASONE 4 MG PO TABS
6.0000 mg | ORAL_TABLET | Freq: Once | ORAL | Status: AC
  Administered 2021-03-10: 6 mg via ORAL
  Filled 2021-03-10: qty 1

## 2021-03-10 NOTE — ED Provider Notes (Signed)
[facility] DEPT Provider Note   CSN: [id number] Arrival date & time: 03/10/21  [id number]     History Chief Complaint  Patient presents with   Insect Bite    Amanda [name] is a 40 y.o. adult.  HPI  40 year old adult presenting to the emergency department with history of bipolar 1 disorder, borderline personality disorder, see PTSD, homelessness, presenting to the emergency department with a welt on her back after being stung by a wasp.  She states that she has had prior allergic reactions resulting in diffuse hives and urticaria after bee stings.  She states a friend she a wasp on her back and she subsequently felt a sharp sting.  She has 1 small welt on her back.  She does endorse nausea and vomiting x2 (NBNB) but no other symptoms.  No throat or mouth swelling, no wheezing.  No diffuse hives or urticaria.  Of note, she states that she has had anaphylaxis following diphenhydramine administration.  Past Medical History:  Diagnosis Date   Anxiety    Asthma    Asthma due to environmental allergies    Asthma due to seasonal allergies    Bipolar 1 disorder ([place])    Borderline personality disorder ([place])    Brain bleed ([place])    Chronic post-traumatic stress disorder (PTSD)    complex chronic with psychotic features and self harm behaviors   Complication of anesthesia    Constipation    Dander (animal) allergy    Hearing loss    right ear   Heart murmur    denies seeing a cardiologist   Hip dysplasia    Hypertension    Gestional    Major depression, chronic    Mood disorder (HCC)    OCD (obsessive compulsive disorder)    Pneumonia    PONV (postoperative nausea and vomiting)    PTSD (post-traumatic stress disorder)    RA (rheumatoid arthritis) ([place])    Rheumatoid arthritis ([place])    Schizophrenia ([place])    "I think that is wrong"   Seizure disorder ([place])    Suicidal ideations    Suicide attempt [facility])     Patient Active Problem List   Diagnosis  Date Noted   Schizoaffective disorder ([place]) 11/30/2020   MDD (major depressive disorder), recurrent, severe, 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/29/2020   GAD (generalized anxiety disorder) 07/01/2020   Bipolar 1 disorder, mixed, moderate ([place]) 07/01/2020   MDD (major depressive disorder), recurrent severe, without psychosis ([place]) 11/01/2019   Major depressive disorder, recurrent episode, severe ([place]) 11/13/2018   Bipolar 1 disorder, depressed ([place]) 11/10/2018   Seizures ([place]) 09/23/2018   Disruption of episiotomy wound in the puerperium 09/04/2018   Postpartum care following vaginal delivery 07/28/2018   Encounter for IUD insertion 07/24/2018   Gestational hypertension 06/18/2018   Pap smear of cervix shows high risk HPV present 01/14/2018   Supervision of high risk pregnancy, antepartum 01/08/2018   Seizure disorder during pregnancy ([place]) 01/08/2018   Advanced maternal age, primigravida 01/08/2018   Bipolar 2 disorder ([place]) 01/08/2018   Borderline personality disorder ([place]) 01/08/2018   Major depression, chronic 01/08/2018   PTSD (post-traumatic stress disorder) 01/08/2018   OCD (obsessive compulsive disorder) 01/08/2018   Asthma    Seizure disorder ([place]) 11/06/2017   Schizoaffective disorder, depressive type ([place]) 08/16/2017    Past Surgical History:  Procedure Laterality Date   Brain Shunt  1981   "a few hours old"   EYE SURGERY Bilateral [place] (  IUD) INSERTION N/A 09/16/2018   Procedure: INTRAUTERINE DEVICE (IUD) INSERTION;  Surgeon: Lavonia Drafts, MD;  Location: [facility];  Service: Gynecology;  Laterality: N/A;   PERINEUM REPAIR N/A 09/16/2018   Procedure: EPISIOTOMY REVISION;  Surgeon: Lavonia Drafts, MD;  Location: [facility];  Service: Gynecology;  Laterality: N/A;   SHUNT REMOVAL  1983   Tooth Removal     multiple     OB History     Gravida  2   Para  1   Term  1   Preterm  0   AB  1   Living  1      SAB  1   IAB  0   Ectopic   0   Multiple  0   Live Births  1           Family History  Problem Relation Age of Onset   Heart attack Mother    Stroke Mother    Liver cancer Mother    Diabetes Mother    Lung cancer Mother    Alcoholism Father    Sleep apnea Brother    Depression Brother    ADD / ADHD Brother    Diabetes Maternal Aunt    Diabetes Paternal Uncle    Colon cancer Paternal Uncle    Diabetes Maternal Grandmother    Dementia Maternal Grandmother     Social History   Tobacco Use   Smoking status: Every Day    Packs/day: 0.50    Years: 26.00    Pack years: 13.00    Types: Cigarettes   Smokeless tobacco: Never  Vaping Use   Vaping Use: Former  Substance Use Topics   Alcohol use: Not Currently   Drug use: Not Currently    Types: Marijuana    Comment: sober from from drugs     Home Medications Prior to Admission medications   Medication Sig Start Date End Date Taking? Authorizing Provider  ARIPiprazole (ABILIFY) 15 MG tablet Take 1 tablet (15 mg total) by mouth daily. 01/04/21   Salley Slaughter, NP  clotrimazole (LOTRIMIN) 1 % cream Apply topically 2 (two) times daily. 12/05/20   Ethelene Hal, NP  Ferrous Sulfate (IRON) 325 (65 Fe) MG TABS Take 1 tablet by mouth daily.    [provider]  FLUoxetine (PROZAC) 20 MG capsule Take 1 capsule (20 mg total) by mouth daily. 01/04/21   Salley Slaughter, NP  hydrOXYzine (ATARAX/VISTARIL) 25 MG tablet Take 1 tablet (25 mg total) by mouth 3 (three) times daily as needed for anxiety. 01/04/21   Salley Slaughter, NP  levETIRAcetam (KEPPRA) 500 MG tablet Take 1 tablet (500 mg total) by mouth 2 (two) times daily. 12/05/20   Ethelene Hal, NP  Multiple Vitamins-Minerals (WOMENS MULTIVITAMIN PO) Take 1 tablet by mouth daily.    [provider]  traZODone (DESYREL) 100 MG tablet Take 1 tablet (100 mg total) by mouth at bedtime. 01/04/21   Salley Slaughter, NP    Allergies    Aspartame and phenylalanine,  Benadryl [diphenhydramine], Other, Scallops [shellfish allergy], Yellow jacket venom [bee venom], Pollen extract, Tegretol [carbamazepine], Adhesive [tape], and Latex  Review of Systems   Review of Systems  Constitutional:  Negative for chills and fever.  HENT:  Negative for ear pain and sore throat.   Eyes:  Negative for pain and visual disturbance.  Respiratory:  Negative for cough and shortness of breath.   Cardiovascular:  Negative for chest pain and palpitations.  Gastrointestinal:  Positive for nausea and vomiting. Negative for abdominal pain.  Genitourinary:  Negative for dysuria and hematuria.  Musculoskeletal:  Negative for arthralgias and back pain.  Skin:  Positive for rash. Negative for color change.  Neurological:  Negative for seizures and syncope.  All other systems reviewed and are negative.  Physical Exam Updated Vital Signs BP 109/70 (BP Location: Right Arm)   Pulse 66   Temp 98.5 F (36.9 C) (Oral)   Resp 14   Ht 5' 8.5" (1.74 m)   Wt 79.4 kg   SpO2 98%   BMI 26.22 kg/m   Physical Exam Vitals and nursing note reviewed.  Constitutional:      General: He is not in acute distress.    Appearance: He is well-developed.  HENT:     Head: Normocephalic and atraumatic.  Eyes:     Conjunctiva/sclera: Conjunctivae normal.  Cardiovascular:     Rate and Rhythm: Normal rate and regular rhythm.     Heart sounds: No murmur heard. Pulmonary:     Effort: Pulmonary effort is normal. No respiratory distress.     Breath sounds: Normal breath sounds.  Abdominal:     Palpations: Abdomen is soft.     Tenderness: There is no abdominal tenderness.  Musculoskeletal:     Cervical back: Neck supple.  Skin:    General: Skin is warm and dry.     Comments: Single urticarial lesion on the patient's back.  Skin otherwise intact  Neurological:     Mental Status: He is alert.    ED Results / Procedures / Treatments   Labs (all labs ordered are listed, but only abnormal  results are displayed) Labs Reviewed - No data to display  EKG None  Radiology No results found.  Procedures Procedures   Medications Ordered in ED Medications  dexamethasone (DECADRON) tablet 6 mg (6 mg Oral Given 03/10/21 0940)  hydrOXYzine (ATARAX/VISTARIL) tablet 25 mg (25 mg Oral Given 03/10/21 0940)  famotidine (PEPCID) tablet 40 mg (40 mg Oral Given 03/10/21 0940)    ED Course  I have reviewed the triage vital signs and the nursing notes.  Pertinent labs & imaging results that were available during my care of the patient were reviewed by me and considered in my medical decision making (see chart for details).    MDM Rules/Calculators/A&P                           40 year old adult presenting to the emergency department with concern for wasp sting/bite with single area of urticarial lesion to patient's back with associated 2 episodes of nausea and vomiting.  The patient presents with an allergic reaction following this insect bite.  No concern for anaphylaxis at this time.  Physical exam and vitals reassuring as per above.  Will administer H1 antagonist, H2 antagonist, corticosteroid and reassess.  Of note, the patient has an anaphylactic allergy to diphenhydramine.  Will administer hydroxyzine which patient has tolerated in the past.  On reassessment, symptoms had completely resolved.  Return precautions were provided.  Final Clinical Impression(s) / ED Diagnoses Final diagnoses:  Bee sting reaction, accidental or unintentional, initial encounter  Allergic reaction, initial encounter    Rx / DC Orders ED Discharge Orders          Ordered    EPINEPHrine 0.3 mg/0.3 mL IJ SOAJ injection  As needed,   Status:  Discontinued        03/10/21 1020  Regan Lemming, MD 03/10/21 1021

## 2021-03-10 NOTE — ED Triage Notes (Addendum)
Per EMS- Patient is homeless. Patient states she was stung by a bee on her back. Patient states she is allergic to bees. No SOB or hives.   Patient added in triage that bee stings "cause my seizures to start."

## 2021-03-21 ENCOUNTER — Other Ambulatory Visit: Payer: Self-pay

## 2021-03-21 ENCOUNTER — Ambulatory Visit (INDEPENDENT_AMBULATORY_CARE_PROVIDER_SITE_OTHER): Payer: Medicare (Managed Care) | Admitting: Licensed Clinical Social Worker

## 2021-03-21 DIAGNOSIS — F251 Schizoaffective disorder, depressive type: Secondary | ICD-10-CM
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
## 2021-04-06 ENCOUNTER — Other Ambulatory Visit: Payer: Self-pay

## 2021-04-06 ENCOUNTER — Encounter (HOSPITAL_COMMUNITY): Payer: Self-pay | Admitting: Psychiatry

## 2021-04-06 ENCOUNTER — Ambulatory Visit (INDEPENDENT_AMBULATORY_CARE_PROVIDER_SITE_OTHER): Payer: Medicare (Managed Care) | Admitting: Psychiatry

## 2021-04-06 DIAGNOSIS — F251 Schizoaffective disorder, depressive type: Secondary | ICD-10-CM | POA: Diagnosis not present

## 2021-04-06 DIAGNOSIS — F411 Generalized anxiety disorder: Secondary | ICD-10-CM

## 2021-04-06 MED ORDER — HYDROXYZINE HCL 25 MG PO TABS
25.0000 mg | ORAL_TABLET | Freq: Three times a day (TID) | ORAL | 3 refills | Status: DC | PRN
Start: 2021-04-06 — End: 2021-07-10
  Filled 2021-04-06: qty 90, 30d supply, fill #0

## 2021-04-06 MED ORDER — ARIPIPRAZOLE 15 MG PO TABS
15.0000 mg | ORAL_TABLET | Freq: Every day | ORAL | 3 refills | Status: DC
  Filled 2021-04-06: qty 30, 30d supply, fill #0

## 2021-04-06 MED ORDER — TRAZODONE HCL 100 MG PO TABS
100.0000 mg | ORAL_TABLET | Freq: Every day | ORAL | 3 refills | Status: DC
  Filled 2021-04-06: qty 30, 30d supply, fill #0

## 2021-04-06 MED ORDER — FLUOXETINE HCL 20 MG PO CAPS
20.0000 mg | ORAL_CAPSULE | Freq: Every day | ORAL | 3 refills | Status: DC
  Filled 2021-04-06: qty 30, 30d supply, fill #0

## 2021-04-06 NOTE — Progress Notes (Signed)
BH MD/PA/NP OP Progress Note  04/06/2021 3:18 PM Amanda [name]  MRN:  [id number]  Chief Complaint: "I am excited about getting my housing voucher" Chief Complaint   Medication Management    HPI: 40 year old transgender female seen today for follow up psychiatric evaluation.    He has a psychiatric history of anxiety, schizophrenia, schizoaffective disorder bipolar type, bipolar 1, borderline personality, depression OCD, SI, and PTSD.  He is currently managed on Prozac 20 mg daily, Abilify 15 mg daily, trazodone 100 mg nightly, and hydroxyzine 10 mg 3 times daily.   Today he notes his medications are effective in managing his psychiatric condition.   Today he is fairly groomed, pleasant, cooperative, engaged in conversation, and maintained eye contact.  He informed provider that recently he found out that he will be getting a housing voucher and notes that he is very excited about it.  He notes that having stable housing will help him become closer in getting his 60-year-old son back.  [place].  Patient notes he has minimal anxiety and depression.  Provider conducted a GAD-7 and patient scored a 3, at his last visit he scored a 75.  Provider also conducted a PHQ-9 and patient scored a 3, at his last visit he scored a 4.  He endorses adequate sleep and appetite.  Patient notes that he is currently on disability and receives a little over thousand dollars a month.  He informed Probation officer that he is able to get food from [place].  He notes on Friday nights he has the most difficulty getting food because no place in [place] gives free food out on Fridays.  Today he denies SI/HI/VAH, mania, or paranoia.    Overall patient notes that he is doing well.  No medication changes made today.  Patient agreeable to continue medication as prescribed and follow-up with outpatient counseling for therapy.   No other concerns noted at this time.      Visit Diagnosis:    ICD-10-CM   1. Schizoaffective disorder, depressive type ([place])  F25.1 ARIPiprazole (ABILIFY) 15 MG tablet    FLUoxetine (PROZAC) 20 MG capsule    traZODone (DESYREL) 100 MG tablet    2. GAD (generalized anxiety disorder)  F41.1 FLUoxetine (PROZAC) 20 MG capsule    hydrOXYzine (ATARAX/VISTARIL) 25 MG tablet    traZODone (DESYREL) 100 MG tablet      Past Psychiatric History: anxiety, schizophrenia, schizoaffective disorder bipolar type, bipolar 1, borderline personality, depression OCD, SI, and PTSD.  Past Medical History:  Past Medical History:  Diagnosis Date   Anxiety    Asthma    Asthma due to environmental allergies    Asthma due to seasonal allergies    Bipolar 1 disorder (HCC)    Borderline personality disorder ([place])    Brain bleed ([place])    Chronic post-traumatic stress disorder (PTSD)    complex chronic with psychotic features and self harm behaviors   Complication of anesthesia    Constipation    Dander (animal) allergy    Hearing loss    right ear   Heart murmur    denies seeing a cardiologist   Hip dysplasia    Hypertension    Gestional    Major depression, chronic    Mood disorder (HCC)    OCD (obsessive compulsive disorder)    Pneumonia    PONV (postoperative nausea and vomiting)    PTSD (post-traumatic stress  disorder)    RA (rheumatoid arthritis) ([place])    Rheumatoid arthritis ([place])    Schizophrenia ([place])    "I think that is wrong"   Seizure disorder ([place])    Suicidal ideations    Suicide attempt [facility])     Past Surgical History:  Procedure Laterality Date   Brain Shunt  1981   "a few hours old"   EYE SURGERY Bilateral [place] (IUD) INSERTION N/A 09/16/2018   Procedure: INTRAUTERINE DEVICE (IUD) INSERTION;  Surgeon: Lavonia Drafts, MD;  Location: [facility];  Service: Gynecology;  Laterality: N/A;   PERINEUM REPAIR N/A 09/16/2018   Procedure: EPISIOTOMY REVISION;  Surgeon: Lavonia Drafts, MD;   Location: [facility];  Service: Gynecology;  Laterality: N/A;   SHUNT REMOVAL  1983   Tooth Removal     multiple    Family Psychiatric History: Father alcohol use  Family History:  Family History  Problem Relation Age of Onset   Heart attack Mother    Stroke Mother    Liver cancer Mother    Diabetes Mother    Lung cancer Mother    Alcoholism Father    Sleep apnea Brother    Depression Brother    ADD / ADHD Brother    Diabetes Maternal Aunt    Diabetes Paternal Uncle    Colon cancer Paternal Uncle    Diabetes Maternal Grandmother    Dementia Maternal Grandmother     Social History:  Social History   Socioeconomic History   Marital status: Single    Spouse name: Not on file   Number of children: 1   Years of education: 12   Highest education level: 12th grade  Occupational History   Not on file  Tobacco Use   Smoking status: Every Day    Packs/day: 0.50    Years: 26.00    Pack years: 13.00    Types: Cigarettes   Smokeless tobacco: Never  Vaping Use   Vaping Use: Former  Substance and Sexual Activity   Alcohol use: Not Currently   Drug use: Not Currently    Types: Marijuana    Comment: sober from from drugs    Sexual activity: Not Currently    Birth control/protection: None  Other Topics Concern   Not on file  Social History Narrative   ** Merged History Encounter **       Lives with her fiance, friend, and fiance's brother   Right handed   Social Determinants of Health   Financial Resource Strain: Low Risk    Difficulty of Paying Living Expenses: Not hard at all  Food Insecurity: No Food Insecurity   Worried About Charity fundraiser in the Last Year: Never true   Arboriculturist in the Last Year: Never true  Transportation Needs: Unmet Transportation Needs   Lack of Transportation (Medical): Yes   Lack of Transportation (Non-Medical): Yes  Physical Activity: Insufficiently Active   Days of Exercise per Week: 3 days   Minutes of Exercise per  Session: 40 min  Stress: No Stress Concern Present   Feeling of Stress : Not at all  Social Connections: Moderately Integrated   Frequency of Communication with Friends and Family: Three times a week   Frequency of Social Gatherings with Friends and Family: More than three times a week   Attends Religious Services: Never   Marine scientist or Organizations: Yes   Attends Music therapist: More than 4 times per  year   Marital Status: Married    Allergies:  Allergies  Allergen Reactions   Aspartame And Phenylalanine Anaphylaxis, Hives and Diarrhea   Benadryl [Diphenhydramine] Anaphylaxis, Diarrhea and Other (See Comments)    Blisters   Other Anaphylaxis, Nausea And Vomiting, Rash and Other (See Comments)    Aspartame- Blisters Dust- Worsens asthma Ragweed- Worsens asthma, face gets red, and sneezing Animal Fur/Dander- Worsens asthma and sneezing     Scallops [Shellfish Allergy] Anaphylaxis, Diarrhea and Nausea And Vomiting   Yellow Jacket Venom [Bee Venom] Anaphylaxis, Diarrhea and Nausea And Vomiting    Seizures and numbness   Pollen Extract Other (See Comments)    Runny nose, eyes, and asthma worsens   Tegretol [Carbamazepine] Hives, Diarrhea and Other (See Comments)    Blisters in mouth and increase in seizures   Adhesive [Tape] Rash   Latex Hives and Rash    Blisters, also- Condoms and dental encounters    Metabolic Disorder Labs: Lab Results  Component Value Date   HGBA1C 5.5 12/03/2020   MPG 111 12/03/2020   MPG 105.41 11/11/2018   Lab Results  Component Value Date   PROLACTIN 36.9 (H) 11/11/2018   Lab Results  Component Value Date   CHOL 180 12/03/2020   TRIG 147 12/03/2020   HDL 43 12/03/2020   CHOLHDL 4.2 12/03/2020   VLDL 29 12/03/2020   LDLCALC 108 (H) 12/03/2020   Lab Results  Component Value Date   TSH 2.786 11/11/2018    Therapeutic Level Labs: No results found for: LITHIUM No results found for: VALPROATE No components  found for:  CBMZ  Current Medications: Current Outpatient Medications  Medication Sig Dispense Refill   clotrimazole (LOTRIMIN) 1 % cream Apply topically 2 (two) times daily. 30 g 0   Ferrous Sulfate (IRON) 325 (65 Fe) MG TABS Take 1 tablet by mouth daily.     levETIRAcetam (KEPPRA) 500 MG tablet Take 1 tablet (500 mg total) by mouth 2 (two) times daily. 60 tablet 0   Multiple Vitamins-Minerals (WOMENS MULTIVITAMIN PO) Take 1 tablet by mouth daily.     ARIPiprazole (ABILIFY) 15 MG tablet Take 1 tablet (15 mg total) by mouth daily. 30 tablet 3   FLUoxetine (PROZAC) 20 MG capsule Take 1 capsule (20 mg total) by mouth daily. 30 capsule 3   hydrOXYzine (ATARAX/VISTARIL) 25 MG tablet Take 1 tablet (25 mg total) by mouth 3 (three) times daily as needed for anxiety. 90 tablet 3   traZODone (DESYREL) 100 MG tablet Take 1 tablet (100 mg total) by mouth at bedtime. 30 tablet 3   No current facility-administered medications for this visit.     Musculoskeletal: Strength & Muscle Tone: within normal limits Gait & Station: normal Patient leans: N/A  Psychiatric Specialty Exam: Review of Systems  Blood pressure 132/73, pulse 62, height '5\' 7"'$  (1.702 m), weight 177 lb (80.3 kg).Body mass index is 27.72 kg/m.  General Appearance: Fairly Groomed  Eye Contact:  Good  Speech:  Clear and Coherent and Normal Rate  Volume:  Normal  Mood:  Euthymic  Affect:  Appropriate and Congruent  Thought Process:  Coherent, Goal Directed and Linear  Orientation:  Full (Time, Place, and Person)  Thought Content: WDL and Logical   Suicidal Thoughts:  No  Homicidal Thoughts:  No  Memory:  Immediate;   Good Recent;   Good Remote;   Good  Judgement:  Good  Insight:  Good  Psychomotor Activity:  Normal  Concentration:  Concentration: Good and Attention  Span: Good  Recall:  Good  Fund of Knowledge: Good  Language: Good  Akathisia:  No  Handed:  Right  AIMS (if indicated): Not done  Assets:  Communication  Skills Desire for Improvement Financial Resources/Insurance Housing Social Support  ADL's:  Intact  Cognition: WNL  Sleep:  Good   Screenings: AIMS    Flowsheet Row Admission (Discharged) from 11/30/2020 in [facility] Admission (Discharged) from 11/01/2019 in [facility] Admission (Discharged) from 11/10/2018 in [facility]  AIMS Total Score 0 0 0      AUDIT    Flowsheet Row Admission (Discharged) from 11/01/2019 in [facility] Admission (Discharged) from 11/10/2018 in [facility]  Alcohol Use Disorder Identification Test Final Score (AUDIT) 5 0      GAD-7    Flowsheet Row Clinical Support from 04/06/2021 in [facility] from 01/04/2021 in [facility] from 10/12/2020 in [facility] from 07/11/2020 in [facility] from 09/01/2018 in [facility]  Total GAD-7 Score '3 16 21 16 7      '$ [place] [facility] Office Visit from 09/28/2019 in [facility]  Total Score (max 30 points ) 17      PHQ2-9    Flowsheet Row Clinical Support from 04/06/2021 in [facility] from 01/04/2021 in [facility] from 10/12/2020 in [facility] from 07/11/2020 in [facility] Counselor from 07/01/2020 in [facility]  PHQ-2 Total Score 0 '2 1 3 '$ 0  PHQ-9 Total Score '3 4 2 12 '$ --      Flowsheet Row ED from 03/10/2021 in [facility] DEPT Clinical Support from 01/04/2021 in [facility] Admission (Discharged) from 11/30/2020 in [facility]  C-SSRS RISK CATEGORY No Risk Error: Question 6 not populated High Risk        Assessment and Plan: Patient notes he is doing well on his current medication regimen.  No medication changes made today.  Patient agreeable to continue medications as prescribed.   1. Schizoaffective disorder, depressive type ([place])  Continue- traZODone (DESYREL) 100 MG tablet; Take 1 tablet (100 mg total) by mouth at bedtime.  Dispense: 30 tablet; Refill: 2 Continue- FLUoxetine (PROZAC) 20 MG capsule; Take 1 capsule (20 mg total) by mouth daily.  Dispense: 30 capsule; Refill: 2 Continue- ARIPiprazole (ABILIFY) 15 MG tablet; Take 1 tablet (15 mg total) by mouth daily.  Dispense: 30 tablet; Refill: 2  2. GAD (generalized anxiety disorder)  Continue- traZODone (DESYREL) 100 MG tablet; Take 1 tablet (100 mg total) by mouth at bedtime.  Dispense: 30 tablet; Refill: 2 Continue- hydrOXYzine (ATARAX/VISTARIL) 25 MG tablet; Take 1 tablet (25 mg total) by mouth 3 (three) times daily as needed for anxiety.  Dispense: 90 tablet; Refill: 2 Continue- FLUoxetine (PROZAC) 20 MG capsule; Take 1 capsule (20 mg total) by mouth daily.  Dispense: 30 capsule; Refill: 2    Follow-up in 3 months Follow-up with therapy  Salley Slaughter, NP 04/06/2021, 3:18 PM

## 2021-04-07 ENCOUNTER — Other Ambulatory Visit: Payer: Self-pay

## 2021-04-13 ENCOUNTER — Other Ambulatory Visit: Payer: Self-pay

## 2021-04-14 ENCOUNTER — Other Ambulatory Visit: Payer: Self-pay

## 2021-04-25 ENCOUNTER — Ambulatory Visit (INDEPENDENT_AMBULATORY_CARE_PROVIDER_SITE_OTHER): Payer: Medicare (Managed Care) | Admitting: Licensed Clinical Social Worker

## 2021-04-25 ENCOUNTER — Other Ambulatory Visit: Payer: Self-pay

## 2021-04-25 DIAGNOSIS — F411 Generalized anxiety disorder: Secondary | ICD-10-CM

## 2021-04-25 DIAGNOSIS — F251 Schizoaffective disorder, depressive type: Secondary | ICD-10-CM | POA: Diagnosis not present

## 2021-04-25 NOTE — Progress Notes (Signed)
   THERAPIST PROGRESS NOTE  Session Time: 30  Participation Level: Active  Behavioral Response: CasualAlertAnxious  Type of Therapy: Individual Therapy  Treatment Goals addressed: Anxiety and Coping  Interventions: CBT and Supportive  Summary: Amanda [name] is a 40 y.o. adult who presents with euthymic mood\affect.  Patient presented today as alert and oriented x5.  Was dressed casually and engaged well in therapy session..  Patient comes in today with primary stressors as legal, housing, financial.  Patient reports that he is still struggling finding housing.  In order to get full custody of his son back patient needs to 1 attend parenting classes to manage mental health with therapy and medication management and 3 maintain proper housing.  Patient has done 2 out of the 3 requirements by Department of Social Services however is still struggling with housing.  Amanda [name] is connected with "partners against homelessness" and is on 5-10 wait list for housing.  He currently has a Printmaker of $950.  Patient's next custody hearing is in 6 weeks goal is for patient to have housing lined up at that time.  Amanda [name] reports grief and loss as another stressor in today's session.  He reports that his aunt recently died in the last 28 weeks.  He reports that he will not be attending the funeral.  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  Amanda [name] continues to struggle with personal hygiene as evidenced by bodily older from patient in today's session.  LCSW spoke with patient about daily hygiene such as sponge baths.  Suicidal/Homicidal: Nowithout intent/plan  Therapist Response:   Intervention/Plan: LCSW utilized solution focused therapy, cognitive behavioral therapy, and person centered therapy in today's session.  Supportive  therapy was utilized for praise, encouragement, and advice giving.  LCSW utilized unconditional positive regard, LCSW utilized being gentleman, LCSW used Engineering geologist.  LCSW spoke with patient about cognitive distortions as patient continued to believe that her son had a will see the behavior such as organizing colored markers at the age of 85.  LCSW utilized psychological education in today's session for different learning stages of children.  Plan: Return again in 4 weeks.      [name], LCSW 04/25/2021

## 2021-05-18 ENCOUNTER — Other Ambulatory Visit: Payer: Self-pay

## 2021-05-18 ENCOUNTER — Other Ambulatory Visit: Payer: Self-pay | Admitting: Family Medicine

## 2021-05-18 ENCOUNTER — Ambulatory Visit
Admission: RE | Admit: 2021-05-18 | Discharge: 2021-05-18 | Disposition: A | Discharge status: Home / Self Care | Payer: Medicare (Managed Care) | Source: Ambulatory Visit | Attending: Family Medicine | Admitting: Family Medicine

## 2021-05-18 DIAGNOSIS — Z1231 Encounter for screening mammogram for malignant neoplasm of breast: Secondary | ICD-10-CM

## 2021-05-24 ENCOUNTER — Other Ambulatory Visit: Payer: Self-pay

## 2021-05-24 ENCOUNTER — Ambulatory Visit (INDEPENDENT_AMBULATORY_CARE_PROVIDER_SITE_OTHER): Payer: Medicare (Managed Care) | Admitting: Licensed Clinical Social Worker

## 2021-05-24 DIAGNOSIS — F251 Schizoaffective disorder, depressive type: Secondary | ICD-10-CM | POA: Diagnosis not present

## 2021-05-24 NOTE — Progress Notes (Signed)
   THERAPIST PROGRESS NOTE  Session Time: 69  Participation Level: Active  Behavioral Response: Fairly GroomedAlertDepressed  Type of Therapy: Individual Therapy  Treatment Goals addressed: Diagnosis: schizoaffective disorder depression  Interventions: CBT and Supportive  Summary: Amanda [name] is a 40 y.o. adult who presents with euthymic mood\affect.  Patient was pleasant, cooperative, and maintained good eye contact.  Amanda [name] was alert and oriented x5.  He engaged well in therapy session and was dressed casually.  Primary stressor for patient is housing.  Patient reports that he has been approved for a housing voucher but has unable to use it as most of the housing agencies that take the voucher are filled up and have a 6 to 33-month waiting list.  LCSW utilized solution focused therapy as primary intervention in today's session.  LCSW offered resources for for subsidized housing in the area.  LCSW also offered patient resources to the [employer] in the [place] area.  Patient states that she will follow-up with them tomorrow.  Suicidal/Homicidal: NAwithout intent/plan  Therapist Response:    Intervention/Plan: Interventions utilized in today's session was solution focused therapy.  This is as evidenced by note written above.  Other interventions utilized were for supportive and person centered therapy for genuineness,  praise, and, empowerment.   Plan: Return again in 4 weeks.     [name], LCSW 05/24/2021

## 2021-06-27 ENCOUNTER — Other Ambulatory Visit: Payer: Self-pay

## 2021-06-27 ENCOUNTER — Ambulatory Visit (INDEPENDENT_AMBULATORY_CARE_PROVIDER_SITE_OTHER): Payer: Medicare (Managed Care) | Admitting: Licensed Clinical Social Worker

## 2021-06-27 DIAGNOSIS — F411 Generalized anxiety disorder: Secondary | ICD-10-CM

## 2021-06-27 DIAGNOSIS — F251 Schizoaffective disorder, depressive type: Secondary | ICD-10-CM

## 2021-06-27 NOTE — Progress Notes (Signed)
   THERAPIST PROGRESS NOTE  Session Time: 58  Participation Level: Active  Behavioral Response: CasualAlertAnxious and Depressed  Type of Therapy: Individual Therapy  Treatment Goals addressed: Anxiety and Coping  Interventions: CBT and Supportive  Summary: Amanda [name] is a 40 y.o. adult who presents with euthymic mood\affect.  He was pleasant, cooperative, and maintained good eye contact.  Amanda [name] was alert and oriented x5.  He was dressed casually and engaged well in therapy session.  Patient started off by saying to LCSW everything has been going "great".  Patient reports that he has obtained housing in the last 30 days.  This was last that needed to present a case to the court to get full-time custody of his child back.  Patient reports stress and tension as there are still some little details to get "ironed out".  Patient reports that he still needs to get furniture for the house.  He also needs to attempt to get 9 supervised visits but this needs to go through a judge.  Patient states that there is some tension and worry to get custody of her child back such as figuring out childcare and also figuring out the child scheduled.  Amanda [name] reports that this will all, and time but there are still thinks that need to be accomplished before his court hearing in 3 weeks.  Patient reports primary concern is obtaining furniture he is currently working with DSS to fill out paperwork to get furniture through a resource agency  Suicidal/Homicidal: NAwithout intent/plan  Therapist Response:    Intervention/Plan: LCSW administered a PHQ-9 patient scored a 2 in today's session average score through the last 5 PHQ-9's is a 4.  LCSW also administered a GAD-7 patient has been being seen by [facility] since November 2021 patient's original score was a 21 for GAD-7 current score is a 6.  LCSW utilized supportive therapy for praise and encouragement.  LCSW administered person  centered therapy for empowerment and genuineness in session.  Plan for patient is to continue to journal at least 31 times per week while still getting the house\apartment together for things such as furniture.  Plan: Return again in 4 weeks.      [name], LCSW 06/27/2021

## 2021-07-10 ENCOUNTER — Encounter (HOSPITAL_COMMUNITY): Payer: Self-pay | Admitting: Psychiatry

## 2021-07-10 ENCOUNTER — Other Ambulatory Visit: Payer: Self-pay

## 2021-07-10 ENCOUNTER — Ambulatory Visit (INDEPENDENT_AMBULATORY_CARE_PROVIDER_SITE_OTHER): Payer: Medicare (Managed Care) | Admitting: Psychiatry

## 2021-07-10 DIAGNOSIS — F251 Schizoaffective disorder, depressive type: Secondary | ICD-10-CM | POA: Diagnosis not present

## 2021-07-10 DIAGNOSIS — F411 Generalized anxiety disorder: Secondary | ICD-10-CM

## 2021-07-10 MED ORDER — HYDROXYZINE HCL 25 MG PO TABS
25.0000 mg | ORAL_TABLET | Freq: Three times a day (TID) | ORAL | 3 refills | Status: DC | PRN
Start: 2021-07-10 — End: 2021-08-16
  Filled 2021-07-10: qty 90, 30d supply, fill #0

## 2021-07-10 MED ORDER — TRAZODONE HCL 100 MG PO TABS
100.0000 mg | ORAL_TABLET | Freq: Every day | ORAL | 3 refills | Status: DC
  Filled 2021-07-10: qty 30, 30d supply, fill #0

## 2021-07-10 MED ORDER — ARIPIPRAZOLE 15 MG PO TABS
15.0000 mg | ORAL_TABLET | Freq: Every day | ORAL | 3 refills | Status: DC
Start: 2021-07-10 — End: 2021-08-01
  Filled 2021-07-10: qty 30, 30d supply, fill #0

## 2021-07-10 MED ORDER — FLUOXETINE HCL 20 MG PO CAPS
20.0000 mg | ORAL_CAPSULE | Freq: Every day | ORAL | 3 refills | Status: DC
  Filled 2021-07-10: qty 30, 30d supply, fill #0

## 2021-07-10 NOTE — Progress Notes (Signed)
BH MD/PA/NP OP Progress Note  07/10/2021 4:48 PM Alle Meryle Pugmire  MRN:  [id number]  Chief Complaint: "I am nervous about court tomorrow" Chief Complaint   Medication Management    HPI: 40 year old transgender female seen today for follow up psychiatric evaluation.    He has a psychiatric history of anxiety, schizophrenia, schizoaffective disorder bipolar type, bipolar 1, borderline personality, depression OCD, SI, and PTSD.  He is currently managed on Prozac 20 mg daily, Abilify 15 mg daily, trazodone 100 mg nightly, and hydroxyzine 10 mg 3 times daily.   Today he notes his medications are effective in managing his psychiatric condition.   Today he is well groomed, pleasant, cooperative, engaged in conversation, and maintained eye contact.  He informed provider that he is nervous about his upcoming court case tomorrow.  Per patient this case will determine if and when he will get custody of his 73-year-old son.  He notes that since his last visit he has gotten his housing voucher and is now living in an apartment.  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  Patient informed writer that his mood is stable and he has minimal anxiety/depression.  Provider conducted a GAD-7 and patient scored a 6, at his last visit he scored a 3.  Provider also conducted a PHQ-9 of and patient scored 2, at his last visit he scored a 3.  He notes that at times he forgets to take his trazodone has poor sleep but notes that when he takes it he sleeps well.  He endorses having a good appetite.  Today he denies SI/HI/VAH, mania, paranoia.   Patient informed Probation officer that his dog Wynonia Musty is doing well and his son Shirlee More is thriving as well.   At this time no medication changes made.  Patient agreeable to continue medications as prescribed.  He will follow-up with outpatient counseling for therapy.  No other concerns at this time.  .    Visit Diagnosis:    ICD-10-CM   1. Schizoaffective disorder, depressive type ([place])   F25.1 ARIPiprazole (ABILIFY) 15 MG tablet    FLUoxetine (PROZAC) 20 MG capsule    traZODone (DESYREL) 100 MG tablet    2. GAD (generalized anxiety disorder)  F41.1 FLUoxetine (PROZAC) 20 MG capsule    hydrOXYzine (ATARAX/VISTARIL) 25 MG tablet    traZODone (DESYREL) 100 MG tablet      Past Psychiatric History: anxiety, schizophrenia, schizoaffective disorder bipolar type, bipolar 1, borderline personality, depression OCD, SI, and PTSD.  Past Medical History:  Past Medical History:  Diagnosis Date   Anxiety    Asthma    Asthma due to environmental allergies    Asthma due to seasonal allergies    Bipolar 1 disorder (HCC)    Borderline personality disorder ([place])    Brain bleed ([place])    Chronic post-traumatic stress disorder (PTSD)    complex chronic with psychotic features and self harm behaviors   Complication of anesthesia    Constipation    Dander (animal) allergy    Hearing loss    right ear   Heart murmur    denies seeing a cardiologist   Hip dysplasia    Hypertension    Gestional    Major depression, chronic    Mood disorder (HCC)    OCD (obsessive compulsive disorder)    Pneumonia    PONV (postoperative nausea and vomiting)    PTSD (post-traumatic stress disorder)    RA (rheumatoid arthritis) (HCC)    Rheumatoid arthritis (  [place])    Schizophrenia ([place])    "I think that is wrong"   Seizure disorder ([place])    Suicidal ideations    Suicide attempt [facility])     Past Surgical History:  Procedure Laterality Date   Brain Shunt  1981   "a few hours old"   EYE SURGERY Bilateral [place] (IUD) INSERTION N/A 09/16/2018   Procedure: INTRAUTERINE DEVICE (IUD) INSERTION;  Surgeon: Lavonia Drafts, MD;  Location: [facility];  Service: Gynecology;  Laterality: N/A;   PERINEUM REPAIR N/A 09/16/2018   Procedure: EPISIOTOMY REVISION;  Surgeon: Lavonia Drafts, MD;  Location: [facility];  Service: Gynecology;  Laterality: N/A;   SHUNT REMOVAL  1983   Tooth  Removal     multiple    Family Psychiatric History: Father alcohol use  Family History:  Family History  Problem Relation Age of Onset   Heart attack Mother    Stroke Mother    Liver cancer Mother    Diabetes Mother    Lung cancer Mother    Alcoholism Father    Sleep apnea Brother    Depression Brother    ADD / ADHD Brother    Diabetes Maternal Aunt    Diabetes Paternal Uncle    Colon cancer Paternal Uncle    Diabetes Maternal Grandmother    Dementia Maternal Grandmother     Social History:  Social History   Socioeconomic History   Marital status: Single    Spouse name: Not on file   Number of children: 1   Years of education: 12   Highest education level: 12th grade  Occupational History   Not on file  Tobacco Use   Smoking status: Every Day    Packs/day: 0.50    Years: 26.00    Pack years: 13.00    Types: Cigarettes   Smokeless tobacco: Never  Vaping Use   Vaping Use: Former  Substance and Sexual Activity   Alcohol use: Not Currently   Drug use: Not Currently    Types: Marijuana    Comment: sober from from drugs    Sexual activity: Not Currently    Birth control/protection: None  Other Topics Concern   Not on file  Social History Narrative   ** Merged History Encounter **       Lives with her fiance, friend, and fiance's brother   Right handed   Social Determinants of Health   Financial Resource Strain: Not on file  Food Insecurity: Not on file  Transportation Needs: Not on file  Physical Activity: Not on file  Stress: Not on file  Social Connections: Not on file    Allergies:  Allergies  Allergen Reactions   Aspartame And Phenylalanine Anaphylaxis, Hives and Diarrhea   Benadryl [Diphenhydramine] Anaphylaxis, Diarrhea and Other (See Comments)    Blisters   Other Anaphylaxis, Nausea And Vomiting, Rash and Other (See Comments)    Aspartame- Blisters Dust- Worsens asthma Ragweed- Worsens asthma, face gets red, and sneezing Animal  Fur/Dander- Worsens asthma and sneezing     Scallops [Shellfish Allergy] Anaphylaxis, Diarrhea and Nausea And Vomiting   Yellow Jacket Venom [Bee Venom] Anaphylaxis, Diarrhea and Nausea And Vomiting    Seizures and numbness   Pollen Extract Other (See Comments)    Runny nose, eyes, and asthma worsens   Tegretol [Carbamazepine] Hives, Diarrhea and Other (See Comments)    Blisters in mouth and increase in seizures   Adhesive [Tape] Rash   Latex Hives and Rash  Blisters, also- Condoms and dental encounters    Metabolic Disorder Labs: Lab Results  Component Value Date   HGBA1C 5.5 12/03/2020   MPG 111 12/03/2020   MPG 105.41 11/11/2018   Lab Results  Component Value Date   PROLACTIN 36.9 (H) 11/11/2018   Lab Results  Component Value Date   CHOL 180 12/03/2020   TRIG 147 12/03/2020   HDL 43 12/03/2020   CHOLHDL 4.2 12/03/2020   VLDL 29 12/03/2020   LDLCALC 108 (H) 12/03/2020   Lab Results  Component Value Date   TSH 2.786 11/11/2018    Therapeutic Level Labs: No results found for: LITHIUM No results found for: VALPROATE No components found for:  CBMZ  Current Medications: Current Outpatient Medications  Medication Sig Dispense Refill   clotrimazole (LOTRIMIN) 1 % cream Apply topically 2 (two) times daily. 30 g 0   Ferrous Sulfate (IRON) 325 (65 Fe) MG TABS Take 1 tablet by mouth daily.     levETIRAcetam (KEPPRA) 500 MG tablet Take 1 tablet (500 mg total) by mouth 2 (two) times daily. 60 tablet 0   Multiple Vitamins-Minerals (WOMENS MULTIVITAMIN PO) Take 1 tablet by mouth daily.     ARIPiprazole (ABILIFY) 15 MG tablet Take 1 tablet (15 mg total) by mouth daily. 30 tablet 3   FLUoxetine (PROZAC) 20 MG capsule Take 1 capsule (20 mg total) by mouth daily. 30 capsule 3   hydrOXYzine (ATARAX/VISTARIL) 25 MG tablet Take 1 tablet (25 mg total) by mouth 3 (three) times daily as needed for anxiety. 90 tablet 3   traZODone (DESYREL) 100 MG tablet Take 1 tablet (100 mg total)  by mouth at bedtime. 30 tablet 3   No current facility-administered medications for this visit.     Musculoskeletal: Strength & Muscle Tone: within normal limits Gait & Station: normal Patient leans: N/A  Psychiatric Specialty Exam: Review of Systems  Blood pressure 129/76, pulse 72, height 5\' 8"  (1.727 m), weight 177 lb (80.3 kg).Body mass index is 26.91 kg/m.  General Appearance: Well Groomed  Eye Contact:  Good  Speech:  Clear and Coherent and Normal Rate  Volume:  Normal  Mood:  Euthymic  Affect:  Appropriate and Congruent  Thought Process:  Coherent, Goal Directed and Linear  Orientation:  Full (Time, Place, and Person)  Thought Content: WDL and Logical   Suicidal Thoughts:  No  Homicidal Thoughts:  No  Memory:  Immediate;   Good Recent;   Good Remote;   Good  Judgement:  Good  Insight:  Good  Psychomotor Activity:  Normal  Concentration:  Concentration: Good and Attention Span: Good  Recall:  Good  Fund of Knowledge: Good  Language: Good  Akathisia:  No  Handed:  Right  AIMS (if indicated): Not done  Assets:  Communication Skills Desire for Improvement Financial Resources/Insurance Housing Social Support  ADL's:  Intact  Cognition: WNL  Sleep:  Good   Screenings: AIMS    Flowsheet Row Admission (Discharged) from 11/30/2020 in [facility] Admission (Discharged) from 11/01/2019 in [place] [facility] Admission (Discharged) from 11/10/2018 in [place] [facility]  AIMS Total Score 0 0 0      AUDIT    Flowsheet Row Admission (Discharged) from 11/01/2019 in [place] [facility] Admission (Discharged) from 11/10/2018 in [place] [facility]  Alcohol Use Disorder Identification Test Final Score (AUDIT) 5 0      GAD-7  Flowsheet Row Clinical Support from 07/10/2021 in [facility] Counselor from  06/27/2021 in [place] from 04/06/2021 in [place] from 01/04/2021 in [facility] Clinical Support from 10/12/2020 in [facility]  Total GAD-7 Score 5 6 3 16 21       [place] Office Visit from 09/28/2019 in [facility]  Total Score (max 30 points ) 17      PHQ2-9    Flowsheet Row Clinical Support from 07/10/2021 in [facility] Counselor from 06/27/2021 in [place] from 04/06/2021 in [place] from 01/04/2021 in [place] from 10/12/2020 in [facility]  PHQ-2 Total Score 0 0 0 2 1  PHQ-9 Total Score 1 2 3 4 2       Flowsheet Row ED from 03/10/2021 in [facility] Clinical Support from 01/04/2021 in [facility] Admission (Discharged) from 11/30/2020 in [place] [facility]  C-SSRS RISK CATEGORY No Risk Error: Question 6 not populated High Risk        Assessment and Plan: Patient notes he is doing well on his current medication regimen.  No medication changes made today.  Patient agreeable to continue medications as prescribed.   1. Schizoaffective disorder, depressive type ([place])  Continue- traZODone (DESYREL) 100 MG tablet; Take 1 tablet (100 mg total) by mouth at bedtime.  Dispense: 30 tablet; Refill: 3 Continue- FLUoxetine (PROZAC) 20 MG capsule; Take 1 capsule (20 mg total) by mouth daily.  Dispense: 30 capsule; Refill: 3 Continue- ARIPiprazole (ABILIFY) 15 MG tablet; Take 1 tablet (15 mg total) by mouth daily.  Dispense: 30 tablet; Refill: 3  2. GAD (generalized anxiety disorder)  Continue- traZODone (DESYREL) 100  MG tablet; Take 1 tablet (100 mg total) by mouth at bedtime.  Dispense: 30 tablet; Refill: 3 Continue- hydrOXYzine (ATARAX/VISTARIL) 25 MG tablet; Take 1 tablet (25 mg total) by mouth 3 (three) times daily as needed for anxiety.  Dispense: 90 tablet; Refill: 3 Continue- FLUoxetine (PROZAC) 20 MG capsule; Take 1 capsule (20 mg total) by mouth daily.  Dispense: 30 capsule; Refill: 3    Follow-up in 3 months Follow-up with therapy  Salley Slaughter, NP 07/10/2021, 4:48 PM

## 2021-07-11 ENCOUNTER — Other Ambulatory Visit: Payer: Self-pay

## 2021-07-18 ENCOUNTER — Other Ambulatory Visit: Payer: Self-pay

## 2021-07-26 ENCOUNTER — Ambulatory Visit (HOSPITAL_COMMUNITY)
Admission: EM | Admit: 2021-07-26 | Discharge: 2021-07-27 | Disposition: A | Discharge status: Other Acute Inpatient Hospital | Payer: Medicare (Managed Care) | Attending: Family | Admitting: Family

## 2021-07-26 ENCOUNTER — Ambulatory Visit (INDEPENDENT_AMBULATORY_CARE_PROVIDER_SITE_OTHER): Payer: Medicare (Managed Care) | Admitting: Licensed Clinical Social Worker

## 2021-07-26 ENCOUNTER — Encounter (HOSPITAL_COMMUNITY): Payer: Self-pay | Admitting: Emergency Medicine

## 2021-07-26 ENCOUNTER — Other Ambulatory Visit: Payer: Self-pay

## 2021-07-26 DIAGNOSIS — F251 Schizoaffective disorder, depressive type: Secondary | ICD-10-CM

## 2021-07-26 DIAGNOSIS — Z20822 Contact with and (suspected) exposure to covid-19: Secondary | ICD-10-CM | POA: Insufficient documentation

## 2021-07-26 DIAGNOSIS — Z9151 Personal history of suicidal behavior: Secondary | ICD-10-CM | POA: Insufficient documentation

## 2021-07-26 DIAGNOSIS — Z79899 Other long term (current) drug therapy: Secondary | ICD-10-CM | POA: Insufficient documentation

## 2021-07-26 DIAGNOSIS — F332 Major depressive disorder, recurrent severe without psychotic features: Secondary | ICD-10-CM

## 2021-07-26 DIAGNOSIS — R45851 Suicidal ideations: Secondary | ICD-10-CM | POA: Diagnosis not present

## 2021-07-26 DIAGNOSIS — F17210 Nicotine dependence, cigarettes, uncomplicated: Secondary | ICD-10-CM | POA: Diagnosis not present

## 2021-07-26 LAB — COMPREHENSIVE METABOLIC PANEL
ALT: 9 U/L (ref 0–44)
AST: 15 U/L (ref 15–41)
Albumin: 3.6 g/dL (ref 3.5–5.0)
Alkaline Phosphatase: 79 U/L (ref 38–126)
Anion gap: 9 (ref 5–15)
BUN: 16 mg/dL (ref 6–20)
CO2: 23 mmol/L (ref 22–32)
Calcium: 9 mg/dL (ref 8.9–10.3)
Chloride: 107 mmol/L (ref 98–111)
Creatinine, Ser: 0.78 mg/dL (ref 0.44–1.00)
GFR, Estimated: >60 mL/min (ref >60)
Glucose, Bld: 92 mg/dL (ref 70–99)
Potassium: 3.6 mmol/L (ref 3.5–5.1)
Sodium: 139 mmol/L (ref 135–145)
Total Bilirubin: 0.4 mg/dL (ref 0.3–1.2)
Total Protein: 6.3 g/dL — ABNORMAL LOW (ref 6.5–8.1)

## 2021-07-26 LAB — POCT PREGNANCY, URINE: Preg Test, Ur: NEGATIVE

## 2021-07-26 LAB — CBC WITH DIFFERENTIAL/PLATELET
Abs Immature Granulocytes: 0.05 10*3/uL (ref 0.00–0.07)
Basophils Absolute: 0.1 10*3/uL (ref 0.0–0.1)
Basophils Relative: 0 %
Eosinophils Absolute: 0.3 10*3/uL (ref 0.0–0.5)
Eosinophils Relative: 2 %
HCT: 45.1 % (ref 36.0–46.0)
Hemoglobin: 15.5 g/dL — ABNORMAL HIGH (ref 12.0–15.0)
Immature Granulocytes: 0 %
Lymphocytes Relative: 32 %
Lymphs Abs: 4.6 10*3/uL — ABNORMAL HIGH (ref 0.7–4.0)
MCH: 31.8 pg (ref 26.0–34.0)
MCHC: 34.4 g/dL (ref 30.0–36.0)
MCV: 92.6 fL (ref 80.0–100.0)
Monocytes Absolute: 0.9 10*3/uL (ref 0.1–1.0)
Monocytes Relative: 6 %
Neutro Abs: 8.4 10*3/uL — ABNORMAL HIGH (ref 1.7–7.7)
Neutrophils Relative %: 60 %
Platelets: 302 10*3/uL (ref 150–400)
RBC: 4.87 MIL/uL (ref 3.87–5.11)
RDW: 13.2 % (ref 11.5–15.5)
WBC: 14.2 10*3/uL — ABNORMAL HIGH (ref 4.0–10.5)
nRBC: 0 % (ref 0.0–0.2)

## 2021-07-26 LAB — ETHANOL: Alcohol, Ethyl (B): <10 mg/dL (ref <10)

## 2021-07-26 LAB — POCT URINE DRUG SCREEN - MANUAL ENTRY (I-SCREEN)
POC Amphetamine UR: NOT DETECTED
POC Buprenorphine (BUP): NOT DETECTED
POC Cocaine UR: NOT DETECTED
POC Marijuana UR: NOT DETECTED
POC Methadone UR: NOT DETECTED
POC Methamphetamine UR: NOT DETECTED
POC Morphine: NOT DETECTED
POC Oxazepam (BZO): NOT DETECTED
POC Oxycodone UR: NOT DETECTED
POC Secobarbital (BAR): NOT DETECTED

## 2021-07-26 LAB — LIPID PANEL
Cholesterol: 161 mg/dL (ref 0–200)
HDL: 36 mg/dL — ABNORMAL LOW (ref >40)
LDL Cholesterol: 102 mg/dL — ABNORMAL HIGH (ref 0–99)
Total CHOL/HDL Ratio: 4.5 RATIO
Triglycerides: 116 mg/dL (ref <150)
VLDL: 23 mg/dL (ref 0–40)

## 2021-07-26 LAB — HEMOGLOBIN A1C
Hgb A1c MFr Bld: 5.2 % (ref 4.8–5.6)
Mean Plasma Glucose: 102.54 mg/dL

## 2021-07-26 LAB — RESP PANEL BY RT-PCR (FLU A&B, COVID) ARPGX2
Influenza A by PCR: NEGATIVE
Influenza B by PCR: NEGATIVE
SARS Coronavirus 2 by RT PCR: NEGATIVE

## 2021-07-26 LAB — POC SARS CORONAVIRUS 2 AG -  ED: SARS Coronavirus 2 Ag: NEGATIVE

## 2021-07-26 LAB — MAGNESIUM: Magnesium: 2 mg/dL (ref 1.7–2.4)

## 2021-07-26 MED ORDER — ARIPIPRAZOLE 15 MG PO TABS
15.0000 mg | ORAL_TABLET | Freq: Every day | ORAL | Status: DC
  Administered 2021-07-26: 15 mg via ORAL
  Filled 2021-07-26: qty 1

## 2021-07-26 MED ORDER — MAGNESIUM HYDROXIDE 400 MG/5ML PO SUSP: 30.0000 mL | Freq: Every day | ORAL | Status: DC | PRN

## 2021-07-26 MED ORDER — ACETAMINOPHEN 325 MG PO TABS: 650.0000 mg | ORAL_TABLET | Freq: Four times a day (QID) | ORAL | Status: DC | PRN

## 2021-07-26 MED ORDER — HYDROXYZINE HCL 25 MG PO TABS
25.0000 mg | ORAL_TABLET | Freq: Three times a day (TID) | ORAL | Status: DC | PRN
  Administered 2021-07-26: 25 mg via ORAL
  Filled 2021-07-26: qty 1

## 2021-07-26 MED ORDER — FLUOXETINE HCL 20 MG PO CAPS: 20.0000 mg | ORAL_CAPSULE | Freq: Every day | ORAL | Status: DC

## 2021-07-26 MED ORDER — ALUM & MAG HYDROXIDE-SIMETH 200-200-20 MG/5ML PO SUSP: 30.0000 mL | ORAL | Status: DC | PRN

## 2021-07-26 MED ORDER — TRAZODONE HCL 100 MG PO TABS
100.0000 mg | ORAL_TABLET | Freq: Every day | ORAL | Status: DC
  Administered 2021-07-26: 100 mg via ORAL
  Filled 2021-07-26: qty 1

## 2021-07-26 NOTE — ED Provider Notes (Signed)
Behavioral Health Admission H&P [facility] & OBS)  Date: 07/26/21 Patient Name: Amanda [name] MRN: [id number] Chief Complaint:  Chief Complaint  Patient presents with   Suicidal      Diagnoses:  Final diagnoses:  Schizoaffective disorder, depressive type [facility])  MDD (major depressive disorder), recurrent severe, without psychosis ([place])    HPI: Patient presents voluntarily to [facility] behavioral health for walk-in assessment.  Patient is accompanied by outpatient counselor, who recommended evaluation.   Patient is assessed face-to-face by nurse practitioner.  Amanda [name] is seated in assessment area, no acute distress.  Patient is alert and oriented, pleasant and cooperative during assessment.   Amanda [name] has been diagnosed with major depressive disorder and schizoaffective disorder. Patient is followed by outpatient psychiatry at [facility]. Reports compliance with medications including Abilify 15mg  daily, hydroxyzine 25mg  three times daily as needed, trazodone 100mg  QHS and prozac 20 mg daily. Amanda [name] is also engaged with outpatient therapy and is able to articulate multiple coping skills. Patient enjoys playing with family dog, talking with friends and music.   Amanda [name] shares that recent stressors include "my father told me that I am screwing up my kid's life." Amanda [name] shares father has been a trigger for many years. Amanda [name] reports son was removed by DSS approx 3 years ago, Amanda [name] has been working on reunification and feels that father's statement "set me back."  Amanda [name] presents with depressed mood, congruent affect. Patient endorses passive  suicidal ideations for approx. one week.  Amanda [name] endorses history of suicide attempts, states "more than 100 attempts."  Amanda [name] denies plan or intent to harm self, states "I am trying not to do that because I want my son back." Amanda [name] endorses history of non-suicidal self-harm behavior by cutting beginning at age 40 years old. Patient endorses recent  cutting, reveals multiple, self-inflicted scratches to bilateral, anterior lower extremities.  Patient denies pain to scratches, currently open to air. No warmth, redness or other signs/symptoms of infection noted.   Patient is unable to contract verbally for safety with this Probation officer currently. Amanda [name] endorses auditory and visual hallucination x one week. States "I hear voices telling me to cut myself and I see shadow people with knives and green eyes." Patient reports similar hallucinations in the past. States "this has happened many times before."  Amanda [name] denies homicidal ideations.  Patient has normal speech and behavior.    Patient is able to converse coherently with goal-directed thoughts and no distractibility or preoccupation.  Amanda [name] denies paranoia.  Objectively there is no evidence of psychosis/mania or delusional thinking.  Patient endorses average sleep and appetite. Amanda [name] resides in [place], denies access to weapons. Patient is not currently employed. Patient denies alcohol and substance use.   Patient offered support and encouragement.    PHQ 2-9:  Flowsheet Row Clinical Support from 07/10/2021 in [facility] Counselor from 06/27/2021 in [place] from 04/06/2021 in [facility]  Thoughts that you would be better off dead, or of hurting yourself in some way Not at all Not at all Not at all  PHQ-9 Total Score 1 2 3        Flowsheet Row ED from 03/10/2021 in [facility]PT Clinical Support from 01/04/2021 in [facility] Admission (Discharged) from 11/30/2020 in [facility]  C-SSRS RISK CATEGORY No Risk Error: Question 6 not populated High Risk        Total Time  spent with patient: 30 minutes  Musculoskeletal  Strength & Muscle Tone: within normal limits Gait & Station: normal Patient  leans: N/A  Psychiatric Specialty Exam  Presentation General Appearance: Appropriate for Environment; Casual  Eye Contact:Good  Speech:Clear and Coherent; Normal Rate  Speech Volume:Normal  Handedness:Right   Mood and Affect  Mood:Depressed  Affect:Depressed   Thought Process  Thought Processes:Coherent; Goal Directed; Linear  Descriptions of Associations:Intact  Orientation:Full (Time, Place and Person)  Thought Content:Logical; WDL    Hallucinations:Hallucinations: Auditory; Visual Description of Auditory Hallucinations: "I hear voices saying that I should cut myself" Description of Visual Hallucinations: "I see shadow people with green eyes and knives"  Ideas of Reference:None  Suicidal Thoughts:Suicidal Thoughts: Yes, Passive SI Passive Intent and/or Plan: Without Intent; Without Plan  Homicidal Thoughts:Homicidal Thoughts: No   Sensorium  Memory:Immediate Good; Recent Good; Remote Good  Judgment:Fair  Insight:Fair   Executive Functions  Concentration:Good  Attention Span:Good  Recall:Good  Fund of Knowledge:Good  Language:Good   Psychomotor Activity  Psychomotor Activity:Psychomotor Activity: Normal   Assets  Assets:Communication Skills; Desire for Improvement; Housing; Intimacy; Leisure Time; Physical Health; Resilience   Sleep  Sleep:Sleep: Fair   Nutritional Assessment (For OBS and [facility]BC admissions only) Has the patient had a weight loss or gain of 10 pounds or more in the last 3 months?: No Has the patient had a decrease in food intake/or appetite?: No Does the patient have dental problems?: No Does the patient have eating habits or behaviors that may be indicators of an eating disorder including binging or inducing vomiting?: No Has the patient recently lost weight without trying?: 0 Has the patient been eating poorly because of a decreased appetite?: 0 Malnutrition Screening Tool Score: 0   Physical Exam Vitals and nursing  note reviewed.  Constitutional:      Appearance: Normal appearance. He is well-developed.  HENT:     Head: Normocephalic and atraumatic.     Nose: Nose normal.  Cardiovascular:     Rate and Rhythm: Normal rate.  Pulmonary:     Effort: Pulmonary effort is normal.  Musculoskeletal:        General: Normal range of motion.     Cervical back: Normal range of motion.  Skin:    General: Skin is warm and dry.     Comments: Multiple, superficial scratches noted to anterior,  bilateral lower extremities  Neurological:     Mental Status: He is alert and oriented to person, place, and time.  Psychiatric:        Attention and Perception: Attention normal. He perceives auditory hallucinations.        Mood and Affect: Affect normal. Mood is depressed.        Speech: Speech normal.        Behavior: Behavior normal. Behavior is cooperative.        Thought Content: Thought content includes suicidal ideation.        Cognition and Memory: Cognition and memory normal.        Judgment: Judgment normal.   Review of Systems  Constitutional: Negative.   HENT: Negative.    Eyes: Negative.   Respiratory: Negative.    Cardiovascular: Negative.   Gastrointestinal: Negative.   Genitourinary: Negative.   Musculoskeletal: Negative.   Skin: Negative.   Neurological: Negative.   Endo/Heme/Allergies: Negative.   Psychiatric/Behavioral:  Positive for depression, hallucinations and suicidal ideas.    Blood pressure 129/72, pulse 64, temperature 98.3 F (36.8 C), temperature source Oral, resp. rate 16,  height 5\' 6"  (1.676 m), weight 176 lb (79.8 kg), SpO2 100 %. Body mass index is 28.41 kg/m.  Past Psychiatric History: schizoaffective disorder, depressive type, GAD, MDD  Is the patient at risk to self? Yes  Has the patient been a risk to self in the past 6 months? No .    Has the patient been a risk to self within the distant past? Yes   Is the patient a risk to others? No   Has the patient been a risk  to others in the past 6 months? No   Has the patient been a risk to others within the distant past? No   Past Medical History:  Past Medical History:  Diagnosis Date   Anxiety    Asthma    Asthma due to environmental allergies    Asthma due to seasonal allergies    Bipolar 1 disorder (HCC)    Borderline personality disorder ([place])    Brain bleed (HCC)    Chronic post-traumatic stress disorder (PTSD)    complex chronic with psychotic features and self harm behaviors   Complication of anesthesia    Constipation    Dander (animal) allergy    Hearing loss    right ear   Heart murmur    denies seeing a cardiologist   Hip dysplasia    Hypertension    Gestional    Major depression, chronic    Mood disorder (HCC)    OCD (obsessive compulsive disorder)    Pneumonia    PONV (postoperative nausea and vomiting)    PTSD (post-traumatic stress disorder)    RA (rheumatoid arthritis) ([place])    Rheumatoid arthritis ([place])    Schizophrenia ([place])    "I think that is wrong"   Seizure disorder ([place])    Suicidal ideations    Suicide attempt [facility])     Past Surgical History:  Procedure Laterality Date   Brain Shunt  1981   "a few hours old"   EYE SURGERY Bilateral [place] (IUD) INSERTION N/A 09/16/2018   Procedure: INTRAUTERINE DEVICE (IUD) INSERTION;  Surgeon: Lavonia Drafts, MD;  Location: [facility];  Service: Gynecology;  Laterality: N/A;   PERINEUM REPAIR N/A 09/16/2018   Procedure: EPISIOTOMY REVISION;  Surgeon: Lavonia Drafts, MD;  Location: [facility];  Service: Gynecology;  Laterality: N/A;   SHUNT REMOVAL  1983   Tooth Removal     multiple    Family History:  Family History  Problem Relation Age of Onset   Heart attack Mother    Stroke Mother    Liver cancer Mother    Diabetes Mother    Lung cancer Mother    Alcoholism Father    Sleep apnea Brother    Depression Brother    ADD / ADHD Brother    Diabetes Maternal Aunt    Diabetes Paternal  Uncle    Colon cancer Paternal Uncle    Diabetes Maternal Grandmother    Dementia Maternal Grandmother     Social History:  Social History   Socioeconomic History   Marital status: Single    Spouse name: Not on file   Number of children: 1   Years of education: 12   Highest education level: 12th grade  Occupational History   Not on file  Tobacco Use   Smoking status: Every Day    Packs/day: 0.50    Years: 26.00    Pack years: 13.00    Types: Cigarettes   Smokeless tobacco: Never  Vaping Use   Vaping Use: Former  Substance and Sexual Activity   Alcohol use: Not Currently   Drug use: Not Currently    Types: Marijuana    Comment: sober from from drugs    Sexual activity: Not Currently    Birth control/protection: None  Other Topics Concern   Not on file  Social History Narrative   ** Merged History Encounter **       Lives with her fiance, friend, and fiance's brother   Right handed   Social Determinants of Health   Financial Resource Strain: Not on file  Food Insecurity: Not on file  Transportation Needs: Not on file  Physical Activity: Not on file  Stress: Not on file  Social Connections: Not on file  Intimate Partner Violence: Not on file    SDOH:  SDOH Screenings   Alcohol Screen: Low Risk    Last Alcohol Screening Score (AUDIT): 1  Depression (PHQ2-9): Low Risk    PHQ-2 Score: 1  Emergency planning/management officer Strain: Not on file  Food Insecurity: Not on file  Housing: Not on file  Physical Activity: Not on file  Social Connections: Not on file  Stress: Not on file  Tobacco Use: High Risk   Smoking Tobacco Use: Every Day   Smokeless Tobacco Use: Never   Passive Exposure: Not on file  Transportation Needs: Not on file    Last Labs:  No visits with results within 6 Month(s) from this visit.  Latest known visit with results is:  Admission on 11/30/2020, Discharged on 12/05/2020  Component Date Value Ref Range Status   Hgb A1c MFr Bld 12/03/2020 5.5  4.8  - 5.6 % Final   Comment: (NOTE)         Prediabetes: 5.7 - 6.4         Diabetes: >6.4         Glycemic control for adults with diabetes: <7.0    Mean Plasma Glucose 12/03/2020 111  mg/dL Final   Comment: (NOTE) Performed At: [facility] [place], [place] [id number] Rush Farmer MD HC:[id number]    Cholesterol 12/03/2020 180  0 - 200 mg/dL Final   Triglycerides 12/03/2020 147  <150 mg/dL Final   HDL 12/03/2020 43  >40 mg/dL Final   Total CHOL/HDL Ratio 12/03/2020 4.2  RATIO Final   VLDL 12/03/2020 29  0 - 40 mg/dL Final   LDL Cholesterol 12/03/2020 108 (H)  0 - 99 mg/dL Final   Comment:        Total Cholesterol/HDL:CHD Risk Coronary Heart Disease Risk Table                     Men   Women  1/2 Average Risk   3.4   3.3  Average Risk       5.0   4.4  2 X Average Risk   9.6   7.1  3 X Average Risk  23.4   11.0        Use the calculated Patient Ratio above and the CHD Risk Table to determine the patient's CHD Risk.        ATP III CLASSIFICATION (LDL):  <100     mg/dL   Optimal  100-129  mg/dL   Near or Above                    Optimal  130-159  mg/dL   Borderline  160-189  mg/dL   High  >190  mg/dL   Very High Performed at [facility] [address], [place], [place] [address]     Allergies: Aspartame and phenylalanine, Benadryl [diphenhydramine], Other, Scallops [shellfish allergy], Yellow jacket venom [bee venom], Pollen extract, Tegretol [carbamazepine], Adhesive [tape], and Latex  PTA Medications: (Not in a hospital admission)   Medical Decision Making  Patient reviewed with Dr Serafina Mitchell. Inpatient psychiatric treatment recommended, patient remains voluntary at this time.   Laboratory studies ordered including CBC, CMP, ethanol, A1c, hepatic function, lipid panel, magnesium, prolactin and TSH.  Urine pregnancy and urine drug screen order initiated.  EKG ordered.Current medications: -Acetaminophen 650 mg every 6 as  needed/mild pain -Maalox 30 mL oral every 4 as needed/digestion -Aripiprazole 15mg  QHS -Fluoxetine 20mg  daily -Hydroxyzine 25 mg 3 times daily as needed/anxiety -Magnesium hydroxide 30 mL daily as needed/mild constipation -Trazodone 100 mg nightly/sleep     Recommendations  Based on my evaluation the patient does not appear to have an emergency medical condition.  Lucky Rathke, FNP 07/26/21  12:48 PM

## 2021-07-26 NOTE — ED Notes (Signed)
Pt awake and alert x  4.  Apprehensive affect.  r

## 2021-07-26 NOTE — ED Notes (Signed)
Unable to obtain blood after third stick.   Provider notified.

## 2021-07-26 NOTE — BH Assessment (Signed)
SI without a plan for the past several days. Pt reports being triggered by his father and step mother stating that he was messing up his son life. Pt has thoughts of "bashing" in his father head but denies intent. Pt reports AVH and denies substance use.    Pt is urgent

## 2021-07-26 NOTE — Progress Notes (Signed)
Pt was accepted to [facility] 07/26/2021 after 9pm; Bed Assignment 401-2.  Pt meets inpatient criteria per Beatriz Stallion, FNP  Attending Physician will be MD Viann Fish.   Report can be called to: Adult unit: [phone].  Pt can arrive after 9:00pm.  Care Team notified via secure chat: Beatriz Stallion, FNP, [facility] [place] Reader, RN, Boyce Medici, RN, Adrian Prince, RN, Roxy Manns, RN, and Larry Sierras.   Nadara Mode, LCSWA 07/26/2021 @ 7:16 PM

## 2021-07-26 NOTE — ED Notes (Signed)
Pt resting at present, A&O x 4, no distress noted.  Monitoring for safety.
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
## 2021-07-26 NOTE — Progress Notes (Signed)
   THERAPIST PROGRESS NOTE  Session Time: 30  Participation Level: Active  Behavioral Response: CasualAlertAnxious and Depressed  Type of Therapy: Individual Therapy  Treatment Goals addressed: Coping and Diagnosis: schizoaffective disorder  Interventions: CBT and Supportive  Summary: Amanda [name] is a 40 y.o. adult who presents with anxious and depressed mood\affect.  Patient was pleasant, cooperative, and maintained good eye contact.  [name] was alert and oriented x5.  He engaged well in therapy session and was dressed casually.  Primary stressor today is family conflict and legal.  Patient still is attempting to get custody of her son PJ back who he lost custody of over 1 year ago.  Patient has been working towards getting her son back for some time doing things required by Department of Social Services for parenting classes, mental health therapy, medication management, and obtaining proper housing.  Patient reports that she is recently got housing which is the last step in her process other than getting furniture to fill the house.     Other stressors for patient are her father.  She reports that her father.  Patient states that he keeps on calling asking about PJ but never asked about patient.  Father provides unsolicited parenting advice to patient and advises patient that "his priorities are wrong".  LCSW asked patient to provide an example of his priorities.  Patient reports wanting to get a kitchen chair and table for his son to play at and patient's father thinks that he should get a bed.  [name] states that he has tried to block his number multiple times but the family convinces him to unblock it because if patient does not answer he will turn to drinking.  [name] states that he has tried to ignore his calls but he will call repeatedly until patient answers.   Patient reports that her stress has been steadily increasing and she reports "nightmares and only getting 1 hour of sleep per  night".  Patient reports that this has been happening for the past week and states that she is starting to have auditory and visual hallucinations for voices stating to "cut myself" and patient reports seeing "shadow people".  LCSW provided a chart review for patient and this is happened 1 time before and patient was admitted to [facility].  Patient reports active harm to self and patient was voluntarily walked down to the behavioral health urgent care for further evaluation.    Suicidal/Homicidal: Nowithout intent/plan  Therapist Response:    Intervention/Plan: Plan is for patient to be evaluated by behavioral health urgent care for further evaluations due to harm to self.  LCSW utilized interventions in today's session such as supportive therapy, person centered therapy, and motivational interviewing.  LCSW utilized language for praise, encouragement, and empowerment.  LCSW went over mandated reporting and consequences of self-harm.    Plan: Return again in 4  weeks.     [name], LCSW 07/26/2021

## 2021-07-26 NOTE — ED Notes (Signed)
Patient arrived on unit. Patient given meal. Patient cooperative and pleasant.

## 2021-07-26 NOTE — ED Notes (Signed)
Pt sleeping@this time. Breathing even and unlabored. Will continue to monitor for safety 

## 2021-07-26 NOTE — ED Notes (Signed)
Two nurses have attempted to draw blood without success.  Will attempt again after patient continues to drink fluid. -
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
## 2021-07-27 ENCOUNTER — Inpatient Hospital Stay (HOSPITAL_COMMUNITY)
Admission: AD | Admit: 2021-07-27 | Discharge: 2021-08-01 | DRG: 885 | Disposition: A | Discharge status: Home / Self Care | Payer: Medicare (Managed Care) | Source: Intra-hospital | Attending: Emergency Medicine | Admitting: Emergency Medicine

## 2021-07-27 ENCOUNTER — Encounter (HOSPITAL_COMMUNITY): Payer: Self-pay | Admitting: Family

## 2021-07-27 DIAGNOSIS — G40909 Epilepsy, unspecified, not intractable, without status epilepticus: Secondary | ICD-10-CM | POA: Diagnosis present

## 2021-07-27 DIAGNOSIS — Z818 Family history of other mental and behavioral disorders: Secondary | ICD-10-CM | POA: Diagnosis not present

## 2021-07-27 DIAGNOSIS — Z79899 Other long term (current) drug therapy: Secondary | ICD-10-CM

## 2021-07-27 DIAGNOSIS — F4310 Post-traumatic stress disorder, unspecified: Secondary | ICD-10-CM | POA: Diagnosis not present

## 2021-07-27 DIAGNOSIS — Z91018 Allergy to other foods: Secondary | ICD-10-CM

## 2021-07-27 DIAGNOSIS — Z888 Allergy status to other drugs, medicaments and biological substances status: Secondary | ICD-10-CM

## 2021-07-27 DIAGNOSIS — F259 Schizoaffective disorder, unspecified: Secondary | ICD-10-CM | POA: Diagnosis present

## 2021-07-27 DIAGNOSIS — F4312 Post-traumatic stress disorder, chronic: Secondary | ICD-10-CM | POA: Diagnosis present

## 2021-07-27 DIAGNOSIS — F250 Schizoaffective disorder, bipolar type: Principal | ICD-10-CM

## 2021-07-27 DIAGNOSIS — F429 Obsessive-compulsive disorder, unspecified: Secondary | ICD-10-CM | POA: Diagnosis present

## 2021-07-27 DIAGNOSIS — J45909 Unspecified asthma, uncomplicated: Secondary | ICD-10-CM | POA: Diagnosis present

## 2021-07-27 DIAGNOSIS — Z91040 Latex allergy status: Secondary | ICD-10-CM | POA: Diagnosis not present

## 2021-07-27 DIAGNOSIS — F603 Borderline personality disorder: Secondary | ICD-10-CM | POA: Diagnosis present

## 2021-07-27 DIAGNOSIS — R45851 Suicidal ideations: Secondary | ICD-10-CM | POA: Diagnosis present

## 2021-07-27 DIAGNOSIS — M069 Rheumatoid arthritis, unspecified: Secondary | ICD-10-CM | POA: Diagnosis present

## 2021-07-27 DIAGNOSIS — F410 Panic disorder [episodic paroxysmal anxiety] without agoraphobia: Secondary | ICD-10-CM | POA: Diagnosis present

## 2021-07-27 DIAGNOSIS — F17210 Nicotine dependence, cigarettes, uncomplicated: Secondary | ICD-10-CM | POA: Diagnosis present

## 2021-07-27 DIAGNOSIS — Z91013 Allergy to seafood: Secondary | ICD-10-CM | POA: Diagnosis not present

## 2021-07-27 DIAGNOSIS — Z91030 Bee allergy status: Secondary | ICD-10-CM | POA: Diagnosis not present

## 2021-07-27 DIAGNOSIS — F251 Schizoaffective disorder, depressive type: Secondary | ICD-10-CM | POA: Diagnosis not present

## 2021-07-27 LAB — TSH: TSH: 2.22 u[IU]/mL (ref 0.350–4.500)

## 2021-07-27 MED ORDER — HYDROXYZINE HCL 25 MG PO TABS
25.0000 mg | ORAL_TABLET | Freq: Three times a day (TID) | ORAL | Status: DC | PRN
  Administered 2021-07-27 – 2021-07-31 (×5): 25 mg via ORAL
  Filled 2021-07-27 (×5): qty 1

## 2021-07-27 MED ORDER — ARIPIPRAZOLE 15 MG PO TABS
15.0000 mg | ORAL_TABLET | Freq: Every day | ORAL | Status: DC
  Administered 2021-07-27: 15 mg via ORAL
  Filled 2021-07-27 (×3): qty 1

## 2021-07-27 MED ORDER — TRAZODONE HCL 100 MG PO TABS
100.0000 mg | ORAL_TABLET | Freq: Every day | ORAL | Status: DC
  Administered 2021-07-27: 100 mg via ORAL
  Filled 2021-07-27 (×3): qty 1

## 2021-07-27 MED ORDER — ALUM & MAG HYDROXIDE-SIMETH 200-200-20 MG/5ML PO SUSP: 30.0000 mL | ORAL | Status: DC | PRN

## 2021-07-27 MED ORDER — ARIPIPRAZOLE 10 MG PO TABS
20.0000 mg | ORAL_TABLET | Freq: Every day | ORAL | Status: DC
  Administered 2021-07-28 – 2021-08-01 (×5): 20 mg via ORAL
  Filled 2021-07-27 (×8): qty 2

## 2021-07-27 MED ORDER — MAGNESIUM HYDROXIDE 400 MG/5ML PO SUSP: 30.0000 mL | Freq: Every day | ORAL | Status: DC | PRN

## 2021-07-27 MED ORDER — NICOTINE 14 MG/24HR TD PT24
14.0000 mg | MEDICATED_PATCH | Freq: Every day | TRANSDERMAL | Status: DC
  Administered 2021-07-27 – 2021-08-01 (×6): 14 mg via TRANSDERMAL
  Filled 2021-07-27 (×8): qty 1

## 2021-07-27 MED ORDER — ACETAMINOPHEN 325 MG PO TABS
650.0000 mg | ORAL_TABLET | Freq: Four times a day (QID) | ORAL | Status: DC | PRN
  Administered 2021-07-27: 650 mg via ORAL
  Filled 2021-07-27: qty 2

## 2021-07-27 MED ORDER — FLUOXETINE HCL 20 MG PO CAPS
20.0000 mg | ORAL_CAPSULE | Freq: Every day | ORAL | Status: DC
  Administered 2021-07-27 – 2021-08-01 (×6): 20 mg via ORAL
  Filled 2021-07-27 (×8): qty 1

## 2021-07-27 NOTE — Progress Notes (Signed)
[facility] Group Notes:  (Nursing/MHT/Case Management/Adjunct)  Date:  07/27/2021  Time:  2020  Type of Therapy:   wrap up group  Participation Level:  Active  Participation Quality:  Appropriate, Attentive, Sharing, and Supportive  Affect:  Depressed  Cognitive:  Alert  Insight:  Improving  Engagement in Group:  Engaged  Modes of Intervention:  Clarification, Education, and Support  Summary of Progress/Problems: Positive thinking and positive change were discussed.   Winfield Rast S 07/27/2021, 9:09 PM

## 2021-07-27 NOTE — ED Notes (Signed)
Pt sleeping@this time. Breathing even and unlabored. Will continue to monitor for safety 

## 2021-07-27 NOTE — ED Notes (Signed)
Report called to RN Heather/[facility].  Pending SafeTransport.  RM [phone]

## 2021-07-27 NOTE — ED Provider Notes (Signed)
FBC/OBS ASAP Discharge Summary  Date and Time: 07/27/2021 2:54 AM  Name: Amanda [name]  MRN:  [id number]   Discharge Diagnoses:  Final diagnoses:  Schizoaffective disorder, depressive type [facility])  MDD (major depressive disorder), recurrent severe, without psychosis ([place])    Subjective: Per Amanda Stallion, FNP 07/26/21 [place] Admission H&P HPI:   "Patient presents voluntarily to [facility] behavioral health for walk-in assessment.  Patient is accompanied by outpatient counselor, who recommended evaluation.    Patient is assessed face-to-face by nurse practitioner.  Amanda [name] is seated in assessment area, no acute distress.  Patient is alert and oriented, pleasant and cooperative during assessment.    Amanda [name] has been diagnosed with major depressive disorder and schizoaffective disorder. Patient is followed by outpatient psychiatry at [facility]. Reports compliance with medications including Abilify 15mg  daily, hydroxyzine 25mg  three times daily as needed, trazodone 100mg  QHS and prozac 20 mg daily. Amanda [name] is also engaged with outpatient therapy and is able to articulate multiple coping skills. Patient enjoys playing with family dog, talking with friends and music.    Amanda [name] shares that recent stressors include "my father told me that I am screwing up my kid's life." Amanda [name] shares father has been a trigger for many years. Amanda [name] reports son was removed by DSS approx 3 years ago, Amanda [name] has been working on reunification and feels that father's statement "set me back."   Amanda [name] presents with depressed mood, congruent affect. Patient endorses passive  suicidal ideations for approx. one week.  Amanda [name] endorses history of suicide attempts, states "more than 100 attempts."  Amanda [name] denies plan or intent to harm self, states "I am trying not to do that because I want my son back." Amanda [name] endorses history of non-suicidal self-harm behavior by cutting beginning at age 40 years old. Patient endorses recent  cutting, reveals multiple, self-inflicted scratches to bilateral, anterior lower extremities.  Patient denies pain to scratches, currently open to air. No warmth, redness or other signs/symptoms of infection noted.    Patient is unable to contract verbally for safety with this Probation officer currently. Amanda [name] endorses auditory and visual hallucination x one week. States "I hear voices telling me to cut myself and I see shadow people with knives and green eyes." Patient reports similar hallucinations in the past. States "this has happened many times before."   Amanda [name] denies homicidal ideations.  Patient has normal speech and behavior.    Patient is able to converse coherently with goal-directed thoughts and no distractibility or preoccupation.  Amanda [name] denies paranoia.  Objectively there is no evidence of psychosis/mania or delusional thinking.   Patient endorses average sleep and appetite. Amanda [name] resides in [place], denies access to weapons. Patient is not currently employed. Patient denies alcohol and substance use.    Patient offered support and encouragement."   Stay Summary: Patient presented to the [facility] behavioral health urgent care [facility]) voluntarily accompanied by his outpatient counselor for depressive symptoms and suicidal ideation.  Patient was evaluated by TTS counselor and  [facility] provider and inpatient psychiatric treatment was recommended for the patient at time of that evaluation.  Patient was conditionally accepted to [facility]) for inpatient psychiatric treatment pending negative COVID PCR test and lab work results.  PCR COVID test was done along with other admission labs including blood work and urine and patient was admitted to behavior health urgent care continuous assessment unit for further stabilization and treatment while waiting for PCR COVID/lab results for inpatient psychiatric admission.  Labs  have resulted with no apparent acute/emergent abnormalities  noted, PCR COVID test is negative.  Thus, patient is accepted to [facility] for inpatient psychiatric treatment.  Will transfer the patient to [facility] to begin inpatient psychiatric treatment.  Total Time spent with patient: 30 minutes  Past Psychiatric History: schizoaffective disorder, depressive type, GAD, MDD   Past Medical History:  Past Medical History:  Diagnosis Date   Anxiety    Asthma    Asthma due to environmental allergies    Asthma due to seasonal allergies    Bipolar 1 disorder ([place])    Borderline personality disorder ([place])    Brain bleed (HCC)    Chronic post-traumatic stress disorder (PTSD)    complex chronic with psychotic features and self harm behaviors   Complication of anesthesia    Constipation    Dander (animal) allergy    Hearing loss    right ear   Heart murmur    denies seeing a cardiologist   Hip dysplasia    Hypertension    Gestional    Major depression, chronic    Mood disorder (HCC)    OCD (obsessive compulsive disorder)    Pneumonia    PONV (postoperative nausea and vomiting)    PTSD (post-traumatic stress disorder)    RA (rheumatoid arthritis) ([place])    Rheumatoid arthritis ([place])    Schizophrenia ([place])    "I think that is wrong"   Seizure disorder ([place])    Suicidal ideations    Suicide attempt [facility])     Past Surgical History:  Procedure Laterality Date   Brain Shunt  1981   "a few hours old"   EYE SURGERY Bilateral [place] (IUD) INSERTION N/A 09/16/2018   Procedure: INTRAUTERINE DEVICE (IUD) INSERTION;  Surgeon: Lavonia Drafts, MD;  Location: [place];  Service: Gynecology;  Laterality: N/A;   PERINEUM REPAIR N/A 09/16/2018   Procedure: EPISIOTOMY REVISION;  Surgeon: Lavonia Drafts, MD;  Location: [place] [facility];  Service: Gynecology;  Laterality: N/A;   SHUNT REMOVAL  1983   Tooth Removal     multiple   Family History:  Family History  Problem Relation Age of Onset   Heart attack Mother    Stroke Mother    Liver  cancer Mother    Diabetes Mother    Lung cancer Mother    Alcoholism Father    Sleep apnea Brother    Depression Brother    ADD / ADHD Brother    Diabetes Maternal Aunt    Diabetes Paternal Uncle    Colon cancer Paternal Uncle    Diabetes Maternal Grandmother    Dementia Maternal Grandmother    Family Psychiatric History: None reported.  Social History:  Social History   Substance and Sexual Activity  Alcohol Use Not Currently     Social History   Substance and Sexual Activity  Drug Use Not Currently   Types: Marijuana   Comment: sober from from drugs     Social History   Socioeconomic History   Marital status: Single    Spouse name: Not on file   Number of children: 1   Years of education: 12   Highest education level: 12th grade  Occupational History   Not on file  Tobacco Use   Smoking status: Every Day    Packs/day: 0.50    Years: 26.00    Pack years: 13.00    Types: Cigarettes   Smokeless tobacco: Never  Vaping Use   Vaping Use: Former  Substance and Sexual Activity   Alcohol use: Not Currently   Drug use: Not Currently    Types: Marijuana    Comment: sober from from drugs    Sexual activity: Not Currently    Birth control/protection: None  Other Topics Concern   Not on file  Social History Narrative   ** Merged History Encounter **       Lives with her fiance, friend, and fiance's brother   Right handed   Social Determinants of Health   Financial Resource Strain: Not on file  Food Insecurity: Not on file  Transportation Needs: Not on file  Physical Activity: Not on file  Stress: Not on file  Social Connections: Not on file   SDOH:  SDOH Screenings   Alcohol Screen: Low Risk    Last Alcohol Screening Score (AUDIT): 1  Depression (PHQ2-9): Low Risk    PHQ-2 Score: 1  Financial Resource Strain: Not on file  Food Insecurity: Not on file  Housing: Not on file  Physical Activity: Not on file  Social Connections: Not on file  Stress:  Not on file  Tobacco Use: High Risk   Smoking Tobacco Use: Every Day   Smokeless Tobacco Use: Never   Passive Exposure: Not on file  Transportation Needs: Not on file    Tobacco Cessation:  Prescription not provided because: Patient is being transferred to a facility where tobacco cessation products can be provided for the patient if necessary.  Current Medications:  Current Facility-Administered Medications  Medication Dose Route Frequency Provider Last Rate Last Admin   acetaminophen (TYLENOL) tablet 650 mg  650 mg Oral Q6H PRN Lucky Rathke, FNP       alum & mag hydroxide-simeth (MAALOX/MYLANTA) 200-200-20 MG/5ML suspension 30 mL  30 mL Oral Q4H PRN Lucky Rathke, FNP       ARIPiprazole (ABILIFY) tablet 15 mg  15 mg Oral QHS Lucky Rathke, FNP   15 mg at 07/26/21 2126   FLUoxetine (PROZAC) capsule 20 mg  20 mg Oral Daily Lucky Rathke, FNP       hydrOXYzine (ATARAX) tablet 25 mg  25 mg Oral TID PRN Lucky Rathke, FNP   25 mg at 07/26/21 2126   magnesium hydroxide (MILK OF MAGNESIA) suspension 30 mL  30 mL Oral Daily PRN Lucky Rathke, FNP       traZODone (DESYREL) tablet 100 mg  100 mg Oral QHS Lucky Rathke, FNP   100 mg at 07/26/21 2126   Current Outpatient Medications  Medication Sig Dispense Refill   ARIPiprazole (ABILIFY) 15 MG tablet Take 1 tablet (15 mg total) by mouth daily. 30 tablet 3   Ferrous Sulfate (IRON) 325 (65 Fe) MG TABS Take 325 mg by mouth daily.     FLUoxetine (PROZAC) 20 MG capsule Take 1 capsule (20 mg total) by mouth daily. 30 capsule 3   hydrOXYzine (ATARAX/VISTARIL) 25 MG tablet Take 1 tablet (25 mg total) by mouth 3 (three) times daily as needed for anxiety. 90 tablet 3   Multiple Vitamin (MULTIVITAMIN WITH MINERALS) TABS tablet Take 1 tablet by mouth daily.     traZODone (DESYREL) 100 MG tablet Take 1 tablet (100 mg total) by mouth at bedtime. 30 tablet 3    PTA Medications: (Not in a hospital admission)   Musculoskeletal  Strength & Muscle Tone:  within normal limits Gait & Station: normal Patient leans: N/A  Psychiatric Specialty Exam  Per Amanda Stallion, FNP 07/26/21 PSE:  Presentation  General Appearance: Appropriate for Environment; Casual  Eye Contact:Good  Speech:Clear and Coherent; Normal Rate  Speech Volume:Normal  Handedness:Right   Mood and Affect  Mood:Depressed  Affect:Depressed   Thought Process  Thought Processes:Coherent; Goal Directed; Linear  Descriptions of Associations:Intact  Orientation:Full (Time, Place and Person)  Thought Content:Logical; WDL   Duration of Psychotic Symptoms: Greater than six months   Hallucinations:Hallucinations: Auditory; Visual Description of Auditory Hallucinations: "I hear voices saying that I should cut myself" Description of Visual Hallucinations: "I see shadow people with green eyes and knives"  Ideas of Reference:None  Suicidal Thoughts:Suicidal Thoughts: Yes, Passive SI Passive Intent and/or Plan: Without Intent; Without Plan  Homicidal Thoughts:Homicidal Thoughts: No   Sensorium  Memory:Immediate Good; Recent Good; Remote Good  Judgment:Fair  Insight:Fair   Executive Functions  Concentration:Good  Attention Span:Good  Recall:Good  Fund of Knowledge:Good  Language:Good   Psychomotor Activity  Psychomotor Activity:Psychomotor Activity: Normal   Assets  Assets:Communication Skills; Desire for Improvement; Housing; Intimacy; Leisure Time; Physical Health; Resilience   Sleep  Sleep:Sleep: Fair   Nutritional Assessment (For OBS and FBC admissions only) Has the patient had a weight loss or gain of 10 pounds or more in the last 3 months?: No Has the patient had a decrease in food intake/or appetite?: No Does the patient have dental problems?: No Does the patient have eating habits or behaviors that may be indicators of an eating disorder including binging or inducing vomiting?: No Has the patient recently lost weight without trying?:  0 Has the patient been eating poorly because of a decreased appetite?: 0 Malnutrition Screening Tool Score: 0    Physical Exam  Physical Exam Vitals reviewed.  Constitutional:      General: He is not in acute distress.    Appearance: He is not ill-appearing, toxic-appearing or diaphoretic.  HENT:     Head: Normocephalic and atraumatic.     Right Ear: External ear normal.     Left Ear: External ear normal.     Nose: Nose normal.  Eyes:     General:        Right eye: No discharge.        Left eye: No discharge.     Conjunctiva/sclera: Conjunctivae normal.  Cardiovascular:     Rate and Rhythm: Normal rate.  Pulmonary:     Effort: Pulmonary effort is normal. No respiratory distress.  Musculoskeletal:        General: Normal range of motion.     Cervical back: Normal range of motion.  Neurological:     Mental Status: He is alert and oriented to person, place, and time.   Review of Systems  Constitutional:  Negative for chills, diaphoresis, fever, malaise/fatigue and weight loss.  HENT:  Negative for congestion.   Respiratory:  Negative for cough and shortness of breath.   Cardiovascular:  Negative for chest pain and palpitations.  Gastrointestinal:  Negative for abdominal pain, constipation, diarrhea, nausea and vomiting.  Musculoskeletal:  Negative for joint pain and myalgias.  Neurological:  Negative for dizziness and headaches.  Psychiatric/Behavioral:  Positive for depression, hallucinations and suicidal ideas.   All other systems reviewed and are negative.  Vitals: Blood pressure 130/86, pulse 65, temperature 98.6 F (37 C), temperature source Oral, resp. rate 18, height 5\' 6"  (1.676 m), weight 176 lb (79.8 kg), SpO2 98 %. Body mass index is 28.41 kg/m.    Disposition: Will transfer the patient to [facility]. EMTALA form completed.  Prescilla Sours, PA-C 07/27/2021, 2:54 AM

## 2021-07-27 NOTE — Progress Notes (Signed)
Progress note  Pt found in bed; allowed to rest with their late admission. Pt was grateful for this later on approach. Pt still expressed sadness, anxiety, depression, and avh, seeing shadows with green eyes that had command hallucinations to kill themselves. Pt agrees to approach staff before harming self while at [place] has been resting much of the day. Pt is pleasant. Pt denies hi and verbally agrees to approach staff before harming others while at [place]: Pt provided support and encouragement. Pt given medication per protocol and standing orders. Q48m safety checks implemented and continued.  R: Pt safe on the unit. Will continue to monitor.

## 2021-07-27 NOTE — Discharge Instructions (Addendum)
Patient to be transferred to [facility] for inpatient psychiatric treatment.

## 2021-07-27 NOTE — Plan of Care (Signed)
  Problem: Education: Goal: Knowledge of [facility] Education information/materials will improve Outcome: Progressing Goal: Emotional status will improve Outcome: Progressing Goal: Mental status will improve Outcome: Progressing   

## 2021-07-27 NOTE — Progress Notes (Signed)
Amanda [name] is a 40 year old transgendered female with preferred name Amanda [name] and gender pronouns he/him/his is accompanied to [facility] by his therapist Quita Skye) during their scheduled appointment today.  He presented with suicidal ideations and increasing AVH for the past week.  He has been seeing shadow people, holding a knife and telling him to cut himself.  He has been cutting himself with a kitchen knife for the past two days.  He reports a history of AVH since the fifth grade. He reports stressors related to conflict with his father and stepmother telling him that he is screwing up his child life. He has history of Schizoaffective disorder, depressive type and Major depressive disorder.  During [facility] admission, he was pleasant and cooperative.  She continues to voice passive SI and AVH.  No HI.  He does agree to seek out staff if those thoughts worsen.  He was given Trazodone, hydroxyzine and Abilify at 2126.  He reported feeling tired and wanted to go lay down.  Oriented him to the unit.  Admission paperwork completed and signed.  Belongings searched and secured in locker # 48.  Skin assessment completed and noted superficial cuts to both lower legs.  No contraband found.  Suicide safety plan reviewed, given to patient to complete and return to his nurse.  Q 15 minute checks initiated for safety.  We will continue to monitor the progress towards his goals.

## 2021-07-27 NOTE — ED Notes (Signed)
Safe Transport to [facility].

## 2021-07-27 NOTE — [facility] Group Notes (Signed)
Patient did not attend the relaxation group. 

## 2021-07-27 NOTE — H&P (Addendum)
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  Long Term Goal(s): Improvement in symptoms so as ready for discharge  Short Term Goals: Ability to identify changes in lifestyle to reduce recurrence of condition will improve, Ability to verbalize feelings will improve, Ability to disclose and discuss suicidal ideas, Ability to demonstrate self-control will improve, Ability to identify and develop effective coping behaviors will improve, Ability to maintain clinical measurements within normal limits will improve, and Ability to identify triggers associated with substance abuse/mental health issues will improve  I certify that inpatient services furnished can  reasonably be expected to improve the patient's condition.     PGY-2 Christoper Allegra, MD 12/9/202212:13 PM  Total Time Spent in Direct Patient Care:  I personally spent 60 minutes on the unit in direct patient care. The direct patient care time included face-to-face time with the patient, reviewing the patient's chart, communicating with other professionals, and coordinating care. Greater than 50% of this time was spent in counseling or coordinating care with the patient regarding goals of hospitalization, psycho-education, and discharge planning needs.  I have independently evaluated the patient during a face-to-face assessment on 07/27/21. I reviewed the patient's chart, and I participated in key portions of the service. I discussed the case with the [employer], and I agree with the assessment and plan of care as documented in the House Officer's note, as addended by me or notated below:  I directly  edited the note, as above.   Janine Limbo, MD Psychiatrist
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
## 2021-07-28 ENCOUNTER — Encounter (HOSPITAL_COMMUNITY): Payer: Self-pay

## 2021-07-28 DIAGNOSIS — F250 Schizoaffective disorder, bipolar type: Secondary | ICD-10-CM

## 2021-07-28 LAB — PROLACTIN: Prolactin: 39.6 ng/mL — ABNORMAL HIGH (ref 4.8–23.3)

## 2021-07-28 LAB — URINALYSIS, COMPLETE (UACMP) WITH MICROSCOPIC
Bilirubin Urine: NEGATIVE
Glucose, UA: NEGATIVE mg/dL
Ketones, ur: NEGATIVE mg/dL
Nitrite: NEGATIVE
Protein, ur: NEGATIVE mg/dL
Specific Gravity, Urine: 1.025 (ref 1.005–1.030)
pH: 5.5 (ref 5.0–8.0)

## 2021-07-28 LAB — CBC
HCT: 48.4 % — ABNORMAL HIGH (ref 36.0–46.0)
Hemoglobin: 15.8 g/dL — ABNORMAL HIGH (ref 12.0–15.0)
MCH: 31.2 pg (ref 26.0–34.0)
MCHC: 32.6 g/dL (ref 30.0–36.0)
MCV: 95.7 fL (ref 80.0–100.0)
Platelets: 285 10*3/uL (ref 150–400)
RBC: 5.06 MIL/uL (ref 3.87–5.11)
RDW: 13.1 % (ref 11.5–15.5)
WBC: 14.5 10*3/uL — ABNORMAL HIGH (ref 4.0–10.5)
nRBC: 0 % (ref 0.0–0.2)

## 2021-07-28 LAB — COMPREHENSIVE METABOLIC PANEL
ALT: 10 U/L (ref 0–44)
AST: 14 U/L — ABNORMAL LOW (ref 15–41)
Albumin: 4.3 g/dL (ref 3.5–5.0)
Alkaline Phosphatase: 73 U/L (ref 38–126)
Anion gap: 9 (ref 5–15)
BUN: 17 mg/dL (ref 6–20)
CO2: 24 mmol/L (ref 22–32)
Calcium: 9.5 mg/dL (ref 8.9–10.3)
Chloride: 104 mmol/L (ref 98–111)
Creatinine, Ser: 0.68 mg/dL (ref 0.44–1.00)
GFR, Estimated: >60 mL/min (ref >60)
Glucose, Bld: 117 mg/dL — ABNORMAL HIGH (ref 70–99)
Potassium: 3.7 mmol/L (ref 3.5–5.1)
Sodium: 137 mmol/L (ref 135–145)
Total Bilirubin: 0.5 mg/dL (ref 0.3–1.2)
Total Protein: 7.5 g/dL (ref 6.5–8.1)

## 2021-07-28 MED ORDER — TRAZODONE HCL 150 MG PO TABS
150.0000 mg | ORAL_TABLET | Freq: Every day | ORAL | Status: DC
  Administered 2021-07-28 – 2021-07-31 (×4): 150 mg via ORAL
  Filled 2021-07-28 (×7): qty 1

## 2021-07-28 NOTE — Plan of Care (Signed)
  Problem: Education: Goal: Verbalization of understanding the information provided will improve Outcome: Progressing   Problem: Activity: Goal: Interest or engagement in activities will improve Outcome: Progressing Goal: Sleeping patterns will improve Outcome: Progressing   
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
## 2021-07-28 NOTE — Group Note (Signed)
Recreation Therapy Group Note   Group Topic:Stress Management  Group Date: 07/28/2021 Start Time: 0930 End Time: 0949 Facilitators: Victorino Sparrow, [place] Location: [address] Dayroom   Goal Area(s) Addresses:  Patient will identify positive stress management techniques. Patient will identify benefits of using stress management post d/c.  Group Description:  Meditation.  LRT played a meditation that focused on setting boundaries with the people around you.  Meditation emphasized the point of saying no to things and the wishes of others is ok in order to put your feelings and desires first.     Affect/Mood: Appropriate   Participation Level: Active   Participation Quality: Independent   Behavior: Appropriate   Speech/Thought Process: Focused   Insight: Good   Judgement: Good   Modes of Intervention: Meditation   Patient Response to Interventions:  Engaged   Education Outcome:  Acknowledges education and In group clarification offered    Clinical Observations/Individualized Feedback: Pt attended and participated in group.    Plan: Continue to engage patient in RT group sessions 2-3x/week.   Victorino Sparrow, LRT,CTRS 07/28/2021 10:53 AM
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
## 2021-07-28 NOTE — Progress Notes (Signed)
Progress note  Pt found in bed; compliant with medication administration. Pt still has c/o visual and auditory hallucinations. Pt states they also had sleep disturbances last night. Pt was educated on staying awake throughout the day to improve sleep at night. Pt was also educated on taking their nicotine patch off before sleeping to improve sleep quality. Pt stated they only want to sleep because it's the only time the hallucinations aren't there. Pt continues to lack self care. Pt is pleasant but continues to be isolative. Pt expresses passive si with no plan. Pt contracts for safety.  A: Pt provided support and encouragement. Pt given medication per protocol and standing orders. Q56m safety checks implemented and continued.  R: Pt safe on the unit. Will continue to monitor.

## 2021-07-28 NOTE — Progress Notes (Signed)
   07/27/21 2054  Psych Admission Type (Psych Patients Only)  Admission Status Voluntary  Psychosocial Assessment  Patient Complaints Anxiety  Eye Contact Brief  Facial Expression Anxious;Sullen;Sad;Worried  Affect Anxious;Depressed;Sad;Sullen  Speech Logical/coherent  Interaction Assertive  Motor Activity Slow  Appearance/Hygiene Improved  Behavior Characteristics Cooperative  Mood Depressed;Anxious  Thought Process  Coherency Concrete thinking  Content Blaming others  Delusions Persecutory  Perception Hallucinations  Hallucination Auditory;Visual  Judgment Poor  Confusion Mild  Danger to Self  Current suicidal ideation? Passive  Self-Injurious Behavior Some self-injurious ideation observed or expressed.  No lethal plan expressed   Agreement Not to Harm Self Yes  Description of Agreement verbal  Danger to Others  Danger to Others None reported or observed   D: Pt alert and oriented. Pt rates his anxiety 8/10 and depression 8/10 at this time. Pt denies rates lower back pain 8/10 pain at this time. Pt endorses passive SI, denies HI, or AVH at this time.   A: Scheduled medications administered to pt, per MD orders. Tylenol 650 mg po prn and Vistaril 25 mg po prn given at 2054 for c/o pain and anxiety. Support and encouragement provided. Frequent verbal contact made. Routine safety checks conducted q15 minutes.  R: No adverse drug reactions noted. Pt. Rate pain 5/10 and decrease in anxiety at reassessment.Pt is agreeable to notify staff with any safety concerns. Pt complaint with medications. Pt interacts appropriately with others on the unit. Pt remains safe at this time. Will continue to monitor.

## 2021-07-28 NOTE — Progress Notes (Signed)
Patient did attend the evening speaker AA meeting.  
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
## 2021-07-28 NOTE — Group Note (Signed)
LCSW Group Therapy Note   Group Date: 07/28/2021 Start Time: 1300 End Time: 1400   Topic: Coping Skills  Due to the acuity and a high number of new admissions, group was not held. Patient was provided therapeutic worksheets and asked to meet with CSW as needed.     Mliss Fritz, LCSWA 07/28/2021  12:53 PM

## 2021-07-29 DIAGNOSIS — F250 Schizoaffective disorder, bipolar type: Principal | ICD-10-CM

## 2021-07-29 NOTE — Progress Notes (Signed)
   07/29/21 1000  Psych Admission Type (Psych Patients Only)  Admission Status Voluntary  Psychosocial Assessment  Patient Complaints Depression  Eye Contact Fair  Facial Expression Sullen;Sad;Worried  Affect Depressed;Sad;Sullen  Speech Logical/coherent  Interaction Assertive  Motor Activity Slow  Appearance/Hygiene Improved  Behavior Characteristics Cooperative  Mood Depressed;Pleasant  Thought Process  Coherency Concrete thinking  Content WDL  Delusions None reported or observed  Perception Hallucinations  Hallucination Auditory;Visual  Judgment Poor  Confusion None  Danger to Self  Current suicidal ideation? Passive  Self-Injurious Behavior Some self-injurious ideation observed or expressed.  No lethal plan expressed   Agreement Not to Harm Self Yes  Description of Agreement verbal  Danger to Others  Danger to Others None reported or observed

## 2021-07-29 NOTE — BHH Suicide Risk Assessment (Signed)
[place] INPATIENT:  Family/Significant Other Suicide Prevention Education  Suicide Prevention Education:  Education Completed; Garlan Fair,  (brother) has been identified by the patient as the family member/significant other with whom the patient will be residing, and identified as the person(s) who will aid the patient in the event of a mental health crisis (suicidal ideations/suicide attempt).  With written consent from the patient, the family member/significant other has been provided the following suicide prevention education, prior to the and/or following the discharge of the patient.  The suicide prevention education provided includes the following: Suicide risk factors Suicide prevention and interventions National Suicide Hotline telephone number [facility] assessment telephone number [facility] Emergency Assistance [place] and/or Residential Mobile Crisis Unit telephone number  Request made of family/significant other to: Remove weapons (e.g., guns, rifles, knives), all items previously/currently identified as safety concern.   Remove drugs/medications (over-the-counter, prescriptions, illicit drugs), all items previously/currently identified as a safety concern.  The family member/significant other verbalizes understanding of the suicide prevention education information provided.  The family member/significant other agrees to remove the items of safety concern listed above.  Patients brother stated he will make sure he has no access to weapons and does not feel patient is a danger to himself or others.   Larene Beach  Tedd Cottrill 07/29/2021, 12:54 PM

## 2021-07-29 NOTE — Progress Notes (Signed)
Patient reports decrease in auditory and visual hallucinations since taking abilify. He attended group and Probation officer informed him of hs medications which he agreed to take. He is pleasant and calm. Support given and safety maintained with 15 min checks.

## 2021-07-29 NOTE — B[facility] Group Notes (Signed)
Adult Psychoeducational Group Note  Date:  07/29/2021 Time:  8:36 PM  Group Topic/Focus:  Wrap-Up Group:   The focus of this group is to help patients review their daily goal of treatment and discuss progress on daily workbooks.  Participation Level:  Active  Participation Quality:  Appropriate and Attentive  Affect:  Appropriate  Cognitive:  Alert and Appropriate  Insight: Appropriate and Good  Engagement in Group:  Distracting and Off Topic  Modes of Intervention:  Discussion and Education  Additional Comments:  Pt attended and participated in wrap up group this evening and rated their day a 10/10. Pt stated that they are no longer having any Auditory or visual  hallucinations. Pt stated that their ongoing goal is to color and to go to groups and states that coloring helps to keep them calm.    Cristi Loron 07/29/2021, 8:36 PM

## 2021-07-29 NOTE — Progress Notes (Addendum)
[facility] MD Progress Note  07/29/2021 12:47 PM Mardel Shamera [name]  MRN:  [id number] Subjective:  Amanda "Amanda [name]" Pursifull is a transgender FTM patient w/ PPH of "schizoaffective disorder, OCD, PTSD, Bipolar disorder" who presented to [facility] endorsing worsening AVH, decreased sleep, worsening depression, and suicidal thoughts and cutting himself for the last 2 days prior to ED evaluation.   Case was discussed in the multidisciplinary team. MAR was reviewed and patient was compliant with medications.  She did not require any PRN's for agitation.     Psychiatric Team made the following recommendations yesterday:  - Re-start home Prozac 20mg  - Increase home Abilify from 15 mg once daily to 20mg  once daily  - Re-start home trazodone 100mg  QHS    Daily notes: Patient reports that his mood is good but he is "still tired." Patient reports that he slept well last night and had no dreams or nightmares.  He reported a good appetite. Patient is attending group therapy and getting along well with others on the unit. Patient reports that he has no urges to cut today and denies auditory and visual hallucinations. He is excited to talk about his 3 year ols son. He stated his son is in foster care and he has 2 more supervised visits and then can have him 4 hours per week unsupervised. He stated he is working very hard to get his son back full time. He stated he waited 3 years but finally got an apartment one month ago. Patient denies SI and HI. Patient reports that he finds group beneficial and is working on coping skills to better deal  with the negative relationship he has with his father. Patient reports that he has been having good phone conversations with his brother and feels supported by him. Patient denies depression and anxiety today. Patient is pleasant on approach, calm and cooperative. He is taking his medications and has no complaint of side effects.   Principal Problem: Schizoaffective disorder, bipolar type  ([place]) Diagnosis: Principal Problem:   Schizoaffective disorder, bipolar type ([place]) Active Problems:   Borderline personality disorder (HCC)   PTSD (post-traumatic stress disorder)   OCD (obsessive compulsive disorder)  Total Time spent with patient: 15 minutes  Past Psychiatric History: See H&P  Past Medical History:  Past Medical History:  Diagnosis Date   Anxiety    Asthma    Asthma due to environmental allergies    Asthma due to seasonal allergies    Bipolar 1 disorder ([place])    Borderline personality disorder ([place])    Brain bleed (HCC)    Chronic post-traumatic stress disorder (PTSD)    complex chronic with psychotic features and self harm behaviors   Complication of anesthesia    Constipation    Dander (animal) allergy    Hearing loss    right ear   Heart murmur    denies seeing a cardiologist   Hip dysplasia    Hypertension    Gestional    Major depression, chronic    Mood disorder (HCC)    OCD (obsessive compulsive disorder)    Pneumonia    PONV (postoperative nausea and vomiting)    PTSD (post-traumatic stress disorder)    RA (rheumatoid arthritis) ([place])    Rheumatoid arthritis ([place])    Schizophrenia ([place])    "I think that is wrong"   Seizure disorder ([place])    Suicidal ideations    Suicide attempt [facility])     Past Surgical History:  Procedure Laterality Date   Brain  Shunt  1981   "a few hours old"   EYE SURGERY Bilateral [place] (IUD) INSERTION N/A 09/16/2018   Procedure: INTRAUTERINE DEVICE (IUD) INSERTION;  Surgeon: Lavonia Drafts, MD;  Location: [facility];  Service: Gynecology;  Laterality: N/A;   PERINEUM REPAIR N/A 09/16/2018   Procedure: EPISIOTOMY REVISION;  Surgeon: Lavonia Drafts, MD;  Location: [facility];  Service: Gynecology;  Laterality: N/A;   SHUNT REMOVAL  1983   Tooth Removal     multiple   Family History:  Family History  Problem Relation Age of Onset   Heart attack Mother    Stroke Mother    Liver  cancer Mother    Diabetes Mother    Lung cancer Mother    Alcoholism Father    Sleep apnea Brother    Depression Brother    ADD / ADHD Brother    Diabetes Maternal Aunt    Diabetes Paternal Uncle    Colon cancer Paternal Uncle    Diabetes Maternal Grandmother    Dementia Maternal Grandmother    Family Psychiatric  History: See H&P Social History:  Social History   Substance and Sexual Activity  Alcohol Use Not Currently     Social History   Substance and Sexual Activity  Drug Use Not Currently   Types: Marijuana   Comment: sober from from drugs     Social History   Socioeconomic History   Marital status: Single    Spouse name: Not on file   Number of children: 1   Years of education: 12   Highest education level: 12th grade  Occupational History   Not on file  Tobacco Use   Smoking status: Every Day    Packs/day: 0.50    Years: 26.00    Pack years: 13.00    Types: Cigarettes   Smokeless tobacco: Never  Vaping Use   Vaping Use: Former  Substance and Sexual Activity   Alcohol use: Not Currently   Drug use: Not Currently    Types: Marijuana    Comment: sober from from drugs    Sexual activity: Not Currently    Birth control/protection: None  Other Topics Concern   Not on file  Social History Narrative   ** Merged History Encounter **       Lives with her fiance, friend, and fiance's brother   Right handed   Social Determinants of Health   Financial Resource Strain: Not on file  Food Insecurity: Not on file  Transportation Needs: Not on file  Physical Activity: Not on file  Stress: Not on file  Social Connections: Not on file   Additional Social History:               Sleep: Good, improved   Appetite:  Good  Current Medications: Current Facility-Administered Medications  Medication Dose Route Frequency Provider Last Rate Last Admin   acetaminophen (TYLENOL) tablet 650 mg  650 mg Oral Q6H PRN Lucky Rathke, FNP   650 mg at 07/27/21 2052    alum & mag hydroxide-simeth (MAALOX/MYLANTA) 200-200-20 MG/5ML suspension 30 mL  30 mL Oral Q4H PRN Lucky Rathke, FNP       ARIPiprazole (ABILIFY) tablet 20 mg  20 mg Oral Daily Massengill, Nathan, MD   20 mg at 07/29/21 0821   FLUoxetine (PROZAC) capsule 20 mg  20 mg Oral Daily Lucky Rathke, FNP   20 mg at 07/29/21 0821   hydrOXYzine (ATARAX) tablet 25 mg  25 mg  Oral TID PRN Lucky Rathke, FNP   25 mg at 07/28/21 2151   magnesium hydroxide (MILK OF MAGNESIA) suspension 30 mL  30 mL Oral Daily PRN Lucky Rathke, FNP       nicotine (NICODERM CQ - dosed in mg/24 hours) patch 14 mg  14 mg Transdermal Daily Nelda Marseille, Latria Mccarron E, MD   14 mg at 07/29/21 0820   traZODone (DESYREL) tablet 150 mg  150 mg Oral QHS Damita Dunnings B, MD   150 mg at 07/28/21 2151    Lab Results:  Results for orders placed or performed during the hospital encounter of 07/27/21 (from the past 48 hour(s))  Urinalysis, Complete w Microscopic Urine, Clean Catch     Status: Abnormal   Collection Time: 07/28/21  8:42 AM  Result Value Ref Range   Color, Urine YELLOW YELLOW   APPearance CLEAR CLEAR   Specific Gravity, Urine 1.025 1.005 - 1.030   pH 5.5 5.0 - 8.0   Glucose, UA NEGATIVE NEGATIVE mg/dL   Hgb urine dipstick TRACE (A) NEGATIVE   Bilirubin Urine NEGATIVE NEGATIVE   Ketones, ur NEGATIVE NEGATIVE mg/dL   Protein, ur NEGATIVE NEGATIVE mg/dL   Nitrite NEGATIVE NEGATIVE   Leukocytes,Ua SMALL (A) NEGATIVE   RBC / HPF 0-5 0 - 5 RBC/hpf   WBC, UA 6-10 0 - 5 WBC/hpf   Bacteria, UA RARE (A) NONE SEEN   Squamous Epithelial / LPF 0-5 0 - 5   Mucus PRESENT     Comment: Performed at [facility], [place] [address], [place], [place] [address]  CBC     Status: Abnormal   Collection Time: 07/28/21  6:24 PM  Result Value Ref Range   WBC 14.5 (H) 4.0 - 10.5 K/uL   RBC 5.06 3.87 - 5.11 MIL/uL   Hemoglobin 15.8 (H) 12.0 - 15.0 g/dL   HCT 48.4 (H) 36.0 - 46.0 %   MCV 95.7 80.0 - 100.0 fL   MCH 31.2 26.0 - 34.0  pg   MCHC 32.6 30.0 - 36.0 g/dL   RDW 13.1 11.5 - 15.5 %   Platelets 285 150 - 400 K/uL   nRBC 0.0 0.0 - 0.2 %    Comment: Performed at [facility], [place] [address] [place], [place] [address]  Comprehensive metabolic panel     Status: Abnormal   Collection Time: 07/28/21  6:24 PM  Result Value Ref Range   Sodium 137 135 - 145 mmol/L   Potassium 3.7 3.5 - 5.1 mmol/L   Chloride 104 98 - 111 mmol/L   CO2 24 22 - 32 mmol/L   Glucose, Bld 117 (H) 70 - 99 mg/dL    Comment: Glucose reference range applies only to samples taken after fasting for at least 8 hours.   BUN 17 6 - 20 mg/dL   Creatinine, Ser 0.68 0.44 - 1.00 mg/dL   Calcium 9.5 8.9 - 10.3 mg/dL   Total Protein 7.5 6.5 - 8.1 g/dL   Albumin 4.3 3.5 - 5.0 g/dL   AST 14 (L) 15 - 41 U/L   ALT 10 0 - 44 U/L   Alkaline Phosphatase 73 38 - 126 U/L   Total Bilirubin 0.5 0.3 - 1.2 mg/dL   GFR, Estimated >60 >60 mL/min    Comment: (NOTE) Calculated using the CKD-EPI Creatinine Equation (2021)    Anion gap 9 5 - 15    Comment: Performed at [facility], [place] [address], [place], [place] [address]  Blood Alcohol level:  Lab Results  Component Value Date   ETH <10 07/26/2021   ETH <10 16/05/9603    Metabolic Disorder Labs: Lab Results  Component Value Date   HGBA1C 5.2 07/26/2021   MPG 102.54 07/26/2021   MPG 111 12/03/2020   Lab Results  Component Value Date   PROLACTIN 39.6 (H) 07/26/2021   PROLACTIN 36.9 (H) 11/11/2018   Lab Results  Component Value Date   CHOL 161 07/26/2021   TRIG 116 07/26/2021   HDL 36 (L) 07/26/2021   CHOLHDL 4.5 07/26/2021   VLDL 23 07/26/2021   LDLCALC 102 (H) 07/26/2021   LDLCALC 108 (H) 12/03/2020    Physical Findings: AIMS: Facial and Oral Movements Muscles of Facial Expression: None, normal Lips and Perioral Area: None, normal Jaw: None, normal Tongue: None, normal,Extremity Movements Upper (arms, wrists, hands, fingers): None,  normal Lower (legs, knees, ankles, toes): None, normal, Trunk Movements Neck, shoulders, hips: None, normal, Overall Severity Severity of abnormal movements (highest score from questions above): None, normal Incapacitation due to abnormal movements: None, normal Patient's awareness of abnormal movements (rate only patient's report): No Awareness, Dental Status Current problems with teeth and/or dentures?: No Does patient usually wear dentures?: No  CIWA:    COWS:     Musculoskeletal: Strength & Muscle Tone: within normal limits Gait & Station: normal Patient leans: N/A  Psychiatric Specialty Exam:  Presentation  General Appearance: Appropriate for Environment; Casual  Eye Contact:Good  Speech:Clear and Coherent; Normal Rate  Speech Volume:Normal  Handedness:Right  Mood and Affect  Mood:Euthymic  Affect:Appropriate; Congruent   Thought Process  Thought Processes:Goal Directed; Coherent  Descriptions of Associations:Intact  Orientation:Full (Time, Place and Person)  Thought Content:Logical; WDL  History of Schizophrenia/Schizoaffective disorder:No data recorded Duration of Psychotic Symptoms:Less than six months  Hallucinations:Hallucinations: None (patient denies today)  Ideas of Reference:None  Suicidal Thoughts:Suicidal Thoughts: No  Homicidal Thoughts:Homicidal Thoughts: No  Sensorium  Memory:Immediate Good; Recent Good  Judgment:Fair  Insight:Fair  Executive Functions  Concentration:Good  Attention Span:Good  [place] of Knowledge:Good  Language:Good   Psychomotor Activity  Psychomotor Activity:Psychomotor Activity: Normal  Assets  Assets:Resilience; Housing; Desire for Improvement; Communication Skills; Financial Resources/Insurance; Social Support  Sleep  Sleep:Sleep: Good  Physical Exam: Physical Exam Vitals and nursing note reviewed.  Constitutional:      Appearance: Normal appearance.  HENT:     Head:  Normocephalic and atraumatic.  Eyes:     Pupils: Pupils are equal, round, and reactive to light.  Pulmonary:     Effort: Pulmonary effort is normal.  Musculoskeletal:        General: Normal range of motion.     Cervical back: Normal range of motion.  Neurological:     Mental Status: He is alert and oriented to person, place, and time.  Psychiatric:        Attention and Perception: Attention and perception normal. He does not perceive auditory or visual hallucinations.        Mood and Affect: Mood and affect normal.        Speech: Speech normal.        Behavior: Behavior normal. Behavior is cooperative.        Thought Content: Thought content normal. Thought content is not paranoid or delusional. Thought content does not include homicidal or suicidal ideation. Thought content does not include homicidal or suicidal plan.        Cognition and Memory: Cognition normal.   Review of Systems  Constitutional: Negative.  Negative for fever.  HENT: Negative.  Negative for congestion and sore throat.   Respiratory: Negative.  Negative for cough.   Cardiovascular: Negative.   Musculoskeletal: Negative.   Neurological: Negative.   Psychiatric/Behavioral:  Negative for hallucinations and suicidal ideas.    Blood pressure (!) 80/64, pulse 95, temperature (!) 97.3 F (36.3 C), resp. rate 16, height 5\' 7"  (1.702 m), weight 79.4 kg, SpO2 99 %. Body mass index is 27.41 kg/m.   Treatment Plan Summary: Daily contact with patient to assess and evaluate symptoms and progress in treatment and Medication management Amanda [name] is a 40 yo transgender FTM patient who presented endorsing symptoms of psychosis. Patient reports that her AH is decreasing but still there and her VH remains unchanged. Her urge to cut has decreased some. Patient appears to ruminate on her relationship with her father but was receptive to feedback about this and will try to use groups to learn ways to decrease this. Patient's sleep  continues to be of concern unfortunately patient has a hx of symptomatic hypotension with first line agents for nightmares related to PTSD.   Labs Reviewed: UDS (+ THC), U preg (-), CBC- WBC 14.2, Hgb 15.5, CMP- T protein 6.3, HgbA1c- WNL, Mg- WNL, Etoh- (-), Lipid panel- HDL-36, TSH- WNL, prolactin- WNL   EKG- QTC 424 Schizoaffective disorder, bipolar type PTSD OCD - Continue home Prozac 20mg  - Continue home Abilify 20mg  - Continue Trazodone to 150mg  QHS  Leukocytosis -Repeat  WBC 14.5  - UA improved with rare bacteria, trace Hgb, small leukocytes - will send for urine culture   PRN -Tylenol 650mg  q6h, pain -Maalox 28ml q4h, indigestion -Atarax 25mg  TID, anxiety -Milk of Mag 3mL, constipation   Safety: At this time patient appears to be of low risk of harm to self or others. Recommend q15 min checks.    Ethelene Hal, NP 07/29/2021, 12:52 PM

## 2021-07-29 NOTE — [facility] Group Notes (Signed)
.  Psychoeducational Group Note    Date:07/29/2021 Time: 1300-1400    Purpose of Group: . The group focus' on teaching patients on how to identify their needs and their Life Skills:  A group where two lists are made. What people need and what are things that we do that are unhealthy. The lists are developed by the patients and it is explained that we often do the actions that are not healthy to get our list of needs met.  Goal:: to develop the coping skills needed to get their needs met  Participation Level:  Active  Participation Quality:  Appropriate  Affect:  Appropriate  Cognitive:  Oriented  Insight:  Improving  Engagement in Group:  Engaged  Additional Comments:  Pt rates himself at 8/10.  Interacts with his peers well and responds well to peers input.  Paulino Rily

## 2021-07-29 NOTE — Group Note (Signed)
LCSW Group Therapy Note  07/29/2021    10:00-11:00am   Type of Therapy and Topic:  Group Therapy: Early Messages Received About Anger  Participation Level:  Active   Description of Group:   In this group, patients shared and discussed the early messages received in their lives about anger through parental or other adult modeling, teaching, repression, punishment, violence, and more.  Participants identified how those childhood lessons influence even now how they usually or often react when angered.  The group discussed that anger is a secondary emotion and what may be the underlying emotional themes that come out through anger outbursts or that are ignored through anger suppression.    Therapeutic Goals: Patients will identify one or more childhood message about anger that they received and how it was taught to them. Patients will discuss how these childhood experiences have influenced and continue to influence their own expression or repression of anger even today. Patients will explore possible primary emotions that tend to fuel their secondary emotion of anger. Patients will learn that anger itself is normal and cannot be eliminated, and that healthier coping skills can assist with resolving conflict rather than worsening situations.  Summary of Patient Progress:  The patient shared that their childhood lessons about anger were that alcohol was equivalent to violence.  As a result, they now avoid interactions or settings with alcohol.  The patient participated fully and demonstrated insight.  Therapeutic Modalities:   Cognitive Behavioral Therapy Motivation Interviewing  [place], [place] 07/29/2021 11:25 AM

## 2021-07-29 NOTE — BHH Group Notes (Signed)
Goals Group 07/29/21    Group Focus: affirmation, clarity of thought, and goals/reality orientation Treatment Modality:  Psychoeducation Interventions utilized were assignment, group exercise, and support Purpose: To be able to understand and verbalize the reason for their admission to the hospital. To understand that the medication helps with their chemical imbalance but they also need to work on their choices in life. To be challenged to develop a list of 30 positives about themselves. Also introduce the concept that "feelings" are not reality.  Participation Level:  Active  Participation Quality:  Appropriate  Affect:  Appropriate  Cognitive:  Appropriate  Insight:  Improving  Engagement in Group:  Engaged  Additional Comments:  Attended the group. Rates his energy at a 9/10. States he was having audio/visual hallucinations along with feelings of self harm. Spoke up in the group giving and receiving feedback  Paulino Rily

## 2021-07-30 ENCOUNTER — Encounter (HOSPITAL_COMMUNITY): Payer: Self-pay | Admitting: Family

## 2021-07-30 DIAGNOSIS — F4310 Post-traumatic stress disorder, unspecified: Secondary | ICD-10-CM

## 2021-07-30 DIAGNOSIS — F603 Borderline personality disorder: Secondary | ICD-10-CM

## 2021-07-30 MED ORDER — NITROFURANTOIN MONOHYD MACRO 100 MG PO CAPS
100.0000 mg | ORAL_CAPSULE | Freq: Two times a day (BID) | ORAL | Status: DC
  Administered 2021-07-30 – 2021-08-01 (×5): 100 mg via ORAL
  Filled 2021-07-30 (×9): qty 1

## 2021-07-30 NOTE — Group Note (Signed)
Date:  07/30/2021 Time:  10:34 AM  Group Topic/Focus:  Goals Group:   The focus of this group is to help patients establish daily goals to achieve during treatment and discuss how the patient can incorporate goal setting into their daily lives to aide in recovery.    Participation Level:  Active  Participation Quality:  Appropriate  Affect:  Appropriate  Cognitive:  Appropriate  Insight: Appropriate  Engagement in Group:  Engaged  Modes of Intervention:  Discussion  Additional Comments:  Pt wants to talk to family and friends for support, and continue group therapy.  Garvin Fila 07/30/2021, 10:34 AM

## 2021-07-30 NOTE — Group Note (Signed)
[place] LCSW Group Therapy Note  Date/Time:  07/30/2021 10:00-11:00AM  Type of Therapy and Topic:  Group Therapy:  Healthy and Unhealthy Supports plus being "My Own Hero"  Participation Level:  Active   Description of Group: Patients in this group were invited to identify the differences between healthy and unhealthy supports and then to identify those people in their lives who fall into one category or the other, as well as why.  They were then introduced to the idea of adding more healthy supports and decreasing the unhealthy ones.  Patients discussed what additional healthy supports could be helpful in their recovery and wellness after discharge in order to maintain stability.   An emphasis was placed on using counselor, doctor, therapy groups, 12-step groups, and problem-specific support groups to expand supports.  The song "My Own Hero" was played to encourage full participation in their own recovery journey instead of expecting others to do all the work for them.  The song "I Know Where I've Been" was played as further encouragement that they are further in their journey than they were previously.  Therapeutic Goals:   1)  discuss importance of adding supports to stay well once out of the hospital  2)  compare healthy versus unhealthy supports and identify some examples of each  3)  generate ideas and descriptions of healthy supports that can be added  4)  offer mutual support about how to address unhealthy supports  5)  encourage active participation in and adherence to discharge plan    Summary of Patient Progress:  The patient stated that current healthy supports in their life are their sisters and brothers, while current unhealthy supports include their father (who triggers him). The patient expressed a willingness to add appropriate support(s) to help in their recovery journey.   Therapeutic Modalities:   Motivational Interviewing Brief Solution-Focused Therapy  [place], [place] 07/30/2021 1:03 PM

## 2021-07-30 NOTE — Progress Notes (Addendum)
   07/30/21 1400  Psych Admission Type (Psych Patients Only)  Admission Status Voluntary  Psychosocial Assessment  Patient Complaints None  Eye Contact Fair  Facial Expression Animated  Affect Depressed;Sad  Speech Logical/coherent  Interaction Assertive  Motor Activity Slow  Appearance/Hygiene Improved  Behavior Characteristics Cooperative;Appropriate to situation  Mood Pleasant  Thought Process  Coherency Concrete thinking  Content WDL  Delusions None reported or observed  Perception Hallucinations  Hallucination None reported or observed  Judgment Poor  Confusion None  Danger to Self  Current suicidal ideation? Denies  Self-Injurious Behavior No self-injurious ideation or behavior indicators observed or expressed   Agreement Not to Harm Self No  Description of Agreement verbal  Danger to Others  Danger to Others None reported or observed  D. Pt has been friendly, and smiles upon approach- appropriate during interactions. Pt observed attending groups today. Per pt's self inventory, pt rated his depression,hopelessness and anxiety all 0's. Pt's goal today was to work on "talking to friends and family, coloring, and going to groups". Pt currently denies SI/HI and AVH A. Labs and vitals monitored. Pt given and educated on medications. Pt supported emotionally and encouraged to express concerns and ask questions.   R. Pt remains safe with 15 minute checks. Will continue POC.

## 2021-07-30 NOTE — Progress Notes (Addendum)
[facility] MD Progress Note  07/30/2021 12:01 PM Bindu Matia Zelada  MRN:  [id number]  Subjective:  Amanda [name] is a transgender FTM patient w/ PPH of "schizoaffective disorder, OCD, PTSD, Bipolar disorder" who presented to [facility] endorsing worsening AVH, decreased sleep, worsening depression, and suicidal thoughts and cutting himself for the last 2 days prior to ED evaluation.   [name], 40 y.o., adult patient seen face to face by this provider, chart reviewed, discussed with multidisciplinary team, and consulted with Dr. Viann Fish  on 07/30/21.  On evaluation Teniqua Audrielle Vankuren reports he feels fine.  "I feel ready to go home."  Discussed abnormal urinalysis with patient.  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  Patient reporting last infection was around July or August of this year.  Results of urine culture not back.   Patient states that he is tolerating medications without adverse reaction; eating/sleeping without difficulty; and attending/participating in group sessions.  Patient denies suicidal/self-harm/homicidal ideation, psychosis, and paranoia.  Patient reporting that he already has outpatient psychiatric services at [facility] which is only walking distance from his home.  During evaluation Zamarah Trella Thurmond is sitting upright in chair, no distress noted.  He is alert/oriented x 4; calm/cooperative; and mood congruent with affect.  He is speaking in a clear tone at moderate volume, and normal pace; with good eye contact.  His thought process is coherent and relevant; There is no indication that he is currently responding to internal/external stimuli or experiencing delusional thought content; and he denies suicidal/self-harm/homicidal ideation, psychosis, and paranoia.   Patient has remained calm throughout assessment and has answered questions appropriately.    Principal Problem: Schizoaffective  disorder, bipolar type ([place]) Diagnosis: Principal Problem:   Schizoaffective disorder, bipolar type ([place]) Active Problems:   Borderline personality disorder (HCC)   PTSD (post-traumatic stress disorder)   OCD (obsessive compulsive disorder)  Total Time spent with patient: 20 minutes  Past Psychiatric History: See H&P  Past Medical History:  Past Medical History:  Diagnosis Date   Anxiety    Asthma    Asthma due to environmental allergies    Asthma due to seasonal allergies    Bipolar 1 disorder ([place])    Borderline personality disorder ([place])    Brain bleed (HCC)    Chronic post-traumatic stress disorder (PTSD)    complex chronic with psychotic features and self harm behaviors   Complication of anesthesia    Constipation    Dander (animal) allergy    Hearing loss    right ear   Heart murmur    denies seeing a cardiologist   Hip dysplasia    Hypertension    Gestional    Major depression, chronic    Mood disorder (HCC)    OCD (obsessive compulsive disorder)    Pneumonia    PONV (postoperative nausea and vomiting)    PTSD (post-traumatic stress disorder)    RA (rheumatoid arthritis) ([place])    Rheumatoid arthritis ([place])    Schizophrenia ([place])    "I think that is wrong"   Seizure disorder ([place])    Suicidal ideations    Suicide attempt [facility])     Past Surgical History:  Procedure Laterality Date   Brain Shunt  1981   "a few hours old"   EYE SURGERY Bilateral [place] (IUD) INSERTION N/A 09/16/2018   Procedure: INTRAUTERINE DEVICE (IUD) INSERTION;  Surgeon: Lavonia Drafts, MD;  Location: [facility]  ORS;  Service: Gynecology;  Laterality: N/A;   PERINEUM REPAIR N/A 09/16/2018   Procedure: EPISIOTOMY REVISION;  Surgeon: Lavonia Drafts, MD;  Location: [place] ORS;  Service: Gynecology;  Laterality: N/A;   SHUNT REMOVAL  1983   Tooth Removal     multiple   Family History:  Family History  Problem Relation Age of Onset   Heart attack Mother     Stroke Mother    Liver cancer Mother    Diabetes Mother    Lung cancer Mother    Alcoholism Father    Sleep apnea Brother    Depression Brother    ADD / ADHD Brother    Diabetes Maternal Aunt    Diabetes Paternal Uncle    Colon cancer Paternal Uncle    Diabetes Maternal Grandmother    Dementia Maternal Grandmother    Family Psychiatric  History: See H&P Social History:  Social History   Substance and Sexual Activity  Alcohol Use Not Currently     Social History   Substance and Sexual Activity  Drug Use Not Currently   Types: Marijuana   Comment: sober from from drugs     Social History   Socioeconomic History   Marital status: Single    Spouse name: Not on file   Number of children: 1   Years of education: 12   Highest education level: 12th grade  Occupational History   Not on file  Tobacco Use   Smoking status: Every Day    Packs/day: 0.50    Years: 26.00    Pack years: 13.00    Types: Cigarettes   Smokeless tobacco: Never  Vaping Use   Vaping Use: Former  Substance and Sexual Activity   Alcohol use: Not Currently   Drug use: Not Currently    Types: Marijuana    Comment: sober from from drugs    Sexual activity: Not Currently    Birth control/protection: None  Other Topics Concern   Not on file  Social History Narrative   ** Merged History Encounter **       Lives with her fiance, friend, and fiance's brother   Right handed   Social Determinants of Health   Financial Resource Strain: Not on file  Food Insecurity: Not on file  Transportation Needs: Not on file  Physical Activity: Not on file  Stress: Not on file  Social Connections: Not on file   Additional Social History:    Sleep: Good, improved   Appetite:  Good  Current Medications: Current Facility-Administered Medications  Medication Dose Route Frequency Provider Last Rate Last Admin   acetaminophen (TYLENOL) tablet 650 mg  650 mg Oral Q6H PRN Lucky Rathke, FNP   650 mg at  07/27/21 2052   alum & mag hydroxide-simeth (MAALOX/MYLANTA) 200-200-20 MG/5ML suspension 30 mL  30 mL Oral Q4H PRN Lucky Rathke, FNP       ARIPiprazole (ABILIFY) tablet 20 mg  20 mg Oral Daily Massengill, Nathan, MD   20 mg at 07/30/21 0742   FLUoxetine (PROZAC) capsule 20 mg  20 mg Oral Daily Lucky Rathke, FNP   20 mg at 07/30/21 0741   hydrOXYzine (ATARAX) tablet 25 mg  25 mg Oral TID PRN Lucky Rathke, FNP   25 mg at 07/29/21 2119   magnesium hydroxide (MILK OF MAGNESIA) suspension 30 mL  30 mL Oral Daily PRN Lucky Rathke, FNP       nicotine (NICODERM CQ - dosed in mg/24 hours) patch  14 mg  14 mg Transdermal Daily Harlow Asa, MD   14 mg at 07/30/21 2119   traZODone (DESYREL) tablet 150 mg  150 mg Oral QHS Damita Dunnings B, MD   150 mg at 07/29/21 2119    Lab Results:  Results for orders placed or performed during the hospital encounter of 07/27/21 (from the past 48 hour(s))  CBC     Status: Abnormal   Collection Time: 07/28/21  6:24 PM  Result Value Ref Range   WBC 14.5 (H) 4.0 - 10.5 K/uL   RBC 5.06 3.87 - 5.11 MIL/uL   Hemoglobin 15.8 (H) 12.0 - 15.0 g/dL   HCT 48.4 (H) 36.0 - 46.0 %   MCV 95.7 80.0 - 100.0 fL   MCH 31.2 26.0 - 34.0 pg   MCHC 32.6 30.0 - 36.0 g/dL   RDW 13.1 11.5 - 15.5 %   Platelets 285 150 - 400 K/uL   nRBC 0.0 0.0 - 0.2 %    Comment: Performed at [facility], [place] [address] [place], [place] [address]  Comprehensive metabolic panel     Status: Abnormal   Collection Time: 07/28/21  6:24 PM  Result Value Ref Range   Sodium 137 135 - 145 mmol/L   Potassium 3.7 3.5 - 5.1 mmol/L   Chloride 104 98 - 111 mmol/L   CO2 24 22 - 32 mmol/L   Glucose, Bld 117 (H) 70 - 99 mg/dL    Comment: Glucose reference range applies only to samples taken after fasting for at least 8 hours.   BUN 17 6 - 20 mg/dL   Creatinine, Ser 0.68 0.44 - 1.00 mg/dL   Calcium 9.5 8.9 - 10.3 mg/dL   Total Protein 7.5 6.5 - 8.1 g/dL   Albumin 4.3 3.5 - 5.0 g/dL    AST 14 (L) 15 - 41 U/L   ALT 10 0 - 44 U/L   Alkaline Phosphatase 73 38 - 126 U/L   Total Bilirubin 0.5 0.3 - 1.2 mg/dL   GFR, Estimated >60 >60 mL/min    Comment: (NOTE) Calculated using the CKD-EPI Creatinine Equation (2021)    Anion gap 9 5 - 15    Comment: Performed at [facility], [place] [address] [place], [place] [address]    Blood Alcohol level:  Lab Results  Component Value Date   [facility] <10 07/26/2021   ETH <10 18/56/3149    Metabolic Disorder Labs: Lab Results  Component Value Date   HGBA1C 5.2 07/26/2021   MPG 102.54 07/26/2021   MPG 111 12/03/2020   Lab Results  Component Value Date   PROLACTIN 39.6 (H) 07/26/2021   PROLACTIN 36.9 (H) 11/11/2018   Lab Results  Component Value Date   CHOL 161 07/26/2021   TRIG 116 07/26/2021   HDL 36 (L) 07/26/2021   CHOLHDL 4.5 07/26/2021   VLDL 23 07/26/2021   LDLCALC 102 (H) 07/26/2021   LDLCALC 108 (H) 12/03/2020    Physical Findings: AIMS: Facial and Oral Movements Muscles of Facial Expression: None, normal Lips and Perioral Area: None, normal Jaw: None, normal Tongue: None, normal,Extremity Movements Upper (arms, wrists, hands, fingers): None, normal Lower (legs, knees, ankles, toes): None, normal, Trunk Movements Neck, shoulders, hips: None, normal, Overall Severity Severity of abnormal movements (highest score from questions above): None, normal Incapacitation due to abnormal movements: None, normal Patient's awareness of abnormal movements (rate only patient's report): No Awareness, Dental Status Current problems with teeth and/or dentures?: No Does patient  usually wear dentures?: No  CIWA:    COWS:     Musculoskeletal: Strength & Muscle Tone: within normal limits Gait & Station: normal Patient leans: N/A  Psychiatric Specialty Exam:  Presentation  General Appearance: Appropriate for Environment  Eye Contact:Good  Speech:Clear and Coherent; Normal Rate  Speech  Volume:Normal  Handedness:Right  Mood and Affect  Mood:Euthymic  Affect:Appropriate; Congruent   Thought Process  Thought Processes:Coherent; Goal Directed  Descriptions of Associations:Intact  Orientation:Full (Time, Place and Person)  Thought Content:WDL; Logical  History of Schizophrenia/Schizoaffective disorder:No data recorded Duration of Psychotic Symptoms:Less than six months  Hallucinations:Hallucinations: None  Ideas of Reference:None  Suicidal Thoughts:Suicidal Thoughts: No  Homicidal Thoughts:Homicidal Thoughts: No  Sensorium  Memory:Immediate Good; Recent Good; Remote Good  Judgment:Intact  Insight:Present  Executive Functions  Concentration:Good  Attention Span:Good  [place] of Knowledge:Good  Language:Good   Psychomotor Activity  Psychomotor Activity:Psychomotor Activity: Normal  Assets  Assets:Communication Skills; Desire for Improvement; Financial Resources/Insurance; Housing; Social Support  Sleep  Sleep:Sleep: Good  Physical Exam: Physical Exam Vitals and nursing note reviewed.  Constitutional:      Appearance: Normal appearance.  Pulmonary:     Effort: Pulmonary effort is normal.  Musculoskeletal:        General: Normal range of motion.     Cervical back: Normal range of motion.  Neurological:     Mental Status: He is alert and oriented to person, place, and time.  Psychiatric:        Attention and Perception: Attention and perception normal. He does not perceive auditory or visual hallucinations.        Mood and Affect: Mood and affect normal.        Speech: Speech normal.        Behavior: Behavior normal. Behavior is cooperative.        Thought Content: Thought content is not paranoid or delusional. Thought content does not include homicidal or suicidal ideation.        Cognition and Memory: Cognition normal.   Review of Systems  Constitutional: Negative.  Negative for fever.  HENT: Negative.  Negative for  congestion and sore throat.   Respiratory: Negative.  Negative for cough.   Cardiovascular: Negative.   Genitourinary:  Positive for frequency.       Patient reporting chronic history of urinary tract infection at least every 3 to 4 months.  Patient also reporting burning with urination and frequency.  Informed would start on antibiotic and once culture is back may need to change depending on what is causing the urinary tract infection.  Musculoskeletal: Negative.   Neurological: Negative.   Psychiatric/Behavioral:  Negative for hallucinations and suicidal ideas. Depression: Denies.Nervous/anxious: Denies. Insomnia: Reports he is sleeping well.    Blood pressure 104/75, pulse (!) 102, temperature 97.8 F (36.6 C), resp. rate 16, height 5\' 7"  (1.702 m), weight 79.4 kg, SpO2 99 %. Body mass index is 27.41 kg/m.   Treatment Plan Summary: Daily contact with patient to assess and evaluate symptoms and progress in treatment and Medication management [name] is a 40 yo transgender FTM patient who presented endorsing symptoms of psychosis. Patient reports that his AH is decreasing but still there and her VH remains unchanged. His urge to cut has decreased some. Patient appears to ruminate on his relationship with his father but was receptive to feedback about this and will try to use groups to learn ways to decrease this. Patient's sleep continues to be of concern unfortunately patient has a hx  of symptomatic hypotension with first line agents for nightmares related to PTSD.   Labs Reviewed: UDS (+ THC), U preg (-), CBC- WBC 14.2, Hgb 15.5, CMP- T protein 6.3, HgbA1c- WNL, Mg- WNL, Etoh- (-), Lipid panel- HDL-36, TSH- WNL, prolactin- WNL   EKG- QTC 424  Schizoaffective disorder, bipolar type PTSD OCD - Continue home Prozac 20mg  - Continue home Abilify 20mg  - Continue Trazodone to 150mg  QHS  Leukocytosis -Repeat  WBC 14.5  - UA improved with rare bacteria, trace Hgb, small leukocytes - will send  for urine culture  Results of urine culture not in.  Patient has chronic history of urinary tract infections, also complaining of dysuria and frequency.  Will start nitrofurantoin 100 mg every 12 hours for 5 days.  Once urine culture is in antibiotic can be changed if needed.   PRN -Tylenol 650mg  q6h, pain -Maalox 80ml q4h, indigestion -Atarax 25mg  TID, anxiety -Milk of Mag 59mL, constipation   Safety: At this time patient appears to be of low risk of harm to self or others. Recommend q15 min checks.    Shuvon Rankin, NP 07/30/2021, 12:01 PM  I have reviewed the patient's chart and discussed the case with the APP. I agree with the assessment and plan of care as documented in the APP's note.   Viann Fish, MD, [facility]

## 2021-07-30 NOTE — Progress Notes (Signed)
   07/29/21 2230  Psych Admission Type (Psych Patients Only)  Admission Status Voluntary  Psychosocial Assessment  Patient Complaints Worrying  Eye Contact Fair  Facial Expression [place];Worried  Affect Depressed;Sad  Speech Logical/coherent  Interaction Assertive  Motor Activity Slow  Appearance/Hygiene Improved  Behavior Characteristics Cooperative;Appropriate to situation  Mood Depressed;Pleasant  Thought Process  Coherency Concrete thinking  Content WDL  Delusions None reported or observed  Perception Hallucinations  Hallucination Auditory;Visual  Judgment Poor  Confusion None  Danger to Self  Current suicidal ideation? Passive  Self-Injurious Behavior Some self-injurious ideation observed or expressed.  No lethal plan expressed   Agreement Not to Harm Self Yes  Description of Agreement verbal  Danger to Others  Danger to Others None reported or observed

## 2021-07-31 LAB — URINE CULTURE: Culture: <10000 — AB

## 2021-07-31 NOTE — Plan of Care (Signed)
?  Problem: Coping: ?Goal: Ability to verbalize frustrations and anger appropriately will improve ?Outcome: Progressing ?Goal: Ability to demonstrate self-control will improve ?Outcome: Progressing ?  ?Problem: Health Behavior/Discharge Planning: ?Goal: Compliance with treatment plan for underlying cause of condition will improve ?Outcome: Progressing ?  ?

## 2021-07-31 NOTE — Progress Notes (Signed)
Progress note  Pt found in bed; compliant with medication administration. Pt denies any physical complaints or pain. Pt continues to be reclusive to their room, but is reading instead of sleeping. Pt's affect has improved since last week. Pt feels their avh is improving as well. Pt is pleasant. Pt denies si/hi/ah/vh and verbally agrees to approach staff if these become apparent or before harming themselves/others while at [facility].  A: Pt provided support and encouragement. Pt given medication per protocol and standing orders. Q20m safety checks implemented and continued.  R: Pt safe on the unit. Will continue to monitor.

## 2021-07-31 NOTE — Progress Notes (Addendum)
[facility] MD Progress Note  07/31/2021 4:37 PM Amanda [name]  MRN:  [id number]  Subjective:  Amanda [name] is a transgender FTM patient w/ PPH of "schizoaffective disorder, OCD, PTSD, Bipolar disorder" who presented to [facility] endorsing worsening AVH, decreased sleep, worsening depression, and suicidal thoughts and cutting himself for the last 2 days prior to ED evaluation.   Case was discussed in the multidisciplinary team. MAR was reviewed and patient was compliant with medications.  He did not require any PRN's for agitation.     Psychiatric Team made the following recommendations yesterday:   - Continue home Prozac 20mg  - Continue home Abilify 20mg  - Continue Trazodone to 150mg  QHS   On assessment this a.m. patient reports that he is doing "good."  Patient reports he is also sleeping "good. "  Patient reports that he is now no longer having nightmares and endorses that he is only having his regular nighttime awakenings to go to the bathroom.  Patient reports that he is talking to his brother on the phone at night and endorses that he feels supported.  Patient denies SI, HI and AVH. He denies urges for self-harm, ideas of reference, or first rank symptoms. Patient states he feels stable and improved and denies medication side-effects. Patient reports that he has decided that it is best that he limit communication with his father.  Principal Problem: Schizoaffective disorder, bipolar type ([place]) Diagnosis: Principal Problem:   Schizoaffective disorder, bipolar type ([place]) Active Problems:   Borderline personality disorder (HCC)   PTSD (post-traumatic stress disorder)   OCD (obsessive compulsive disorder)  Total Time spent with patient: 15 minutes  Past Psychiatric History: See H&P  Past Medical History:  Past Medical History:  Diagnosis Date   Anxiety    Asthma    Asthma due to environmental allergies    Asthma due to seasonal allergies    Bipolar 1 disorder ([place])    Borderline  personality disorder ([place])    Brain bleed (HCC)    Chronic post-traumatic stress disorder (PTSD)    complex chronic with psychotic features and self harm behaviors   Complication of anesthesia    Constipation    Dander (animal) allergy    Hearing loss    right ear   Heart murmur    denies seeing a cardiologist   Hip dysplasia    Hypertension    Gestional    Major depression, chronic    Mood disorder (HCC)    OCD (obsessive compulsive disorder)    Pneumonia    PONV (postoperative nausea and vomiting)    PTSD (post-traumatic stress disorder)    RA (rheumatoid arthritis) ([place])    Rheumatoid arthritis ([place])    Schizophrenia ([place])    "I think that is wrong"   Seizure disorder ([place])    Suicidal ideations    Suicide attempt [facility])     Past Surgical History:  Procedure Laterality Date   Brain Shunt  1981   "a few hours old"   EYE SURGERY Bilateral Ronceverte (IUD) INSERTION N/A 09/16/2018   Procedure: INTRAUTERINE DEVICE (IUD) INSERTION;  Surgeon: Lavonia Drafts, MD;  Location: [facility];  Service: Gynecology;  Laterality: N/A;   PERINEUM REPAIR N/A 09/16/2018   Procedure: EPISIOTOMY REVISION;  Surgeon: Lavonia Drafts, MD;  Location: [facility];  Service: Gynecology;  Laterality: N/A;   SHUNT REMOVAL  1983   Tooth Removal     multiple   Family History:  Family History  Problem Relation Age  of Onset   Heart attack Mother    Stroke Mother    Liver cancer Mother    Diabetes Mother    Lung cancer Mother    Alcoholism Father    Sleep apnea Brother    Depression Brother    ADD / ADHD Brother    Diabetes Maternal Aunt    Diabetes Paternal Uncle    Colon cancer Paternal Uncle    Diabetes Maternal Grandmother    Dementia Maternal Grandmother    Family Psychiatric  History: See H&P Social History:  Social History   Substance and Sexual Activity  Alcohol Use Not Currently     Social History   Substance and Sexual Activity  Drug Use Not  Currently   Types: Marijuana   Comment: sober from from drugs     Social History   Socioeconomic History   Marital status: Single    Spouse name: Not on file   Number of children: 1   Years of education: 12   Highest education level: 12th grade  Occupational History   Not on file  Tobacco Use   Smoking status: Every Day    Packs/day: 0.50    Years: 26.00    Pack years: 13.00    Types: Cigarettes   Smokeless tobacco: Never  Vaping Use   Vaping Use: Former  Substance and Sexual Activity   Alcohol use: Not Currently   Drug use: Not Currently    Types: Marijuana    Comment: sober from from drugs    Sexual activity: Not Currently    Birth control/protection: None  Other Topics Concern   Not on file  Social History Narrative   ** Merged History Encounter **       Lives with her fiance, friend, and fiance's brother   Right handed   Social Determinants of Health   Financial Resource Strain: Not on file  Food Insecurity: Not on file  Transportation Needs: Not on file  Physical Activity: Not on file  Stress: Not on file  Social Connections: Not on file    Sleep: Fair  Appetite:  Good  Current Medications: Current Facility-Administered Medications  Medication Dose Route Frequency Provider Last Rate Last Admin   acetaminophen (TYLENOL) tablet 650 mg  650 mg Oral Q6H PRN Lucky Rathke, FNP   650 mg at 07/27/21 2052   alum & mag hydroxide-simeth (MAALOX/MYLANTA) 200-200-20 MG/5ML suspension 30 mL  30 mL Oral Q4H PRN Lucky Rathke, FNP       ARIPiprazole (ABILIFY) tablet 20 mg  20 mg Oral Daily Massengill, Nathan, MD   20 mg at 07/31/21 0737   FLUoxetine (PROZAC) capsule 20 mg  20 mg Oral Daily Lucky Rathke, FNP   20 mg at 07/31/21 0737   hydrOXYzine (ATARAX) tablet 25 mg  25 mg Oral TID PRN Lucky Rathke, FNP   25 mg at 07/30/21 2105   magnesium hydroxide (MILK OF MAGNESIA) suspension 30 mL  30 mL Oral Daily PRN Lucky Rathke, FNP       nicotine (NICODERM CQ - dosed in  mg/24 hours) patch 14 mg  14 mg Transdermal Daily Nelda Marseille, Sharnae Winfree E, MD   14 mg at 07/31/21 0737   nitrofurantoin (macrocrystal-monohydrate) (MACROBID) capsule 100 mg  100 mg Oral Q12H Rankin, Shuvon B, NP   100 mg at 07/31/21 0737   traZODone (DESYREL) tablet 150 mg  150 mg Oral QHS Damita Dunnings B, MD   150 mg at 07/30/21 2105  Lab Results: No results found for this or any previous visit (from the past 48 hour(s)).  Blood Alcohol level:  Lab Results  Component Value Date   ETH <10 07/26/2021   ETH <10 58/52/7782    Metabolic Disorder Labs: Lab Results  Component Value Date   HGBA1C 5.2 07/26/2021   MPG 102.54 07/26/2021   MPG 111 12/03/2020   Lab Results  Component Value Date   PROLACTIN 39.6 (H) 07/26/2021   PROLACTIN 36.9 (H) 11/11/2018   Lab Results  Component Value Date   CHOL 161 07/26/2021   TRIG 116 07/26/2021   HDL 36 (L) 07/26/2021   CHOLHDL 4.5 07/26/2021   VLDL 23 07/26/2021   LDLCALC 102 (H) 07/26/2021   LDLCALC 108 (H) 12/03/2020    Physical Findings: AIMS: Facial and Oral Movements Muscles of Facial Expression: None, normal Lips and Perioral Area: None, normal Jaw: None, normal Tongue: None, normal,Extremity Movements Upper (arms, wrists, hands, fingers): None, normal Lower (legs, knees, ankles, toes): None, normal, Trunk Movements Neck, shoulders, hips: None, normal, Overall Severity Severity of abnormal movements (highest score from questions above): None, normal Incapacitation due to abnormal movements: None, normal Patient's awareness of abnormal movements (rate only patient's report): No Awareness, Dental Status Current problems with teeth and/or dentures?: No Does patient usually wear dentures?: No   Musculoskeletal: Strength & Muscle Tone: within normal limits Gait & Station: normal Patient leans: N/A  Psychiatric Specialty Exam:  Presentation  General Appearance: Appropriate for Environment  Eye Contact:Good  Speech:Clear and  Coherent  Speech Volume:Normal  Handedness:Right   Mood and Affect  Mood:Euthymic  Affect:Appropriate   Thought Process  Thought Processes:Coherent  Descriptions of Associations:Intact  Orientation:Full (Time, Place and Person)  Thought Content:Logical  Hallucinations:Hallucinations: None  Ideas of Reference:None  Suicidal Thoughts:Suicidal Thoughts: No  Homicidal Thoughts:Homicidal Thoughts: No   Sensorium  Memory:Immediate Good; Recent Good; Remote Good  Judgment:Fair  Insight:Fair   Executive Functions  Concentration:Good  Attention Span:Good  [place] of Knowledge:Good  Language:Good   Psychomotor Activity  Psychomotor Activity:Psychomotor Activity: Normal   Assets  Assets:Communication Skills; Housing; Data processing manager; Resilience   Sleep  Time unrecorded   Physical Exam Constitutional:      Appearance: Normal appearance.  HENT:     Head: Normocephalic and atraumatic.  Pulmonary:     Effort: Pulmonary effort is normal.  Neurological:     General: No focal deficit present.     Mental Status: He is alert.   Review of Systems  Respiratory:  Negative for shortness of breath.   Cardiovascular:  Negative for chest pain.  Gastrointestinal:  Negative for constipation, diarrhea, nausea and vomiting.  Psychiatric/Behavioral:  Negative for depression, hallucinations and suicidal ideas.   Blood pressure (!) 146/80, pulse 86, temperature 98.1 F (36.7 C), temperature source Oral, resp. rate 18, height 5\' 7"  (1.702 m), weight 79.4 kg, SpO2 100 %. Body mass index is 27.41 kg/m.   Treatment Plan Summary: Daily contact with patient to assess and evaluate symptoms and progress in treatment and Medication management Amanda [name] is a 41 yo transgender FTM patient who presented endorsing symptoms of psychosis.  Patient is denying AVH.  Patient judgment and insight appear to have significantly improved on current medication regimen.  Patient also  has denies symptoms of PTSD exacerbation and insomnia.  Labs Reviewed: UDS (+ THC), U preg (-), CBC- WBC 14.2, Hgb 15.5, CMP- T protein 6.3, HgbA1c- WNL, Mg- WNL, Etoh- (-), Lipid panel- HDL-36, TSH- WNL, prolactin- WNL  EKG- QTC 424   Schizoaffective disorder, bipolar type PTSD OCD - Continue home Prozac 20mg  - Continue home Abilify 20mg  - Continue Trazodone to 150mg  QHS   Leukocytosis -Repeat  WBC 14.5  - UA improved with rare bacteria, trace Hgb, small leukocytes - will send for urine culture -UCx (pending), prophylactic nitrofurantoin 100 mg twice daily, will discontinue  if negative  PRN -Tylenol 650mg  q6h, pain -Maalox 5ml q4h, indigestion -Atarax 25mg  TID, anxiety -Milk of Mag 45mL, constipation   Safety: At this time patient appears to be of low risk of harm to self or others. Recommend q15 min checks.     PGY-2 Freida Busman, MD 07/31/2021, 4:37 PM

## 2021-07-31 NOTE — Group Note (Signed)
[facility] LCSW Group Therapy Note   Group Date: 07/31/2021 Start Time: 1300 End Time: [place] LCSW Group Therapy Type of Therapy:  Group Therapy: Cognitive Distortions  Participation Level:  Active  Modes of Intervention: Clarification, Confrontation, Discussion, Education, Exploration, Limit-setting, Orientation, Problem-solving, [place] Building, Art therapist, Socialization and Support  Summary of Progress/Problems: The topic for group today was emotional regulation. This group focused on both positive and negative emotion identification and allowed group members to process ways to identify feelings, regulate negative emotions, and find healthy ways to manage internal/external emotions. Group members were asked to reflect on a time when their reaction to an emotion led to a negative outcome and explored how alternative responses using emotion regulation would have benefited them. Group members were also asked to discuss a time when emotion regulation was utilized when a negative emotion was experienced. Pt shared his name during introductions and stated that he feels excited because his brother is coming to visit him tonight.   Mliss Fritz, LCSWA

## 2021-07-31 NOTE — Progress Notes (Signed)
Patient did attend the evening speaker AA meeting.  

## 2021-08-01 MED ORDER — NICOTINE 14 MG/24HR TD PT24: 14.0000 mg | MEDICATED_PATCH | Freq: Every day | TRANSDERMAL | 0 refills | Status: DC

## 2021-08-01 MED ORDER — TRAZODONE HCL 150 MG PO TABS: 150.0000 mg | ORAL_TABLET | Freq: Every day | ORAL | 0 refills | Status: DC

## 2021-08-01 MED ORDER — ARIPIPRAZOLE 20 MG PO TABS: 20.0000 mg | ORAL_TABLET | Freq: Every day | ORAL | 0 refills | Status: DC

## 2021-08-01 NOTE — Plan of Care (Signed)
Nurse discussed coping skills with patient.  

## 2021-08-01 NOTE — Plan of Care (Signed)
°  Problem: Education: °Goal: Emotional status will improve °Outcome: Progressing °Goal: Mental status will improve °Outcome: Progressing °Goal: Verbalization of understanding the information provided will improve °Outcome: Progressing °  °

## 2021-08-01 NOTE — Progress Notes (Addendum)
Patient denied anxiety, depression and hopeless.  Sleeps good, good appetite, normal energy level, good concentration.  Denied withdrawals.  Denied physical problems.  Denied physical pain.  Goal is discharge.

## 2021-08-01 NOTE — Progress Notes (Addendum)
Pt attended orientation /goals group. Presents with logical speech, congruent affect and  pleasant mood. Pt forwards in communication and engaged in activities.  Goal: Coping skills related to my A/V hallucinations and realistic nightmares. "I'm happy about my medications adjustment and plan to attend DBT therapy when discharge".

## 2021-08-01 NOTE — Progress Notes (Signed)
   Pt presents with no complaints.  Pt does appear to be mildly anxious.  Pt's excitement increases when talking about his son.  Pt is excited about getting more unsupervised time with his son.  Pt anticipate discharge 08/01/21.  Pt denies SI/HI and verbally contracts for safety.  Pt denies AVH.  Medication administered per [facility] per pt request.  Pt is safe on the unit with Q 15 minute safety checks.   07/31/21 2133  Psych Admission Type (Psych Patients Only)  Admission Status Voluntary  Psychosocial Assessment  Patient Complaints None  Eye Contact Fair  Facial Expression Animated  Affect Anxious;Depressed  Speech Logical/coherent  Interaction Assertive  Motor Activity Slow  Appearance/Hygiene Unremarkable  Behavior Characteristics Cooperative  Mood Depressed;Anxious  Thought Process  Coherency WDL  Content WDL  Delusions WDL  Perception Hallucinations  Hallucination None reported or observed  Judgment Poor  Confusion None  Danger to Self  Current suicidal ideation? Denies  Self-Injurious Behavior No self-injurious ideation or behavior indicators observed or expressed   Agreement Not to Harm Self Yes  Description of Agreement verbal  Danger to Others  Danger to Others None reported or observed

## 2021-08-01 NOTE — Group Note (Deleted)
Recreation Therapy Group Note   Group Topic:Animal Assisted Therapy   Group Date: 08/01/2021 Start Time: 1430 End Time: 1500 Facilitators: Vaniya Augspurger, Bjorn Loser, LRT Location: [address]   Animal-Assisted Therapy (AAT) Program Checklist/Progress Notes Patient Eligibility Criteria Checklist & Daily Group note for Rec Tx Intervention   AAA/T Program Assumption of Risk Form signed by Patient/ or Parent Legal Guardian YES  Patient is free of allergies or severe asthma  YES  Patient reports no fear of animals YES  Patient reports no history of cruelty to animals YES  Patient understands their participation is voluntary YES  Patient washes hands before animal contact YES  Patient washes hands after animal contact YES    Group Description: Patients provided opportunity to interact with trained and credentialed [employer] [facility] dog and the community volunteer/dog handler. Patients practiced appropriate animal interaction and were educated on dog safety outside of the hospital in common community settings. Patients were allowed to use dog toys and other items to practice commands, engage the dog in play, and/or complete routine aspects of animal care. Patients participated with turn taking and structure in place as needed based on number of participants and quality of spontaneous participation delivered.  Goal Area(s) Addresses:  Patient will demonstrate appropriate social skills during group session.  Patient will demonstrate ability to follow instructions during group session.  Patient will identify reduction in stress level due to participation in animal assisted therapy session.    Education: Contractor, Pensions consultant, Communication & Social Skills        Affect/Mood: {RT BHH Affect/Mood:26271}   Participation Level: {RT BHH Participation [employer]   Participation Quality: {RT BHH Participation Quality:26268}   Behavior: {RT BHH Group  Behavior:26269}   Speech/Thought Process: {RT BHH Speech/Thought:26276}   Insight: {RT BHH Insight:26272}   Judgement: {RT BHH Judgement:26278}   Modes of Intervention: {RT BHH Modes of Intervention:26277}   Patient Response to Interventions:  {RT BHH Patient Response to Intervention:26274}   Education Outcome:  {RT [place] Education Outcome:26279}   Clinical Observations/Individualized Feedback: *** was *** in their participation of session activities and group discussion. Pt identified ***   Plan: {RT BHH Tx HBZJ:69678}   Bjorn Loser Antonieta Slaven, LRT,  08/01/2021 4:01 PM

## 2021-08-01 NOTE — Progress Notes (Signed)
Discharge Note:  Patient discharged with bus tickets.  Suicide prevention information given and discussed with patient who stated he understood and had no questions.  Patient denied SI and HI.  Denied A/V hallucinations.  Patient stated he received all his belongings, clothing, toiletries, misc items, etc.  Patient stated he appreciated all assistance received from [facility] staff.  All required information given at discharge.
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
## 2021-08-01 NOTE — Progress Notes (Signed)
°  [facility] Adult Case Management Discharge Plan :  Will you be returning to the same living situation after discharge:  Yes,  personal apartment At discharge, do you have transportation home?: Yes,  [place]. CSW provided pt with 2 bus passes.  Do you have the ability to pay for your medications: Yes,  Medicare  Release of information consent forms completed and in the chart;  Patient's signature needed at discharge.  Patient to Follow up at:  [place]. Go on 08/07/2021.   Specialty: Behavioral Health Why: You have an appointment for therapy services on 08/07/21 at 10:00 am.  You also have an appointment for medication management services on  08/16/21 at 8:30 am. These appointments will be held in person. Contact information: [place] [address] [place]. Go on 10/02/2021.   Why: You have an appointment to establish care with this provider for primary care services on  10/02/20 at 1:30 pm.  This appointment will be held in person.  Please bring your insurance card, photo ID, and medications. Contact information: [address] [place] [address] [phone]                Next level of care provider has access to [place] and Suicide Prevention discussed: Yes,  Garlan Fair (brother) [phone]     Has patient been referred to the Quitline?: Patient refused referral  Patient has been referred for addiction treatment: N/A  Eliott Nine 08/01/2021, 9:54 AM

## 2021-08-01 NOTE — Discharge Summary (Signed)
Physician Discharge Summary Note  Patient:  Amanda [name] is an 40 y.o., adult MRN:  [id number] DOB:  11/27/1980 Patient phone:  [phone] (home)  Patient address:   [address] [place] [address],  Total Time spent with patient: 15 minutes  Date of Admission:  07/27/2021 Date of Discharge: 08/01/2021  Reason for Admission:   Worsening AVH, decreased sleep, worsening depression, and suicidal thoughts and cutting himself for the last 2 days prior to ED evaluation.   Principal Problem: Schizoaffective disorder, bipolar type [facility]) Discharge Diagnoses: Principal Problem:   Schizoaffective disorder, bipolar type ([place]) Active Problems:   Borderline personality disorder ([place])   PTSD (post-traumatic stress disorder)   OCD (obsessive compulsive disorder)   Past Psychiatric History: Patient endorses multiple past inpatient psych hospitalization, and is not sure of the number. Patient endorses a hx of of these occurring in [place] and her last hosp at [facility] was 11/2020. Patient endorses hx of 100+ SA.  Patient endorses PPH of Bipolar disorder, PTSD, OCD, Bipolar 1 disorder, Insomnia, Schizophrenia, Schizoaffective disorder, depression, and anxiety. Patient reports belief that she has been on numerous medications but does not recall names. Patient endorses belief that she was on Topomax, Seroquel (oversedated), and possibly Depakote in the past. Patient currently see's Dr. Pricilla Larsson and Monna Fam for med mgmt and counseling. Patient reports being complaint with current regimen Abilify 15mg , Prozac 20mg , and Trazodone 100mg  QHS.      Past Medical History:  Past Medical History:  Diagnosis Date   Anxiety    Asthma    Asthma due to environmental allergies    Asthma due to seasonal allergies    Bipolar 1 disorder (HCC)    Borderline personality disorder ([place])    Brain bleed ([place])    Chronic post-traumatic stress disorder (PTSD)    complex chronic with  psychotic features and self harm behaviors   Complication of anesthesia    Constipation    Dander (animal) allergy    Hearing loss    right ear   Heart murmur    denies seeing a cardiologist   Hip dysplasia    Hypertension    Gestional    Major depression, chronic    Mood disorder (HCC)    OCD (obsessive compulsive disorder)    Pneumonia    PONV (postoperative nausea and vomiting)    PTSD (post-traumatic stress disorder)    RA (rheumatoid arthritis) ([place])    Rheumatoid arthritis ([place])    Schizophrenia ([place])    "I think that is wrong"   Seizure disorder ([place])    Suicidal ideations    Suicide attempt [facility])     Past Surgical History:  Procedure Laterality Date   Brain Shunt  1981   "a few hours old"   EYE SURGERY Bilateral [place] (IUD) INSERTION N/A 09/16/2018   Procedure: INTRAUTERINE DEVICE (IUD) INSERTION;  Surgeon: Lavonia Drafts, MD;  Location: [facility];  Service: Gynecology;  Laterality: N/A;   PERINEUM REPAIR N/A 09/16/2018   Procedure: EPISIOTOMY REVISION;  Surgeon: Lavonia Drafts, MD;  Location: [facility];  Service: Gynecology;  Laterality: N/A;   SHUNT REMOVAL  1983   Tooth Removal     multiple   Family History:  Family History  Problem Relation Age of Onset   Heart attack Mother    Stroke Mother    Liver cancer Mother    Diabetes Mother    Lung cancer Mother    Alcoholism  Father    Sleep apnea Brother    Depression Brother    ADD / ADHD Brother    Diabetes Maternal Aunt    Diabetes Paternal Uncle    Colon cancer Paternal Uncle    Diabetes Maternal Grandmother    Dementia Maternal Grandmother    Family Psychiatric  History: Denies Social History:  Social History   Substance and Sexual Activity  Alcohol Use Not Currently     Social History   Substance and Sexual Activity  Drug Use Not Currently   Types: Marijuana   Comment: sober from from drugs     Social History   Socioeconomic History   Marital status:  Single    Spouse name: Not on file   Number of children: 1   Years of education: 12   Highest education level: 12th grade  Occupational History   Not on file  Tobacco Use   Smoking status: Every Day    Packs/day: 0.50    Years: 26.00    Pack years: 13.00    Types: Cigarettes   Smokeless tobacco: Never  Vaping Use   Vaping Use: Former  Substance and Sexual Activity   Alcohol use: Not Currently   Drug use: Not Currently    Types: Marijuana    Comment: sober from from drugs    Sexual activity: Not Currently    Birth control/protection: None  Other Topics Concern   Not on file  Social History Narrative   ** Merged History Encounter **       Lives with her fiance, friend, and fiance's brother   Right handed   Social Determinants of Health   Financial Resource Strain: Not on file  Food Insecurity: Not on file  Transportation Needs: Not on file  Physical Activity: Not on file  Stress: Not on file  Social Connections: Not on file    Hospital Course:  Amanda "[name]" [name] is a 40 yo FTM patient who presented endorsing exacerbation of schizoaffective disorder symptoms 2/2 recent stressors. Patient endorsed that he was having AVH, insomnia, and had recently been feeling very stressed by interaction with his father regarding his attempts at reunification with his own 41 year old. Patient's medication was adjusted with slight increase in Abilify and Trazodone. Patient improved with these adjustments noting decreased anxiety, resolution in AVH, and insomnia.   Patient was noted to attend groups, interact on the mileu well and had no behavior episodes requiring PRN medication throughout his stay. Patient's brother provided support through phone calls and patient endorsed that his support system remained strong and that he had learned during his stay that he would need to create better boundaries with his father. Patient endorsed intention to be compliant with outpatient medications. Patient  denied SI,HI, and AVH at time of discharge. Patient also denied any adverse side effects from medication adjustments.  Physical Findings: AIMS: Facial and Oral Movements Muscles of Facial Expression: None, normal Lips and Perioral Area: None, normal Jaw: None, normal Tongue: None, normal,Extremity Movements Upper (arms, wrists, hands, fingers): None, normal Lower (legs, knees, ankles, toes): None, normal, Trunk Movements Neck, shoulders, hips: None, normal, Overall Severity Severity of abnormal movements (highest score from questions above): None, normal Incapacitation due to abnormal movements: None, normal Patient's awareness of abnormal movements (rate only patient's report): No Awareness, Dental Status Current problems with teeth and/or dentures?: No Does patient usually wear dentures?: No  CIWA:    COWS:     Musculoskeletal: Strength & Muscle Tone: within normal  limits Gait & Station: normal Patient leans: N/A   Psychiatric Specialty Exam:  Presentation  General Appearance: Appropriate for Environment  Eye Contact:Good  Speech:Clear and Coherent  Speech Volume:Normal  Handedness:Right   Mood and Affect  Mood:Euthymic  Affect:Congruent   Thought Process  Thought Processes:Coherent  Descriptions of Associations:Intact  Orientation:Full (Time, Place and Person)  Thought Content:Logical  History of Schizophrenia/Schizoaffective disorder:No data recorded Duration of Psychotic Symptoms:Less than six months  Hallucinations:Hallucinations: None  Ideas of Reference:None  Suicidal Thoughts:Suicidal Thoughts: No  Homicidal Thoughts:Homicidal Thoughts: No   Sensorium  Memory:Immediate Good; Recent Good  Judgment:Fair  Insight:Fair   Executive Functions  Concentration:Fair  Attention Span:Good  [place] of Knowledge:Good  Language:Good   Psychomotor Activity  Psychomotor Activity:Psychomotor Activity: Normal   Assets   Assets:Communication Skills; Housing; Data processing manager; Resilience   Sleep  Sleep:Sleep: Good    Physical Exam: Physical Exam Constitutional:      Appearance: Normal appearance.  HENT:     Head: Normocephalic and atraumatic.  Eyes:     Extraocular Movements: Extraocular movements intact.  Pulmonary:     Effort: Pulmonary effort is normal.  Neurological:     Mental Status: He is alert and oriented to person, place, and time.   Review of Systems  Psychiatric/Behavioral:  Negative for hallucinations and suicidal ideas. The patient does not have insomnia.   Blood pressure 107/78, pulse (!) 101, temperature 97.9 F (36.6 C), temperature source Oral, resp. rate 18, height 5\' 7"  (1.702 m), weight 79.4 kg, SpO2 99 %. Body mass index is 27.41 kg/m.   Social History   Tobacco Use  Smoking Status Every Day   Packs/day: 0.50   Years: 26.00   Pack years: 13.00   Types: Cigarettes  Smokeless Tobacco Never   Tobacco Cessation:  A prescription for an FDA-approved tobacco cessation medication provided at discharge   Blood Alcohol level:  Lab Results  Component Value Date   [facility] <10 07/26/2021   ETH <10 96/78/9381    Metabolic Disorder Labs:  Lab Results  Component Value Date   HGBA1C 5.2 07/26/2021   MPG 102.54 07/26/2021   MPG 111 12/03/2020   Lab Results  Component Value Date   PROLACTIN 39.6 (H) 07/26/2021   PROLACTIN 36.9 (H) 11/11/2018   Lab Results  Component Value Date   CHOL 161 07/26/2021   TRIG 116 07/26/2021   HDL 36 (L) 07/26/2021   CHOLHDL 4.5 07/26/2021   VLDL 23 07/26/2021   LDLCALC 102 (H) 07/26/2021   LDLCALC 108 (H) 12/03/2020    See Psychiatric Specialty Exam and Suicide Risk Assessment completed by Attending Physician prior to discharge.  Discharge destination:  Home  Is patient on multiple antipsychotic therapies at discharge:  No   Has Patient had three or more failed trials of antipsychotic monotherapy by history:  No  Recommended  Plan for Multiple Antipsychotic Therapies: NA   Allergies as of 08/01/2021       Reactions   Aspartame And Phenylalanine Anaphylaxis, Hives, Diarrhea, Other (See Comments)   Artifical sweetners - diarrhea   Benadryl [diphenhydramine] Anaphylaxis, Diarrhea, Other (See Comments)   Blisters   Other Anaphylaxis, Nausea And Vomiting, Rash, Other (See Comments)   Aspartame- Blisters Dust- Worsens asthma Ragweed- Worsens asthma, face gets red, and sneezing Animal Fur/Dander- Worsens asthma and sneezing   Scallops [shellfish Allergy] Anaphylaxis, Diarrhea, Nausea And Vomiting   Yellow Jacket Venom [bee Venom] Anaphylaxis, Diarrhea, Nausea And Vomiting   Seizures and numbness   Pollen  Extract Other (See Comments)   Runny nose, eyes, and asthma worsens   Tegretol [carbamazepine] Hives, Diarrhea, Other (See Comments)   Blisters in mouth and increase in seizures   Adhesive [tape] Rash   Latex Hives, Rash   Blisters, also- Condoms and dental encounters        Medication List     STOP taking these medications    Iron 325 (65 Fe) MG Tabs   multivitamin with minerals Tabs tablet       TAKE these medications      Indication  ARIPiprazole 20 MG tablet Commonly known as: ABILIFY Take 1 tablet (20 mg total) by mouth daily. Start taking on: August 02, 2021 What changed:  medication strength how much to take  Indication: Schizoaffective   FLUoxetine 20 MG capsule Commonly known as: PROZAC Take 1 capsule (20 mg total) by mouth daily.  Indication: Schizoaffective disorder, bipolar type   hydrOXYzine 25 MG tablet Commonly known as: ATARAX Take 1 tablet (25 mg total) by mouth 3 (three) times daily as needed for anxiety.  Indication: Feeling Anxious   nicotine 14 mg/24hr patch Commonly known as: NICODERM CQ - dosed in mg/24 hours Place 1 patch (14 mg total) onto the skin daily. Start taking on: August 02, 2021  Indication: Nicotine Addiction   traZODone 150 MG  tablet Commonly known as: DESYREL Take 1 tablet (150 mg total) by mouth at bedtime. What changed:  medication strength how much to take  Indication: [place]. Go on 08/07/2021.   Specialty: Behavioral Health Why: You have an appointment for therapy services on 08/07/21 at 10:00 am.  You also have an appointment for medication management services on  08/16/21 at 8:30 am. These appointments will be held in person. Contact information: [place] [address] [place]. Go on 10/02/2021.   Why: You have an appointment to establish care with this provider for primary care services on  10/02/20 at 1:30 pm.  This appointment will be held in person.  Please bring your insurance card, photo ID, and medications. Contact information: [address] [place] [address] [phone]                Follow-up recommendations:  Follow up recommendations: - Activity as tolerated. - Diet as recommended by PCP. - Keep all scheduled follow-up appointments as recommended.   Comments:  Patient is instructed to take all prescribed medications as recommended. Report any side effects or adverse reactions to your outpatient psychiatrist. Patient is instructed to abstain from alcohol and illegal drugs while on prescription medications. In the event of worsening symptoms, patient is instructed to call the crisis hotline, 911, or go to the nearest emergency department for evaluation and treatment.    Signed:   PGY-2 Freida Busman, MD 08/01/2021, 12:23 PM

## 2021-08-04 ENCOUNTER — Telehealth (HOSPITAL_COMMUNITY): Payer: Self-pay | Admitting: Family Medicine

## 2021-08-04 NOTE — BH Assessment (Signed)
Care Management - [place] Follow Up Discharges   Patient has been placed in an inpatient psychiatric hospital ([facility]) on 07-26-21.

## 2021-08-07 ENCOUNTER — Ambulatory Visit (INDEPENDENT_AMBULATORY_CARE_PROVIDER_SITE_OTHER): Payer: Medicare (Managed Care) | Admitting: Licensed Clinical Social Worker

## 2021-08-07 ENCOUNTER — Other Ambulatory Visit: Payer: Self-pay

## 2021-08-07 DIAGNOSIS — F251 Schizoaffective disorder, depressive type: Secondary | ICD-10-CM

## 2021-08-07 NOTE — Progress Notes (Signed)
° °  THERAPIST PROGRESS NOTE   Participation Level: Active  Behavioral Response: CasualAlertAnxious and Depressed  Type of Therapy: Individual Therapy  Treatment Goals addressed: Anger, Anxiety, and Coping  Interventions: CBT, Solution Focused, and Supportive  Summary: Amanda [name] is a 40 y.o. adult who presents with euthymic mood\affect.  Patient was pleasant, cooperative, and maintained good eye contact.  He engaged well in therapy session and was dressed casually.  Primary stressor for today's illness, legal, and family conflict.  Last session patient and LCSW went downstairs to the behavioral health urgent care for further evaluations for reported self-harm.  Patient was admitted to the behavioral health hospital at [facility] and was there for  6 days.  Amanda [name] reports that he is feeling "much better" since hospitalization.  He reports an increase in the amount of sleep that he is having and a decrease in self-harm feelings.  Other stressors for patient are family conflict.  One of the triggers that patient reports for her self-harm was feeling overwhelmed by her biological father's overbearing attitude to how she is attempting to raise her son.  Patient reports that he is attempted to set boundaries in the past however when patient attempts to give consequences for crossing of boundaries other family members get involved and patient "caves" and wants her father back in.  Patient reports that she is still attempting to get full custody of her son and is continuing to work with [employer].  Suicidal/Homicidal: Yeswithout intent/plan  Therapist Response:     Intervention/Plan: Interventions utilized in today's session were supportive therapy, person centered therapy, and cognitive behavioral therapy.  LCSW educated patient today on cognitive distortions for "all or nothing attitudes".  LCSW and patient also spoke about coping skills utilized for stress management.  Patient  reports painting, coloring, listening to classical music, and writing.  Patient to utilize the skills at least 1 time per day.  Patient and LCSW went over setting boundaries with family members and patient to continue to set boundaries with her father to avoid self harming triggers.  Plan for patient is to follow-up in 4 weeks with LCSW.  LCSW also sent a referral over to [facility] for support groups patient to follow-up 1 time in the next 4 weeks   Plan: Return again in 4 weeks.      [name], LCSW 08/07/2021

## 2021-08-16 ENCOUNTER — Encounter (HOSPITAL_COMMUNITY): Payer: Self-pay | Admitting: Psychiatry

## 2021-08-16 ENCOUNTER — Telehealth (INDEPENDENT_AMBULATORY_CARE_PROVIDER_SITE_OTHER): Payer: Medicaid - Out of State | Admitting: Psychiatry

## 2021-08-16 ENCOUNTER — Other Ambulatory Visit: Payer: Self-pay

## 2021-08-16 DIAGNOSIS — F17200 Nicotine dependence, unspecified, uncomplicated: Secondary | ICD-10-CM

## 2021-08-16 DIAGNOSIS — F411 Generalized anxiety disorder: Secondary | ICD-10-CM

## 2021-08-16 DIAGNOSIS — F251 Schizoaffective disorder, depressive type: Secondary | ICD-10-CM | POA: Diagnosis not present

## 2021-08-16 MED ORDER — HYDROXYZINE HCL 25 MG PO TABS
25.0000 mg | ORAL_TABLET | Freq: Three times a day (TID) | ORAL | 3 refills | Status: DC | PRN
  Filled 2021-08-16 – 2021-09-29 (×2): qty 90, 30d supply, fill #0
  Filled 2021-09-29: qty 90, 30d supply, fill #1

## 2021-08-16 MED ORDER — TRAZODONE HCL 150 MG PO TABS
150.0000 mg | ORAL_TABLET | Freq: Every day | ORAL | 3 refills | Status: DC
  Filled 2021-08-16 – 2021-09-29 (×2): qty 30, 30d supply, fill #0
  Filled 2021-09-29: qty 30, 30d supply, fill #1

## 2021-08-16 MED ORDER — NICOTINE 14 MG/24HR TD PT24
14.0000 mg | MEDICATED_PATCH | Freq: Every day | TRANSDERMAL | 3 refills | Status: DC
  Filled 2021-08-16: qty 28, 28d supply, fill #0

## 2021-08-16 MED ORDER — FLUOXETINE HCL 20 MG PO CAPS
20.0000 mg | ORAL_CAPSULE | Freq: Every day | ORAL | 3 refills | Status: DC
  Filled 2021-08-16: qty 30, 30d supply, fill #0
  Filled 2021-09-29: qty 30, 30d supply, fill #1
  Filled 2021-09-29: qty 30, 30d supply, fill #0

## 2021-08-16 MED ORDER — ARIPIPRAZOLE 20 MG PO TABS
20.0000 mg | ORAL_TABLET | Freq: Every day | ORAL | 3 refills | Status: DC
  Filled 2021-08-16 – 2021-09-29 (×2): qty 30, 30d supply, fill #0
  Filled 2021-09-29: qty 30, 30d supply, fill #1
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
## 2021-08-23 ENCOUNTER — Other Ambulatory Visit: Payer: Self-pay

## 2021-08-23 ENCOUNTER — Ambulatory Visit (INDEPENDENT_AMBULATORY_CARE_PROVIDER_SITE_OTHER): Payer: Medicare (Managed Care) | Admitting: Licensed Clinical Social Worker

## 2021-08-23 DIAGNOSIS — F251 Schizoaffective disorder, depressive type: Secondary | ICD-10-CM

## 2021-08-23 NOTE — Progress Notes (Signed)
° °  THERAPIST PROGRESS NOTE  Session Time: 56  Participation Level: Active  Behavioral Response: CasualAlertAnxious and Depressed  Type of Therapy: Individual Therapy  Treatment Goals addressed: Anxiety, Coping, and Diagnosis: schizoaffective disorder   Interventions: CBT, Motivational Interviewing, Strength-based, and Supportive  Summary: Amanda [name] is a 41 y.o. adult who presents with anxious mood\affect.  Patient was alert and oriented x5.  He was pleasant, cooperative, and maintained good eye contact.  Patient was dressed casually and engaged well in therapy session.  Patient's primary stressor remains getting custody of his son back.  Patient's son is currently in custody of [employer].  Patient has been following through on tasks presented to him for [employer] such as parenting classes, therapy, and obtaining housing.  Patient reports that [employer] is now concerned with "mental stability".  This is due to patient having 2 hospitalizations in the last 9 months.  Patient reports that the [employer] stated that if mental stability was not adequate for renal unification they would recommend adoption.  Amanda [name] states that the case Freight forwarder for [employer] was asking about triggers and this was upsetting to [name] because of all the hard work he has put the last 18 months.  Suicidal/Homicidal: Nowithout intent/plan  Therapist Response:    Intervention/Plan: LCSW utilized cognitive behavioral therapy, supportive therapy, and person centered therapy in today's session.  LCSW utilized language for genuineness praise, and encouragement.  LCSW educated patient on signs and symptoms for triggers for suicidal ideations such as patient's "father".  Plan for patient for next time is to come up with a pros\cons list of continuing to fight for fall reunification with her son PJ or having a open adoption with  sons current foster parents  Plan: Return again in 4    weeks.     [name], LCSW 08/23/2021

## 2021-09-20 ENCOUNTER — Other Ambulatory Visit: Payer: Self-pay

## 2021-09-20 ENCOUNTER — Ambulatory Visit (INDEPENDENT_AMBULATORY_CARE_PROVIDER_SITE_OTHER): Payer: Medicare (Managed Care) | Admitting: Licensed Clinical Social Worker

## 2021-09-20 DIAGNOSIS — F251 Schizoaffective disorder, depressive type: Secondary | ICD-10-CM

## 2021-09-20 NOTE — Progress Notes (Signed)
° °  THERAPIST PROGRESS NOTE  Session Time: 21  Participation Level: Active  Behavioral Response: CasualAlertAnxious and Depressed  Type of Therapy: Individual Therapy  Treatment Goals addressed: Anxiety and Diagnosis: schizoaffective disorder   Interventions: CBT, Motivational Interviewing, and Solution Focused  Summary: Amanda [name] is a 41 y.o. adult who presents with patient is alert and oriented x5.  Patient presented as pleasant, cooperative, maintains good eye contact.  He engaged well in therapy session and was dressed casually.  He presented today with anxious and depressed mood\affect.  LCSW spoke with patient today about an email sent from [employer] case manager that is overseeing Amanda [name]'s case for custody of his son PJ.  LCSW went over the email that he planned on sending back to the case manager and patient was in agreement to email being sent.  LCSW and patient spoke about goals and objectives not yet met and the reason that therapy could not be signed off on at this time.  Amanda [name] was agreeable and pleasant during this discussion.  LCSW asked patient to write a pros\cons list of the reasons that he wanted to have PJ full custody and reasons why he thought it would be a bad idea.  LCSW and patient discussed those pros and cons in session.  And goals and objectives that came from reading a list.  Patient reported things that he would like to work on her boundaries with his family, Disassociation and nightmares.  Patient reports that he would like to increase boundaries with his family's as well as decrease nightmares\disassociation in the next several months.    Suicidal/Homicidal: Nowithout intent/plan  Therapist Response:     Intervention/Plan: Interventions utilized in today's session was motivational interviewing and cognitive behavioral health therapy.  LCSW utilizes open-ended questions, positive affirmations, and reflective listening in today's session.  Marland Kitchen   LCSW utilized language for praise, encouragement and empowerment.  Patient to continue utilizing journaling at least 4 times per week, driving at least 2 times per week, and walking at least 3 times per week.  Plan: Return again in 4 weeks.      [name], LCSW 09/20/2021

## 2021-09-25 NOTE — Plan of Care (Signed)
Pt agreeable to plan  ?

## 2021-09-29 ENCOUNTER — Other Ambulatory Visit: Payer: Self-pay

## 2021-10-02 ENCOUNTER — Other Ambulatory Visit: Payer: Self-pay

## 2021-10-02 ENCOUNTER — Encounter: Payer: Self-pay | Admitting: Family Medicine

## 2021-10-02 ENCOUNTER — Ambulatory Visit: Payer: Medicare (Managed Care) | Attending: Family Medicine | Admitting: Family Medicine

## 2021-10-02 VITALS — BP 133/88 | HR 72 | Ht 67.0 in | Wt 189.8 lb

## 2021-10-02 DIAGNOSIS — Z1159 Encounter for screening for other viral diseases: Secondary | ICD-10-CM

## 2021-10-02 DIAGNOSIS — M069 Rheumatoid arthritis, unspecified: Secondary | ICD-10-CM

## 2021-10-02 DIAGNOSIS — F17200 Nicotine dependence, unspecified, uncomplicated: Secondary | ICD-10-CM

## 2021-10-02 DIAGNOSIS — F640 Transsexualism: Secondary | ICD-10-CM | POA: Diagnosis not present

## 2021-10-02 DIAGNOSIS — F250 Schizoaffective disorder, bipolar type: Secondary | ICD-10-CM

## 2021-10-02 DIAGNOSIS — H918X3 Other specified hearing loss, bilateral: Secondary | ICD-10-CM

## 2021-10-02 MED ORDER — BUPROPION HCL ER (XL) 150 MG PO TB24
150.0000 mg | ORAL_TABLET | Freq: Every day | ORAL | 3 refills | Status: DC
  Filled 2021-10-02: qty 30, 30d supply, fill #0

## 2021-10-02 NOTE — Progress Notes (Signed)
Would like to get hearing checked in left ear.  Pain in right leg.

## 2021-10-02 NOTE — Patient Instructions (Signed)
Managing the Challenge of Quitting Smoking ?Quitting smoking is a physical and mental challenge. You will face cravings, withdrawal symptoms, and temptation. Before quitting, work with your health care provider to make a plan that can help you manage quitting. Preparation can help you quit and keep you from giving in. ?How to manage lifestyle changes ?Managing stress ?Stress can make you want to smoke, and wanting to smoke may cause stress. It is important to find ways to manage your stress. You might try some of the following: ?Practice relaxation techniques. ?Breathe slowly and deeply, in through your nose and out through your mouth. ?Listen to music. ?Soak in a bath or take a shower. ?Imagine a peaceful place or vacation. ?Get some support. ?Talk with family or friends about your stress. ?Join a support group. ?Talk with a counselor or therapist. ?Get some physical activity. ?Go for a walk, run, or bike ride. ?Play a favorite sport. ?Practice yoga. ? ?Medicines ?Talk with your health care provider about medicines that might help you deal with cravings and make quitting easier for you. ?Relationships ?Social situations can be difficult when you are quitting smoking. To manage this, you can: ?Avoid parties and other social situations where people might be smoking. ?Avoid alcohol. ?Leave right away if you have the urge to smoke. ?Explain to your family and friends that you are quitting smoking. Ask for support and let them know you might be a bit grumpy. ?Plan activities where smoking is not an option. ?General instructions ?Be aware that many people gain weight after they quit smoking. However, not everyone does. To keep from gaining weight, have a plan in place before you quit and stick to the plan after you quit. Your plan should include: ?Having healthy snacks. When you have a craving, it may help to: ?Eat popcorn, carrots, celery, or other cut vegetables. ?Chew sugar-free gum. ?Changing how you eat. ?Eat small  portion sizes at meals. ?Eat 4-6 small meals throughout the day instead of 1-2 large meals a day. ?Be mindful when you eat. Do not watch television or do other things that might distract you as you eat. ?Exercising regularly. ?Make time to exercise each day. If you do not have time for a long workout, do short bouts of exercise for 5-10 minutes several times a day. ?Do some form of strengthening exercise, such as weight lifting. ?Do some exercise that gets your heart beating and causes you to breathe deeply, such as walking fast, running, swimming, or biking. This is very important. ?Drinking plenty of water or other low-calorie or no-calorie drinks. Drink 6-8 glasses of water daily. ? ?How to recognize withdrawal symptoms ?Your body and mind may experience discomfort as you try to get used to not having nicotine in your system. These effects are called withdrawal symptoms. They may include: ?Feeling hungrier than normal. ?Having trouble concentrating. ?Feeling irritable or restless. ?Having trouble sleeping. ?Feeling depressed. ?Craving a cigarette. ?To manage withdrawal symptoms: ?Avoid places, people, and activities that trigger your cravings. ?Remember why you want to quit. ?Get plenty of sleep. ?Avoid coffee and other caffeinated drinks. These may worsen some of your symptoms. ?These symptoms may surprise you. But be assured that they are normal to have when quitting smoking. ?How to manage cravings ?Come up with a plan for how to deal with your cravings. The plan should include the following: ?A definition of the specific situation you want to deal with. ?An alternative action you will take. ?A clear idea for how this action   will help. ?The name of someone who might help you with this. ?Cravings usually last for 5-10 minutes. Consider taking the following actions to help you with your plan to deal with cravings: ?Keep your mouth busy. ?Chew sugar-free gum. ?Suck on hard candies or a straw. ?Brush your  teeth. ?Keep your hands and body busy. ?Change to a different activity right away. ?Squeeze or play with a ball. ?Do an activity or a hobby, such as making bead jewelry, practicing needlepoint, or working with wood. ?Mix up your normal routine. ?Take a short exercise break. Go for a quick walk or run up and down stairs. ?Focus on doing something kind or helpful for someone else. ?Call a friend or family member to talk during a craving. ?Join a support group. ?Contact a quitline. ?Where to find support ?To get help or find a support group: ?Call the National Cancer Institute's Smoking Quitline: 1-800-QUIT NOW (784-8669) ?Visit the website of the Substance Abuse and Mental Health Services Administration: www.samhsa.gov ?Text QUIT to SmokefreeTXT: 478848 ?Where to find more information ?Visit these websites to find more information on quitting smoking: ?National Cancer Institute: www.smokefree.gov ?American Lung Association: www.lung.org ?American Cancer Society: www.cancer.org ?Centers for Disease Control and Prevention: www.cdc.gov ?American Heart Association: www.heart.org ?Contact a health care provider if: ?You want to change your plan for quitting. ?The medicines you are taking are not helping. ?Your eating feels out of control or you cannot sleep. ?Get help right away if: ?You feel depressed or become very anxious. ?Summary ?Quitting smoking is a physical and mental challenge. You will face cravings, withdrawal symptoms, and temptation to smoke again. Preparation can help you as you go through these challenges. ?Try different techniques to manage stress, handle social situations, and prevent weight gain. ?You can deal with cravings by keeping your mouth busy (such as by chewing gum), keeping your hands and body busy, calling family or friends, or contacting a quitline for people who want to quit smoking. ?You can deal with withdrawal symptoms by avoiding places where people smoke, getting plenty of rest, and  avoiding drinks with caffeine. ?This information is not intended to replace advice given to you by your health care provider. Make sure you discuss any questions you have with your health care provider. ?Document Revised: 04/14/2021 Document Reviewed: 05/26/2019 ?Elsevier Patient Education ? 2022 Elsevier Inc. ? ?

## 2021-10-02 NOTE — Progress Notes (Signed)
Subjective:  Patient ID: Amanda [name], adult    DOB: 04-May-1981  Age: 41 y.o. MRN: [id number]  CC: New Patient (Initial Visit)   HPI Amanda [name] is a 41 y.o. year old adult with a history of schizoaffective disorder, RA Transgender female  who presents today to establish care. Previously followed by Dr Deneise Lever but his insurance changed and he had to seek another primary care.  Interval History: He would like his L ear checked checked as his has noticed decreased hearing in the L ear for a couple of weeks. 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 When his friends are talking it sounds like they are whispering. He got hearing aids at [facility] but was seeing ENT while in [place] 3 years ago.  History of RA in 1988 and takes Tylenol for pain but is not on any DMARDs. Symptoms are worse in the RLE but are infrequent. Schizoaffective disorder is managed by mental health. He smokes 6-8 cigarettes per day depending on the day.  Ready to work on quitting but states he is allergic to the NicoDerm patches Past Medical History:  Diagnosis Date   Anxiety    Asthma    Asthma due to environmental allergies    Asthma due to seasonal allergies    Bipolar 1 disorder (HCC)    Borderline personality disorder ([place])    Brain bleed (HCC)    Chronic post-traumatic stress disorder (PTSD)    complex chronic with psychotic features and self harm behaviors   Complication of anesthesia    Constipation    Dander (animal) allergy    Hearing loss    right ear   Heart murmur    denies seeing a cardiologist   Hip dysplasia    Hypertension    Gestional    Major depression, chronic    Mood disorder (HCC)    OCD (obsessive compulsive disorder)    Pneumonia    PONV (postoperative nausea and vomiting)    PTSD (post-traumatic stress disorder)    RA (rheumatoid arthritis) ([place])    Rheumatoid arthritis ([place])    Schizophrenia ([place])    "I think that is wrong"   Seizure disorder ([place])    Suicidal  ideations    Suicide attempt [facility])     Past Surgical History:  Procedure Laterality Date   Brain Shunt  1981   "a few hours old"   EYE SURGERY Bilateral Ages (IUD) INSERTION N/A 09/16/2018   Procedure: INTRAUTERINE DEVICE (IUD) INSERTION;  Surgeon: Lavonia Drafts, MD;  Location: [facility];  Service: Gynecology;  Laterality: N/A;   PERINEUM REPAIR N/A 09/16/2018   Procedure: EPISIOTOMY REVISION;  Surgeon: Lavonia Drafts, MD;  Location: [facility];  Service: Gynecology;  Laterality: N/A;   SHUNT REMOVAL  1983   Tooth Removal     multiple    Family History  Problem Relation Age of Onset   Heart attack Mother    Stroke Mother    Liver cancer Mother    Diabetes Mother    Lung cancer Mother    Alcoholism Father    Sleep apnea Brother    Depression Brother    ADD / ADHD Brother    Diabetes Maternal Aunt    Diabetes Paternal Uncle    Colon cancer Paternal Uncle    Diabetes Maternal Grandmother    Dementia Maternal Grandmother     Allergies  Allergen Reactions   Aspartame And Phenylalanine Anaphylaxis, Hives, Diarrhea and  Other (See Comments)    Artifical sweetners - diarrhea   Benadryl [Diphenhydramine] Anaphylaxis, Diarrhea and Other (See Comments)    Blisters   Other Anaphylaxis, Nausea And Vomiting, Rash and Other (See Comments)    Aspartame- Blisters Dust- Worsens asthma Ragweed- Worsens asthma, face gets red, and sneezing Animal Fur/Dander- Worsens asthma and sneezing     Scallops [Shellfish Allergy] Anaphylaxis, Diarrhea and Nausea And Vomiting   Yellow Jacket Venom [Bee Venom] Anaphylaxis, Diarrhea and Nausea And Vomiting    Seizures and numbness   Pollen Extract Other (See Comments)    Runny nose, eyes, and asthma worsens   Tegretol [Carbamazepine] Hives, Diarrhea and Other (See Comments)    Blisters in mouth and increase in seizures   Adhesive [Tape] Rash   Latex Hives and Rash    Blisters, also- Condoms and dental encounters     Outpatient Medications Prior to Visit  Medication Sig Dispense Refill   ARIPiprazole (ABILIFY) 20 MG tablet Take 1 tablet (20 mg total) by mouth daily. 30 tablet 3   FLUoxetine (PROZAC) 20 MG capsule Take 1 capsule (20 mg total) by mouth daily. 30 capsule 3   hydrOXYzine (ATARAX) 25 MG tablet Take 1 tablet (25 mg total) by mouth 3 (three) times daily as needed for anxiety. 90 tablet 3   nicotine (NICODERM CQ - DOSED IN MG/24 HOURS) 14 mg/24hr patch Place 1 patch (14 mg total) onto the skin daily. 28 patch 3   traZODone (DESYREL) 150 MG tablet Take 1 tablet (150 mg total) by mouth at bedtime. 30 tablet 3   No facility-administered medications prior to visit.     ROS Review of Systems  Constitutional:  Negative for activity change, appetite change and fatigue.  HENT:  Positive for hearing loss. Negative for congestion, sinus pressure and sore throat.   Eyes:  Negative for visual disturbance.  Respiratory:  Negative for cough, chest tightness, shortness of breath and wheezing.   Cardiovascular:  Negative for chest pain and palpitations.  Gastrointestinal:  Negative for abdominal distention, abdominal pain and constipation.  Endocrine: Negative for polydipsia.  Genitourinary:  Negative for dysuria and frequency.  Musculoskeletal:  Negative for arthralgias and back pain.  Skin:  Negative for rash.  Neurological:  Negative for tremors, light-headedness and numbness.  Hematological:  Does not bruise/bleed easily.  Psychiatric/Behavioral:  Negative for agitation and behavioral problems.    Objective:  BP 133/88    Pulse 72    Ht 5\' 7"  (1.702 m)    Wt 189 lb 12.8 oz (86.1 kg)    SpO2 99%    BMI 29.73 kg/m   BP/Weight 10/02/2021 03/10/2021 1/69/4503  Systolic BP 888 280 034  Diastolic BP 88 70 79  Wt. (Lbs) 189.8 175 -  BMI 29.73 26.22 -  Some encounter information is confidential and restricted. Go to Review Flowsheets activity to see all data.      Physical Exam Constitutional:       Appearance: He is well-developed.  HENT:     Right Ear: Tympanic membrane normal.     Left Ear: Tympanic membrane normal.  Cardiovascular:     Rate and Rhythm: Normal rate.     Heart sounds: Normal heart sounds. No murmur heard. Pulmonary:     Effort: Pulmonary effort is normal.     Breath sounds: Normal breath sounds. No wheezing or rales.  Chest:     Chest wall: No tenderness.  Abdominal:     General: Bowel sounds are normal. There  is no distension.     Palpations: Abdomen is soft. There is no mass.     Tenderness: There is no abdominal tenderness.  Musculoskeletal:        General: Normal range of motion.     Right lower leg: No edema.     Left lower leg: No edema.  Neurological:     Mental Status: He is alert and oriented to person, place, and time.  Psychiatric:        Mood and Affect: Mood normal.    CMP Latest Ref Rng & Units 07/28/2021 07/26/2021 11/29/2020  Glucose 70 - 99 mg/dL 117(H) 92 75  BUN 6 - 20 mg/dL 17 16 9   Creatinine 0.44 - 1.00 mg/dL 0.68 0.78 0.69  Sodium 135 - 145 mmol/L 137 139 138  Potassium 3.5 - 5.1 mmol/L 3.7 3.6 3.7  Chloride 98 - 111 mmol/L 104 107 107  CO2 22 - 32 mmol/L 24 23 25   Calcium 8.9 - 10.3 mg/dL 9.5 9.0 9.2  Total Protein 6.5 - 8.1 g/dL 7.5 6.3(L) 6.9  Total Bilirubin 0.3 - 1.2 mg/dL 0.5 0.4 1.2  Alkaline Phos 38 - 126 U/L 73 79 83  AST 15 - 41 U/L 14(L) 15 15  ALT 0 - 44 U/L 10 9 12     Lipid Panel     Component Value Date/Time   CHOL 161 07/26/2021 2214   TRIG 116 07/26/2021 2214   HDL 36 (L) 07/26/2021 2214   CHOLHDL 4.5 07/26/2021 2214   VLDL 23 07/26/2021 2214   LDLCALC 102 (H) 07/26/2021 2214    CBC    Component Value Date/Time   WBC 14.5 (H) 07/28/2021 1824   RBC 5.06 07/28/2021 1824   HGB 15.8 (H) 07/28/2021 1824   HGB 16.8 (H) 09/28/2020 1329   HCT 48.4 (H) 07/28/2021 1824   HCT 49.4 (H) 09/28/2020 1329   PLT 285 07/28/2021 1824   PLT 326 09/28/2020 1329   MCV 95.7 07/28/2021 1824   MCV 90 09/28/2020  1329   MCH 31.2 07/28/2021 1824   MCHC 32.6 07/28/2021 1824   RDW 13.1 07/28/2021 1824   RDW 12.4 09/28/2020 1329   LYMPHSABS 4.6 (H) 07/26/2021 2214   LYMPHSABS 2.5 09/28/2020 1329   MONOABS 0.9 07/26/2021 2214   EOSABS 0.3 07/26/2021 2214   EOSABS 0.1 09/28/2020 1329   BASOSABS 0.1 07/26/2021 2214   BASOSABS 0.1 09/28/2020 1329    Lab Results  Component Value Date   HGBA1C 5.2 07/26/2021    Assessment & Plan:  1. Schizoaffective disorder, bipolar type ([place]) Stable Management as per mental health  2. Rheumatoid arthritis, involving unspecified site, unspecified whether rheumatoid factor present (HCC) Infrequent flares We will send of labs and refer to rheumatology after - Sedimentation rate - C-reactive protein - Anti-Smith antibody - Anti-DNA antibody, double-stranded - ANA,IFA RA Diag Pnl w/rflx Tit/Patn  3. Tobacco use disorder Spent 3 minutes counseling on smoking cessation and as well as effect of smoking and he is ready to work on quitting He is also ready to initiate Bupropion -advised to discuss this with mental health provider at tomorrow's visit. - buPROPion (WELLBUTRIN XL) 150 MG 24 hr tablet; Take 1 tablet (150 mg total) by mouth daily. For smoking cessation  Dispense: 30 tablet; Refill: 3  4. Screening for viral disease - HCV Ab w Reflex to Quant PCR  5.  Hearing loss Referred to ENT  Health Care Maintenance: He had his mammogram in Sept 2022; Pap smear at  next visit No orders of the defined types were placed in this encounter.   Follow-up: Return in about 1 month (around 10/30/2021) for Complete physical exam.       Charlott Rakes, MD, FAAFP. [facility] and [facility] [facility], [place]   10/02/2021, 2:03 PM

## 2021-10-03 ENCOUNTER — Other Ambulatory Visit: Payer: Self-pay

## 2021-10-03 ENCOUNTER — Encounter (HOSPITAL_COMMUNITY): Payer: Self-pay | Admitting: Psychiatry

## 2021-10-03 ENCOUNTER — Telehealth (INDEPENDENT_AMBULATORY_CARE_PROVIDER_SITE_OTHER): Payer: Self-pay | Admitting: Psychiatry

## 2021-10-03 DIAGNOSIS — F17200 Nicotine dependence, unspecified, uncomplicated: Secondary | ICD-10-CM

## 2021-10-03 DIAGNOSIS — F251 Schizoaffective disorder, depressive type: Secondary | ICD-10-CM

## 2021-10-03 DIAGNOSIS — F411 Generalized anxiety disorder: Secondary | ICD-10-CM | POA: Diagnosis not present

## 2021-10-03 MED ORDER — TRAZODONE HCL 150 MG PO TABS
150.0000 mg | ORAL_TABLET | Freq: Every day | ORAL | 3 refills | Status: DC
  Filled 2021-10-03: qty 30, 30d supply, fill #0

## 2021-10-03 MED ORDER — ARIPIPRAZOLE 20 MG PO TABS
20.0000 mg | ORAL_TABLET | Freq: Every day | ORAL | 3 refills | Status: DC
  Filled 2021-10-03: qty 30, 30d supply, fill #0

## 2021-10-03 MED ORDER — HYDROXYZINE HCL 25 MG PO TABS
25.0000 mg | ORAL_TABLET | Freq: Three times a day (TID) | ORAL | 3 refills | Status: DC | PRN
  Filled 2021-10-03: qty 90, 30d supply, fill #0

## 2021-10-03 MED ORDER — FLUOXETINE HCL 20 MG PO CAPS
20.0000 mg | ORAL_CAPSULE | Freq: Every day | ORAL | 3 refills | Status: DC
  Filled 2021-10-03: qty 30, 30d supply, fill #0

## 2021-10-03 MED ORDER — NICOTINE 14 MG/24HR TD PT24
14.0000 mg | MEDICATED_PATCH | Freq: Every day | TRANSDERMAL | 3 refills | Status: DC
  Filled 2021-10-03: qty 28, 28d supply, fill #0

## 2021-10-03 NOTE — Progress Notes (Signed)
[facility] MD/PA/NP OP Progress Note Virtual Visit via Telephone Note  I connected with [name] on 10/03/21 at  2:00 PM EST by telephone and verified that I am speaking with the correct person using two identifiers.  Location: Patient: home Provider: Clinic   I discussed the limitations, risks, security and privacy concerns of performing an evaluation and management service by telephone and the availability of in person appointments. I also discussed with the patient that there may be a patient responsible charge related to this service. The patient expressed understanding and agreed to proceed.   I provided 30 minutes of non-face-to-face time during this encounter.  10/03/2021 12:53 PM Amanda [name]  MRN:  [id number]  Chief Complaint: "Im nervous about my court date"  HPI: 41 year old transgender female seen today for follow up psychiatric evaluation.    He has a psychiatric history of anxiety, schizophrenia, schizoaffective disorder bipolar type, bipolar 1, borderline personality, depression OCD, SI, and PTSD.  He is currently managed on Prozac 20 mg daily, Abilify 20 mg daily, trazodone 150 mg nightly, NicoDerm CQ 14 mg patches daily, and hydroxyzine 25 mg 3 times daily.   Today he notes his medications are effective in managing his psychiatric condition.    Today patient was unable to login virtually so his exam was done on the phone. During exam he was pleasant he pleasant, cooperative,and  engaged in conversation. He notes that since his last visit he feels mentally stable. He reports that over his anxiety and depression are well manage. He does note that he has is nervous about his upcoming court case to regain custody of Amanda [name] his son which is tomorrow.  He informed Probation officer that the assisted home visit yesterday and notes that it went well.  He however informed Probation officer that he has been dealing with a custody battle for over 3 years and is hopeful that things will go his way at this time.  A  GAD 7 was conducted on 10/02/2021 and patient scored a 0, at his last visit he scored a 0. A PHQ 9 was also conducted and patient scored a 0, at his last visit he scored a 1.  Today he denies SI/HI/VAH, mania or paranoia.  He endorses adequate sleep and appetite.  Patient notes that he copes with stressors by spending time with his friends and writing.  He also notes that he has support from several friends who reminds him to take his medications.  At this time no medication changes made.  Patient agreeable to continue medications as prescribed.  He will follow-up with outpatient counseling for therapy.  No other concerns at this time.  .   Visit Diagnosis:    ICD-10-CM   1. Schizoaffective disorder, depressive type ([place])  F25.1 ARIPiprazole (ABILIFY) 20 MG tablet    FLUoxetine (PROZAC) 20 MG capsule    traZODone (DESYREL) 150 MG tablet    2. Tobacco use disorder  F17.200 nicotine (NICODERM CQ - DOSED IN MG/24 HOURS) 14 mg/24hr patch    3. GAD (generalized anxiety disorder)  F41.1 FLUoxetine (PROZAC) 20 MG capsule    hydrOXYzine (ATARAX) 25 MG tablet    traZODone (DESYREL) 150 MG tablet       Past Psychiatric History: anxiety, schizophrenia, schizoaffective disorder bipolar type, bipolar 1, borderline personality, depression OCD, SI, and PTSD.  Past Medical History:  Past Medical History:  Diagnosis Date   Anxiety    Asthma    Asthma due to environmental allergies    Asthma  due to seasonal allergies    Bipolar 1 disorder (HCC)    Borderline personality disorder ([place])    Brain bleed (HCC)    Chronic post-traumatic stress disorder (PTSD)    complex chronic with psychotic features and self harm behaviors   Complication of anesthesia    Constipation    Dander (animal) allergy    Hearing loss    right ear   Heart murmur    denies seeing a cardiologist   Hip dysplasia    Hypertension    Gestional    Major depression, chronic    Mood disorder (HCC)    OCD (obsessive compulsive  disorder)    Pneumonia    PONV (postoperative nausea and vomiting)    PTSD (post-traumatic stress disorder)    RA (rheumatoid arthritis) ([place])    Rheumatoid arthritis ([place])    Schizophrenia ([place])    "I think that is wrong"   Seizure disorder ([place])    Suicidal ideations    Suicide attempt [facility])     Past Surgical History:  Procedure Laterality Date   Brain Shunt  1981   "a few hours old"   EYE SURGERY Bilateral [place] (IUD) INSERTION N/A 09/16/2018   Procedure: INTRAUTERINE DEVICE (IUD) INSERTION;  Surgeon: Lavonia Drafts, MD;  Location: [facility];  Service: Gynecology;  Laterality: N/A;   PERINEUM REPAIR N/A 09/16/2018   Procedure: EPISIOTOMY REVISION;  Surgeon: Lavonia Drafts, MD;  Location: [facility];  Service: Gynecology;  Laterality: N/A;   SHUNT REMOVAL  1983   Tooth Removal     multiple    Family Psychiatric History:  Father alcohol use  Family History:  Family History  Problem Relation Age of Onset   Heart attack Mother    Stroke Mother    Liver cancer Mother    Diabetes Mother    Lung cancer Mother    Alcoholism Father    Sleep apnea Brother    Depression Brother    ADD / ADHD Brother    Diabetes Maternal Aunt    Diabetes Paternal Uncle    Colon cancer Paternal Uncle    Diabetes Maternal Grandmother    Dementia Maternal Grandmother     Social History:  Social History   Socioeconomic History   Marital status: Single    Spouse name: Not on file   Number of children: 1   Years of education: 12   Highest education level: 12th grade  Occupational History   Not on file  Tobacco Use   Smoking status: Every Day    Packs/day: 0.50    Years: 26.00    Pack years: 13.00    Types: Cigarettes   Smokeless tobacco: Never  Vaping Use   Vaping Use: Former  Substance and Sexual Activity   Alcohol use: Not Currently   Drug use: Not Currently    Types: Marijuana    Comment: sober from from drugs    Sexual activity: Not Currently     Birth control/protection: None  Other Topics Concern   Not on file  Social History Narrative   ** Merged History Encounter **       Lives with her fiance, friend, and fiance's brother   Right handed   Social Determinants of Health   Financial Resource Strain: Not on file  Food Insecurity: Not on file  Transportation Needs: Not on file  Physical Activity: Not on file  Stress: Not on file  Social Connections: Not on file  Allergies:  Allergies  Allergen Reactions   Aspartame And Phenylalanine Anaphylaxis, Hives, Diarrhea and Other (See Comments)    Artifical sweetners - diarrhea   Benadryl [Diphenhydramine] Anaphylaxis, Diarrhea and Other (See Comments)    Blisters   Other Anaphylaxis, Nausea And Vomiting, Rash and Other (See Comments)    Aspartame- Blisters Dust- Worsens asthma Ragweed- Worsens asthma, face gets red, and sneezing Animal Fur/Dander- Worsens asthma and sneezing     Scallops [Shellfish Allergy] Anaphylaxis, Diarrhea and Nausea And Vomiting   Yellow Jacket Venom [Bee Venom] Anaphylaxis, Diarrhea and Nausea And Vomiting    Seizures and numbness   Pollen Extract Other (See Comments)    Runny nose, eyes, and asthma worsens   Tegretol [Carbamazepine] Hives, Diarrhea and Other (See Comments)    Blisters in mouth and increase in seizures   Adhesive [Tape] Rash   Latex Hives and Rash    Blisters, also- Condoms and dental encounters    Metabolic Disorder Labs: Lab Results  Component Value Date   HGBA1C 5.2 07/26/2021   MPG 102.54 07/26/2021   MPG 111 12/03/2020   Lab Results  Component Value Date   PROLACTIN 39.6 (H) 07/26/2021   PROLACTIN 36.9 (H) 11/11/2018   Lab Results  Component Value Date   CHOL 161 07/26/2021   TRIG 116 07/26/2021   HDL 36 (L) 07/26/2021   CHOLHDL 4.5 07/26/2021   VLDL 23 07/26/2021   LDLCALC 102 (H) 07/26/2021   LDLCALC 108 (H) 12/03/2020   Lab Results  Component Value Date   TSH 2.220 07/26/2021   TSH 2.786  11/11/2018    Therapeutic Level Labs: No results found for: LITHIUM No results found for: VALPROATE No components found for:  CBMZ  Current Medications: Current Outpatient Medications  Medication Sig Dispense Refill   ARIPiprazole (ABILIFY) 20 MG tablet Take 1 tablet (20 mg total) by mouth daily. 30 tablet 3   buPROPion (WELLBUTRIN XL) 150 MG 24 hr tablet Take 1 tablet (150 mg total) by mouth daily. For smoking cessation 30 tablet 3   FLUoxetine (PROZAC) 20 MG capsule Take 1 capsule (20 mg total) by mouth daily. 30 capsule 3   hydrOXYzine (ATARAX) 25 MG tablet Take 1 tablet (25 mg total) by mouth 3 (three) times daily as needed for anxiety. 90 tablet 3   nicotine (NICODERM CQ - DOSED IN MG/24 HOURS) 14 mg/24hr patch Place 1 patch (14 mg total) onto the skin daily. 28 patch 3   traZODone (DESYREL) 150 MG tablet Take 1 tablet (150 mg total) by mouth at bedtime. 30 tablet 3   No current facility-administered medications for this visit.     Musculoskeletal: Strength & Muscle Tone:  Unable to assess due to telephone visit Gait & Station:  Unable to assess due to telephone visit Patient leans: N/A  Psychiatric Specialty Exam: Review of Systems  There were no vitals taken for this visit.There is no height or weight on file to calculate BMI.  General Appearance:  Unable to assess due to telephone visit  Eye Contact:  Unable to assess due to telephone visit  Speech:  Clear and Coherent and Normal Rate  Volume:  Normal  Mood:  Euthymic  Affect:  Appropriate and Congruent  Thought Process:  Coherent, Goal Directed, and Linear  Orientation:  Full (Time, Place, and Person)  Thought Content: WDL and Logical   Suicidal Thoughts:  No  Homicidal Thoughts:  No  Memory:  Immediate;   Good Recent;   Good Remote;  Good  Judgement:  Good  Insight:  Good  Psychomotor Activity:   Unable to assess due to telephone visit  Concentration:  Concentration: Good and Attention Span: Good  Recall:   Good  Fund of Knowledge: Good  Language: Good  Akathisia:   Unable to assess due to telephone visit  Handed:  Right  AIMS (if indicated): not done  Assets:  Communication Skills Desire for Improvement Financial Resources/Insurance Housing Leisure Time  ADL's:  Intact  Cognition: WNL  Sleep:  Good   Screenings: AIMS    Flowsheet Row Admission (Discharged) from 07/27/2021 in [facility] Admission (Discharged) from 11/30/2020 in [facility] Admission (Discharged) from 11/01/2019 in [facility] Admission (Discharged) from 11/10/2018 in [facility]  AIMS Total Score 0 0 0 0      AUDIT    Flowsheet Row Admission (Discharged) from 07/27/2021 in [facility] Admission (Discharged) from 11/01/2019 in [facility] Admission (Discharged) from 11/10/2018 in [facility]  Alcohol Use Disorder Identification Test Final Score (AUDIT) 0 5 0      GAD-7    Flowsheet Row Office Visit from 10/02/2021 in [facility] Video Visit from 08/16/2021 in [facility] from 07/10/2021 in [facility] Counselor from 06/27/2021 in [facility] from 04/06/2021 in [facility]  Total GAD-7 Score 0 0 5 6 3       [place] Visit from 09/28/2019 in [facility]  Total Score (max 30 points ) 17      PHQ2-9    [place] Visit from 10/02/2021 in [facility] Video Visit from 08/16/2021 in [facility] from 07/10/2021 in [facility] Counselor from 06/27/2021 in  [facility] from 04/06/2021 in [facility]  PHQ-2 Total Score 0 0 0 0 0  PHQ-9 Total Score 0 1 1 2 3       Flowsheet Row Video Visit from 08/16/2021 in [facility] Admission (Discharged) from 07/27/2021 in [facility] ED from 07/26/2021 in [facility] Error: Question 6 not populated Error: Q2 is Yes, you must answer 3, 4, and 5 Moderate Risk        Assessment and Plan: Patient notes that he is doing well on his medication. No medication changes made today. Patient agreeable to continue medications as prescribed.  1. Schizoaffective disorder, depressive type (HCC)  Continue- ARIPiprazole (ABILIFY) 20 MG tablet; Take 1 tablet (20 mg total) by mouth daily.  Dispense: 30 tablet; Refill: 3 Continue- FLUoxetine (PROZAC) 20 MG capsule; Take 1 capsule (20 mg total) by mouth daily.  Dispense: 30 capsule; Refill: 3 Continue- traZODone (DESYREL) 150 MG tablet; Take 1 tablet (150 mg total) by mouth at bedtime.  Dispense: 30 tablet; Refill: 3  2. GAD (generalized anxiety disorder)  Continue- FLUoxetine (PROZAC) 20 MG capsule; Take 1 capsule (20 mg total) by mouth daily.  Dispense: 30 capsule; Refill: 3 Continue- hydrOXYzine (ATARAX) 25 MG tablet; Take 1 tablet (25 mg total) by mouth 3 (three) times daily as needed for anxiety.  Dispense: 90 tablet; Refill:  3 Continue- traZODone (DESYREL) 150 MG tablet; Take 1 tablet (150 mg total) by mouth at bedtime.  Dispense: 30 tablet; Refill: 3  3. Tobacco use disorder  Continue- nicotine (NICODERM CQ - DOSED IN MG/24 HOURS) 14 mg/24hr patch; Place 1 patch (14 mg total) onto the skin daily.  Dispense: 28 patch; Refill: 3   Follow up in 2 months    Salley Slaughter, NP 10/03/2021, 12:53 PM

## 2021-10-06 LAB — ANA,IFA RA DIAG PNL W/RFLX TIT/PATN
ANA Titer 1: NEGATIVE
Cyclic Citrullin Peptide Ab: 3 units (ref 0–19)
Rheumatoid fact SerPl-aCnc: <10 IU/mL (ref <14.0)

## 2021-10-06 LAB — HCV AB W REFLEX TO QUANT PCR: HCV Ab: NONREACTIVE

## 2021-10-06 LAB — ANTI-DNA ANTIBODY, DOUBLE-STRANDED: dsDNA Ab: 1 IU/mL (ref 0–9)

## 2021-10-06 LAB — C-REACTIVE PROTEIN: CRP: 2 mg/L (ref 0–10)

## 2021-10-06 LAB — ANTI-SMITH ANTIBODY: ENA SM Ab Ser-aCnc: <0.2 AI (ref 0.0–0.9)

## 2021-10-06 LAB — SEDIMENTATION RATE: Sed Rate: 3 mm/hr (ref 0–32)

## 2021-10-06 LAB — HCV INTERPRETATION

## 2021-10-09 ENCOUNTER — Other Ambulatory Visit: Payer: Self-pay

## 2021-10-10 ENCOUNTER — Other Ambulatory Visit: Payer: Self-pay

## 2021-10-17 ENCOUNTER — Other Ambulatory Visit: Payer: Self-pay

## 2021-10-17 ENCOUNTER — Ambulatory Visit (INDEPENDENT_AMBULATORY_CARE_PROVIDER_SITE_OTHER): Payer: Medicare (Managed Care) | Admitting: Licensed Clinical Social Worker

## 2021-10-17 DIAGNOSIS — F251 Schizoaffective disorder, depressive type: Secondary | ICD-10-CM | POA: Diagnosis not present

## 2021-10-17 NOTE — Plan of Care (Signed)
Pt agreeable to updated treatment plan.
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
## 2021-11-01 ENCOUNTER — Encounter: Payer: Medicare (Managed Care) | Admitting: Family Medicine

## 2021-11-03 ENCOUNTER — Ambulatory Visit (HOSPITAL_COMMUNITY)
Admission: EM | Admit: 2021-11-03 | Discharge: 2021-11-04 | Disposition: A | Discharge status: Home / Self Care | Payer: Medicare (Managed Care) | Attending: Family | Admitting: Family

## 2021-11-03 DIAGNOSIS — F332 Major depressive disorder, recurrent severe without psychotic features: Secondary | ICD-10-CM | POA: Diagnosis present

## 2021-11-03 DIAGNOSIS — F4310 Post-traumatic stress disorder, unspecified: Secondary | ICD-10-CM | POA: Diagnosis not present

## 2021-11-03 DIAGNOSIS — Z20822 Contact with and (suspected) exposure to covid-19: Secondary | ICD-10-CM | POA: Diagnosis not present

## 2021-11-03 DIAGNOSIS — R45851 Suicidal ideations: Secondary | ICD-10-CM | POA: Insufficient documentation

## 2021-11-03 DIAGNOSIS — F319 Bipolar disorder, unspecified: Secondary | ICD-10-CM | POA: Diagnosis not present

## 2021-11-03 DIAGNOSIS — F429 Obsessive-compulsive disorder, unspecified: Secondary | ICD-10-CM | POA: Insufficient documentation

## 2021-11-03 DIAGNOSIS — F603 Borderline personality disorder: Secondary | ICD-10-CM | POA: Insufficient documentation

## 2021-11-03 LAB — CBC WITH DIFFERENTIAL/PLATELET
Abs Immature Granulocytes: 0.07 10*3/uL (ref 0.00–0.07)
Basophils Absolute: 0.1 10*3/uL (ref 0.0–0.1)
Basophils Relative: 1 %
Eosinophils Absolute: 0.1 10*3/uL (ref 0.0–0.5)
Eosinophils Relative: 0 %
HCT: 47.9 % — ABNORMAL HIGH (ref 36.0–46.0)
Hemoglobin: 16.8 g/dL — ABNORMAL HIGH (ref 12.0–15.0)
Immature Granulocytes: 0 %
Lymphocytes Relative: 16 %
Lymphs Abs: 2.6 10*3/uL (ref 0.7–4.0)
MCH: 31.8 pg (ref 26.0–34.0)
MCHC: 35.1 g/dL (ref 30.0–36.0)
MCV: 90.5 fL (ref 80.0–100.0)
Monocytes Absolute: 0.9 10*3/uL (ref 0.1–1.0)
Monocytes Relative: 5 %
Neutro Abs: 12.1 10*3/uL — ABNORMAL HIGH (ref 1.7–7.7)
Neutrophils Relative %: 78 %
Platelets: 289 10*3/uL (ref 150–400)
RBC: 5.29 MIL/uL — ABNORMAL HIGH (ref 3.87–5.11)
RDW: 12.7 % (ref 11.5–15.5)
WBC: 15.7 10*3/uL — ABNORMAL HIGH (ref 4.0–10.5)
nRBC: 0 % (ref 0.0–0.2)

## 2021-11-03 LAB — POCT URINE DRUG SCREEN - MANUAL ENTRY (I-SCREEN)
POC Amphetamine UR: NOT DETECTED
POC Buprenorphine (BUP): NOT DETECTED
POC Cocaine UR: NOT DETECTED
POC Marijuana UR: NOT DETECTED
POC Methadone UR: NOT DETECTED
POC Methamphetamine UR: NOT DETECTED
POC Morphine: NOT DETECTED
POC Oxazepam (BZO): NOT DETECTED
POC Oxycodone UR: NOT DETECTED
POC Secobarbital (BAR): NOT DETECTED

## 2021-11-03 LAB — COMPREHENSIVE METABOLIC PANEL
ALT: 17 U/L (ref 0–44)
AST: 16 U/L (ref 15–41)
Albumin: 4.4 g/dL (ref 3.5–5.0)
Alkaline Phosphatase: 89 U/L (ref 38–126)
Anion gap: 10 (ref 5–15)
BUN: 10 mg/dL (ref 6–20)
CO2: 24 mmol/L (ref 22–32)
Calcium: 9.2 mg/dL (ref 8.9–10.3)
Chloride: 104 mmol/L (ref 98–111)
Creatinine, Ser: 0.85 mg/dL (ref 0.44–1.00)
GFR, Estimated: >60 mL/min (ref >60)
Glucose, Bld: 81 mg/dL (ref 70–99)
Potassium: 4.4 mmol/L (ref 3.5–5.1)
Sodium: 138 mmol/L (ref 135–145)
Total Bilirubin: 0.7 mg/dL (ref 0.3–1.2)
Total Protein: 7.4 g/dL (ref 6.5–8.1)

## 2021-11-03 LAB — POC SARS CORONAVIRUS 2 AG -  ED: SARS Coronavirus 2 Ag: NEGATIVE

## 2021-11-03 LAB — RESP PANEL BY RT-PCR (FLU A&B, COVID) ARPGX2
Influenza A by PCR: NEGATIVE
Influenza B by PCR: NEGATIVE
SARS Coronavirus 2 by RT PCR: NEGATIVE

## 2021-11-03 LAB — LIPID PANEL
Cholesterol: 207 mg/dL — ABNORMAL HIGH (ref 0–200)
HDL: 47 mg/dL (ref >40)
LDL Cholesterol: 143 mg/dL — ABNORMAL HIGH (ref 0–99)
Total CHOL/HDL Ratio: 4.4 RATIO
Triglycerides: 85 mg/dL (ref <150)
VLDL: 17 mg/dL (ref 0–40)

## 2021-11-03 LAB — ETHANOL: Alcohol, Ethyl (B): <10 mg/dL (ref <10)

## 2021-11-03 LAB — TSH: TSH: 1.703 u[IU]/mL (ref 0.350–4.500)

## 2021-11-03 LAB — MAGNESIUM: Magnesium: 1.9 mg/dL (ref 1.7–2.4)

## 2021-11-03 MED ORDER — HYDROXYZINE HCL 25 MG PO TABS
25.0000 mg | ORAL_TABLET | Freq: Three times a day (TID) | ORAL | Status: DC | PRN
  Filled 2021-11-03: qty 10

## 2021-11-03 MED ORDER — FLUOXETINE HCL 20 MG PO CAPS
20.0000 mg | ORAL_CAPSULE | Freq: Every day | ORAL | Status: DC
  Administered 2021-11-04: 20 mg via ORAL
  Filled 2021-11-03: qty 7
  Filled 2021-11-03: qty 1

## 2021-11-03 MED ORDER — NICOTINE 14 MG/24HR TD PT24
14.0000 mg | MEDICATED_PATCH | Freq: Every day | TRANSDERMAL | Status: DC
  Administered 2021-11-03 – 2021-11-04 (×2): 14 mg via TRANSDERMAL
  Filled 2021-11-03 (×2): qty 1

## 2021-11-03 MED ORDER — ARIPIPRAZOLE 10 MG PO TABS
20.0000 mg | ORAL_TABLET | Freq: Every day | ORAL | Status: DC
Start: 2021-11-04 — End: 2021-11-04
  Administered 2021-11-04: 20 mg via ORAL
  Filled 2021-11-03: qty 14
  Filled 2021-11-03: qty 2

## 2021-11-03 MED ORDER — MAGNESIUM HYDROXIDE 400 MG/5ML PO SUSP: 30.0000 mL | Freq: Every day | ORAL | Status: DC | PRN

## 2021-11-03 MED ORDER — ALUM & MAG HYDROXIDE-SIMETH 200-200-20 MG/5ML PO SUSP: 30.0000 mL | ORAL | Status: DC | PRN

## 2021-11-03 MED ORDER — ACETAMINOPHEN 325 MG PO TABS: 650.0000 mg | ORAL_TABLET | Freq: Four times a day (QID) | ORAL | Status: DC | PRN

## 2021-11-03 MED ORDER — TRAZODONE HCL 100 MG PO TABS
200.0000 mg | ORAL_TABLET | Freq: Every day | ORAL | Status: DC
  Administered 2021-11-03: 200 mg via ORAL
  Filled 2021-11-03: qty 2
  Filled 2021-11-03: qty 14

## 2021-11-03 NOTE — Progress Notes (Signed)
[name] was oriented to his new environment, receivied a snack and later received dinner. She remained in the milieu drifting off to sleep at intervals in her chair bed. ?

## 2021-11-03 NOTE — ED Notes (Signed)
Pt sleeping at present, no distress noted. Respirations even & unlabored.  Monitoring for safety. 

## 2021-11-03 NOTE — BH Assessment (Signed)
Amanda [name], Emergent; 41 year old present this date unaccompanied.  Pt reports SI with a plan to cut her self.  Pt reports that she has a history of cutting.  Pt reports hearing voices, "I am better off dead"; also patient reports seeing shadows.  Pt denies HI and drug/alcohol use.  Pt admits to prior MH diagnosis or prescribed medication for symptom management.  MSE signed by patient. ?

## 2021-11-03 NOTE — ED Notes (Signed)
Patient arrived on unit. Patient given lunch. Patient calm and cooperative. Patient safe on unit with continued monitoring. ?

## 2021-11-03 NOTE — ED Notes (Signed)
Pt given hot meal

## 2021-11-03 NOTE — ED Notes (Signed)
Pt awake and alert.  Sitting up in bed.  Reports increase of suicidal thoughts along with visual and auditory hallucinations.  Pt searched prior to arrival on milieu.  Oriented to unit and Pt was given sandwich and juice.  No further distress noted.   ?

## 2021-11-03 NOTE — ED Provider Notes (Signed)
Behavioral Health Admission H&P ?(FBC & OBS) ? ?Date: 11/03/21 ?Patient Name: Amanda [name] ?MRN: [id number] ?Chief Complaint:  ?Chief Complaint  ?Patient presents with  ? Suicidal  ?   ? ?Diagnoses:  ?Final diagnoses:  ?MDD (major depressive disorder), recurrent severe, without psychosis ([place])  ? ? ?HPI: Patient presents voluntarily to [facility] behavioral health for walk-in assessment.  ? ?Patient prefers to be called "Amanda [name]," prefers female pronouns. ? ?Amanda [name] reports suicidal ideation with plan to cut self for a few days.  Patient endorses multiple suicide attempts, does not recall exact number.  He reports most recent suicide attempt 2 years ago.  He reports he would not follow through with his plan because he has a 72-year-old son.  He is unable to contract for safety at this time.  Reports if he goes home he believes he will "cut myself." ? ?Patient has been diagnosed with schizoaffective disorder, borderline personality disorder, PTSD and OCD.  He is followed by outpatient psychiatry and counseling at [facility] behavioral health.  He is compliant with medications including aripiprazole 20 mg daily, Prozac 20 mg daily, hydroxyzine 25 mg 3 times daily as needed/anxiety and trazodone 150 mg nightly.  He reports his trazodone is not as effective as it has been in the past.  Patient reports he believes he sleeps an hour of 4 hours per night for the last 1 week.  Would like to increase trazodone from 150 mg to 200 mg nightly.  He endorses history of inpatient psychiatric hospitalization.  Most recent psychiatric hospitalization at [facility] behavioral health in December 2022. ? ?He shares that he has experienced increase in nightmares x2 weeks. ?He also endorses auditory and visual hallucinations, chronically.  He reports visual hallucinations include "I see shadow people."  Auditory hallucinations include apparent ruminations.  He states "I hear voices that say I am a bad father to my son." ? ?Patient is  assessed face-to-face by nurse practitioner.  He is seated in assessment area, no acute distress.  He is alert and oriented, pleasant and cooperative during assessment.  ?He presents with depressed mood, congruent affect.  ?He denies homicidal ideation. He has normal speech and behavior. Patient is able to converse coherently with goal-directed thoughts and no distractibility or preoccupation.  He denies paranoia.  Objectively there is no evidence of psychosis/mania or delusional thinking. ? ?Amanda [name] resides alone in [place] with his emotional support dog.  He denies access to weapons.  He is not currently employed outside the home.  He denies alcohol and substance use.  Patient endorses average appetite. ? ?Patient offered support and encouragement.  ? ? ?PHQ 2-9:  ?Health and safety inspector from 10/17/2021 in [facility] Office Visit from 10/02/2021 in [place] Video Visit from 08/16/2021 in [facility]  ?Thoughts that you would be better off dead, or of hurting yourself in some way Not at all Not at all Not at all  ?PHQ-9 Total Score 6 0 1  ? ?  ?  ?Flowsheet Row Video Visit from 08/16/2021 in [facility] Admission (Discharged) from 07/27/2021 in [place] [facility] ED from 07/26/2021 in [facility]  ?C-SSRS RISK CATEGORY Error: Question 6 not populated Error: Q2 is Yes, you must answer 3, 4, and 5 Moderate Risk  ? ?  ?  ? ?Total Time spent with patient: 30 minutes ? ?Musculoskeletal  ?Strength & Muscle Tone: within normal  limits ?Gait & Station: normal ?Patient leans: N/A ? ?Psychiatric Specialty Exam  ?Presentation ?General Appearance: Appropriate for Environment; Casual ? ?Eye Contact:Good ? ?Speech:Clear and Coherent; Normal Rate ? ?Speech Volume:Normal ? ?Handedness:Right ? ? ?Mood and Affect  ?Mood:Depressed ? ?Affect:Congruent;  Depressed ? ? ?Thought Process  ?Thought Processes:Coherent; Goal Directed; Linear ? ?Descriptions of Associations:Intact ? ?Orientation:Full (Time, Place and Person) ? ?Thought Content:Logical; WDL ?  Duration of Psychotic Symptoms: Less than six months ? ?Hallucinations:Hallucinations: Visual; Auditory ?Description of Auditory Hallucinations: "I hear a voice saying that I am not a good father" ?Description of Visual Hallucinations: "I see blood everywhere" ? ?Ideas of Reference:None ? ?Suicidal Thoughts:Suicidal Thoughts: Yes, Passive ?SI Passive Intent and/or Plan: Without Intent; With Plan ? ?Homicidal Thoughts:Homicidal Thoughts: No ? ? ?Sensorium  ?Memory:Immediate Good; Recent Good; Remote Good ? ?Judgment:Fair ? ?Insight:Present ? ? ?Executive Functions  ?Concentration:Good ? ?Attention Span:Good ? ?Recall:Good ? ?Fund of [place] ? ?Language:Good ? ? ?Psychomotor Activity  ?Psychomotor Activity:Psychomotor Activity: Normal ? ? ?Assets  ?Assets:Communication Skills; Desire for Improvement; Financial Resources/Insurance; Housing; Intimacy; Leisure Time; Physical Health; Resilience; Social Support; Talents/Skills ? ? ?Sleep  ?Sleep:Sleep: Poor ?Number of Hours of Sleep: 4 ? ? ?Nutritional Assessment (For OBS and FBC admissions only) ?Has the patient had a weight loss or gain of 10 pounds or more in the last 3 months?: No ?Has the patient had a decrease in food intake/or appetite?: No ?Does the patient have dental problems?: No ?Does the patient have eating habits or behaviors that may be indicators of an eating disorder including binging or inducing vomiting?: No ?Has the patient recently lost weight without trying?: 0 ?Has the patient been eating poorly because of a decreased appetite?: 0 ?Malnutrition Screening Tool Score: 0 ? ? ? ?Physical Exam ?Vitals and nursing note reviewed.  ?Constitutional:   ?   Appearance: Normal appearance. He is well-developed.  ?HENT:  ?   Head: Normocephalic and  atraumatic.  ?   Nose: Nose normal.  ?Cardiovascular:  ?   Rate and Rhythm: Normal rate.  ?Pulmonary:  ?   Effort: Pulmonary effort is normal.  ?Musculoskeletal:     ?   General: Normal range of motion.  ?   Cervical back: Normal range of motion.  ?Skin: ?   General: Skin is warm and dry.  ?Neurological:  ?   Mental Status: He is alert and oriented to person, place, and time.  ?Psychiatric:     ?   Attention and Perception: Attention normal. He perceives auditory and visual hallucinations.     ?   Mood and Affect: Affect normal. Mood is depressed.     ?   Speech: Speech normal.     ?   Behavior: Behavior normal. Behavior is cooperative.     ?   Cognition and Memory: Cognition and memory normal.     ?   Judgment: Judgment normal.  ? ?Review of Systems  ?Constitutional: Negative.   ?HENT: Negative.    ?Eyes: Negative.   ?Respiratory: Negative.    ?Cardiovascular: Negative.   ?Gastrointestinal: Negative.   ?Genitourinary: Negative.   ?Musculoskeletal: Negative.   ?Skin: Negative.   ?Neurological: Negative.   ?Endo/Heme/Allergies: Negative.   ?Psychiatric/Behavioral:  Positive for depression, hallucinations and suicidal ideas. The patient has insomnia.   ? ?Blood pressure (!) 142/80, pulse 78, temperature 98.1 ?F (36.7 ?C), temperature source Oral, resp. rate 18, SpO2 99 %. There is no height or weight on file to calculate BMI. ? ?Past Psychiatric  History: Schizoaffective disorder, borderline personality disorder, PTSD, OCD ? ?Is the patient at risk to self? Yes  ?Has the patient been a risk to self in the past 6 months? Yes .    ?Has the patient been a risk to self within the distant past? Yes   ?Is the patient a risk to others? No   ?Has the patient been a risk to others in the past 6 months? No   ?Has the patient been a risk to others within the distant past? No  ? ?Past Medical History:  ?Past Medical History:  ?Diagnosis Date  ? Anxiety   ? Asthma   ? Asthma due to environmental allergies   ? Asthma due to seasonal  allergies   ? Bipolar 1 disorder ([place])   ? Borderline personality disorder ([place])   ? Brain bleed ([place])   ? Chronic post-traumatic stress disorder (PTSD)   ? complex chronic with psychotic features and self harm behaviors  ? Com
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
## 2021-11-03 NOTE — ED Notes (Signed)
Pt A&O x 4, awake & resting at present, no distress noted. Calm & cooperative.  Monitoring for safety. ?

## 2021-11-04 DIAGNOSIS — F319 Bipolar disorder, unspecified: Secondary | ICD-10-CM | POA: Diagnosis not present

## 2021-11-04 LAB — PROLACTIN: Prolactin: 15.3 ng/mL (ref 4.8–23.3)

## 2021-11-04 MED ORDER — TRAZODONE HCL 100 MG PO TABS
200.0000 mg | ORAL_TABLET | Freq: Every day | ORAL | 0 refills | Status: DC
  Filled 2021-11-04: qty 30, 15d supply, fill #0

## 2021-11-04 NOTE — ED Notes (Signed)
Reviewed AVS discharge instructions with pt. Verbalized understanding of all instructions provided. Awaiting pt's ride to home. Safety maintained. ?

## 2021-11-04 NOTE — ED Notes (Signed)
Pt eating breakfast. Denies concerns at present. Denies SI/HI/AVH. Pt states, "I just needed to clear my  head. I feel much better this morning. I'm hoping I can go home today. Would you put that in my note for the doctor". Support provided. Informed pt to notify staff with any needs or concerns. Will continue to monitor for safety. ?

## 2021-11-04 NOTE — ED Provider Notes (Signed)
FBC/OBS ASAP Discharge Summary ? ?Date and Time: 11/04/2021 12:30 PM  ?Name: Amanda [name]  ?MRN:  [id number]  ? ?Discharge Diagnoses:  ?Final diagnoses:  ?MDD (major depressive disorder), recurrent severe, without psychosis ([place])  ? ? ?Subjective: Amanda [name] reported " I wasn't sleeping and I need to get my medication straight." ? ?Evaluation: Amanda [name] was seen and evaluated face-to-face. He is awake, alert and oriented x3.  Denying suicidal or homicidal ideations.  Denies auditory visual hallucinations.  States He became overwhelmed with suicidal thoughts due to lack of sleep.  Patient was observed overnight observation and her trazodone was increased. He reports feeling better overall.  Reports He is followed by therapy and psychiatry for [facility] urgent care facility.  Denied any other safety concerns.  Support, encouragement  and reassurance was provided. ? ?Per admission assessment: "Amanda [name] reports suicidal ideation with plan to cut self for a few days.  Patient endorses multiple suicide attempts, does not recall exact number.  He reports most recent suicide attempt 2 years ago.  He reports he would not follow through with his plan because he has a 34-year-old son.  He is unable to contract for safety at this time.  Reports if he goes home he believes he will "cut myself." ? ?Stay Summary: "Patient has been diagnosed with schizoaffective disorder, borderline personality disorder, PTSD and OCD.  He is followed by outpatient psychiatry and counseling at [facility] behavioral [facility]."  ? ?Total Time spent with patient: 20 minutes ? ?Past Psychiatric History:  ?Past Medical History:  ?Past Medical History:  ?Diagnosis Date  ? Anxiety   ? Asthma   ? Asthma due to environmental allergies   ? Asthma due to seasonal allergies   ? Bipolar 1 disorder ([place])   ? Borderline personality disorder ([place])   ? Brain bleed ([place])   ? Chronic post-traumatic stress disorder (PTSD)   ? complex chronic with psychotic features and  self harm behaviors  ? Complication of anesthesia   ? Constipation   ? Dander (animal) allergy   ? Hearing loss   ? right ear  ? Heart murmur   ? denies seeing a cardiologist  ? Hip dysplasia   ? Hypertension   ? Gestional   ? Major depression, chronic   ? Mood disorder ([place])   ? OCD (obsessive compulsive disorder)   ? Pneumonia   ? PONV (postoperative nausea and vomiting)   ? PTSD (post-traumatic stress disorder)   ? RA (rheumatoid arthritis) ([place])   ? Rheumatoid arthritis ([place])   ? Schizophrenia ([place])   ? "I think that is wrong"  ? Seizure disorder ([place])   ? Suicidal ideations   ? Suicide attempt [facility])   ?  ?Past Surgical History:  ?Procedure Laterality Date  ? Brain Shunt  1981  ? "a few hours old"  ? EYE SURGERY Bilateral 1985  ? INTRAUTERINE DEVICE (IUD) INSERTION N/A 09/16/2018  ? Procedure: INTRAUTERINE DEVICE (IUD) INSERTION;  Surgeon: Lavonia Drafts, MD;  Location: [facility];  Service: Gynecology;  Laterality: N/A;  ? PERINEUM REPAIR N/A 09/16/2018  ? Procedure: EPISIOTOMY REVISION;  Surgeon: Lavonia Drafts, MD;  Location: [facility];  Service: Gynecology;  Laterality: N/A;  ? [place]  ? Tooth Removal    ? multiple  ? ?Family History:  ?Family History  ?Problem Relation Age of Onset  ? Heart attack Mother   ? Stroke Mother   ? Liver cancer Mother   ? Diabetes Mother   ?  Lung cancer Mother   ? Alcoholism Father   ? Sleep apnea Brother   ? Depression Brother   ? ADD / ADHD Brother   ? Diabetes Maternal Aunt   ? Diabetes Paternal Uncle   ? Colon cancer Paternal Uncle   ? Diabetes Maternal Grandmother   ? Dementia Maternal Grandmother   ? ?Family Psychiatric History:  ?Social History:  ?Social History  ? ?Substance and Sexual Activity  ?Alcohol Use Not Currently  ?   ?Social History  ? ?Substance and Sexual Activity  ?Drug Use Not Currently  ? Types: Marijuana  ? Comment: sober from from drugs   ?  ?Social History  ? ?Socioeconomic History  ? Marital status: Single  ?  Spouse name: Not on  file  ? Number of children: 1  ? Years of education: 45  ? Highest education level: 12th grade  ?Occupational History  ? Not on file  ?Tobacco Use  ? Smoking status: Every Day  ?  Packs/day: 0.50  ?  Years: 26.00  ?  Pack years: 13.00  ?  Types: Cigarettes  ? Smokeless tobacco: Never  ?Vaping Use  ? Vaping Use: Former  ?Substance and Sexual Activity  ? Alcohol use: Not Currently  ? Drug use: Not Currently  ?  Types: Marijuana  ?  Comment: sober from from drugs   ? Sexual activity: Not Currently  ?  Birth control/protection: None  ?Other Topics Concern  ? Not on file  ?Social History Narrative  ? ** Merged History Encounter **  ?    ? Lives with her fiance, friend, and fiance's brother  ? Right handed  ? ?Social Determinants of Health  ? ?Financial Resource Strain: Not on file  ?Food Insecurity: Not on file  ?Transportation Needs: Not on file  ?Physical Activity: Not on file  ?Stress: Not on file  ?Social Connections: Not on file  ? ?SDOH:  ?SDOH Screenings  ? ?Alcohol Screen: Low Risk   ? Last Alcohol Screening Score (AUDIT): 0  ?Depression (PHQ2-9): Medium Risk  ? PHQ-2 Score: 9  ?Financial Resource Strain: Not on file  ?Food Insecurity: Not on file  ?Housing: Not on file  ?Physical Activity: Not on file  ?Social Connections: Not on file  ?Stress: Not on file  ?Tobacco Use: High Risk  ? Smoking Tobacco Use: Every Day  ? Smokeless Tobacco Use: Never  ? Passive Exposure: Not on file  ?Transportation Needs: Not on file  ? ? ?Tobacco Cessation:  N/A, patient does not currently use tobacco products ? ?Current Medications:  ?Current Facility-Administered Medications  ?Medication Dose Route Frequency Provider Last Rate Last Admin  ? acetaminophen (TYLENOL) tablet 650 mg  650 mg Oral Q6H PRN Lucky Rathke, FNP      ? alum & mag hydroxide-simeth (MAALOX/MYLANTA) 200-200-20 MG/5ML suspension 30 mL  30 mL Oral Q4H PRN Lucky Rathke, FNP      ? ARIPiprazole (ABILIFY) tablet 20 mg  20 mg Oral Daily Lucky Rathke, FNP   20 mg at  11/04/21 4315  ? FLUoxetine (PROZAC) capsule 20 mg  20 mg Oral Daily Lucky Rathke, FNP   20 mg at 11/04/21 4008  ? hydrOXYzine (ATARAX) tablet 25 mg  25 mg Oral TID PRN Lucky Rathke, FNP      ? magnesium hydroxide (MILK OF MAGNESIA) suspension 30 mL  30 mL Oral Daily PRN Lucky Rathke, FNP      ? nicotine (NICODERM CQ - dosed  in mg/24 hours) patch 14 mg  14 mg Transdermal Daily Lucky Rathke, FNP   14 mg at 11/04/21 0908  ? traZODone (DESYREL) tablet 200 mg  200 mg Oral QHS Lucky Rathke, FNP   200 mg at 11/03/21 2107  ? ?Current Outpatient Medications  ?Medication Sig Dispense Refill  ? acetaminophen (TYLENOL) 500 MG tablet Take 500 mg by mouth every 6 (six) hours as needed (For rheumatoid arthritis and hip dysplasia).    ? ARIPiprazole (ABILIFY) 20 MG tablet Take 1 tablet (20 mg total) by mouth daily. 30 tablet 3  ? FLUoxetine (PROZAC) 20 MG capsule Take 1 capsule (20 mg total) by mouth daily. 30 capsule 3  ? hydrOXYzine (ATARAX) 25 MG tablet Take 1 tablet (25 mg total) by mouth 3 (three) times daily as needed for anxiety. 90 tablet 3  ? Menthol, Topical Analgesic, (ICY HOT BACK EX) Apply 1 application. topically as needed (For ankle pain).    ? traZODone (DESYREL) 100 MG tablet Take 2 tablets (200 mg total) by mouth at bedtime. 30 tablet 0  ? ? ?PTA Medications: (Not in a hospital admission) ? ? ?Musculoskeletal  ?Strength & Muscle Tone: within normal limits ?Gait & Station: normal ?Patient leans: N/A ? ?Psychiatric Specialty Exam  ?Presentation  ?General Appearance: Appropriate for Environment; Casual ? ?Eye Contact:Good ? ?Speech:Clear and Coherent; Normal Rate ? ?Speech Volume:Normal ? ?Handedness:Right ? ? ?Mood and Affect  ?Mood:Depressed ? ?Affect:Congruent; Depressed ? ? ?Thought Process  ?Thought Processes:Coherent; Goal Directed; Linear ? ?Descriptions of Associations:Intact ? ?Orientation:Full (Time, Place and Person) ? ?Thought Content:Logical; WDL ? Diagnosis of Schizophrenia or Schizoaffective  disorder in past: No ? Duration of Psychotic Symptoms: Greater than six months ? ? Hallucinations:Hallucinations: Visual; Auditory ?Description of Auditory Hallucinations: "I hear a voice saying that I a

## 2021-11-04 NOTE — Discharge Instructions (Signed)
Take all medications as prescribed. Keep all follow-up appointments as scheduled.  Do not consume alcohol or use illegal drugs while on prescription medications. Report any adverse effects from your medications to your primary care provider promptly.  In the event of recurrent symptoms or worsening symptoms, call 911, a crisis hotline, or go to the nearest emergency department for evaluation.   

## 2021-11-04 NOTE — ED Notes (Signed)
Lunch given.

## 2021-11-04 NOTE — ED Notes (Signed)
Patient A&O x 4, ambulatory. Patient discharged in no acute distress. Patient denied SI/HI, A/VH upon discharge. Patient verbalized understanding of all discharge instructions explained by staff, to include follow up appointments, RX's and safety plan. Pt belongings returned to patient from locker intact. Patient escorted to lobby via staff for transport to destination. Safety maintained.   

## 2021-11-04 NOTE — ED Notes (Signed)
Pt sleeping at present, no distress noted. Respirations even & unlabored.  Monitoring for safety. 

## 2021-11-04 NOTE — ED Notes (Signed)
Pt sleeping in no acute distress. RR even and unlabored. Safety maintained. 

## 2021-11-06 ENCOUNTER — Other Ambulatory Visit: Payer: Self-pay

## 2021-11-06 LAB — POCT PREGNANCY, URINE: Preg Test, Ur: NEGATIVE

## 2021-11-13 ENCOUNTER — Other Ambulatory Visit: Payer: Self-pay

## 2021-11-15 ENCOUNTER — Encounter (HOSPITAL_COMMUNITY): Payer: Self-pay

## 2021-11-15 ENCOUNTER — Ambulatory Visit (INDEPENDENT_AMBULATORY_CARE_PROVIDER_SITE_OTHER): Payer: Medicare (Managed Care) | Admitting: Licensed Clinical Social Worker

## 2021-11-15 DIAGNOSIS — F251 Schizoaffective disorder, depressive type: Secondary | ICD-10-CM

## 2021-11-15 NOTE — Plan of Care (Signed)
?  Problem: Depression ?Goal: STG: Reduce overall depression score by a minimum of 25% on the Patient Health Questionnaire (PHQ-9) or the Montgomery-Asberg Depression Rating Scale (MADRS) ?Outcome: Not Progressing ?  ?

## 2021-11-15 NOTE — Progress Notes (Signed)
? ?  THERAPIST PROGRESS NOTE ? ?Session Time: 40  ? ?Participation Level: Active ? ?Behavioral Response: CasualAlertAnxious and Depressed ? ?Type of Therapy: Individual Therapy ? ?Treatment Goals addressed: Decrease PHQ-9 below 10  ? ?ProgressTowards Goals: Progressing ? ?Interventions: CBT and Motivational Interviewing ? ?Summary: Milianna Ericsson is a 41 y.o. adult who presents with depressed and anxious mood\affect.  Patient was pleasant, cooperative, maintained good eye contact.  Engaged well in therapy session and was dressed casually. ? Patient reports primary stressors as grief\loss, financials and legal.  Patient reports that she is still dealing with a custody battle for her child against child protective services.  Patient reports that her child is still in the care of foster facility.  Patient states that her next court hearing is April 12.  Other stressors for patient are anniversary of her grandmother's death.  Patient reports that her grandmother was the primary person that raised him and she still continues to struggle with her death multiple years later.  Patient states that financials have been a struggle as her dog poured water on his laptop and now patient needs a new laptop. ? ?Suicidal/Homicidal: Nowithout intent/plan ? ?Therapist Response:  ? ? Intervention/Plan: Goals addressed in today's session were decreasing PHQ-9 25% below original score.  Patient decrease score in today's session from a 6 to a 3.  LCSW also administered GAD-7 patient increase score from a 5.  Patient continues to maintain attendance at at least 80% for individual therapy sessions.  Goal\objective is 80% or above.  LCSW utilized interventions for supportive therapy, person centered therapy, and cognitive behavioral therapy.  LCSW utilized education for GAD-7 and PHQ-9 scores.  LCSW educated patient on the importance of taking medications as directed.  Patient to follow-up in 4 ? ?Plan: Return again in 4 weeks. ? ?Diagnosis:  Schizoaffective disorder, depressive type ([place]) ? ?Collaboration of Care: Other None in todays session  ? ?Patient/Guardian was advised Release of Information must be obtained prior to any record release in order to collaborate their care with an outside provider. Patient/Guardian was advised if they have not already done so to contact the registration department to sign all necessary forms in order for [place] to release information regarding their care.  ? ?Consent: Patient/Guardian gives verbal consent for treatment and assignment of benefits for services provided during this visit. Patient/Guardian expressed understanding and agreed to proceed.  ? ?[name], LCSW ?11/15/2021 ? ?

## 2021-11-15 NOTE — Plan of Care (Signed)
?  Problem: Depression ?Goal: STG: Amanda "[name]" WILL COMPLETE AT LEAST 80% OF ASSIGNED HOMEWORK ?Outcome: Progressing ?Goal: STG: Reduce overall depression score by a minimum of 25% on the Patient Health Questionnaire (PHQ-9) or the Montgomery-Asberg Depression Rating Scale (MADRS) ?Outcome: Progressing ?Goal:   Walk 3 x weekly  ?Outcome: Progressing ?Goal:  Identify 3 trigger for depression and anxiety  ?Outcome: Progressing ?Goal: Decrease IP psych admission to less than 1 x per year.  ?Outcome: Progressing ?  ?

## 2021-12-13 ENCOUNTER — Ambulatory Visit (INDEPENDENT_AMBULATORY_CARE_PROVIDER_SITE_OTHER): Payer: Self-pay | Admitting: Licensed Clinical Social Worker

## 2021-12-13 DIAGNOSIS — F251 Schizoaffective disorder, depressive type: Secondary | ICD-10-CM

## 2021-12-13 DIAGNOSIS — F411 Generalized anxiety disorder: Secondary | ICD-10-CM

## 2021-12-13 NOTE — Progress Notes (Signed)
? ?  THERAPIST PROGRESS NOTE ? ?Session Time: 40 ? ?Participation Level: Active ? ?Behavioral Response: CasualAlertAnxious and Depressed ? ?Type of Therapy: Individual Therapy ? ?Treatment Goals addressed: Walk 3 x weekly  ? ?ProgressTowards Goals: Progressing ? ?Interventions: CBT and Solution Focused ? ?Summary: Amanda [name] is a 41 y.o. adult who presents with euthymic mood\affect.  Patient was pleasant, cooperative, maintained good eye contact.  She engaged well in therapy session and was dressed casually.  [name] was alert and oriented x5. ? Patient comes in today with primary stressors of housing, financials, and legal.  Patient reports that he needed a continuance for his ongoing custody case with [employer] for his son PJ.  Patient reports that he had a migraine at the time of his last court date and asked for continuance.  Patient reports the judge did grant a continuance and they are supposed to meet in June.  Patient reports that he has been dealing with getting furniture for his housing.  Patient has been going through a agency that supplies furniture for a low cost to no cost.  Patient states that he is supposed to be getting his fracture tomorrow.  Patient reports that he has been stressed about money as his dog dumped water on his laptop and he needed to get a new one for $210. ? ?Suicidal/Homicidal: Nowithout intent/plan ? ?Therapist Response:  ? ? Intervention/Plan: Interventions utilized in today's session were for GAD-7 and PHQ-9.  Patient was educated on GAD-7 and PHQ-9.  Patient continues to make progress on goals for walking 3 times weekly, and attending at least 80% of psycho therapy sessions.  LCSW utilized psychoanalytic therapy for talk therapy so that patient could express thoughts, feelings, and emotions.  LCSW utilized CBT therapy for education on signs and symptoms of schizoaffective disorder.  Patient continues to make progress with as he reports 0 suicidal ideations  in the past 60 days.  Plan for patient is to follow-up in 4 weeks. ? ?Plan: Return again in 4 weeks. ? ?Diagnosis: Schizoaffective disorder, depressive type ([place]) ? ?GAD (generalized anxiety disorder) ? ?Collaboration of Care: Other None in todays session.  ? ?Patient/Guardian was advised Release of Information must be obtained prior to any record release in order to collaborate their care with an outside provider. Patient/Guardian was advised if they have not already done so to contact the registration department to sign all necessary forms in order for [place] to release information regarding their care.  ? ?Consent: Patient/Guardian gives verbal consent for treatment and assignment of benefits for services provided during this visit. Patient/Guardian expressed understanding and agreed to proceed.  ? ?[name], LCSW ?12/13/2021 ? ?

## 2021-12-13 NOTE — Plan of Care (Signed)
?  Problem: Depression ?Goal: STG: [place] "[name]" WILL COMPLETE AT LEAST 80% OF ASSIGNED HOMEWORK ?Outcome: Progressing ?Goal: STG: Reduce overall depression score by a minimum of 25% on the Patient Health Questionnaire (PHQ-9) or the Montgomery-Asberg Depression Rating Scale (MADRS) ?Outcome: Completed/Met ?Goal:   Walk 3 x weekly  ?Outcome: Progressing ?Goal: Listening to classical or write 3 x weekly  ?Outcome: Progressing ?Goal:  Identify 3 trigger for depression and anxiety  ?Outcome: Progressing ?Goal: Decrease IP psych admission to less than 1 x per year.  ?Outcome: Progressing ?Intervention: WORK WITH Suzannah "[name]" TO TRACK SYMPTOMS, TRIGGERS AND/OR SKILL USE THROUGH A MOOD CHART, DIARY CARD, OR JOURNAL ?Intervention: PROVIDE [place] "[name]" WITH EDUCATIONAL INFORMATION AND READING MATERIAL ON DISSOCIATION, ITS CAUSES, AND SYMPTOMS ?Intervention: WORK WITH [place] "[name]" TO IDENTIFY THE MAJOR COMPONENTS OF A RECENT EPISODE OF DEPRESSION: PHYSICAL SYMPTOMS, MAJOR THOUGHTS AND IMAGES, AND MAJOR BEHAVIORS THEY EXPERIENCED ?  ?

## 2021-12-18 ENCOUNTER — Other Ambulatory Visit: Payer: Self-pay

## 2021-12-18 ENCOUNTER — Ambulatory Visit (INDEPENDENT_AMBULATORY_CARE_PROVIDER_SITE_OTHER): Payer: Self-pay | Admitting: Psychiatry

## 2021-12-18 ENCOUNTER — Encounter (HOSPITAL_COMMUNITY): Payer: Self-pay | Admitting: Psychiatry

## 2021-12-18 DIAGNOSIS — F251 Schizoaffective disorder, depressive type: Secondary | ICD-10-CM | POA: Diagnosis not present

## 2021-12-18 DIAGNOSIS — F411 Generalized anxiety disorder: Secondary | ICD-10-CM | POA: Diagnosis not present

## 2021-12-18 MED ORDER — TRAZODONE HCL 100 MG PO TABS
200.0000 mg | ORAL_TABLET | Freq: Every day | ORAL | 0 refills | Status: DC
  Filled 2021-12-18 – 2022-01-23 (×2): qty 30, 15d supply, fill #0

## 2021-12-18 MED ORDER — ARIPIPRAZOLE 20 MG PO TABS
20.0000 mg | ORAL_TABLET | Freq: Every day | ORAL | 2 refills | Status: DC
  Filled 2021-12-18 – 2022-01-23 (×2): qty 30, 30d supply, fill #0

## 2021-12-18 MED ORDER — HYDROXYZINE HCL 25 MG PO TABS
25.0000 mg | ORAL_TABLET | Freq: Three times a day (TID) | ORAL | 2 refills | Status: DC | PRN
  Filled 2021-12-18 – 2022-01-23 (×2): qty 90, 30d supply, fill #0

## 2021-12-18 MED ORDER — LORATADINE 10 MG PO TABS
10.0000 mg | ORAL_TABLET | Freq: Every day | ORAL | 2 refills | Status: DC
  Filled 2021-12-18: qty 30, 30d supply, fill #0

## 2021-12-18 MED ORDER — FLUOXETINE HCL 20 MG PO CAPS
20.0000 mg | ORAL_CAPSULE | Freq: Every day | ORAL | 2 refills | Status: DC
  Filled 2021-12-18 – 2022-01-23 (×2): qty 30, 30d supply, fill #0

## 2021-12-18 NOTE — Progress Notes (Signed)
BH MD/PA/NP OP Progress Note ? ?12/18/2021 11:24 AM ?Amanda [name]  ?MRN:  [id number] ? ?Chief Complaint:  ?Chief Complaint  ?Patient presents with  ? Medication Management  ?  in person,, f/u mm  ? ?HPI: Amanda [name] "Amanda [name]" is a 41 year old transgender female seen today for follow-up psychiatric evaluation.  He has a psychiatric history of anxiety, schizophrenia, schizoaffective disorder bipolar type, bipolar disorder, borderline personality disorder, OCD and PTSD.  He is currently managed on Prozac 20 mg daily, Abilify 20 mg daily, trazodone 200 mg nightly, NicoDerm CQ 14 mg patches daily and hydroxyzine 25 mg 3 times daily.  Patient reports medications are effective with managing symptoms and is medication compliant.  Patient denies adverse medication effects or need for dosage adjustment today. ? ?Patient is alert and oriented x4, calm, pleasant and willing to engage.  He appears appropriately dressed for the weather and well-groomed.  Patient reports good mood, appetite and diet.  Patient denies suicidal or homicidal ideations, paranoia, delusional thought, auditory or visual hallucinations. ? ? ?Visit Diagnosis:  ?  ICD-10-CM   ?1. Schizoaffective disorder, depressive type (HCC)  F25.1 ARIPiprazole (ABILIFY) 20 MG tablet  ?  FLUoxetine (PROZAC) 20 MG capsule  ?  ?2. GAD (generalized anxiety disorder)  F41.1 FLUoxetine (PROZAC) 20 MG capsule  ?  hydrOXYzine (ATARAX) 25 MG tablet  ?  ? ? ?Past Psychiatric History:  ? ?Past Medical History:  ?Past Medical History:  ?Diagnosis Date  ? Anxiety   ? Asthma   ? Asthma due to environmental allergies   ? Asthma due to seasonal allergies   ? Bipolar 1 disorder ([place])   ? Borderline personality disorder ([place])   ? Brain bleed ([place])   ? Chronic post-traumatic stress disorder (PTSD)   ? complex chronic with psychotic features and self harm behaviors  ? Complication of anesthesia   ? Constipation   ? Dander (animal) allergy   ? Hearing loss   ? right ear  ? Heart murmur   ? denies  seeing a cardiologist  ? Hip dysplasia   ? Hypertension   ? Gestional   ? Major depression, chronic   ? Mood disorder ([place])   ? OCD (obsessive compulsive disorder)   ? Pneumonia   ? PONV (postoperative nausea and vomiting)   ? PTSD (post-traumatic stress disorder)   ? RA (rheumatoid arthritis) ([place])   ? Rheumatoid arthritis ([place])   ? Schizophrenia ([place])   ? "I think that is wrong"  ? Seizure disorder ([place])   ? Suicidal ideations   ? Suicide attempt [facility])   ?  ?Past Surgical History:  ?Procedure Laterality Date  ? Brain Shunt  1981  ? "a few hours old"  ? EYE SURGERY Bilateral 1985  ? INTRAUTERINE DEVICE (IUD) INSERTION N/A 09/16/2018  ? Procedure: INTRAUTERINE DEVICE (IUD) INSERTION;  Surgeon: Lavonia Drafts, MD;  Location: [facility];  Service: Gynecology;  Laterality: N/A;  ? PERINEUM REPAIR N/A 09/16/2018  ? Procedure: EPISIOTOMY REVISION;  Surgeon: Lavonia Drafts, MD;  Location: [facility];  Service: Gynecology;  Laterality: N/A;  ? [place]  ? Tooth Removal    ? multiple  ? ? ?Family Psychiatric History: see below ? ?Family History:  ?Family History  ?Problem Relation Age of Onset  ? Heart attack Mother   ? Stroke Mother   ? Liver cancer Mother   ? Diabetes Mother   ? Lung cancer Mother   ? Alcoholism Father   ? Sleep apnea Brother   ?  Depression Brother   ? ADD / ADHD Brother   ? Diabetes Maternal Aunt   ? Diabetes Paternal Uncle   ? Colon cancer Paternal Uncle   ? Diabetes Maternal Grandmother   ? Dementia Maternal Grandmother   ? ? ?Social History:  ?Social History  ? ?Socioeconomic History  ? Marital status: Single  ?  Spouse name: Not on file  ? Number of children: 1  ? Years of education: 32  ? Highest education level: 12th grade  ?Occupational History  ? Not on file  ?Tobacco Use  ? Smoking status: Every Day  ?  Packs/day: 0.50  ?  Years: 26.00  ?  Pack years: 13.00  ?  Types: Cigarettes  ? Smokeless tobacco: Never  ?Vaping Use  ? Vaping Use: Former  ?Substance and Sexual Activity  ?  Alcohol use: Not Currently  ? Drug use: Not Currently  ?  Types: Marijuana  ?  Comment: sober from from drugs   ? Sexual activity: Not Currently  ?  Birth control/protection: None  ?Other Topics Concern  ? Not on file  ?Social History Narrative  ? ** Merged History Encounter **  ?    ? Lives with her fiance, friend, and fiance's brother  ? Right handed  ? ?Social Determinants of Health  ? ?Financial Resource Strain: Not on file  ?Food Insecurity: Not on file  ?Transportation Needs: Not on file  ?Physical Activity: Not on file  ?Stress: Not on file  ?Social Connections: Not on file  ? ? ?Allergies:  ?Allergies  ?Allergen Reactions  ? Aspartame And Phenylalanine Anaphylaxis, Hives, Diarrhea and Other (See Comments)  ?  Artifical sweetners - diarrhea  ? Benadryl [Diphenhydramine] Anaphylaxis, Diarrhea and Other (See Comments)  ?  Blisters  ? Other Anaphylaxis, Nausea And Vomiting, Rash and Other (See Comments)  ?  Aspartame- Blisters ?Dust- Worsens asthma ?Ragweed- Worsens asthma, face gets red, and sneezing ?Animal Fur/Dander- Worsens asthma and sneezing ? ?  ? Scallops [Shellfish Allergy] Anaphylaxis, Diarrhea and Nausea And Vomiting  ? Yellow Jacket Venom [Bee Venom] Anaphylaxis, Diarrhea and Nausea And Vomiting  ?  Seizures and numbness  ? Pollen Extract Other (See Comments)  ?  Runny nose, eyes, and asthma worsens  ? Tegretol [Carbamazepine] Hives, Diarrhea and Other (See Comments)  ?  Blisters in mouth and increase in seizures  ? Adhesive [Tape] Rash  ? Latex Hives, Rash and Other (See Comments)  ?  Blisters, also- Condoms and dental encounters  ? ? ?Metabolic Disorder Labs: ?Lab Results  ?Component Value Date  ? HGBA1C 5.2 07/26/2021  ? MPG 102.54 07/26/2021  ? MPG 111 12/03/2020  ? ?Lab Results  ?Component Value Date  ? PROLACTIN 15.3 11/03/2021  ? PROLACTIN 39.6 (H) 07/26/2021  ? ?Lab Results  ?Component Value Date  ? CHOL 207 (H) 11/03/2021  ? TRIG 85 11/03/2021  ? HDL 47 11/03/2021  ? CHOLHDL 4.4 11/03/2021   ? VLDL 17 11/03/2021  ? LDLCALC 143 (H) 11/03/2021  ? LDLCALC 102 (H) 07/26/2021  ? ?Lab Results  ?Component Value Date  ? TSH 1.703 11/03/2021  ? TSH 2.220 07/26/2021  ? ? ?Therapeutic Level Labs: ?No results found for: LITHIUM ?No results found for: VALPROATE ?No components found for:  CBMZ ? ?Current Medications: ?Current Outpatient Medications  ?Medication Sig Dispense Refill  ? acetaminophen (TYLENOL) 500 MG tablet Take 500 mg by mouth every 6 (six) hours as needed (For rheumatoid arthritis and hip dysplasia).    ?  ARIPiprazole (ABILIFY) 20 MG tablet Take 1 tablet (20 mg total) by mouth daily. 30 tablet 2  ? FLUoxetine (PROZAC) 20 MG capsule Take 1 capsule (20 mg total) by mouth daily. 30 capsule 2  ? hydrOXYzine (ATARAX) 25 MG tablet Take 1 tablet (25 mg total) by mouth 3 (three) times daily as needed for anxiety. 90 tablet 2  ? Menthol, Topical Analgesic, (ICY HOT BACK EX) Apply 1 application. topically as needed (For ankle pain).    ? traZODone (DESYREL) 100 MG tablet Take 2 tablets (200 mg total) by mouth at bedtime. 30 tablet 0  ? ?No current facility-administered medications for this visit.  ? ? ? ?Musculoskeletal: ?Strength & Muscle Tone: within normal limits ?Gait & Station: normal ?Patient leans: N/A ? ?Psychiatric Specialty Exam: ?Review of Systems  ?Psychiatric/Behavioral:  Negative for hallucinations, self-injury and suicidal ideas.   ?All other systems reviewed and are negative.  ?There were no vitals taken for this visit.There is no height or weight on file to calculate BMI.  ?General Appearance: Well Groomed  ?Eye Contact:  Good  ?Speech:  Clear and Coherent  ?Volume:  Normal  ?Mood:  Euthymic  ?Affect:  Congruent  ?Thought Process:  Goal Directed  ?Orientation:  Full (Time, Place, and Person)  ?Thought Content: Logical   ?Suicidal Thoughts:  No  ?Homicidal Thoughts:  No  ?Memory:   good  ?Judgement:  Good  ?Insight:  Good  ?Psychomotor Activity:  Negative  ?Concentration:  Concentration: Good   ?Recall:  Good  ?Fund of Knowledge: Good  ?Language: Good  ?Akathisia:  Negative  ?Handed:  Right  ?AIMS (if indicated): not done  ?Assets:  Communication Skills ?Desire for Improvement  ?ADL's:  Nichola Sizer

## 2021-12-19 ENCOUNTER — Encounter (HOSPITAL_COMMUNITY): Payer: Medicare (Managed Care) | Admitting: Psychiatry

## 2021-12-25 ENCOUNTER — Other Ambulatory Visit: Payer: Self-pay

## 2022-01-02 ENCOUNTER — Ambulatory Visit (INDEPENDENT_AMBULATORY_CARE_PROVIDER_SITE_OTHER): Payer: Self-pay | Admitting: Licensed Clinical Social Worker

## 2022-01-02 DIAGNOSIS — F411 Generalized anxiety disorder: Secondary | ICD-10-CM

## 2022-01-02 DIAGNOSIS — F251 Schizoaffective disorder, depressive type: Secondary | ICD-10-CM | POA: Diagnosis not present

## 2022-01-02 NOTE — Progress Notes (Signed)
? ?  THERAPIST PROGRESS NOTE ? ?Session Time: 45  ? ?Participation Level: Active ? ?Behavioral Response: CasualAlertAnxious and Depressed ? ?Type of Therapy: Individual Therapy ? ?Treatment Goals addressed: walking 3 x weekly ? ?ProgressTowards Goals: Progressing ? ?Interventions: CBT and Motivational Interviewing ? ?Summary: Amanda [name] is a 41 y.o. adult who presents with euthymic mood\affect.  Patient was pleasant, cooperative, maintained good eye contact.  He engaged well in therapy session was dressed casually. ? Patient comes in today with primary stressors of legal.  Patient reports an ongoing custody case with Department of Social Services for custody of his son.  Patient reports that he is still undetermined if he wants to go through with open ended adoption or wants to go for full custody of his son.  Patient reports constant as mental health, inpatient admissions, and triggers such as thunderstorms and people fighting around him.  Pros are patient's continue compliance with medication management, compliance with therapy, and utilizing coping skills such as writing and drawing.  Patient reports that she has obtained furniture to fill her apartment after being homeless for over 1 year less than 6 months ago. ? ?Suicidal/Homicidal: Nowithout intent/plan ? ?Therapist Response:  ? ? Interventions/Plan: Interventions utilized in today's session were for cognitive behavioral therapy education provided for "tips for healthy boundaries".  Education material was provided to patient in today's session this went over things such as know your limits, no your values, listen to your emotions, have self-respect, have respect for others, be assertive, and consider the [facility].  [name] continues to make progress as evidenced by treatment plan no PHQ or GAD-7 was administered in today's session.  Patient does deny suicidal or homicidal ideations.  ?  ? ?Plan: Return again in 4 weeks. ? ?Diagnosis: No diagnosis  found. ? ?Collaboration of Care: Other none today  ? ?Patient/Guardian was advised Release of Information must be obtained prior to any record release in order to collaborate their care with an outside provider. Patient/Guardian was advised if they have not already done so to contact the registration department to sign all necessary forms in order for [place] to release information regarding their care.  ? ?Consent: Patient/Guardian gives verbal consent for treatment and assignment of benefits for services provided during this visit. Patient/Guardian expressed understanding and agreed to proceed.  ? ?[name], LCSW ?01/02/2022 ? ?

## 2022-01-02 NOTE — Plan of Care (Signed)
?  Problem: Depression ?Goal: STG: Amanda "[name]" WILL COMPLETE AT LEAST 80% OF ASSIGNED HOMEWORK ?Outcome: Progressing ?Goal:   Walk 3 x weekly  ?Outcome: Progressing ?Goal: Listening to classical or write 3 x weekly  ?Outcome: Progressing ?Goal:  Identify 3 trigger for depression and anxiety  ?Outcome: Progressing ?Goal: Decrease IP psych admission to less than 1 x per year.  ?Outcome: Progressing ?  ?Problem: Depression ?Intervention: ENCOURAGE [name] TO PARTICIPATE IN RECOVERY [employer]ER SUPPORT ACTIVITIES WEEKLY ?  ?

## 2022-01-22 ENCOUNTER — Encounter (HOSPITAL_COMMUNITY): Payer: Self-pay | Admitting: Emergency Medicine

## 2022-01-22 ENCOUNTER — Emergency Department (HOSPITAL_COMMUNITY): Payer: Medicare (Managed Care)

## 2022-01-22 ENCOUNTER — Emergency Department (HOSPITAL_COMMUNITY)
Admission: EM | Admit: 2022-01-22 | Discharge: 2022-01-22 | Disposition: A | Discharge status: Home / Self Care | Payer: Medicare (Managed Care) | Attending: Emergency Medicine | Admitting: Emergency Medicine

## 2022-01-22 DIAGNOSIS — R531 Weakness: Secondary | ICD-10-CM | POA: Diagnosis not present

## 2022-01-22 DIAGNOSIS — M25551 Pain in right hip: Secondary | ICD-10-CM | POA: Insufficient documentation

## 2022-01-22 DIAGNOSIS — J45909 Unspecified asthma, uncomplicated: Secondary | ICD-10-CM | POA: Insufficient documentation

## 2022-01-22 DIAGNOSIS — R200 Anesthesia of skin: Secondary | ICD-10-CM | POA: Diagnosis not present

## 2022-01-22 DIAGNOSIS — D72829 Elevated white blood cell count, unspecified: Secondary | ICD-10-CM | POA: Diagnosis not present

## 2022-01-22 DIAGNOSIS — W19XXXA Unspecified fall, initial encounter: Secondary | ICD-10-CM | POA: Diagnosis not present

## 2022-01-22 DIAGNOSIS — I10 Essential (primary) hypertension: Secondary | ICD-10-CM | POA: Diagnosis not present

## 2022-01-22 DIAGNOSIS — Z91040 Latex allergy status: Secondary | ICD-10-CM | POA: Insufficient documentation

## 2022-01-22 DIAGNOSIS — M25552 Pain in left hip: Secondary | ICD-10-CM | POA: Diagnosis not present

## 2022-01-22 LAB — CBC WITH DIFFERENTIAL/PLATELET
Abs Immature Granulocytes: 0.06 10*3/uL (ref 0.00–0.07)
Basophils Absolute: 0.1 10*3/uL (ref 0.0–0.1)
Basophils Relative: 1 %
Eosinophils Absolute: 0.2 10*3/uL (ref 0.0–0.5)
Eosinophils Relative: 1 %
HCT: 49.5 % — ABNORMAL HIGH (ref 36.0–46.0)
Hemoglobin: 16.9 g/dL — ABNORMAL HIGH (ref 12.0–15.0)
Immature Granulocytes: 1 %
Lymphocytes Relative: 19 %
Lymphs Abs: 2.5 10*3/uL (ref 0.7–4.0)
MCH: 31.8 pg (ref 26.0–34.0)
MCHC: 34.1 g/dL (ref 30.0–36.0)
MCV: 93 fL (ref 80.0–100.0)
Monocytes Absolute: 0.7 10*3/uL (ref 0.1–1.0)
Monocytes Relative: 6 %
Neutro Abs: 9.5 10*3/uL — ABNORMAL HIGH (ref 1.7–7.7)
Neutrophils Relative %: 72 %
Platelets: 244 10*3/uL (ref 150–400)
RBC: 5.32 MIL/uL — ABNORMAL HIGH (ref 3.87–5.11)
RDW: 12.8 % (ref 11.5–15.5)
WBC: 13.1 10*3/uL — ABNORMAL HIGH (ref 4.0–10.5)
nRBC: 0 % (ref 0.0–0.2)

## 2022-01-22 LAB — COMPREHENSIVE METABOLIC PANEL
ALT: 10 U/L (ref 0–44)
AST: 12 U/L — ABNORMAL LOW (ref 15–41)
Albumin: 4 g/dL (ref 3.5–5.0)
Alkaline Phosphatase: 72 U/L (ref 38–126)
Anion gap: 7 (ref 5–15)
BUN: 13 mg/dL (ref 6–20)
CO2: 23 mmol/L (ref 22–32)
Calcium: 9.1 mg/dL (ref 8.9–10.3)
Chloride: 107 mmol/L (ref 98–111)
Creatinine, Ser: 0.8 mg/dL (ref 0.44–1.00)
GFR, Estimated: >60 mL/min (ref >60)
Glucose, Bld: 94 mg/dL (ref 70–99)
Potassium: 4.3 mmol/L (ref 3.5–5.1)
Sodium: 137 mmol/L (ref 135–145)
Total Bilirubin: 0.6 mg/dL (ref 0.3–1.2)
Total Protein: 6.9 g/dL (ref 6.5–8.1)

## 2022-01-22 MED ORDER — IBUPROFEN 600 MG PO TABS
600.0000 mg | ORAL_TABLET | Freq: Four times a day (QID) | ORAL | 0 refills | Status: DC | PRN
  Filled 2022-01-22: qty 30, 8d supply, fill #0

## 2022-01-22 NOTE — ED Provider Triage Note (Signed)
Emergency Medicine Provider Triage Evaluation Note  Amanda [name] , a 41 y.o. adult  was evaluated in triage.  Pt complains of pain right hip and dizziness.  Pt fell on Friday and hit hip   Review of Systems  Positive: Pain right hip Negative: fever  Physical Exam  BP 116/70 (BP Location: Right Arm)   Pulse 64   Temp 98.8 F (37.1 C) (Oral)   Resp 20   SpO2 97%  Gen:   Awake, no distress   Resp:  Normal effort  MSK:   Moves extremities without difficulty  Other:  Tender right hip  Medical Decision Making  Medically screening exam initiated at 1:19 PM.  Appropriate orders placed.  Amanda [name] was informed that the remainder of the evaluation will be completed by another provider, this initial triage assessment does not replace that evaluation, and the importance of remaining in the ED until their evaluation is complete.     Fransico Meadow, [place] 01/22/22 1320

## 2022-01-22 NOTE — ED Provider Notes (Signed)
[facility] EMERGENCY DEPARTMENT Provider Note   CSN: [id number] Arrival date & time: 01/22/22  1212     History  Chief Complaint  Patient presents with   Amanda [name] is a 41 y.o. adult.   Fall Pertinent negatives include no chest pain, no abdominal pain and no shortness of breath.   41 year old biological female presents emergency department 2-day history of right hip pain.  The patient states that she has difficulty walking at baseline and ambulates with a cane.  The patient states she falls regularly secondary to childhood development of her hip.  The patient secondarily endorses weakness and numbness in that leg.  The patient states that she has had weakness and numbness for several years secondary to known "bulging disc."  The patient has refused surgery in the past for this condition, but states she would "like outpatient orthopedic information to follow-up with when she is discharged."  The patient stated weakness and numbness is no different than her baseline.  The patient denies fever, saddle anesthesia, bowel/bladder dysfunction, cancer diagnosis, recent steroid use, recent IV drug use.  She notes history of IV drug use 7 years ago. Denies fever, chills, night sweats, chest pain, shortness of breath, abdominal pain, N/V/D, urinary/vaginal symptoms, change in bowel habits.  Denies trauma to head during fall, or current blood thinner use.  Past Medical History:  Diagnosis Date   Anxiety    Asthma    Asthma due to environmental allergies    Asthma due to seasonal allergies    Bipolar 1 disorder (HCC)    Borderline personality disorder ([place])    Brain bleed (HCC)    Chronic post-traumatic stress disorder (PTSD)    complex chronic with psychotic features and self harm behaviors   Complication of anesthesia    Constipation    Dander (animal) allergy    Hearing loss    right ear   Heart murmur    denies seeing a cardiologist   Hip dysplasia     Hypertension    Gestional    Major depression, chronic    Mood disorder (HCC)    OCD (obsessive compulsive disorder)    Pneumonia    PONV (postoperative nausea and vomiting)    PTSD (post-traumatic stress disorder)    RA (rheumatoid arthritis) ([place])    Rheumatoid arthritis ([place])    Schizophrenia ([place])    "I think that is wrong"   Seizure disorder ([place])    Suicidal ideations    Suicide attempt [facility])    Past Surgical History:  Procedure Laterality Date   Brain Shunt  1981   "a few hours old"   EYE SURGERY Bilateral [place] (IUD) INSERTION N/A 09/16/2018   Procedure: INTRAUTERINE DEVICE (IUD) INSERTION;  Surgeon: Lavonia Drafts, MD;  Location: [facility];  Service: Gynecology;  Laterality: N/A;   PERINEUM REPAIR N/A 09/16/2018   Procedure: EPISIOTOMY REVISION;  Surgeon: Lavonia Drafts, MD;  Location: [facility];  Service: Gynecology;  Laterality: N/A;   SHUNT REMOVAL  1983   Tooth Removal     multiple      Home Medications Prior to Admission medications   Medication Sig Start Date End Date Taking? Authorizing Provider  ibuprofen (ADVIL) 600 MG tablet Take 1 tablet (600 mg total) by mouth every 6 (six) hours as needed. 01/22/22  Yes Dion Saucier A, PA  acetaminophen (TYLENOL) 500 MG tablet Take 500 mg by mouth every 6 (six) hours as needed (  For rheumatoid arthritis and hip dysplasia).    [provider]  ARIPiprazole (ABILIFY) 20 MG tablet Take 1 tablet (20 mg total) by mouth daily. 12/18/21   Penn, Lunette Stands, NP  FLUoxetine (PROZAC) 20 MG capsule Take 1 capsule (20 mg total) by mouth daily. 12/18/21   Penn, Lunette Stands, NP  hydrOXYzine (ATARAX) 25 MG tablet Take 1 tablet (25 mg total) by mouth 3 (three) times daily as needed for anxiety. 12/18/21   Penn, Lunette Stands, NP  Menthol, Topical Analgesic, (ICY HOT BACK EX) Apply 1 application. topically as needed (For ankle pain).    [provider]  traZODone (DESYREL) 100 MG tablet Take 2 tablets (200 mg  total) by mouth at bedtime. 12/18/21   Penn, Lunette Stands, NP      Allergies    Aspartame and phenylalanine, Benadryl [diphenhydramine], Other, Scallops [shellfish allergy], Yellow jacket venom [bee venom], Pollen extract, Tegretol [carbamazepine], Adhesive [tape], and Latex    Review of Systems   Review of Systems  Constitutional:  Negative for chills and fever.  HENT:  Negative for ear pain and sore throat.   Eyes:  Negative for pain and visual disturbance.  Respiratory:  Negative for cough and shortness of breath.   Cardiovascular:  Negative for chest pain and palpitations.  Gastrointestinal:  Negative for abdominal pain and vomiting.  Genitourinary:  Negative for dysuria and hematuria.  Musculoskeletal:  Positive for arthralgias. Negative for back pain.  Skin:  Negative for color change and rash.  Neurological:  Positive for weakness and numbness. Negative for seizures and syncope.  All other systems reviewed and are negative.  Physical Exam Updated Vital Signs BP 96/72   Pulse (!) 59   Temp 98.7 F (37.1 C) (Oral)   Resp 14   SpO2 100%  Physical Exam Vitals and nursing note reviewed.  Constitutional:      General: He is not in acute distress.    Appearance: He is well-developed.  HENT:     Head: Normocephalic and atraumatic.     Nose: Nose normal.     Mouth/Throat:     Mouth: Mucous membranes are moist.     Pharynx: Oropharynx is clear.  Eyes:     Extraocular Movements: Extraocular movements intact.     Conjunctiva/sclera: Conjunctivae normal.  Cardiovascular:     Rate and Rhythm: Normal rate and regular rhythm.  Pulmonary:     Effort: Pulmonary effort is normal. No respiratory distress.     Breath sounds: Normal breath sounds.  Abdominal:     General: Abdomen is flat.     Palpations: Abdomen is soft.     Tenderness: There is no abdominal tenderness. There is no guarding.  Musculoskeletal:        General: Tenderness present. No swelling. Normal range of motion.      Cervical back: Normal range of motion and neck supple.     Right hip: Tenderness present.     Left hip: Normal.     Right lower leg: Normal. No swelling. No edema.     Left lower leg: Normal. No swelling. No edema.     Comments: Patient has full range of motion of bilateral hip without pain.  Pain is elicited with resisted flexion and extension.  Tenderness to palpation of proximal hip-difficult to ascertain bony versus muscular in origin secondary to body habitus.  Posterior tibial pulses full and intact bilaterally.  Straight leg raise negative.  At the moment, complain no sensory deficits along major nerve distributions of upper  and lower extremities.  Muscle strength is equal bilaterally for hip flexion, extension, knee flexion/extension, dorsi/plantarflexion of bilateral ankles.  Patient able to ambulate but with pain.  Lower extremity DTRs symmetric and equal  Skin:    General: Skin is warm and dry.     Capillary Refill: Capillary refill takes less than 2 seconds.  Neurological:     General: No focal deficit present.     Mental Status: He is alert and oriented to person, place, and time.  Psychiatric:        Mood and Affect: Mood normal.    ED Results / Procedures / Treatments   Labs (all labs ordered are listed, but only abnormal results are displayed) Labs Reviewed  CBC WITH DIFFERENTIAL/PLATELET - Abnormal; Notable for the following components:      Result Value   WBC 13.1 (*)    RBC 5.32 (*)    Hemoglobin 16.9 (*)    HCT 49.5 (*)    Neutro Abs 9.5 (*)    All other components within normal limits  COMPREHENSIVE METABOLIC PANEL - Abnormal; Notable for the following components:   AST 12 (*)    All other components within normal limits    EKG EKG Interpretation  Date/Time:  Monday January 22 2022 12:52:21 EDT Ventricular Rate:  59 PR Interval:  132 QRS Duration: 90 QT Interval:  394 QTC Calculation: 390 R Axis:   86 Text Interpretation: Sinus bradycardia Otherwise normal  ECG When compared with ECG of 03-Nov-2021 14:50, PREVIOUS ECG IS PRESENT when compared to prior,  similar to prior. No STEMI Confirmed by Antony Blackbird [phone]) on 01/22/2022 6:23:33 PM  Radiology DG Hip Unilat W or Wo Pelvis 2-3 Views Right  Result Date: 01/22/2022 CLINICAL DATA:  Fall, right leg weakness EXAM: DG HIP (WITH OR WITHOUT PELVIS) 2-3V RIGHT COMPARISON:  Radiograph 03/13/2020 FINDINGS: There is no evidence of acute fracture. Alignment is normal. There is no significant arthritis. There is an IUD overlying the pelvis. IMPRESSION: No acute osseous abnormality. Electronically Signed   By: Maurine Simmering M.D.   On: 01/22/2022 13:48    Procedures Procedures    Medications Ordered in ED Medications - No data to display  ED Course/ Medical Decision Making/ A&P                           Medical Decision Making Risk Prescription drug management.   This patient presents to the ED for concern of right hip pain, this involves an extensive number of treatment options, and is a complaint that carries with it a high risk of complications and morbidity.  The differential diagnosis includes fracture, contusion, trochanteric bursitis, lumbar fracture, pelvis fracture, spinal epidural abscess, cauda equina   Co morbidities that complicate the patient evaluation  Anxiety, asthma, bipolar 1, borderline personality disorder, PTSD, hip dysplasia, hypertension, MDD, OCD, schizophrenia, seizure disorder   Additional history obtained:  Additional history obtained from friend who is at bedside External records from outside source obtained and reviewed including lumbar spine x-ray from 03/13/2020 -mild degenerative changes of L2-L3   Lab Tests:  I Ordered, and personally interpreted labs.  The pertinent results include: White count of 13.1, hemoglobin of 16.9.  Patient has baseline elevation of white count and hemoglobin as seen on prior labs.   Imaging Studies ordered:  I ordered imaging  studies including AP lateral with pelvis right I independently visualized and interpreted imaging which showed no acute osseous abnormality  I agree with the radiologist interpretation   Cardiac Monitoring: / EKG:  The patient was maintained on a cardiac monitor.  I personally viewed and interpreted the cardiac monitored which showed an underlying rhythm of: Sinus rhythm   Consultations Obtained:  N/a   Problem List / ED Course / Critical interventions / Medication management  Right hip pain Reevaluation of the patient  showed that the patient improved I have reviewed the patients home medicines and have made adjustments as needed   Test / Admission - Considered:  Right hip pain Vitals signs within normal range and stable throughout visit. Laboratory/imaging studies significant for: No acute abnormalities Physical exam shows no true strength deficits in lower extremities.  Patient eliciting no sensory deficits at this moment.  Reassured with overall work-up and patient deemed safe for discharge. Given patient's chronic nature of symptoms as well as recurrent falls without fracture, patient recommended close follow-up outpatient with Ortho.  I discussed at length regarding consulting proper specialties while in the hospital, but she denied and preferred outpatient treatment.  Symptomatic therapy with rest, ice, elevation, NSAIDs.  Patient was warned regarding use of fluoxetine and ibuprofen as increasing GI bleed risk.  She expressed understanding and agreed with therapy to watch for said symptoms.  Information for outpatient orthopedics f/u provided to further assess and evaluate her symptoms.  I mentioned that neurosurgery be better further management of her symptoms, but she denied wanting to see neurosurgery in preference for orthopedics. Worrisome signs and symptoms were discussed with the patient, and the patient acknowledged understanding to return to the ED if noticed. Patient was  stable upon discharge.          Final Clinical Impression(s) / ED Diagnoses Final diagnoses:  Left hip pain    Rx / DC Orders ED Discharge Orders          Ordered    ibuprofen (ADVIL) 600 MG tablet  Every 6 hours PRN        01/22/22 1843              Wilnette Kales, PA 01/22/22 1920    Tegeler, Gwenyth Allegra, MD 01/22/22 2147

## 2022-01-22 NOTE — Discharge Instructions (Addendum)
Please rest, ice, elevate affected it.  Take the ibuprofen that I have prescribed every 6 hours over the next 5 to 7 days and gauge response.  Note that taking ibuprofen along with your fluoxetine increases the risk of GI bleed.  Look for bright red blood in stools/black tarry stools.  This may indicate you have a GI bleed and need to stop.  If you do not feel comfortable taking both these medications at the same time, you can take Tylenol instead of ibuprofen. I will provide information to call orthopedics in the area to establish care given your chronic multiple joint issues.  Please do not hesitate to return to the emergency department if the worrisome signs and symptoms we discussed become apparent.

## 2022-01-22 NOTE — ED Notes (Signed)
Discharge instructions reviewed with patient. Patient verbalized understanding of instructions. Follow-up care and medications were reviewed. Patient ambulatory with steady gait. VSS upon discharge.  ?

## 2022-01-22 NOTE — ED Triage Notes (Signed)
Patient here for evaluation of right leg weakness that started last Friday after getting dizzy and falling. Patient is alert, oriented, and in no apparent distress at this time. No arm drift, no dysarthria, no facial droop.

## 2022-01-23 ENCOUNTER — Other Ambulatory Visit: Payer: Self-pay

## 2022-01-29 ENCOUNTER — Other Ambulatory Visit: Payer: Self-pay

## 2022-02-07 ENCOUNTER — Ambulatory Visit (INDEPENDENT_AMBULATORY_CARE_PROVIDER_SITE_OTHER): Payer: Self-pay | Admitting: Licensed Clinical Social Worker

## 2022-02-07 DIAGNOSIS — F251 Schizoaffective disorder, depressive type: Secondary | ICD-10-CM | POA: Diagnosis not present

## 2022-02-07 NOTE — Plan of Care (Signed)
  Problem: Depression Goal: STG: [name] WILL COMPLETE AT LEAST 80% OF ASSIGNED HOMEWORK Outcome: Progressing Goal:   Walk 3 x weekly  Outcome: Progressing Goal: Listening to classical or write 3 x weekly  Outcome: Progressing Goal:  Identify 3 trigger for depression and anxiety  Outcome: Progressing Goal: Decrease IP psych admission to less than 1 x per year.  Outcome: Progressing   

## 2022-02-07 NOTE — Progress Notes (Signed)
   THERAPIST PROGRESS NOTE  Session Time: 30  Participation Level: Active  Behavioral Response: CasualAlertAnxious and Depressed  Type of Therapy: Individual Therapy  Treatment Goals addressed: Amanda "[name]" WILL COMPLETE AT LEAST 80% OF ASSIGNED HOMEWORK  ProgressTowards Goals: Progressing  Interventions: CBT and Motivational Interviewing  Summary: Amanda [name] is a 41 y.o. adult who presents with depressed and anxious mood\affect.  Patient was pleasant, cooperative, maintained good eye contact.  [name] was alert and oriented x5.  He engaged well in therapy session.  Patient reports primary stressors as legal.  Patient reports that he found out this past month that Department of Social Services will be moving forward with adoption for his son PJ.  Patient has been working towards reunification.  Patient reports compliance with housing, mental health, and parenting classes.  Patient reports he has discussed voluntarily putting his son up for adoption but was not fully decided on this over the past 90 days.  Patient reports that he has been having reoccurring nightmares and lack of sleep due to these nightmares fearing that his son will not "love him knowing that I put him up for adoption".  Suicidal/Homicidal: Nowithout intent/plan  Therapist Response:    Intervention/Plan: LCSW worked with patient on image rehearsal therapy.  LCSW and patient work for 15 minutes on a narrative outcome that patient would like nightmares to turn into.  LCSW provided patient with task of writing this narrative down.  Patient utilized the example of open adoption, PJ being adopted by foster family that currently has custody of PJ, and seeing PJ every other weekend.  Patient to practice this rehearsal therapy at home for at least 15 to 20 minutes.  Plan: Return again in 4 weeks.  Diagnosis: Schizoaffective disorder, depressive type ([place])  Collaboration of Care: Other None in today's session.    Patient/Guardian was advised Release of Information must be obtained prior to any record release in order to collaborate their care with an outside provider. Patient/Guardian was advised if they have not already done so to contact the registration department to sign all necessary forms in order for [place] to release information regarding their care.   Consent: Patient/Guardian gives verbal consent for treatment and assignment of benefits for services provided during this visit. Patient/Guardian expressed understanding and agreed to proceed.   [name], LCSW 02/07/2022

## 2022-03-14 ENCOUNTER — Ambulatory Visit (INDEPENDENT_AMBULATORY_CARE_PROVIDER_SITE_OTHER): Payer: Self-pay | Admitting: Licensed Clinical Social Worker

## 2022-03-14 DIAGNOSIS — F251 Schizoaffective disorder, depressive type: Secondary | ICD-10-CM

## 2022-03-14 DIAGNOSIS — F411 Generalized anxiety disorder: Secondary | ICD-10-CM

## 2022-03-14 NOTE — Progress Notes (Signed)
   THERAPIST PROGRESS NOTE  Session Time: 36  Participation Level: Active  Behavioral Response: CasualAlertAnxious and Depressed  Type of Therapy: Individual Therapy  Treatment Goals addressed: Walk 3 x weekly  ProgressTowards Goals: Progressing  Interventions: CBT, Motivational Interviewing, and Supportive  Summary: Amanda [name] is a 41 y.o. adult who presents with depressed and anxious mood\affect.  Patient was pleasant, cooperative, maintained good eye contact.  He engaged well in therapy session was dressed casually.  Patient was alert and oriented x5.  Patient comes in today with primary stressors of a legal and boundary setting.  Patient reports that he is still having a legal custody battle with Department of Social Services in [place] for his son.  Patient reports that his son is still in the foster care system.  Patient reports stress and tension as he went to his visitation last week Friday and no one showed up.  Patient reports that he took steps to report this to a lawyer and to the case manager through [facility].  Other stressors for [name] are boundary setting.  Patient reports that a friend that he has known his wife kept on calling him due to relationship problems.  Patient reports that he attempted to hang up the phone multiple times because he was getting frustrated but that person kept on calling back.  [name] reports that he kept answering the phone.  Patient reports that he was attempting to roll a cigarette after the situation and happened and after 1 hour of not being able to roll that cigarette he threw the cigarette machine and broke it.  Patient reports he is not a cigarette machine and needs to get a new one when he receives his next Social Security check.  Suicidal/Homicidal: Nowithout intent/plan  Therapist Response:    Intervention/Plan: LCSW spoke with patient about "tips for setting healthy boundaries".  LCSW spoke with patient about "knowing limits".  LCSW spoke  with patient "listen to your emotions".  LCSW spoke with patient about triggers and understanding precursors to tension buildup causing anxiety.  LCSW used supportive language for praise and encouragement.  LCSW used open-ended questions and reflective listening for motivational interviewing.  Plan: Return again in 3 weeks.  Diagnosis: No diagnosis found.  Collaboration of Care: Other None reported today   Patient/Guardian was advised Release of Information must be obtained prior to any record release in order to collaborate their care with an outside provider. Patient/Guardian was advised if they have not already done so to contact the registration department to sign all necessary forms in order for [place] to release information regarding their care.   Consent: Patient/Guardian gives verbal consent for treatment and assignment of benefits for services provided during this visit. Patient/Guardian expressed understanding and agreed to proceed.   [name], LCSW 03/14/2022

## 2022-03-19 ENCOUNTER — Encounter (HOSPITAL_COMMUNITY): Payer: Medicare (Managed Care) | Admitting: Psychiatry

## 2022-03-22 ENCOUNTER — Encounter (HOSPITAL_COMMUNITY): Payer: Self-pay | Admitting: Psychiatry

## 2022-03-22 ENCOUNTER — Telehealth (INDEPENDENT_AMBULATORY_CARE_PROVIDER_SITE_OTHER): Payer: Medicare (Managed Care) | Admitting: Psychiatry

## 2022-03-22 ENCOUNTER — Other Ambulatory Visit: Payer: Self-pay

## 2022-03-22 DIAGNOSIS — F251 Schizoaffective disorder, depressive type: Secondary | ICD-10-CM

## 2022-03-22 DIAGNOSIS — F411 Generalized anxiety disorder: Secondary | ICD-10-CM | POA: Diagnosis not present

## 2022-03-22 MED ORDER — TRAZODONE HCL 100 MG PO TABS
200.0000 mg | ORAL_TABLET | Freq: Every day | ORAL | 3 refills | Status: DC
  Filled 2022-03-22: qty 60, 30d supply, fill #0

## 2022-03-22 MED ORDER — HYDROXYZINE HCL 25 MG PO TABS
25.0000 mg | ORAL_TABLET | Freq: Three times a day (TID) | ORAL | 3 refills | Status: DC | PRN
  Filled 2022-03-22: qty 90, 30d supply, fill #0

## 2022-03-22 MED ORDER — ARIPIPRAZOLE 20 MG PO TABS
20.0000 mg | ORAL_TABLET | Freq: Every day | ORAL | 3 refills | Status: DC
  Filled 2022-03-22: qty 30, 30d supply, fill #0

## 2022-03-22 MED ORDER — FLUOXETINE HCL 20 MG PO CAPS
20.0000 mg | ORAL_CAPSULE | Freq: Every day | ORAL | 3 refills | Status: DC
  Filled 2022-03-22: qty 30, 30d supply, fill #0

## 2022-03-22 NOTE — Progress Notes (Signed)
[place] MD/PA/NP OP Progress Note Virtual Visit via Telephone Note  I connected with Amanda [name] on 03/22/22 at 11:00 AM EDT by telephone and verified that I am speaking with the correct person using two identifiers.  Location: Patient: home Provider: Clinic   I discussed the limitations, risks, security and privacy concerns of performing an evaluation and management service by telephone and the availability of in person appointments. I also discussed with the patient that there may be a patient responsible charge related to this service. The patient expressed understanding and agreed to proceed.   I provided 30 minutes of non-face-to-face time during this encounter.  03/22/2022 10:59 AM Amanda [name]  MRN:  [id number]  Chief Complaint: "Right now I'm good"  HPI: 41 year old transgender female seen today for follow up psychiatric evaluation.    He has a psychiatric history of anxiety, schizophrenia, schizoaffective disorder bipolar type, bipolar 1, borderline personality, depression OCD, SI, and PTSD.  He is currently managed on Prozac 20 mg daily, Abilify 20 mg daily, trazodone 200 mg nightly, and hydroxyzine 25 mg 3 times daily.   Today he notes his medications are effective in managing his psychiatric condition.    Today patient was unable to login virtually so his exam was done on the phone. During exam he was pleasant he pleasant, cooperative,and  engaged in conversation. He notes that he has been doing good since his last visit.  He informed Probation officer that yesterday he was at a custody hearing for his 61-year-old son which he notes was continued.  He informed Probation officer that his son may be given up for adoption due to his mental and physical state.  He informed Probation officer that he believes that this is what is best for his child.  He informed Probation officer that his mood is stable and reports that he has very minimal anxiety and depression.  Patient requested to do a PHQ-9 and a GAD-7 at his next visit.  Today he  denies SI/HI/AVH, mania, or paranoia.   Patient reports that a few weeks ago he fell and hurt his right hip.  He informed Probation officer that Tylenol is somewhat effective in managing his pain.   No medication changes made today.  Patient agreeable to continue medication as prescribed.  He will follow-up with outpatient counseling for therapy.  No other concerns at this time.    Visit Diagnosis:    ICD-10-CM   1. Schizoaffective disorder, depressive type ([place])  F25.1 ARIPiprazole (ABILIFY) 20 MG tablet    FLUoxetine (PROZAC) 20 MG capsule    traZODone (DESYREL) 100 MG tablet    2. GAD (generalized anxiety disorder)  F41.1 FLUoxetine (PROZAC) 20 MG capsule    hydrOXYzine (ATARAX) 25 MG tablet    traZODone (DESYREL) 100 MG tablet       Past Psychiatric History: anxiety, schizophrenia, schizoaffective disorder bipolar type, bipolar 1, borderline personality, depression OCD, SI, and PTSD.  Past Medical History:  Past Medical History:  Diagnosis Date   Anxiety    Asthma    Asthma due to environmental allergies    Asthma due to seasonal allergies    Bipolar 1 disorder (HCC)    Borderline personality disorder ([place])    Brain bleed (HCC)    Chronic post-traumatic stress disorder (PTSD)    complex chronic with psychotic features and self harm behaviors   Complication of anesthesia    Constipation    Dander (animal) allergy    Hearing loss    right ear   Heart  murmur    denies seeing a cardiologist   Hip dysplasia    Hypertension    Gestional    Major depression, chronic    Mood disorder (HCC)    OCD (obsessive compulsive disorder)    Pneumonia    PONV (postoperative nausea and vomiting)    PTSD (post-traumatic stress disorder)    RA (rheumatoid arthritis) ([place])    Rheumatoid arthritis ([place])    Schizophrenia ([place])    "I think that is wrong"   Seizure disorder ([place])    Suicidal ideations    Suicide attempt [facility])     Past Surgical History:  Procedure Laterality Date   Brain  Shunt  1981   "a few hours old"   EYE SURGERY Bilateral [place] (IUD) INSERTION N/A 09/16/2018   Procedure: INTRAUTERINE DEVICE (IUD) INSERTION;  Surgeon: Lavonia Drafts, MD;  Location: [facility];  Service: Gynecology;  Laterality: N/A;   PERINEUM REPAIR N/A 09/16/2018   Procedure: EPISIOTOMY REVISION;  Surgeon: Lavonia Drafts, MD;  Location: [facility];  Service: Gynecology;  Laterality: N/A;   SHUNT REMOVAL  1983   Tooth Removal     multiple    Family Psychiatric History:  Father alcohol use  Family History:  Family History  Problem Relation Age of Onset   Heart attack Mother    Stroke Mother    Liver cancer Mother    Diabetes Mother    Lung cancer Mother    Alcoholism Father    Sleep apnea Brother    Depression Brother    ADD / ADHD Brother    Diabetes Maternal Aunt    Diabetes Paternal Uncle    Colon cancer Paternal Uncle    Diabetes Maternal Grandmother    Dementia Maternal Grandmother     Social History:  Social History   Socioeconomic History   Marital status: Single    Spouse name: Not on file   Number of children: 1   Years of education: 12   Highest education level: 12th grade  Occupational History   Not on file  Tobacco Use   Smoking status: Every Day    Packs/day: 0.50    Years: 26.00    Total pack years: 13.00    Types: Cigarettes   Smokeless tobacco: Never  Vaping Use   Vaping Use: Former  Substance and Sexual Activity   Alcohol use: Not Currently   Drug use: Not Currently    Types: Marijuana    Comment: sober from from drugs    Sexual activity: Not Currently    Birth control/protection: None  Other Topics Concern   Not on file  Social History Narrative   ** Merged History Encounter **       Lives with her fiance, friend, and fiance's brother   Right handed   Social Determinants of Health   Financial Resource Strain: Low Risk  (07/01/2020)   Overall Financial Resource Strain (CARDIA)    Difficulty of  Paying Living Expenses: Not hard at all  Food Insecurity: No Food Insecurity (07/01/2020)   Hunger Vital Sign    Worried About Running Out of Food in the Last Year: Never true    Ran Out of Food in the Last Year: Never true  Transportation Needs: Unmet Transportation Needs (07/01/2020)   PRAPARE - Transportation    Lack of Transportation (Medical): Yes    Lack of Transportation (Non-Medical): Yes  Physical Activity: Insufficiently Active (07/01/2020)   Exercise Vital Sign    Days  of Exercise per Week: 3 days    Minutes of Exercise per Session: 40 min  Stress: No Stress Concern Present (07/01/2020)   [place]    Feeling of Stress : Not at all  Social Connections: Moderately Integrated (07/01/2020)   Social Connection and Isolation Panel [NHANES]    Frequency of Communication with Friends and Family: Three times a week    Frequency of Social Gatherings with Friends and Family: More than three times a week    Attends Religious Services: Never    Marine scientist or Organizations: Yes    Attends Music therapist: More than 4 times per year    Marital Status: Married    Allergies:  Allergies  Allergen Reactions   Aspartame And Phenylalanine Anaphylaxis, Hives, Diarrhea and Other (See Comments)    Artifical sweetners - diarrhea   Benadryl [Diphenhydramine] Anaphylaxis, Diarrhea and Other (See Comments)    Blisters   Other Anaphylaxis, Nausea And Vomiting, Rash and Other (See Comments)    Aspartame- Blisters Dust- Worsens asthma Ragweed- Worsens asthma, face gets red, and sneezing Animal Fur/Dander- Worsens asthma and sneezing     Scallops [Shellfish Allergy] Anaphylaxis, Diarrhea and Nausea And Vomiting   Yellow Jacket Venom [Bee Venom] Anaphylaxis, Diarrhea and Nausea And Vomiting    Seizures and numbness   Pollen Extract Other (See Comments)    Runny nose, eyes, and asthma worsens    Tegretol [Carbamazepine] Hives, Diarrhea and Other (See Comments)    Blisters in mouth and increase in seizures   Adhesive [Tape] Rash   Latex Hives, Rash and Other (See Comments)    Blisters, also- Condoms and dental encounters    Metabolic Disorder Labs: Lab Results  Component Value Date   HGBA1C 5.2 07/26/2021   MPG 102.54 07/26/2021   MPG 111 12/03/2020   Lab Results  Component Value Date   PROLACTIN 15.3 11/03/2021   PROLACTIN 39.6 (H) 07/26/2021   Lab Results  Component Value Date   CHOL 207 (H) 11/03/2021   TRIG 85 11/03/2021   HDL 47 11/03/2021   CHOLHDL 4.4 11/03/2021   VLDL 17 11/03/2021   LDLCALC 143 (H) 11/03/2021   LDLCALC 102 (H) 07/26/2021   Lab Results  Component Value Date   TSH 1.703 11/03/2021   TSH 2.220 07/26/2021    Therapeutic Level Labs: No results found for: "LITHIUM" No results found for: "VALPROATE" No results found for: "CBMZ"  Current Medications: Current Outpatient Medications  Medication Sig Dispense Refill   acetaminophen (TYLENOL) 500 MG tablet Take 500 mg by mouth every 6 (six) hours as needed (For rheumatoid arthritis and hip dysplasia).     ARIPiprazole (ABILIFY) 20 MG tablet Take 1 tablet (20 mg total) by mouth daily. 30 tablet 3   FLUoxetine (PROZAC) 20 MG capsule Take 1 capsule (20 mg total) by mouth daily. 30 capsule 3   hydrOXYzine (ATARAX) 25 MG tablet Take 1 tablet (25 mg total) by mouth 3 (three) times daily as needed for anxiety. 90 tablet 3   ibuprofen (ADVIL) 600 MG tablet Take 1 tablet (600 mg total) by mouth every 6 (six) hours as needed. 30 tablet 0   Menthol, Topical Analgesic, (ICY HOT BACK EX) Apply 1 application. topically as needed (For ankle pain).     traZODone (DESYREL) 100 MG tablet Take 2 tablets (200 mg total) by mouth at bedtime. 60 tablet 3   No current facility-administered medications for this visit.  Musculoskeletal: Strength & Muscle Tone:  Unable to assess due to telephone visit Gait &  Station:  Unable to assess due to telephone visit Patient leans: N/A  Psychiatric Specialty Exam: Review of Systems  There were no vitals taken for this visit.There is no height or weight on file to calculate BMI.  General Appearance:  Unable to assess due to telephone visit  Eye Contact:  Unable to assess due to telephone visit  Speech:  Clear and Coherent and Normal Rate  Volume:  Normal  Mood:  Euthymic  Affect:  Appropriate and Congruent  Thought Process:  Coherent, Goal Directed, and Linear  Orientation:  Full (Time, Place, and Person)  Thought Content: WDL and Logical   Suicidal Thoughts:  No  Homicidal Thoughts:  No  Memory:  Immediate;   Good Recent;   Good Remote;   Good  Judgement:  Good  Insight:  Good  Psychomotor Activity:   Unable to assess due to telephone visit  Concentration:  Concentration: Good and Attention Span: Good  Recall:  Good  Fund of Knowledge: Good  Language: Good  Akathisia:   Unable to assess due to telephone visit  Handed:  Right  AIMS (if indicated): not done  Assets:  Communication Skills Desire for Improvement Financial Resources/Insurance Housing Leisure Time  ADL's:  Intact  Cognition: WNL  Sleep:  Good   Screenings: AIMS    Flowsheet Row Admission (Discharged) from 07/27/2021 in [facility] Admission (Discharged) from 11/30/2020 in [facility] Admission (Discharged) from 11/01/2019 in [facility] Admission (Discharged) from 11/10/2018 in [place] [facility]  AIMS Total Score 0 0 0 0      AUDIT    Flowsheet Row Admission (Discharged) from 07/27/2021 in [facility] [facility] Admission (Discharged) from 11/01/2019 in [facility] Admission (Discharged) from 11/10/2018 in [facility] [facility]  Alcohol Use Disorder Identification Test  Final Score (AUDIT) 0 5 0      GAD-7    Flowsheet Row Counselor from 12/13/2021 in [facility] Counselor from 11/15/2021 in [facility] Counselor from 10/17/2021 in [facility] Office Visit from 10/02/2021 in [place] Video Visit from 08/16/2021 in [facility]  Total GAD-7 Score '2 5 2 '$ 0 0      Mini-Mental    [place] Office Visit from 09/28/2019 in [facility]  Total Score (max 30 points ) 17      PHQ2-9    Flowsheet Row Counselor from 12/13/2021 in [facility] Counselor from 11/15/2021 in [facility] ED from 11/03/2021 in [facility] Counselor from 10/17/2021 in [facility] Office Visit from 10/02/2021 in [place]  PHQ-2 Total Score 0 1 2 0 0  PHQ-9 Total Score '2 3 9 6 '$ 0      Flowsheet Row ED from 01/22/2022 in [place] Counselor from 11/15/2021 in [facility] ED from 11/03/2021 in [place] No Risk Error: Q3, 4, or 5 should not be populated when Q2 is No High Risk        Assessment and Plan: Patient notes that he is doing well on his medication. No  medication changes made today. Patient agreeable to continue medications as prescribed.  1. Schizoaffective disorder, depressive type (HCC)  Continue- ARIPiprazole (ABILIFY) 20 MG tablet; Take 1 tablet (20 mg total) by mouth daily.  Dispense: 30 tablet; Refill: 3 Continue- FLUoxetine (PROZAC) 20 MG capsule; Take 1 capsule (20 mg total) by mouth daily.  Dispense: 30 capsule; Refill: 3 Continue- traZODone (DESYREL) 100 MG tablet; Take 2 tablets (200 mg total) by mouth at bedtime.  Dispense: 60 tablet; Refill:  3  2. GAD (generalized anxiety disorder)  Continue- FLUoxetine (PROZAC) 20 MG capsule; Take 1 capsule (20 mg total) by mouth daily.  Dispense: 30 capsule; Refill: 3 Continue- hydrOXYzine (ATARAX) 25 MG tablet; Take 1 tablet (25 mg total) by mouth 3 (three) times daily as needed for anxiety.  Dispense: 90 tablet; Refill: 3 Continue- traZODone (DESYREL) 100 MG tablet; Take 2 tablets (200 mg total) by mouth at bedtime.  Dispense: 60 tablet; Refill: 3     Follow up in 3 months    Salley Slaughter, NP 03/22/2022, 10:59 AM

## 2022-03-28 ENCOUNTER — Other Ambulatory Visit: Payer: Self-pay

## 2022-04-03 ENCOUNTER — Ambulatory Visit (HOSPITAL_COMMUNITY)
Admission: EM | Admit: 2022-04-03 | Discharge: 2022-04-04 | Disposition: A | Discharge status: Other Acute Inpatient Hospital | Payer: Medicare (Managed Care) | Attending: Registered Nurse | Admitting: Registered Nurse

## 2022-04-03 ENCOUNTER — Ambulatory Visit (INDEPENDENT_AMBULATORY_CARE_PROVIDER_SITE_OTHER): Payer: Medicare (Managed Care) | Admitting: Licensed Clinical Social Worker

## 2022-04-03 ENCOUNTER — Encounter (HOSPITAL_COMMUNITY): Payer: Self-pay | Admitting: Registered Nurse

## 2022-04-03 DIAGNOSIS — F603 Borderline personality disorder: Secondary | ICD-10-CM

## 2022-04-03 DIAGNOSIS — F251 Schizoaffective disorder, depressive type: Secondary | ICD-10-CM | POA: Diagnosis present

## 2022-04-03 DIAGNOSIS — Z20822 Contact with and (suspected) exposure to covid-19: Secondary | ICD-10-CM | POA: Diagnosis not present

## 2022-04-03 DIAGNOSIS — Z9151 Personal history of suicidal behavior: Secondary | ICD-10-CM | POA: Diagnosis not present

## 2022-04-03 DIAGNOSIS — F429 Obsessive-compulsive disorder, unspecified: Secondary | ICD-10-CM | POA: Diagnosis not present

## 2022-04-03 DIAGNOSIS — F250 Schizoaffective disorder, bipolar type: Secondary | ICD-10-CM | POA: Insufficient documentation

## 2022-04-03 DIAGNOSIS — Z888 Allergy status to other drugs, medicaments and biological substances status: Secondary | ICD-10-CM | POA: Diagnosis not present

## 2022-04-03 DIAGNOSIS — F4310 Post-traumatic stress disorder, unspecified: Secondary | ICD-10-CM | POA: Diagnosis not present

## 2022-04-03 DIAGNOSIS — F411 Generalized anxiety disorder: Secondary | ICD-10-CM | POA: Diagnosis not present

## 2022-04-03 DIAGNOSIS — R45851 Suicidal ideations: Secondary | ICD-10-CM | POA: Insufficient documentation

## 2022-04-03 LAB — HEMOGLOBIN A1C
Hgb A1c MFr Bld: 5 % (ref 4.8–5.6)
Mean Plasma Glucose: 96.8 mg/dL

## 2022-04-03 LAB — COMPREHENSIVE METABOLIC PANEL
ALT: 9 U/L (ref 0–44)
AST: 23 U/L (ref 15–41)
Albumin: 4.5 g/dL (ref 3.5–5.0)
Alkaline Phosphatase: 90 U/L (ref 38–126)
Anion gap: 9 (ref 5–15)
BUN: 13 mg/dL (ref 6–20)
CO2: 26 mmol/L (ref 22–32)
Calcium: 9.9 mg/dL (ref 8.9–10.3)
Chloride: 105 mmol/L (ref 98–111)
Creatinine, Ser: 0.79 mg/dL (ref 0.44–1.00)
GFR, Estimated: >60 mL/min (ref >60)
Glucose, Bld: 75 mg/dL (ref 70–99)
Potassium: 5.3 mmol/L — ABNORMAL HIGH (ref 3.5–5.1)
Sodium: 140 mmol/L (ref 135–145)
Total Bilirubin: 1.5 mg/dL — ABNORMAL HIGH (ref 0.3–1.2)
Total Protein: 7.3 g/dL (ref 6.5–8.1)

## 2022-04-03 LAB — RESP PANEL BY RT-PCR (FLU A&B, COVID) ARPGX2
Influenza A by PCR: NEGATIVE
Influenza B by PCR: NEGATIVE
SARS Coronavirus 2 by RT PCR: NEGATIVE

## 2022-04-03 LAB — URINALYSIS, ROUTINE W REFLEX MICROSCOPIC
Bilirubin Urine: NEGATIVE
Glucose, UA: NEGATIVE mg/dL
Ketones, ur: NEGATIVE mg/dL
Nitrite: NEGATIVE
Protein, ur: NEGATIVE mg/dL
Specific Gravity, Urine: 1.021 (ref 1.005–1.030)
pH: 5 (ref 5.0–8.0)

## 2022-04-03 LAB — POCT URINE DRUG SCREEN - MANUAL ENTRY (I-SCREEN)
POC Amphetamine UR: NOT DETECTED
POC Buprenorphine (BUP): NOT DETECTED
POC Cocaine UR: NOT DETECTED
POC Marijuana UR: NOT DETECTED
POC Methadone UR: NOT DETECTED
POC Methamphetamine UR: NOT DETECTED
POC Morphine: NOT DETECTED
POC Oxazepam (BZO): NOT DETECTED
POC Oxycodone UR: NOT DETECTED
POC Secobarbital (BAR): NOT DETECTED

## 2022-04-03 LAB — CBC WITH DIFFERENTIAL/PLATELET
Abs Immature Granulocytes: 0.04 10*3/uL (ref 0.00–0.07)
Basophils Absolute: 0.1 10*3/uL (ref 0.0–0.1)
Basophils Relative: 0 %
Eosinophils Absolute: 0.1 10*3/uL (ref 0.0–0.5)
Eosinophils Relative: 1 %
HCT: 48.1 % — ABNORMAL HIGH (ref 36.0–46.0)
Hemoglobin: 16.9 g/dL — ABNORMAL HIGH (ref 12.0–15.0)
Immature Granulocytes: 0 %
Lymphocytes Relative: 18 %
Lymphs Abs: 2.4 10*3/uL (ref 0.7–4.0)
MCH: 31.8 pg (ref 26.0–34.0)
MCHC: 35.1 g/dL (ref 30.0–36.0)
MCV: 90.4 fL (ref 80.0–100.0)
Monocytes Absolute: 0.5 10*3/uL (ref 0.1–1.0)
Monocytes Relative: 4 %
Neutro Abs: 10.6 10*3/uL — ABNORMAL HIGH (ref 1.7–7.7)
Neutrophils Relative %: 77 %
Platelets: 322 10*3/uL (ref 150–400)
RBC: 5.32 MIL/uL — ABNORMAL HIGH (ref 3.87–5.11)
RDW: 13.2 % (ref 11.5–15.5)
WBC: 13.7 10*3/uL — ABNORMAL HIGH (ref 4.0–10.5)
nRBC: 0 % (ref 0.0–0.2)

## 2022-04-03 LAB — MAGNESIUM: Magnesium: 2.1 mg/dL (ref 1.7–2.4)

## 2022-04-03 LAB — PREGNANCY, URINE: Preg Test, Ur: NEGATIVE

## 2022-04-03 LAB — POC SARS CORONAVIRUS 2 AG: SARSCOV2ONAVIRUS 2 AG: NEGATIVE

## 2022-04-03 LAB — ETHANOL: Alcohol, Ethyl (B): <10 mg/dL (ref <10)

## 2022-04-03 MED ORDER — ACETAMINOPHEN 325 MG PO TABS: 650.0000 mg | ORAL_TABLET | Freq: Four times a day (QID) | ORAL | Status: DC | PRN

## 2022-04-03 MED ORDER — MAGNESIUM HYDROXIDE 400 MG/5ML PO SUSP: 30.0000 mL | Freq: Every day | ORAL | Status: DC | PRN

## 2022-04-03 MED ORDER — ALUM & MAG HYDROXIDE-SIMETH 200-200-20 MG/5ML PO SUSP: 30.0000 mL | ORAL | Status: DC | PRN

## 2022-04-03 MED ORDER — TRAZODONE HCL 100 MG PO TABS
200.0000 mg | ORAL_TABLET | Freq: Every day | ORAL | Status: DC
  Administered 2022-04-03: 200 mg via ORAL
  Filled 2022-04-03: qty 2

## 2022-04-03 MED ORDER — HYDROXYZINE HCL 25 MG PO TABS: 25.0000 mg | ORAL_TABLET | Freq: Three times a day (TID) | ORAL | Status: DC | PRN

## 2022-04-03 MED ORDER — FLUOXETINE HCL 20 MG PO CAPS
20.0000 mg | ORAL_CAPSULE | Freq: Every day | ORAL | Status: DC
Start: 2022-04-04 — End: 2022-04-04
  Administered 2022-04-04: 20 mg via ORAL
  Filled 2022-04-03: qty 1

## 2022-04-03 MED ORDER — ARIPIPRAZOLE 10 MG PO TABS
20.0000 mg | ORAL_TABLET | Freq: Every day | ORAL | Status: DC
Start: 2022-04-04 — End: 2022-04-04
  Administered 2022-04-04: 20 mg via ORAL
  Filled 2022-04-03: qty 2

## 2022-04-03 NOTE — Discharge Instructions (Addendum)
  Outpatient psychiatric Services  Walk in hours for medication management Monday, Wednesday, Thursday, and Friday from 8:00 AM to 11:00 AM Recommend arriving by by 7:30 AM.  It is first come first serve.    Walk in hours for therapy intake Monday and Wednesday only 8:00 AM to 11:00 AM Encouraged to arrive by 7:30 AM.  It is first come first serve   Inpatient patient psychiatric services The Facility Based Crisis Unit offers comprehensive behavioral heath care services for mental health and substance abuse treatment.  Social work can also assist with referral to or getting you into a rehabilitation program short or long term  Continuous Assessment Unit 24 hour observation for safety and stabilization
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
## 2022-04-03 NOTE — Progress Notes (Signed)
Inpatient Behavioral Health Placement  Pt meets inpatient criteria per Earleen Newport, NP. There are no available beds at [facility] per [facility]. Referral was sent to the following facilities;    Destination Service Provider Address Phone Fax  [facility]  [address], [place] [address] [phone] [place]  [place], [place] [address] [phone]  [facility]  [place] [place] [address] [place]  [facility]  [address] [place] [address] [phone]  [facility]  [address] [place] [address] [phone]  [facility]  [address], [place] [address] [phone] [place]  [address], [place] [address] [phone] [facility]  [address] [place] [address] [phone]  [facility]  [address] [place] [address] [phone] [facility]  [address] [place] [address] [phone] [facility]  [address] [place] [address] [phone] [place]  [place], [place] [address] [place]  [facility]  [address] [place] [address] [phone]   Situation ongoing,  CSW will follow up.   Benjaman Kindler, MSW, [facility] 04/03/2022  @ 3:18 PM

## 2022-04-03 NOTE — ED Notes (Signed)
Pt laying in her bed calm and cooperative. No c/o pain or distress. Will continue to monitor for safety

## 2022-04-03 NOTE — ED Provider Notes (Signed)
[facility]gent [facility] Continuous Assessment Admission H&P  Date: 04/03/22 Patient Name: Amanda [name] MRN: [id number] Chief Complaint:  Chief Complaint  Patient presents with   Depression   Suicidal      Diagnoses:  Final diagnoses:  Schizoaffective disorder, depressive type ([place])  GAD (generalized anxiety disorder)  PTSD (post-traumatic stress disorder)  Borderline personality disorder ([place])    HPI: Amanda [name], 41 y.o., adult patient presents to [facility] after he was initially seen by his therapist in office today with complaints of suicidal ideation.   Patient seen face to face by this provider, consulted with Dr. Hampton Abbot; and chart reviewed on 04/03/22.  On evaluation Amanda [name] reports during his counseling session today he told his therapy he was having thought of killing himself.  Reported that he was having thoughts of taking an overdose of Benadryl which he is also allergic to.  States that he is also hearing voices telling him that he would be better off dead and that his son would be better off if he was dead.  Patient reports the stressor as being that he is in the process of son being adopted by foster parents.  States that foster parents has had his son since he was 36 wk old related to him being homeless and his mental health.  States that not knowing if it will be an open or closed adoption also bothers him.  Report that his son is well taken care of and the foster parents are good to him but just thinking about it all "I've been going through this process for 4 years and it gets really stressful at times.  Last night I got overwhelmed and I self-harmed last night.  My friend stopped me."  Patient reporting that he made cuts to his leg last night using a pocked knife.  Patient is unable to contract for safety at this time stating that if he was to go home today he feels that he will act on his suicidal thoughts.  Patient reports several prior suicide attempt.  He reports  his last psychiatric hospitalization was a year ago.  Patient reports that he currently has outpatient psychiatric services with [facility] seeing Alvera Singh for therapy monthly and Eulis Canner, NP Q 3 months for medication management.   Reports that he is compliant with medications (Prozac, Abilify, Trazodone, vistaril)  States he doesn't feel that his Trazodone 200 mg is working for him.  States that he lives alone with his dog and is unable to let anyone stay with him related to the housing program.  "If I let someone stay with me I can lose my housing."   States that he has a good support system but is unable to stay with family or friends related to them not having enough room.  States his primary support is his brother Shawn/his wife, "my ex-boyfriend, and my sister in [place]."    Reports he is unemployed on disability.   During evaluation Amanda [name] "[name]" is alert/oriented x 4; calm/cooperative; and mood is congruent with affect.  He does not appear to be responding to internal/external stimuli or delusional thoughts; but reports he is hearing voices.  He denies homicidal ideation, and paranoia but continues to endorse active suicidal ideation with plan and intent.  He is unable to contract for safety.  Admitted to continuous assessment unit for safety and stabilization.  Possible inpatient psychiatric treatment if no improvement.  PHQ 2-9:  [facility] ED from 04/03/2022 in [facility] Counselor from 12/13/2021 in [facility] Counselor from 11/15/2021 in [facility]  Thoughts that you would be better off dead, or of hurting yourself in some way Several days Not at all Not at all  PHQ-9 Total Score '5 2 3       '$ Flowsheet Row ED from 04/03/2022 in [facility] ED from 01/22/2022 in [facility] Counselor from  11/15/2021 in [facility] Error: Q7 should not be populated when Q6 is No No Risk Error: Q3, 4, or 5 should not be populated when Q2 is No        Total Time spent with patient: 30 minutes  Musculoskeletal  Strength & Muscle Tone: within normal limits Gait & Station: normal Patient leans: N/A  Psychiatric Specialty Exam  Presentation General Appearance: Appropriate for Environment; Disheveled  Eye Contact:Good  Speech:Clear and Coherent; Normal Rate  Speech Volume:Normal  Handedness:Right   Mood and Affect  Mood:Dysphoric  Affect:Congruent; Appropriate   Thought Process  Thought Processes:Coherent; Goal Directed  Descriptions of Associations:Intact  Orientation:Full (Time, Place and Person)  Thought Content:Logical  Diagnosis of Schizophrenia or Schizoaffective disorder in past: No  Duration of Psychotic Symptoms: Greater than six months  Hallucinations:Hallucinations: Auditory Description of Auditory Hallucinations: Reports he is hearing voices telling him that he would be better off dead, "Son would be better off without me if I was dead"  Ideas of Reference:None  Suicidal Thoughts:Suicidal Thoughts: Yes, Active SI Active Intent and/or Plan: With Intent; With Plan; With Means to [place]  Homicidal Thoughts:Homicidal Thoughts: No   Sensorium  Memory:Immediate Good; Recent Good; Remote Good  Judgment:Fair  Insight:Fair; Present   Executive Functions  Concentration:Good  Attention Span:Good  [place] of Knowledge:Good  Language:Good   Psychomotor Activity  Psychomotor Activity:Psychomotor Activity: Normal   Assets  Assets:Communication Skills; Desire for Improvement; Financial Resources/Insurance; Housing; Leisure Time; Resilience; Social Support   Sleep  Sleep:Sleep: Fair   Nutritional Assessment (For OBS and FBC admissions only) Has the patient had a weight loss or gain  of 10 pounds or more in the last 3 months?: No Has the patient had a decrease in food intake/or appetite?: No Does the patient have dental problems?: No Does the patient have eating habits or behaviors that may be indicators of an eating disorder including binging or inducing vomiting?: No Has the patient recently lost weight without trying?: 0 Has the patient been eating poorly because of a decreased appetite?: 0 Malnutrition Screening Tool Score: 0    Physical Exam Vitals and nursing note reviewed. Exam conducted with a chaperone present.  Constitutional:      General: He is not in acute distress.    Appearance: Normal appearance. He is not ill-appearing.  Cardiovascular:     Rate and Rhythm: Normal rate.  Pulmonary:     Effort: Pulmonary effort is normal.  Skin:    General: Skin is warm and dry.     Comments: Patient has several superficial lacerations bilateral anterior lower leg (below knee).  Neurological:     Mental Status: He is alert and oriented to person, place, and time.  Psychiatric:        Attention and Perception: Attention normal. He perceives auditory hallucinations.        Mood and Affect: Affect normal. Mood is depressed.  Speech: Speech normal.        Behavior: Behavior normal. Behavior is cooperative.        Thought Content: Thought content is not paranoid or delusional. Thought content includes suicidal ideation. Thought content does not include homicidal ideation. Thought content includes suicidal plan.        Cognition and Memory: Cognition and memory normal.        Judgment: Judgment is impulsive.    Review of Systems  Constitutional: Negative.   HENT:  Positive for hearing loss.        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  Wearing hearing aid   Eyes: Negative.   Respiratory: Negative.    Cardiovascular: Negative.   Gastrointestinal: Negative.   Genitourinary: Negative.   Skin:        Reports he self injured last  night using a pocket knife to make superficial cuts to lower leg.    Neurological: Negative.   Psychiatric/Behavioral:  Positive for depression, hallucinations (auditiry) and suicidal ideas. The patient is nervous/anxious and has insomnia.     Blood pressure 126/81, pulse 70, temperature 98.2 F (36.8 C), temperature source Oral, resp. rate 18, SpO2 99 %. There is no height or weight on file to calculate BMI.  Past Psychiatric History: psychiatric history of anxiety, schizophrenia, schizoaffective disorder bipolar type, bipolar 1, borderline personality, depression OCD, SI, and PTSD   Is the patient at risk to self? Yes  Has the patient been a risk to self in the past 6 months? No .    Has the patient been a risk to self within the distant past? Yes   Is the patient a risk to others? No   Has the patient been a risk to others in the past 6 months? No   Has the patient been a risk to others within the distant past? No   Past Medical History:  Past Medical History:  Diagnosis Date   Anxiety    Asthma    Asthma due to environmental allergies    Asthma due to seasonal allergies    Bipolar 1 disorder (HCC)    Borderline personality disorder ([place])    Brain bleed (HCC)    Chronic post-traumatic stress disorder (PTSD)    complex chronic with psychotic features and self harm behaviors   Complication of anesthesia    Constipation    Dander (animal) allergy    Hearing loss    right ear   Heart murmur    denies seeing a cardiologist   Hip dysplasia    Hypertension    Gestional    Major depression, chronic    Mood disorder (HCC)    OCD (obsessive compulsive disorder)    Pneumonia    PONV (postoperative nausea and vomiting)    PTSD (post-traumatic stress disorder)    RA (rheumatoid arthritis) ([place])    Rheumatoid arthritis ([place])    Schizophrenia ([place])    "I think that is wrong"   Seizure disorder ([place])    Suicidal ideations    Suicide attempt [facility])     Past Surgical History:   Procedure Laterality Date   Brain Shunt  1981   "a few hours old"   EYE SURGERY Bilateral [place] (IUD) INSERTION N/A 09/16/2018   Procedure: INTRAUTERINE DEVICE (IUD) INSERTION;  Surgeon: Lavonia Drafts, MD;  Location: [facility];  Service: Gynecology;  Laterality: N/A;   PERINEUM REPAIR N/A 09/16/2018   Procedure: EPISIOTOMY  REVISION;  Surgeon: Lavonia Drafts, MD;  Location: [facility];  Service: Gynecology;  Laterality: N/A;   SHUNT REMOVAL  1983   Tooth Removal     multiple    Family History:  Family History  Problem Relation Age of Onset   Heart attack Mother    Stroke Mother    Liver cancer Mother    Diabetes Mother    Lung cancer Mother    Alcoholism Father    Sleep apnea Brother    Depression Brother    ADD / ADHD Brother    Diabetes Maternal Aunt    Diabetes Paternal Uncle    Colon cancer Paternal Uncle    Diabetes Maternal Grandmother    Dementia Maternal Grandmother     Social History:  Social History   Socioeconomic History   Marital status: Single    Spouse name: Not on file   Number of children: 1   Years of education: 12   Highest education level: 12th grade  Occupational History   Not on file  Tobacco Use   Smoking status: Every Day    Packs/day: 0.50    Years: 26.00    Total pack years: 13.00    Types: Cigarettes   Smokeless tobacco: Never  Vaping Use   Vaping Use: Former  Substance and Sexual Activity   Alcohol use: Not Currently   Drug use: Not Currently    Types: Marijuana    Comment: sober from from drugs    Sexual activity: Not Currently    Birth control/protection: None  Other Topics Concern   Not on file  Social History Narrative   ** Merged History Encounter **       Lives with her fiance, friend, and fiance's brother   Right handed   Social Determinants of Health   Financial Resource Strain: Low Risk  (07/01/2020)   Overall Financial Resource Strain (CARDIA)    Difficulty of Paying Living  Expenses: Not hard at all  Food Insecurity: No Food Insecurity (07/01/2020)   Hunger Vital Sign    Worried About Running Out of Food in the Last Year: Never true    Ran Out of Food in the Last Year: Never true  Transportation Needs: Unmet Transportation Needs (07/01/2020)   PRAPARE - Transportation    Lack of Transportation (Medical): Yes    Lack of Transportation (Non-Medical): Yes  Physical Activity: Insufficiently Active (07/01/2020)   Exercise Vital Sign    Days of Exercise per Week: 3 days    Minutes of Exercise per Session: 40 min  Stress: No Stress Concern Present (07/01/2020)   [place] of Stress : Not at all  Social Connections: Moderately Integrated (07/01/2020)   Social Connection and Isolation Panel [NHANES]    Frequency of Communication with Friends and Family: Three times a week    Frequency of Social Gatherings with Friends and Family: More than three times a week    Attends Religious Services: Never    Marine scientist or Organizations: Yes    Attends Music therapist: More than 4 times per year    Marital Status: Married  Human resources officer Violence: Not At Risk (07/01/2020)   Humiliation, Afraid, Rape, and Kick questionnaire    Fear of Current or Ex-Partner: No    Emotionally Abused: No    Physically Abused: No    Sexually Abused: No    SDOH:  SDOH Screenings  Alcohol Screen: Low Risk  (07/27/2021)   Alcohol Screen    Last Alcohol Screening Score (AUDIT): 0  Depression (PHQ2-9): Low Risk  (12/13/2021)   Depression (PHQ2-9)    PHQ-2 Score: 2  Recent Concern: Depression (PHQ2-9) - Medium Risk (11/03/2021)   Depression (PHQ2-9)    PHQ-2 Score: 9  Financial Resource Strain: Low Risk  (07/01/2020)   Overall Financial Resource Strain (CARDIA)    Difficulty of Paying Living Expenses: Not hard at all  Food Insecurity: No Food Insecurity (07/01/2020)   Hunger Vital  Sign    Worried About Running Out of Food in the Last Year: Never true    [place] in the Last Year: Never true  Housing: High Risk (07/01/2020)   Housing    Last Housing Risk Score: 2  Physical Activity: Insufficiently Active (07/01/2020)   Exercise Vital Sign    Days of Exercise per Week: 3 days    Minutes of Exercise per Session: 40 min  Social Connections: Moderately Integrated (07/01/2020)   Social Connection and Isolation Panel [NHANES]    Frequency of Communication with Friends and Family: Three times a week    Frequency of Social Gatherings with Friends and Family: More than three times a week    Attends Religious Services: Never    Marine scientist or Organizations: Yes    Attends Music therapist: More than 4 times per year    Marital Status: Married  Stress: No Stress Concern Present (07/01/2020)   [employer] [place]    Feeling of Stress : Not at all  Tobacco Use: High Risk (04/03/2022)   Patient History    Smoking Tobacco Use: Every Day    Smokeless Tobacco Use: Never    Passive Exposure: Not on file  Transportation Needs: Unmet Transportation Needs (07/01/2020)   PRAPARE - Transportation    Lack of Transportation (Medical): Yes    Lack of Transportation (Non-Medical): Yes    Last Labs:  Admission on 01/22/2022, Discharged on 01/22/2022  Component Date Value Ref Range Status   WBC 01/22/2022 13.1 (H)  4.0 - 10.5 K/uL Final   RBC 01/22/2022 5.32 (H)  3.87 - 5.11 MIL/uL Final   Hemoglobin 01/22/2022 16.9 (H)  12.0 - 15.0 g/dL Final   HCT 01/22/2022 49.5 (H)  36.0 - 46.0 % Final   MCV 01/22/2022 93.0  80.0 - 100.0 fL Final   MCH 01/22/2022 31.8  26.0 - 34.0 pg Final   MCHC 01/22/2022 34.1  30.0 - 36.0 g/dL Final   RDW 01/22/2022 12.8  11.5 - 15.5 % Final   Platelets 01/22/2022 244  150 - 400 K/uL Final   nRBC 01/22/2022 0.0  0.0 - 0.2 % Final   Neutrophils Relative % 01/22/2022 72   % Final   Neutro Abs 01/22/2022 9.5 (H)  1.7 - 7.7 K/uL Final   Lymphocytes Relative 01/22/2022 19  % Final   Lymphs Abs 01/22/2022 2.5  0.7 - 4.0 K/uL Final   Monocytes Relative 01/22/2022 6  % Final   Monocytes Absolute 01/22/2022 0.7  0.1 - 1.0 K/uL Final   Eosinophils Relative 01/22/2022 1  % Final   Eosinophils Absolute 01/22/2022 0.2  0.0 - 0.5 K/uL Final   Basophils Relative 01/22/2022 1  % Final   Basophils Absolute 01/22/2022 0.1  0.0 - 0.1 K/uL Final   Immature Granulocytes 01/22/2022 1  % Final   Abs Immature Granulocytes 01/22/2022 0.06  0.00 -  0.07 K/uL Final   Performed at [facility], [place] [address] [place], [place] [address]   Sodium 01/22/2022 137  135 - 145 mmol/L Final   Potassium 01/22/2022 4.3  3.5 - 5.1 mmol/L Final   Chloride 01/22/2022 107  98 - 111 mmol/L Final   CO2 01/22/2022 23  22 - 32 mmol/L Final   Glucose, Bld 01/22/2022 94  70 - 99 mg/dL Final   Glucose reference range applies only to samples taken after fasting for at least 8 hours.   BUN 01/22/2022 13  6 - 20 mg/dL Final   Creatinine, Ser 01/22/2022 0.80  0.44 - 1.00 mg/dL Final   Calcium 01/22/2022 9.1  8.9 - 10.3 mg/dL Final   Total Protein 01/22/2022 6.9  6.5 - 8.1 g/dL Final   Albumin 01/22/2022 4.0  3.5 - 5.0 g/dL Final   AST 01/22/2022 12 (L)  15 - 41 U/L Final   ALT 01/22/2022 10  0 - 44 U/L Final   Alkaline Phosphatase 01/22/2022 72  38 - 126 U/L Final   Total Bilirubin 01/22/2022 0.6  0.3 - 1.2 mg/dL Final   GFR, Estimated 01/22/2022 >60  >60 mL/min Final   Comment: (NOTE) Calculated using the CKD-EPI Creatinine Equation (2021)    Anion gap 01/22/2022 7  5 - 15 Final   Performed at [facility], [place] [address], [place], [place] [address]  Admission on 11/03/2021, Discharged on 11/04/2021  Component Date Value Ref Range Status   SARS Coronavirus 2 by RT PCR 11/03/2021 NEGATIVE  NEGATIVE Final   Comment: (NOTE) SARS-CoV-2 target nucleic acids are NOT DETECTED.  The  SARS-CoV-2 RNA is generally detectable in upper respiratory specimens during the acute phase of infection. The lowest concentration of SARS-CoV-2 viral copies this assay can detect is 138 copies/mL. A negative result does not preclude SARS-Cov-2 infection and should not be used as the sole basis for treatment or other patient management decisions. A negative result may occur with  improper specimen collection/handling, submission of specimen other than nasopharyngeal swab, presence of viral mutation(s) within the areas targeted by this assay, and inadequate number of viral copies(<138 copies/mL). A negative result must be combined with clinical observations, patient history, and epidemiological information. The expected result is Negative.  Fact Sheet for Patients:  EntrepreneurPulse.com.au  Fact Sheet for Healthcare Providers:  IncredibleEmployment.be  This test is no                          t yet approved or cleared by the [place] FDA and  has been authorized for detection and/or diagnosis of SARS-CoV-2 by FDA under an Emergency Use Authorization (EUA). This EUA will remain  in effect (meaning this test can be used) for the duration of the COVID-19 declaration under Section 564(b)(1) of the Act, 21 U.S.C.section 360bbb-3(b)(1), unless the authorization is terminated  or revoked sooner.       Influenza A by PCR 11/03/2021 NEGATIVE  NEGATIVE Final   Influenza B by PCR 11/03/2021 NEGATIVE  NEGATIVE Final   Comment: (NOTE) The Xpert Xpress SARS-CoV-2/FLU/RSV plus assay is intended as an aid in the diagnosis of influenza from Nasopharyngeal swab specimens and should not be used as a sole basis for treatment. Nasal washings and aspirates are unacceptable for Xpert Xpress SARS-CoV-2/FLU/RSV testing.  Fact Sheet for Patients: EntrepreneurPulse.com.au  Fact Sheet for Healthcare  Providers: IncredibleEmployment.be  This test is not yet approved or cleared by the  [place] Architectural technologist and has been authorized for detection and/or diagnosis of SARS-CoV-2 by FDA under an Print production planner (EUA). This EUA will remain in effect (meaning this test can be used) for the duration of the COVID-19 declaration under Section 564(b)(1) of the Act, 21 U.S.C. section 360bbb-3(b)(1), unless the authorization is terminated or revoked.  Performed at [facility], [place] [address], [place], [place] [address]    WBC 11/03/2021 15.7 (H)  4.0 - 10.5 K/uL Final   RBC 11/03/2021 5.29 (H)  3.87 - 5.11 MIL/uL Final   Hemoglobin 11/03/2021 16.8 (H)  12.0 - 15.0 g/dL Final   HCT 11/03/2021 47.9 (H)  36.0 - 46.0 % Final   MCV 11/03/2021 90.5  80.0 - 100.0 fL Final   MCH 11/03/2021 31.8  26.0 - 34.0 pg Final   MCHC 11/03/2021 35.1  30.0 - 36.0 g/dL Final   RDW 11/03/2021 12.7  11.5 - 15.5 % Final   Platelets 11/03/2021 289  150 - 400 K/uL Final   nRBC 11/03/2021 0.0  0.0 - 0.2 % Final   Neutrophils Relative % 11/03/2021 78  % Final   Neutro Abs 11/03/2021 12.1 (H)  1.7 - 7.7 K/uL Final   Lymphocytes Relative 11/03/2021 16  % Final   Lymphs Abs 11/03/2021 2.6  0.7 - 4.0 K/uL Final   Monocytes Relative 11/03/2021 5  % Final   Monocytes Absolute 11/03/2021 0.9  0.1 - 1.0 K/uL Final   Eosinophils Relative 11/03/2021 0  % Final   Eosinophils Absolute 11/03/2021 0.1  0.0 - 0.5 K/uL Final   Basophils Relative 11/03/2021 1  % Final   Basophils Absolute 11/03/2021 0.1  0.0 - 0.1 K/uL Final   Immature Granulocytes 11/03/2021 0  % Final   Abs Immature Granulocytes 11/03/2021 0.07  0.00 - 0.07 K/uL Final   Performed at [facility], [place] [address] [place], [place] [address]   Sodium 11/03/2021 138  135 - 145 mmol/L Final   Potassium 11/03/2021 4.4  3.5 - 5.1 mmol/L Final   Chloride 11/03/2021 104  98 - 111 mmol/L Final   CO2 11/03/2021 24  22 - 32 mmol/L Final    Glucose, Bld 11/03/2021 81  70 - 99 mg/dL Final   Glucose reference range applies only to samples taken after fasting for at least 8 hours.   BUN 11/03/2021 10  6 - 20 mg/dL Final   Creatinine, Ser 11/03/2021 0.85  0.44 - 1.00 mg/dL Final   Calcium 11/03/2021 9.2  8.9 - 10.3 mg/dL Final   Total Protein 11/03/2021 7.4  6.5 - 8.1 g/dL Final   Albumin 11/03/2021 4.4  3.5 - 5.0 g/dL Final   AST 11/03/2021 16  15 - 41 U/L Final   ALT 11/03/2021 17  0 - 44 U/L Final   Alkaline Phosphatase 11/03/2021 89  38 - 126 U/L Final   Total Bilirubin 11/03/2021 0.7  0.3 - 1.2 mg/dL Final   GFR, Estimated 11/03/2021 >60  >60 mL/min Final   Comment: (NOTE) Calculated using the CKD-EPI Creatinine Equation (2021)    Anion gap 11/03/2021 10  5 - 15 Final   Performed at [facility] [address], [place], [place] [address]   Magnesium 11/03/2021 1.9  1.7 - 2.4 mg/dL Final   Performed at [facility] [address], [place], [place] [address]   Alcohol, Ethyl (B) 11/03/2021 <10  <10 mg/dL Final   Comment: (NOTE) Lowest detectable limit for  serum alcohol is 10 mg/dL.  For medical purposes only. Performed at [facility], [place] [address] [place], [place] [address]    Cholesterol 11/03/2021 207 (H)  0 - 200 mg/dL Final   Triglycerides 11/03/2021 85  <150 mg/dL Final   HDL 11/03/2021 47  >40 mg/dL Final   Total CHOL/HDL Ratio 11/03/2021 4.4  RATIO Final   VLDL 11/03/2021 17  0 - 40 mg/dL Final   LDL Cholesterol 11/03/2021 143 (H)  0 - 99 mg/dL Final   Comment:        Total Cholesterol/HDL:CHD Risk Coronary Heart Disease Risk Table                     Men   Women  1/2 Average Risk   3.4   3.3  Average Risk       5.0   4.4  2 X Average Risk   9.6   7.1  3 X Average Risk  23.4   11.0        Use the calculated Patient Ratio above and the CHD Risk Table to determine the patient's CHD Risk.        ATP III CLASSIFICATION (LDL):  <100     mg/dL   Optimal  100-129  mg/dL   Near or  Above                    Optimal  130-159  mg/dL   Borderline  160-189  mg/dL   High  >190     mg/dL   Very High Performed at [facility] [address] [place], [place] [address]    TSH 11/03/2021 1.703  0.350 - 4.500 uIU/mL Final   Comment: Performed by a 3rd Generation assay with a functional sensitivity of <=0.01 uIU/mL. Performed at [facility], [place] [address] [place], [place] [address]    Prolactin 11/03/2021 15.3  4.8 - 23.3 ng/mL Final   Comment: (NOTE) Performed At: [facility] [place], [place] [id number] Rush Farmer MD WY:[id number]    POC Amphetamine UR 11/03/2021 None Detected  NONE DETECTED (Cut Off Level 1000 ng/mL) Final   POC Secobarbital (BAR) 11/03/2021 None Detected  NONE DETECTED (Cut Off Level 300 ng/mL) Final   POC Buprenorphine (BUP) 11/03/2021 None Detected  NONE DETECTED (Cut Off Level 10 ng/mL) Final   POC Oxazepam (BZO) 11/03/2021 None Detected  NONE DETECTED (Cut Off Level 300 ng/mL) Final   POC Cocaine UR 11/03/2021 None Detected  NONE DETECTED (Cut Off Level 300 ng/mL) Final   POC Methamphetamine UR 11/03/2021 None Detected  NONE DETECTED (Cut Off Level 1000 ng/mL) Final   POC Morphine 11/03/2021 None Detected  NONE DETECTED (Cut Off Level 300 ng/mL) Final   POC Oxycodone UR 11/03/2021 None Detected  NONE DETECTED (Cut Off Level 100 ng/mL) Final   POC Methadone UR 11/03/2021 None Detected  NONE DETECTED (Cut Off Level 300 ng/mL) Final   POC Marijuana UR 11/03/2021 None Detected  NONE DETECTED (Cut Off Level 50 ng/mL) Final   SARS Coronavirus 2 Ag 11/03/2021 Negative  Negative Final   Preg Test, Ur 11/03/2021 NEGATIVE  NEGATIVE Final   Comment:        THE SENSITIVITY OF THIS METHODOLOGY IS >24 mIU/mL     Allergies: Aspartame and phenylalanine, Benadryl [diphenhydramine], Other, Scallops [shellfish allergy], Yellow jacket venom [bee venom], Pollen extract, Tegretol [carbamazepine], Adhesive [tape], and  Latex  PTA Medications: (Not  in a hospital admission)   Medical Decision Making  Sheyenne was admitted to [facility] continuous assessment unit for Schizoaffective disorder, depressive type [facility]), crisis management, and stabilization. Routine labs ordered, which include  Lab Orders         Resp Panel by RT-PCR (Flu A&B, Covid) Anterior Nasal Swab         CBC with Differential/Platelet         Comprehensive metabolic panel         Hemoglobin A1c         Magnesium         Ethanol         Urinalysis, Routine w reflex microscopic Urine, Clean Catch         Pregnancy, urine         POCT Urine Drug Screen - (I-Screen)    Medication Management: Medications started Meds ordered this encounter  Medications   acetaminophen (TYLENOL) tablet 650 mg   alum & mag hydroxide-simeth (MAALOX/MYLANTA) 200-200-20 MG/5ML suspension 30 mL   magnesium hydroxide (MILK OF MAGNESIA) suspension 30 mL   ARIPiprazole (ABILIFY) tablet 20 mg   FLUoxetine (PROZAC) capsule 20 mg   hydrOXYzine (ATARAX) tablet 25 mg   traZODone (DESYREL) tablet 200 mg    Will maintain observation for safety. Secure message sent to social work/TOC and [facility] requesting inpatient bed for mood disorder.  If no available at [facility] patient will need to be faxed out.      Recommendations  Based on my evaluation the patient does not appear to have an emergency medical condition.  Dejae Bernet, NP 04/03/22  2:15 PM

## 2022-04-03 NOTE — ED Triage Notes (Signed)
Pt presents to [facility] escorted by his counselor from the behavioral health outpatient. Pt states she has been overwhelmed since finding out that her son will be adopted by the family that has been taking care of him for the past 4 years. Pt reports receiving therapy services once a month, and last visit was today. Pt reports hearing voices telling her to harm herself. Pt reports NSSIB this morning by cutting her legs. Pt reports SI with a plan to harm herself by taking benadryl because she is allergic or overdosing on her medication. Pt states she was doing fine until finding out about the adoption. Pt states " It all hit me at once". Pt reports AVH; walls melting into blood,shadow people with green eyes, voices telling her to harmself. Pt denies HI.

## 2022-04-03 NOTE — ED Notes (Signed)
Patient A&Ox4. Patient present [place] with having thought of killing himself.  Reported that he was having thoughts of taking an overdose of Benadryl which he is also allergic to. Patient denies HI and VH. Patient admits to hearing voices telling him that he would be better off dead and that his son would be better off if he was dead.  Patient denies any physical complaints when asked. No acute distress noted. Support and encouragement provided. Routine safety checks conducted according to facility protocol. Encouraged patient to notify staff if thoughts of harm toward self or others arise. Patient verbalize understanding and agreement. Will continue to monitor for safety.

## 2022-04-03 NOTE — Progress Notes (Signed)
Pt was accepted to [facility] 04/04/22 PENDING NEGATIVE COVID-19; Bed Assignment [place]  Pt meets inpatient criteria per Earleen Newport, NP  Attending Physician will be Dr. Jonelle Sports  Report can be called to: [phone]  Pt can arrive after: 9am  Nursing notified:  Earleen Newport, NP  Nadara Mode, [place] 04/03/2022 @ 3:34 PM

## 2022-04-03 NOTE — ED Provider Notes (Cosign Needed Addendum)
  Accepted to [facility] tomorrow after 9 am accepted by Dr. Jonelle Sports [phone] PENDING neg COVID 19  COVID 19 results Negative  Amanda [name] B. Amanda Arpin, NP

## 2022-04-03 NOTE — Progress Notes (Signed)
   THERAPIST PROGRESS NOTE  Session Time: 25   Participation Level: Active  Behavioral Response: CasualAlertAnxious and Depressed  Type of Therapy: Individual Therapy  Treatment Goals addressed:   ProgressTowards Goals: Not Progressing  Interventions: Solution Focused and Supportive  Summary: Amanda [name] is a 41 y.o. adult who presents with depressed and anxious mood\affect.  Patient was pleasant, cooperative, and maintained good eye contact.  He engaged well in therapy session and was dressed casually.  Patient's primary stressor is suicidal ideations and increased stress due to legal situation.  Patient reports that on September 1 he is going to a court hearing for custody of his son who is currently in the Department of Social Services system in foster care.  Patient reports he is tense, worried, restless, and having suicidal ideations due to the increased stress of open versus closed adoption.  Amanda [name] reports that he would favor open adoption but knows that this will be up to the foster parents.  LCSW tried to speak with patient about utilizing coping skills for meditation, listening to music, and playing with dog.  Patient reports utilizing poor coping skills such as cutting as evidenced by 12 total cuts on both left and right legs.  Patient also reports suicidal ideations stating "sometimes I wonder if it would be better if I was not MPJs life at all".  LCSW spoke with patient if there was plan or intent, Amanda [name] reports plan of overdosing on medications prescribed and/or getting Benadryl.  Patient reports no intent in session but reports intent this morning.  Suicidal/Homicidal: Amanda [name] intent/plan  Therapist Response:   Intervention: LCSW utilized solution focused therapy and took patient down with MRN number to the behavioral health urgent care for further crisis evaluation.  Patient went down voluntarily without having to write IVC.    Plan: Plan for patient is to follow-up in 3  weeks after patient evaluated by behavioral health urgent care at [facility].  Diagnosis: Schizoaffective disorder, depressive type ([place])  Collaboration of Care: Other referral to behavioral health urgent care for suicidal ideations.  Patient/Guardian was advised Release of Information must be obtained prior to any record release in order to collaborate their care with an outside provider. Patient/Guardian was advised if they have not already done so to contact the registration department to sign all necessary forms in order for [place] to release information regarding their care.   Consent: Patient/Guardian gives verbal consent for treatment and assignment of benefits for services provided during this visit. Patient/Guardian expressed understanding and agreed to proceed.   [name], LCSW 04/03/2022

## 2022-04-03 NOTE — ED Notes (Signed)
Pt sleeping@this time. Breathing even and unlabored. Will continue to monitor for safety 

## 2022-04-04 ENCOUNTER — Telehealth: Payer: Self-pay

## 2022-04-04 ENCOUNTER — Encounter (HOSPITAL_COMMUNITY): Payer: Self-pay | Admitting: Registered Nurse

## 2022-04-04 DIAGNOSIS — F411 Generalized anxiety disorder: Secondary | ICD-10-CM

## 2022-04-04 DIAGNOSIS — F4310 Post-traumatic stress disorder, unspecified: Secondary | ICD-10-CM | POA: Diagnosis not present

## 2022-04-04 DIAGNOSIS — F603 Borderline personality disorder: Secondary | ICD-10-CM | POA: Diagnosis not present

## 2022-04-04 DIAGNOSIS — F251 Schizoaffective disorder, depressive type: Secondary | ICD-10-CM | POA: Diagnosis not present

## 2022-04-04 MED ORDER — NITROFURANTOIN MONOHYD MACRO 100 MG PO CAPS: 100.0000 mg | ORAL_CAPSULE | Freq: Two times a day (BID) | ORAL | Status: DC

## 2022-04-04 MED ORDER — NITROFURANTOIN MONOHYD MACRO 100 MG PO CAPS
100.0000 mg | ORAL_CAPSULE | Freq: Two times a day (BID) | ORAL | Status: DC
  Administered 2022-04-04: 100 mg via ORAL
  Filled 2022-04-04: qty 1

## 2022-04-04 NOTE — ED Provider Notes (Signed)
FBC/OBS ASAP Discharge Summary  Date and Time: 04/04/2022 8:39 AM  Name: Amanda [name]  MRN:  [id number]   Discharge Diagnoses:  Final diagnoses:  Schizoaffective disorder, depressive type ([place])  GAD (generalized anxiety disorder)  PTSD (post-traumatic stress disorder)  Borderline personality disorder ([place])    Subjective: Amanda [name], 41 y.o., adult presented to [facility] after he was initially seen by his therapist in office today with complaints of suicidal ideation and auditory hallucinations   [name], 41 y.o., adult patient seen face to face by this provider, consulted with Dr. Hampton Abbot; and chart reviewed on 04/04/22.  On evaluation Amanda [name] continues to report he is having auditory hallucinations and now having visual hallucinations of seeing blood everywhere.  He continues to be suicidal with intent, plan, and means.  Continue to recommend inpatient psychiatric treatment.   During evaluation Amanda [name] is standing up on phone and end phone call when time for assessment.  No noted distress at this time.  He is alert, oriented x 4, calm, cooperative and attentive.  His mood is anxious and depressed with congruent affect.  He has normal speech, and behavior.  Objectively there is no evidence of psychosis/mania or delusional thinking other than his reporting of auditory and visual hallucinations.  Patient is able to converse coherently, goal directed thoughts, no distractibility, or pre-occupation.  He denies homicidal ideation, paranoia but continues to have suicidal ideation.       Stay Summary: HPI:  Patient admitted to continuous assessment for safety.  Home medications restarted.  Continued to recommend for inpatient treatment and patient has been accepted to [facility].    Total Time spent with patient: 30 minutes  Past Psychiatric History: psychiatric history of anxiety, schizophrenia, schizoaffective disorder bipolar type, bipolar 1, borderline  personality, depression OCD, SI, and PTSD  Past Medical History:  Past Medical History:  Diagnosis Date   Anxiety    Asthma    Asthma due to environmental allergies    Asthma due to seasonal allergies    Bipolar 1 disorder ([place])    Borderline personality disorder ([place])    Brain bleed (HCC)    Chronic post-traumatic stress disorder (PTSD)    complex chronic with psychotic features and self harm behaviors   Complication of anesthesia    Constipation    Dander (animal) allergy    Hearing loss    right ear   Heart murmur    denies seeing a cardiologist   Hip dysplasia    Hypertension    Gestional    Major depression, chronic    Mood disorder (HCC)    OCD (obsessive compulsive disorder)    Pneumonia    PONV (postoperative nausea and vomiting)    PTSD (post-traumatic stress disorder)    RA (rheumatoid arthritis) ([place])    Rheumatoid arthritis ([place])    Schizophrenia ([place])    "I think that is wrong"   Seizure disorder ([place])    Suicidal ideations    Suicide attempt [facility])     Past Surgical History:  Procedure Laterality Date   Brain Shunt  1981   "a few hours old"   EYE SURGERY Bilateral [employer] (IUD) INSERTION N/A 09/16/2018   Procedure: INTRAUTERINE DEVICE (IUD) INSERTION;  Surgeon: Lavonia Drafts, MD;  Location: [facility];  Service: Gynecology;  Laterality: N/A;   PERINEUM REPAIR N/A 09/16/2018   Procedure: EPISIOTOMY REVISION;  Surgeon: Lavonia Drafts, MD;  Location: [facility];  Service: Gynecology;  Laterality: N/A;   SHUNT REMOVAL  1983   Tooth Removal     multiple   Family History:  Family History  Problem Relation Age of Onset   Heart attack Mother    Stroke Mother    Liver cancer Mother    Diabetes Mother    Lung cancer Mother    Alcoholism Father    Sleep apnea Brother    Depression Brother    ADD / ADHD Brother    Diabetes Maternal Aunt    Diabetes Paternal Uncle    Colon cancer Paternal Uncle    Diabetes Maternal  Grandmother    Dementia Maternal Grandmother    Family Psychiatric History: None reported Social History:  Social History   Substance and Sexual Activity  Alcohol Use Not Currently     Social History   Substance and Sexual Activity  Drug Use Not Currently   Types: Marijuana   Comment: sober from from drugs     Social History   Socioeconomic History   Marital status: Single    Spouse name: Not on file   Number of children: 1   Years of education: 12   Highest education level: 12th grade  Occupational History   Not on file  Tobacco Use   Smoking status: Every Day    Packs/day: 0.50    Years: 26.00    Total pack years: 13.00    Types: Cigarettes   Smokeless tobacco: Never  Vaping Use   Vaping Use: Former  Substance and Sexual Activity   Alcohol use: Not Currently   Drug use: Not Currently    Types: Marijuana    Comment: sober from from drugs    Sexual activity: Not Currently    Birth control/protection: None  Other Topics Concern   Not on file  Social History Narrative   ** Merged History Encounter **       Lives with her fiance, friend, and fiance's brother   Right handed   Social Determinants of Health   Financial Resource Strain: Low Risk  (07/01/2020)   Overall Financial Resource Strain (CARDIA)    Difficulty of Paying Living Expenses: Not hard at all  Food Insecurity: No Food Insecurity (07/01/2020)   Hunger Vital Sign    Worried About Running Out of Food in the Last Year: Never true    [place] in the Last Year: Never true  Transportation Needs: Unmet Transportation Needs (07/01/2020)   PRAPARE - Transportation    Lack of Transportation (Medical): Yes    Lack of Transportation (Non-Medical): Yes  Physical Activity: Insufficiently Active (07/01/2020)   Exercise Vital Sign    Days of Exercise per Week: 3 days    Minutes of Exercise per Session: 40 min  Stress: No Stress Concern Present (07/01/2020)   [place] of Stress : Not at all  Social Connections: Moderately Integrated (07/01/2020)   Social Connection and Isolation Panel [NHANES]    Frequency of Communication with Friends and Family: Three times a week    Frequency of Social Gatherings with Friends and Family: More than three times a week    Attends Religious Services: Never    Marine scientist or Organizations: Yes    Attends Archivist Meetings: More than 4 times per year    Marital Status: Married   SDOH:  SDOH Screenings   Alcohol Screen: Low Risk  (07/27/2021)  Alcohol Screen    Last Alcohol Screening Score (AUDIT): 0  Depression (PHQ2-9): Medium Risk (04/03/2022)   Depression (PHQ2-9)    PHQ-2 Score: 5  Financial Resource Strain: Low Risk  (07/01/2020)   Overall Financial Resource Strain (CARDIA)    Difficulty of Paying Living Expenses: Not hard at all  Food Insecurity: No Food Insecurity (07/01/2020)   Hunger Vital Sign    Worried About Running Out of Food in the Last Year: Never true    Ran Out of Food in the Last Year: Never true  Housing: High Risk (07/01/2020)   Housing    Last Housing Risk Score: 2  Physical Activity: Insufficiently Active (07/01/2020)   Exercise Vital Sign    Days of Exercise per Week: 3 days    Minutes of Exercise per Session: 40 min  Social Connections: Moderately Integrated (07/01/2020)   Social Connection and Isolation Panel [NHANES]    Frequency of Communication with Friends and Family: Three times a week    Frequency of Social Gatherings with Friends and Family: More than three times a week    Attends Religious Services: Never    Marine scientist or Organizations: Yes    Attends Music therapist: More than 4 times per year    Marital Status: Married  Stress: No Stress Concern Present (07/01/2020)   [employer]    Feeling of Stress : Not at all   Tobacco Use: High Risk (04/04/2022)   Patient History    Smoking Tobacco Use: Every Day    Smokeless Tobacco Use: Never    Passive Exposure: Not on file  Transportation Needs: Unmet Transportation Needs (07/01/2020)   PRAPARE - Hydrologist (Medical): Yes    Lack of Transportation (Non-Medical): Yes    Tobacco Cessation:  N/A, patient does not currently use tobacco products  Current Medications:  Current Facility-Administered Medications  Medication Dose Route Frequency Provider Last Rate Last Admin   acetaminophen (TYLENOL) tablet 650 mg  650 mg Oral Q6H PRN Thao Vanover B, NP       alum & mag hydroxide-simeth (MAALOX/MYLANTA) 200-200-20 MG/5ML suspension 30 mL  30 mL Oral Q4H PRN Tarron Krolak B, NP       ARIPiprazole (ABILIFY) tablet 20 mg  20 mg Oral Daily Verdia Bolt B, NP       FLUoxetine (PROZAC) capsule 20 mg  20 mg Oral Daily Guliana Weyandt B, NP       hydrOXYzine (ATARAX) tablet 25 mg  25 mg Oral TID PRN Jonquil Stubbe B, NP       magnesium hydroxide (MILK OF MAGNESIA) suspension 30 mL  30 mL Oral Daily PRN Deivi Huckins B, NP       nitrofurantoin (macrocrystal-monohydrate) (MACROBID) capsule 100 mg  100 mg Oral Q12H Stefhanie Kachmar B, NP       traZODone (DESYREL) tablet 200 mg  200 mg Oral QHS Crandall Harvel B, NP   200 mg at 04/03/22 2116   Current Outpatient Medications  Medication Sig Dispense Refill   acetaminophen (TYLENOL) 500 MG tablet Take 500 mg by mouth every 6 (six) hours as needed (For rheumatoid arthritis and hip dysplasia).     ARIPiprazole (ABILIFY) 20 MG tablet Take 1 tablet (20 mg total) by mouth daily. 30 tablet 3   FLUoxetine (PROZAC) 20 MG capsule Take 1 capsule (20 mg total) by mouth daily. 30 capsule 3   hydrOXYzine (ATARAX) 25 MG  tablet Take 1 tablet (25 mg total) by mouth 3 (three) times daily as needed for anxiety. 90 tablet 3   Menthol, Topical Analgesic, (ICY HOT BACK EX) Apply 1 application  topically as needed  (For back pain).     Multiple Vitamin (MULTIVITAMIN WITH MINERALS) TABS tablet Take 1 tablet by mouth daily.     traZODone (DESYREL) 100 MG tablet Take 2 tablets (200 mg total) by mouth at bedtime. 60 tablet 3    PTA Medications: (Not in a hospital admission)      04/03/2022    2:08 PM 12/13/2021    2:40 PM 11/15/2021    2:21 PM  Depression screen PHQ 2/9  Decreased Interest 1 0 0  Down, Depressed, Hopeless 1 0 1  PHQ - 2 Score 2 0 1  Altered sleeping 1 1 0  Tired, decreased energy 0 1 0  Change in appetite 0 0 1  Feeling bad or failure about yourself  1 0 1  Trouble concentrating 0 0 0  Moving slowly or fidgety/restless 0 0 0  Suicidal thoughts 1 0 0  PHQ-9 Score '5 2 3  '$ Difficult doing work/chores Not difficult at all Somewhat difficult Somewhat difficult    Flowsheet Row ED from 04/03/2022 in [facility] ED from 01/22/2022 in [place] Counselor from 11/15/2021 in [place] CATEGORY High Risk No Risk Error: Q3, 4, or 5 should not be populated when Q2 is No       Musculoskeletal  Strength & Muscle Tone: within normal limits Gait & Station: normal Patient leans: N/A  Psychiatric Specialty Exam  Presentation  General Appearance: Appropriate for Environment  Eye Contact:Good  Speech:Clear and Coherent; Normal Rate  Speech Volume:Normal  Handedness:Right   Mood and Affect  Mood:Anxious; Depressed  Affect:Congruent   Thought Process  Thought Processes:Coherent; Goal Directed  Descriptions of Associations:Intact  Orientation:Full (Time, Place and Person)  Thought Content:Logical  Diagnosis of Schizophrenia or Schizoaffective disorder in past: No  Duration of Psychotic Symptoms: Greater than six months   Hallucinations:Hallucinations: Visual Description of Auditory Hallucinations: Reports he is hearing voices telling him that he would be better off  dead, "Son would be better off without me if I was dead" Description of Visual Hallucinations: Reports she is seeing blood everywhere  Ideas of Reference:Delusions  Suicidal Thoughts:Suicidal Thoughts: Yes, Active SI Active Intent and/or Plan: With Intent; With Plan; With Means to [place]  Homicidal Thoughts:Homicidal Thoughts: No   Sensorium  Memory:Immediate Good; Recent Good; Remote Good  Judgment:Fair  Insight:Fair; Present   Executive Functions  Concentration:Good  Attention Span:Good  [place] of Knowledge:Good  Language:Good   Psychomotor Activity  Psychomotor Activity:Psychomotor Activity: Normal   Assets  Assets:Communication Skills; Desire for Improvement; Financial Resources/Insurance; Housing; Leisure Time; Social Support   Sleep  Sleep:Sleep: Good   Nutritional Assessment (For OBS and FBC admissions only) Has the patient had a weight loss or gain of 10 pounds or more in the last 3 months?: No Has the patient had a decrease in food intake/or appetite?: No Does the patient have dental problems?: No Does the patient have eating habits or behaviors that may be indicators of an eating disorder including binging or inducing vomiting?: No Has the patient recently lost weight without trying?: 0 Has the patient been eating poorly because of a decreased appetite?: 0 Malnutrition Screening Tool Score: 0    Physical Exam  Physical Exam  Vitals and nursing note reviewed. Exam conducted with a chaperone present.  Constitutional:      General: He is not in acute distress.    Appearance: Normal appearance. He is not ill-appearing.  Cardiovascular:     Rate and Rhythm: Normal rate.  Pulmonary:     Effort: Pulmonary effort is normal.  Skin:    General: Skin is warm and dry.     Comments: Patient has several superficial lacerations bilateral anterior lower leg (below knee).  Neurological:     Mental Status: He is alert and oriented to person,  place, and time.  Psychiatric:        Attention and Perception: Attention normal. He perceives auditory hallucinations.        Mood and Affect: Affect normal. Mood is depressed.        Speech: Speech normal.        Behavior: Behavior normal. Behavior is cooperative.        Thought Content: Thought content is not paranoid or delusional. Thought content includes suicidal ideation. Thought content does not include homicidal ideation. Thought content includes suicidal plan.        Cognition and Memory: Cognition and memory normal.        Judgment: Judgment is impulsive.    Review of Systems  Constitutional: Negative.   HENT:  Positive for hearing loss.        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  Wearing hearing aid   Eyes: Negative.   Respiratory: Negative.    Cardiovascular: Negative.   Gastrointestinal: Negative.   Genitourinary: Negative.   Skin:        Reports he self injured last night using a pocket knife to make superficial cuts to lower leg.    Neurological: Negative.   Psychiatric/Behavioral:  Positive for depression, hallucinations (auditory and visual.  This morning patient states she is alos seeing blood everywhere.  Voice continue to tell her that she is better off dead) and suicidal ideas. The patient is nervous/anxious and has insomnia.    Blood pressure 94/73, pulse 69, temperature 98.4 F (36.9 C), temperature source Oral, resp. rate 18, SpO2 98 %. There is no height or weight on file to calculate BMI.   Disposition: Recommend psychiatric Inpatient admission when medically cleared.   Domenik Trice, NP 04/04/2022, 8:39 AM

## 2022-04-04 NOTE — Telephone Encounter (Signed)
Pt was called and VM was left informing patient to return call to schedule her Welcome to Medicare visit with PCP

## 2022-04-04 NOTE — ED Notes (Signed)
Pt is Aox4 reports back pain, states it is chronic and tylenol helps with the pain.  Will provide medication.  Pt reports AVH.  States visual hallucinations are shadow people with glowing green eyes. She also reports the wall look like they are melting with blood coming out.  Auditory hallucinations are of an unfamiliar voice telling "your son would be better off if you were dead."  Pt was able to verbally contract for safety while on the unit.  Breathing is even and unlabored. Will continue to monitor for safety.

## 2022-04-04 NOTE — ED Notes (Signed)
Report called to [facility] and Safe transport notified of the need for transport.

## 2022-04-04 NOTE — ED Notes (Signed)
Amanda [name] remains asleep with no sleep disturbance or distress noted pt respirations are easy pt resting.

## 2022-04-09 ENCOUNTER — Other Ambulatory Visit (HOSPITAL_COMMUNITY): Payer: Self-pay

## 2022-04-09 MED ORDER — TRAZODONE HCL 100 MG PO TABS
200.0000 mg | ORAL_TABLET | Freq: Every day | ORAL | 1 refills | Status: DC
  Filled 2022-04-09: qty 30, 15d supply, fill #0

## 2022-04-09 MED ORDER — FLUOXETINE HCL 20 MG PO CAPS
20.0000 mg | ORAL_CAPSULE | Freq: Every day | ORAL | 1 refills | Status: DC
  Filled 2022-04-09: qty 15, 15d supply, fill #0

## 2022-04-09 MED ORDER — ARIPIPRAZOLE 20 MG PO TABS
20.0000 mg | ORAL_TABLET | Freq: Every day | ORAL | 1 refills | Status: DC
  Filled 2022-04-09: qty 15, 15d supply, fill #0

## 2022-04-17 ENCOUNTER — Other Ambulatory Visit (HOSPITAL_COMMUNITY): Payer: Self-pay

## 2022-04-24 ENCOUNTER — Ambulatory Visit (INDEPENDENT_AMBULATORY_CARE_PROVIDER_SITE_OTHER): Payer: Medicare (Managed Care) | Admitting: Licensed Clinical Social Worker

## 2022-04-24 DIAGNOSIS — F251 Schizoaffective disorder, depressive type: Secondary | ICD-10-CM

## 2022-04-24 DIAGNOSIS — F4310 Post-traumatic stress disorder, unspecified: Secondary | ICD-10-CM

## 2022-04-24 NOTE — Progress Notes (Unsigned)
   THERAPIST PROGRESS NOTE  Session Time: 30  Participation Level: Active  Behavioral Response: CasualAlertAnxious and Depressed  Type of Therapy: Individual Therapy  Treatment Goals addressed: Walk 3 x weekly    ProgressTowards Goals: Progressing  Interventions: CBT, Motivational Interviewing, and Supportive  Summary: Amanda [name] is a 41 y.o. adult who presents with depressed and anxious mood\affect.  He was pleasant, cooperative, maintained good eye contact.  Patient was alert and oriented x5.  Patient reports primary stressors as legal.  Patient reports she continues to be in a custody battle with Department of Social Services and the foster family of her 66-year-old child "Amanda [name]".  Patient reports that he is decided for open ended adoption.  Patient reports that that is what he would recommend to the court system and his lawyer is attempting to get to the foster family to be agreeable to, however it is at the foster family discretion to keep that option open or closed.  Per patient court has mandated that he missed no more weekly visits without excuse absences.  Patient reports that he has been missing 6 sessions for visits due to illness with his leg and also due to his mental health.  Suicidal/Homicidal: Yeswithout intent/plan  Therapist Response:     Intervention/Plan: LCSW utilized CBT therapy for reframing.  LCSW CBT for unconditional positive regard.  LCSW educated patient on safety planning for reoccurring suicidal ideations.  Amanda [name] reports no suicidal ideation since last visit.  LCSW educated patient on taking medications as prescribed.  LCSW used open-ended questions, positive affirmations, and reflective listening for motivational interviewing.  Plan for patient is to continue to utilize coping skills for drawing, listening to music, and utilizing emotional support animal.  Plan: Return again in 4 weeks.  Diagnosis: Schizoaffective disorder, depressive type ([place])  PTSD  (post-traumatic stress disorder)  Collaboration of Care: Other None today.   Patient/Guardian was advised Release of Information must be obtained prior to any record release in order to collaborate their care with an outside provider. Patient/Guardian was advised if they have not already done so to contact the registration department to sign all necessary forms in order for [place] to release information regarding their care.   Consent: Patient/Guardian gives verbal consent for treatment and assignment of benefits for services provided during this visit. Patient/Guardian expressed understanding and agreed to proceed.   Amanda Horn, LCSW 04/24/2022

## 2022-05-15 ENCOUNTER — Ambulatory Visit (INDEPENDENT_AMBULATORY_CARE_PROVIDER_SITE_OTHER): Payer: Medicare (Managed Care) | Admitting: Licensed Clinical Social Worker

## 2022-05-15 DIAGNOSIS — F251 Schizoaffective disorder, depressive type: Secondary | ICD-10-CM | POA: Diagnosis not present

## 2022-05-15 IMAGING — DX DG HIP (WITH OR WITHOUT PELVIS) 2-3V*R*
3 series · 3 of 3 positions shown · non-contrast
Comparison: None

CLINICAL DATA: RIGHT lower extremity pain and numbness beginning 2
weeks ago

EXAM:
DG HIP (WITH OR WITHOUT PELVIS) 2-3V RIGHT

[pelvis ap]
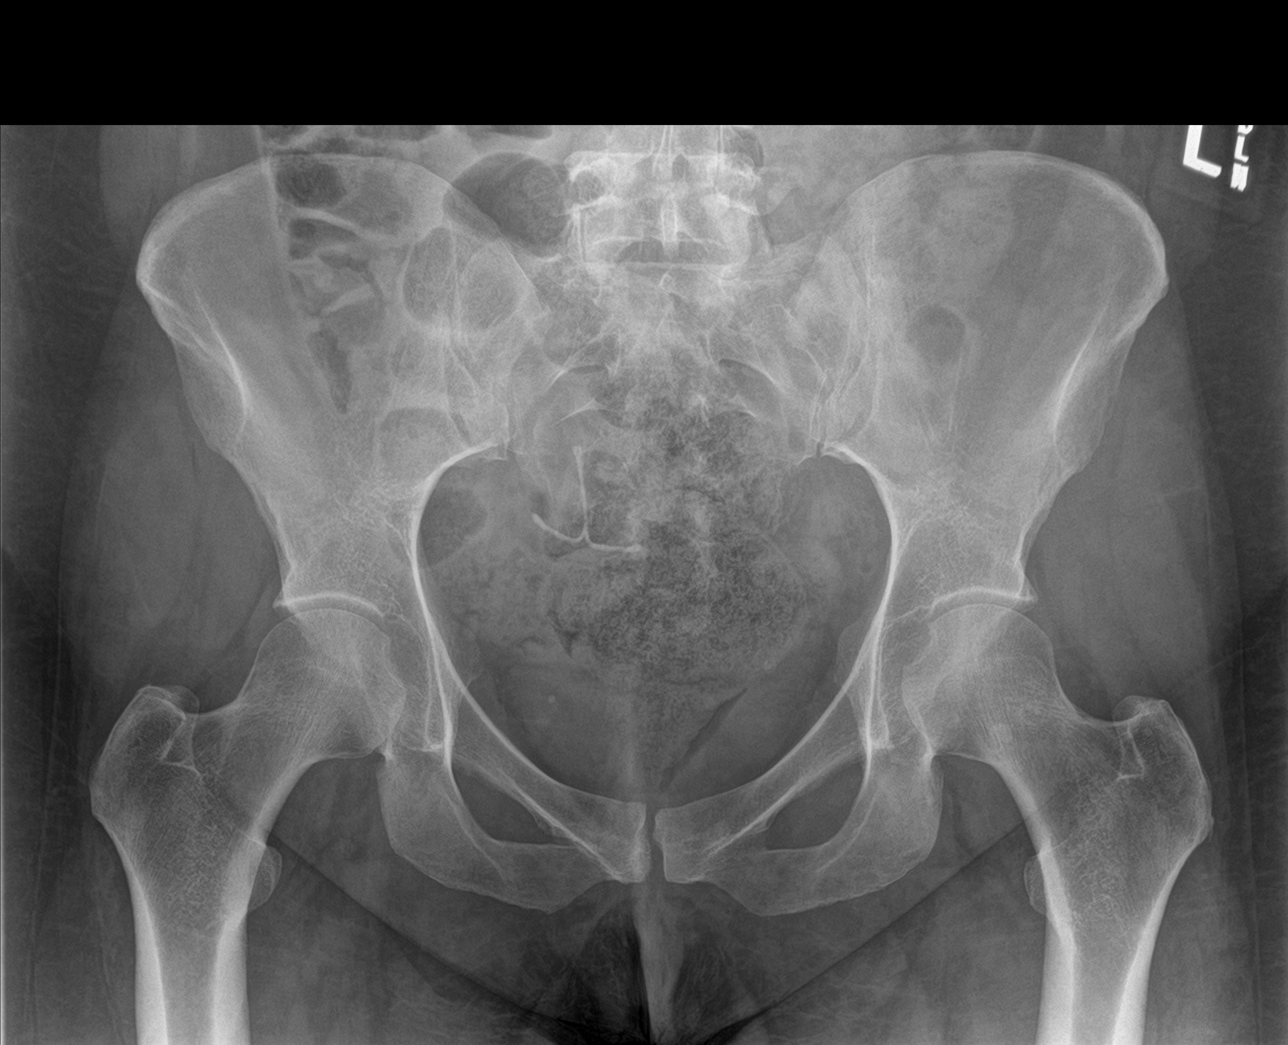

[hip ap]
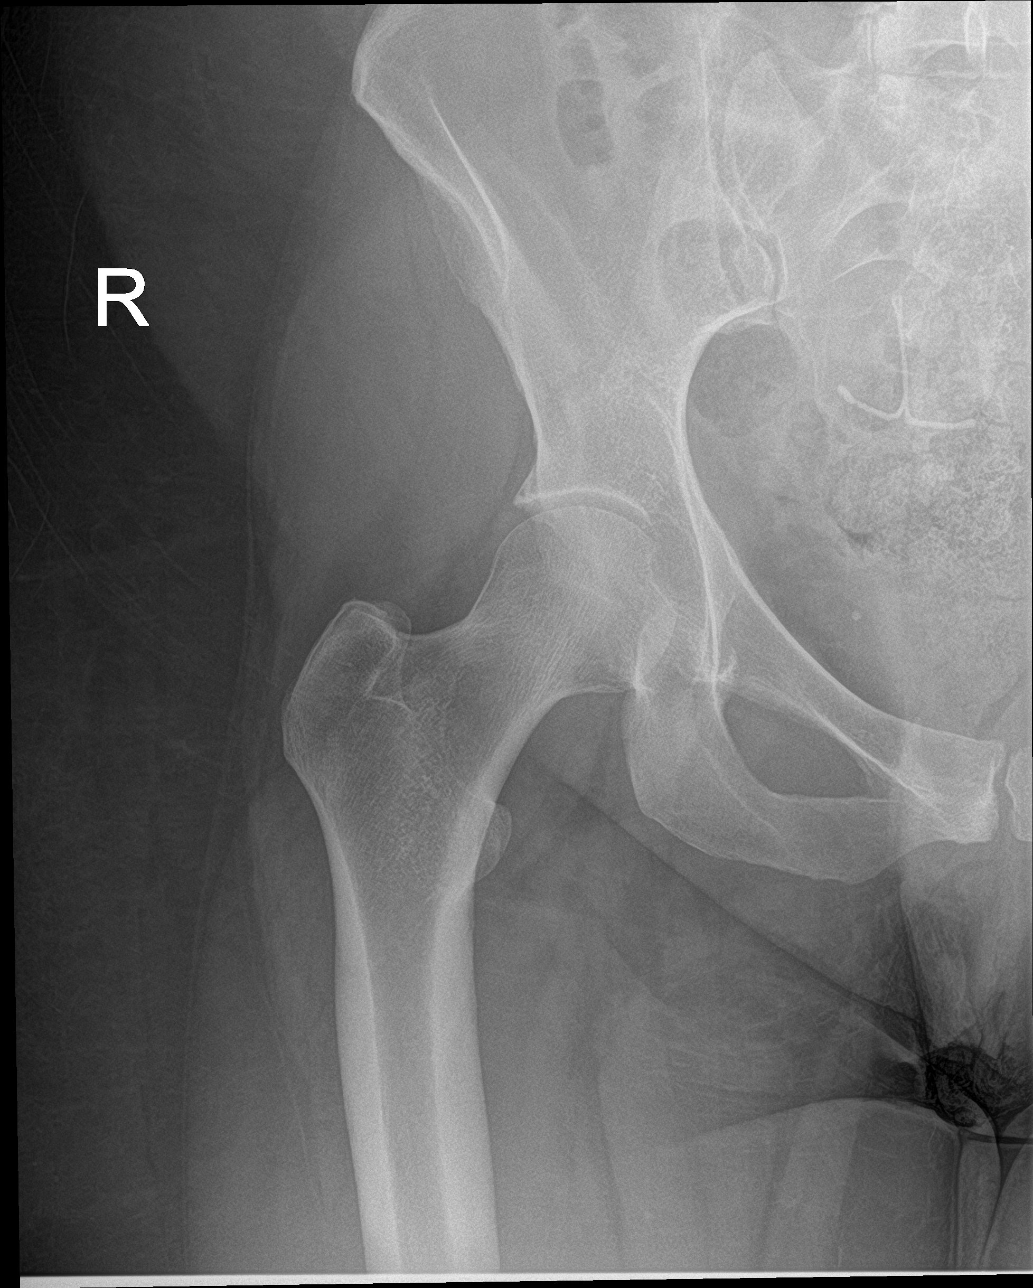

[hip lat]
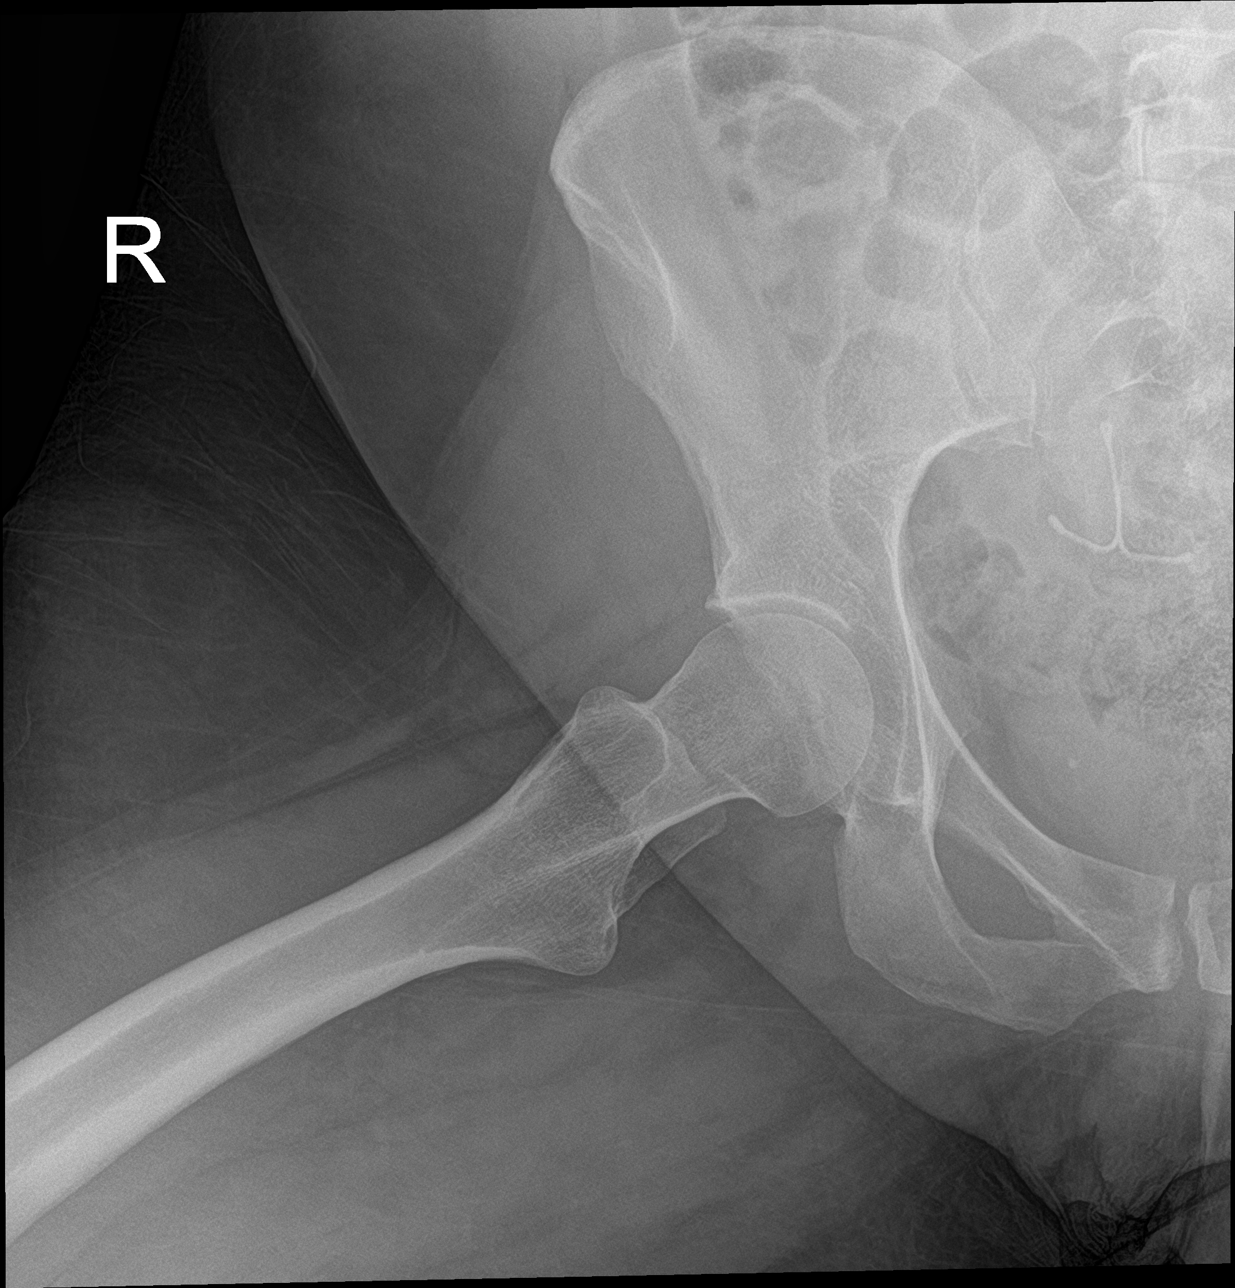

[3 of 3 positions shown; findings below may reference images not displayed]

FINDINGS: Osseous mineralization normal.

Hip and SI joint spaces preserved.

No acute fracture, dislocation, or bone destruction.

IUD projects over pelvis.

Slightly prominent stool in rectum.
IMPRESSION: No acute osseous abnormalities.

## 2022-05-15 NOTE — Progress Notes (Signed)
   THERAPIST PROGRESS NOTE  Session Time: 65  Participation Level: Active  Behavioral Response: CasualAlertAnxious and Depressed  Type of Therapy: Individual Therapy  Treatment Goals addressed: Walk 3 x weekly   ProgressTowards Goals: Progressing  Interventions: Motivational Interviewing and Supportive  Summary: Amanda [name] is a 41 y.o. adult who presents with depressed and anxious mood\affect.  Patient was pleasant, cooperative, maintained good eye contact.  He engaged well in therapy session was dressed casually.  Patient comes in today with primary stressors of legal.  Patient reports that Department of Social Services is coming to talk with the patient about adoption process for patient's son "PJ".  Patient reports that he would like an open ended adoption to still be a part of PJ's life.  Patient reports that he is not aware if he will be open or close ended adoption.  Patient wishes for the adoption process to move forward with his son's current foster family.  Patient reports tension and worry explaining to his father that the only grandson will be put up for adoption.  Suicidal/Homicidal: Nowithout intent/plan  Therapist Response:    Intervention/Plan: LCSW went over the worksheet "assertive communication".  This worksheet was found on therapy [email or web address].  Patient presented with paper copy.  Patient to practice 1-2 times weekly.  LCSW supportive therapy for praise and encouragement.  LCSW used CBT therapy for reframing.  LCSW used person centered therapy for unconditional positive regard and empowerment.   Plan: Return again in 3 weeks.  Diagnosis: Schizoaffective disorder, depressive type ([place])  Collaboration of Care: Other None today   Patient/Guardian was advised Release of Information must be obtained prior to any record release in order to collaborate their care with an outside provider. Patient/Guardian was advised if they have not already done so to contact the  registration department to sign all necessary forms in order for [place] to release information regarding their care.   Consent: Patient/Guardian gives verbal consent for treatment and assignment of benefits for services provided during this visit. Patient/Guardian expressed understanding and agreed to proceed.   [name], LCSW 05/15/2022

## 2022-06-12 ENCOUNTER — Ambulatory Visit (INDEPENDENT_AMBULATORY_CARE_PROVIDER_SITE_OTHER): Payer: Medicare (Managed Care) | Admitting: Licensed Clinical Social Worker

## 2022-06-12 ENCOUNTER — Other Ambulatory Visit: Payer: Self-pay

## 2022-06-12 ENCOUNTER — Encounter (HOSPITAL_COMMUNITY): Payer: Self-pay | Admitting: Student in an Organized Health Care Education/Training Program

## 2022-06-12 ENCOUNTER — Encounter (HOSPITAL_COMMUNITY): Payer: Medicare (Managed Care) | Admitting: Psychiatry

## 2022-06-12 ENCOUNTER — Telehealth (INDEPENDENT_AMBULATORY_CARE_PROVIDER_SITE_OTHER): Payer: Medicare (Managed Care) | Admitting: Student in an Organized Health Care Education/Training Program

## 2022-06-12 DIAGNOSIS — F251 Schizoaffective disorder, depressive type: Secondary | ICD-10-CM | POA: Diagnosis not present

## 2022-06-12 DIAGNOSIS — F250 Schizoaffective disorder, bipolar type: Secondary | ICD-10-CM | POA: Diagnosis not present

## 2022-06-12 DIAGNOSIS — F411 Generalized anxiety disorder: Secondary | ICD-10-CM

## 2022-06-12 MED ORDER — FLUOXETINE HCL 20 MG PO CAPS
20.0000 mg | ORAL_CAPSULE | Freq: Every day | ORAL | 3 refills | Status: DC
  Filled 2022-06-12 – 2022-06-18 (×2): qty 30, 30d supply, fill #0

## 2022-06-12 MED ORDER — ARIPIPRAZOLE 20 MG PO TABS
20.0000 mg | ORAL_TABLET | Freq: Every day | ORAL | 3 refills | Status: DC
  Filled 2022-06-12 – 2022-06-18 (×2): qty 30, 30d supply, fill #0

## 2022-06-12 MED ORDER — HYDROXYZINE HCL 25 MG PO TABS
25.0000 mg | ORAL_TABLET | Freq: Three times a day (TID) | ORAL | 3 refills | Status: DC | PRN
  Filled 2022-06-12 – 2022-06-18 (×2): qty 90, 30d supply, fill #0

## 2022-06-12 MED ORDER — TRAZODONE HCL 100 MG PO TABS
200.0000 mg | ORAL_TABLET | Freq: Every day | ORAL | 3 refills | Status: DC
  Filled 2022-06-12 – 2022-06-18 (×2): qty 60, 30d supply, fill #0

## 2022-06-12 NOTE — Plan of Care (Signed)
Treatment plan dates updated

## 2022-06-12 NOTE — Progress Notes (Signed)
   THERAPIST PROGRESS NOTE  Virtual Visit via Video Note  I connected with [name] on 06/12/22 at  3:00 PM EDT by a video enabled telemedicine application and verified that I am speaking with the correct person using two identifiers.  Location: Patient: [facility]  Provider: Providers Home    I discussed the limitations of evaluation and management by telemedicine and the availability of in person appointments. The patient expressed understanding and agreed to proceed.     I discussed the assessment and treatment plan with the patient. The patient was provided an opportunity to ask questions and all were answered. The patient agreed with the plan and demonstrated an understanding of the instructions.   The patient was advised to call back or seek an in-person evaluation if the symptoms worsen or if the condition fails to improve as anticipated.  I provided 30 minutes of non-face-to-face time during this encounter.   [name], LCSW   Participation Level: Active  Behavioral Response: CasualAlertAnxious and Depressed  Type of Therapy: Individual Therapy  Treatment Goals addressed: Walk 3 x weekly  ProgressTowards Goals: Progressing  Interventions: Motivational Interviewing and Supportive   Suicidal/Homicidal: Nowithout intent/plan  Therapist Response:   Pt was alert and oriented x 5. He was dressed casually and engaged well in therapy session. Pt was pleasant, cooperative and maintained good eye contact. He presented with depressed and anxious mood/affect.   Pt reports primary stressor is legal. Pt reports he went to a parenting class where he found out that in the state of [place] there is no open-ended adoptions.  Pt reports the people that adopt his son can still contact him but that is at the adoptive parent's discretion. Pt reports tension and worry about the adoption process as he is not sure if he will be able to stay in contact with his son if the  adoption process goes through. Other stressor for pt is housing. Pt reports that his apartment complex got bought by a new company and they are not renewing his lease. Pt has 30 days to find a new place or be homeless. Pt reports utilizing resources for [employer] and DSS.   Intervention/Plan: LCSW used supportive therapy for praise and encouragement. LCSW used CBT therapy for cognitive restructuring and reframing. LCSW used open ended questions and reflective listening for motivational interviewing. Plan for pt to continue to use coping skills for listening to music and writing 1 x daily       Plan: Return again in 3 weeks.  Diagnosis: Schizoaffective disorder, bipolar type ([place])  Collaboration of Care: Other None today  Patient/Guardian was advised Release of Information must be obtained prior to any record release in order to collaborate their care with an outside provider. Patient/Guardian was advised if they have not already done so to contact the registration department to sign all necessary forms in order for [place] to release information regarding their care.   Consent: Patient/Guardian gives verbal consent for treatment and assignment of benefits for services provided during this visit. Patient/Guardian expressed understanding and agreed to proceed.   [name], LCSW 06/12/2022
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
## 2022-06-13 ENCOUNTER — Other Ambulatory Visit: Payer: Self-pay

## 2022-06-14 ENCOUNTER — Other Ambulatory Visit: Payer: Self-pay

## 2022-06-18 ENCOUNTER — Other Ambulatory Visit: Payer: Self-pay

## 2022-07-03 ENCOUNTER — Ambulatory Visit (INDEPENDENT_AMBULATORY_CARE_PROVIDER_SITE_OTHER): Payer: Medicare (Managed Care) | Admitting: Licensed Clinical Social Worker

## 2022-07-03 DIAGNOSIS — F4310 Post-traumatic stress disorder, unspecified: Secondary | ICD-10-CM

## 2022-07-03 DIAGNOSIS — F411 Generalized anxiety disorder: Secondary | ICD-10-CM

## 2022-07-03 DIAGNOSIS — F251 Schizoaffective disorder, depressive type: Secondary | ICD-10-CM

## 2022-07-03 NOTE — Progress Notes (Addendum)
   THERAPIST PROGRESS NOTE  Session Time: 30  Participation Level: Active  Behavioral Response: CasualAlertAnxious and Depressed  Type of Therapy: Individual Therapy  Treatment Goals addressed:  Identify 3 trigger for depression and anxiety    ProgressTowards Goals: Progressing  Interventions: CBT, Motivational Interviewing, and Supportive  Summary: Amanda [name] is a 41 y.o. adult who presents with    Suicidal/Homicidal: Nowithout intent/plan  Therapist Response:   Pt was alert and oriented x 5. He was dressed casually and engaged well in therapy session. He presented with depressed and anxious mood/affect. He was pleasant, cooperative, and maintained good eye contact.   Primary stressors for pt are legal and housing. Pt reports that he is getting evicted from his home. He received a 1 month notice 3 weeks ago. Pt reports that there are now new owners of the property and due to carpet damage caused by his dog and bugs in the house they are going to evict pt. Pt reports no leads at this time for apartments with 4 days left. [name] does have an appointment with his housing case worker tomorrow at [facility] and pt is hoping for new leads. Pt states he is on a restricted budget as he only receives 1100 monthly for income and needs to have rent be no more than 700 to pay for the rest of his utilities and groceries for the month.  Another stressor for pt is legal. Pt reports she is moving forward with the adoption of his son who is in [place] custody. Pt has next court hearing on Feb 16th. Pt reports a hopeful outcome of open adoption.   Intervention/Plan: LCSW used supportive therapy for praise and encouragement. LCSW educated pt on services that DSS can provide for food stamps if he qualifies. LCSW used empowerment for person centered therapy. LCSW used motivational interviewing for open ended question, reflective listening, and positive affirmation. Plan for pt to f/u with housing case  worker tomorrow for further information on available housing    Plan: Return again in 3 weeks.  Diagnosis: Schizoaffective disorder, depressive type (HCC)  PTSD (post-traumatic stress disorder)  GAD (generalized anxiety disorder)  Collaboration of Care: Other None today  Patient/Guardian was advised Release of Information must be obtained prior to any record release in order to collaborate their care with an outside provider. Patient/Guardian was advised if they have not already done so to contact the registration department to sign all necessary forms in order for [place] to release information regarding their care.   Consent: Patient/Guardian gives verbal consent for treatment and assignment of benefits for services provided during this visit. Patient/Guardian expressed understanding and agreed to proceed.   [name], LCSW 07/03/2022

## 2022-07-03 NOTE — Plan of Care (Signed)
  Problem: Depression Goal: STG: Jersie "[name]" WILL COMPLETE AT LEAST 80% OF ASSIGNED HOMEWORK Outcome: Progressing Goal:   Walk 3 x weekly  Outcome: Progressing Goal: Listening to classical or write 3 x weekly  Outcome: Progressing Goal:  Identify 3 trigger for depression and anxiety  Outcome: Progressing Goal: Decrease IP psych admission to less than 1 x per year.  Outcome: Progressing

## 2022-07-23 ENCOUNTER — Ambulatory Visit (INDEPENDENT_AMBULATORY_CARE_PROVIDER_SITE_OTHER): Payer: Medicare (Managed Care) | Admitting: Licensed Clinical Social Worker

## 2022-07-23 DIAGNOSIS — F411 Generalized anxiety disorder: Secondary | ICD-10-CM

## 2022-07-23 DIAGNOSIS — F251 Schizoaffective disorder, depressive type: Secondary | ICD-10-CM

## 2022-07-23 NOTE — Plan of Care (Signed)
  Problem: Depression Goal: STG: [name] "[name]" WILL COMPLETE AT LEAST 80% OF ASSIGNED HOMEWORK Outcome: Progressing Goal:   Walk 3 x weekly  Outcome: Progressing Goal: Listening to classical or write 3 x weekly  Outcome: Progressing Goal:  Identify 3 trigger for depression and anxiety  Outcome: Progressing Goal: Decrease IP psych admission to less than 1 x per year.  Outcome: Progressing

## 2022-07-23 NOTE — Progress Notes (Signed)
   THERAPIST PROGRESS NOTE  Session Time: 30  Participation Level: Active  Behavioral Response: CasualAlertAnxious and Depressed  Type of Therapy: Individual Therapy  Treatment Goals addressed: Goal: Listening to classical or write 3 x weekly  Outcome: Progressing   ProgressTowards Goals: Progressing  Interventions: CBT, Motivational Interviewing, and Supportive  Summary: Amanda [name] is a 41 y.o. adult who presents with depressed and anxious mood\affect.  Patient was pleasant, cooperative, maintained good eye contact.  He engaged well in therapy session was dressed casually.  Amanda [name] was alert and oriented x 5.  Primary stressor for patient is housing and legal.  Patient reports that he is still dealing with a custody battle with Department of Social Services.  He knows that he would like to proceed with open adoption with the current foster parents.  But it is at the foster parents discretion to keep the adoption open.  Patient also reports housing problems as he has been evicted from his previous subsidized living facility.  This was due to damage caused by his dog Luna some bugs that they found in the apartment.  Amanda [name] reports that he is currently living in a hotel at this time but will only be able to stay there for 1-2 more days.  Patient states he does have a contact through a friend that helps people with subsidized housing apartments and patient is going to contact them today.  Patient reports utilizing coping skills as listening to music, spending time with his dog, and writing.  Patient denies any suicidal or homicidal ideations at this time.  Suicidal/Homicidal: Nowithout intent/plan  Therapist Response:    Intervention/Plan: LCSW psychoanalytic therapy for patient to express thoughts, feelings and concerns.  LCSW utilized supportive therapy praise and encouragement.  LCSW encourage patient to continue to utilize coping skills listed above.  LCSW and patient spoke of plan to  contact housing coalition through [place] and to continue to utilize coping skills at least 1 time per day.  LCSW used open-ended questions, positive affirmations, and reflective listening for motivational interviewing.   Plan: Return again in 3 weeks.  Diagnosis: Schizoaffective disorder, depressive type ([place])  Collaboration of Care: Other None today   Patient/Guardian was advised Release of Information must be obtained prior to any record release in order to collaborate their care with an outside provider. Patient/Guardian was advised if they have not already done so to contact the registration department to sign all necessary forms in order for [place] to release information regarding their care.   Consent: Patient/Guardian gives verbal consent for treatment and assignment of benefits for services provided during this visit. Patient/Guardian expressed understanding and agreed to proceed.   [name], LCSW 07/23/2022

## 2022-08-06 ENCOUNTER — Ambulatory Visit (INDEPENDENT_AMBULATORY_CARE_PROVIDER_SITE_OTHER): Payer: Medicare (Managed Care) | Admitting: Licensed Clinical Social Worker

## 2022-08-06 ENCOUNTER — Encounter (HOSPITAL_COMMUNITY): Payer: Self-pay

## 2022-08-06 DIAGNOSIS — F251 Schizoaffective disorder, depressive type: Secondary | ICD-10-CM

## 2022-08-06 NOTE — Progress Notes (Signed)
   THERAPIST PROGRESS NOTE Virtual Visit via Video Note  I connected with Amanda [name] on 08/06/22 at  2:00 PM EST by a video enabled telemedicine application and verified that I am speaking with the correct person using two identifiers.  Location: Patient: [name]  Provider: Provider Home    I discussed the limitations of evaluation and management by telemedicine and the availability of in person appointments. The patient expressed understanding and agreed to proceed.     I discussed the assessment and treatment plan with the patient. The patient was provided an opportunity to ask questions and all were answered. The patient agreed with the plan and demonstrated an understanding of the instructions.   The patient was advised to call back or seek an in-person evaluation if the symptoms worsen or if the condition fails to improve as anticipated.  I provided 40 minutes of non-face-to-face time during this encounter.   [name], LCSW   Participation Level: Active  Behavioral Response: CasualAlertAnxious and Depressed  Type of Therapy: Individual Therapy  Treatment Goals addressed:  Identify 3 trigger for depression and anxiet    ProgressTowards Goals: Progressing  Interventions: Motivational Interviewing and Supportive  Suicidal/Homicidal: Nowithout intent/plan  Therapist Response:     Pt was alert and oriented x 5. He was dressed casually and engaged well I therapy session. He presented with depressed and anxious mood/affect. [name] was pleasant, cooperative, and maintained good eye contact.   Pt reports primary stressor of having camp site cleared. Stating "I lost all my stuff". LCSW inquired if it was stolen. Pt stated he does not believe it was because everything was cleared out and when things are stolen usually, they get what they want and trash the camp site. Pt has been homeless for about 1 month. She has been able to replace the tent and blanket lost.  She reports the loss of her son Christmas presents and some photos. She can re print the photos and will have to buy new Christmas presents when able. [name] states she has been able to stay at a friend's apartment at night during colder nights.  Other stressors for pt are open adoption for her son PJ. Pt reports his parents have started getting involved with the adoption process. Pt reports he wants the foster family that has custody of PJ to adopt him.   Intervention/Plan: LCS used supportive therapy for praise and encouragement. LCSW used psychoanalytic therapy for pt to express thoughts, feelings and concerns. LCSW used open ended questions, reflective listening, and positive affirmations for motivational interviewing. LCSW spoke with pt about trigger for depression being stable housing.   Plan: Return again in 3 weeks.  Diagnosis: Schizoaffective disorder, depressive type ([place])  Collaboration of Care: Other None today   Patient/Guardian was advised Release of Information must be obtained prior to any record release in order to collaborate their care with an outside provider. Patient/Guardian was advised if they have not already done so to contact the registration department to sign all necessary forms in order for [place] to release information regarding their care.   Consent: Patient/Guardian gives verbal consent for treatment and assignment of benefits for services provided during this visit. Patient/Guardian expressed understanding and agreed to proceed.   [name], LCSW 08/06/2022

## 2022-08-21 ENCOUNTER — Encounter (HOSPITAL_COMMUNITY): Payer: Self-pay

## 2022-08-21 ENCOUNTER — Ambulatory Visit (INDEPENDENT_AMBULATORY_CARE_PROVIDER_SITE_OTHER): Payer: Medicare (Managed Care) | Admitting: Licensed Clinical Social Worker

## 2022-08-21 DIAGNOSIS — F251 Schizoaffective disorder, depressive type: Secondary | ICD-10-CM

## 2022-08-21 NOTE — Progress Notes (Signed)
   THERAPIST PROGRESS NOTE  Session Time: 52  Participation Level: Active  Behavioral Response: CasualAlertAnxious and Depressed  Type of Therapy: Individual Therapy  Treatment Goals addressed: Problem: Depression Goal: STG: Micaila "[name]" WILL COMPLETE AT LEAST 80% OF ASSIGNED HOMEWORK Outcome: Progressing Goal:   Walk 3 x weekly  Outcome: Progressing Goal: Listening to classical or write 3 x weekly  Outcome: Progressing Goal:  Identify 3 trigger for depression and anxiety  Outcome: Progressing Goal: Decrease IP psych admission to less than 1 x per year.  Outcome: Progressing    Interventions: CBT, Motivational Interviewing, and Supportive  Summary: Joellyn Grandt is a 42 y.o. adult who presents with depressed and anxious mood\affect.  Patient was pleasant, cooperative, maintained good eye contact.  He was alert and oriented x 5.  [name] reports primary stressors as financial, housing, and legal.  Patient reports that he is still living out of a hotel for the past several weeks.  Patient reports that he is only able to afford a hotel at the beginning of the month when he gets his Social Security check.  Patient reports that he lives in a tent on the streets of [place] when hotel is not an option.  Patient also reports that he has been utilizing the [facility] for his mailing address.  Patient reports stressors as financial as he has lost his debit card somewhere in the hotel room and cannot find it.  Patient reports that no money has been taken as he walked for card.  Patient states that he has followed his tracks back to stores that he visited last but no cards were turned on.  Final stressor for patient is legal as he is currently in the process of adopting his biological child to the foster parents that currently take care of him.  Patient reports that he has been compliant with DSS request, however psychiatric care for hospitalizations have been a concern over the last several  years.  Suicidal/Homicidal: Yeswithout intent/plan  Therapist Response:    Intervention: LCSW spoke on the benefits of walking multiple times per week.  Patient reports goal for walk 3 times weekly has been met via care plan.  Patient reports walking daily.  Patient also reports coping skills as listening to classical music and writing multiple times per week.  LCSW supportive therapy for praise and encouragement.  LCSW spoke with patient about triggers as identifying 3 triggers for depression and anxiety has been goal for patient and patient identified open adoption for biological child has been his primary trigger.   Plan: Return again in 3 weeks.  Diagnosis: Schizoaffective disorder, depressive type ([place])  Collaboration of Care: Other None today  Patient/Guardian was advised Release of Information must be obtained prior to any record release in order to collaborate their care with an outside provider. Patient/Guardian was advised if they have not already done so to contact the registration department to sign all necessary forms in order for [place] to release information regarding their care.   Consent: Patient/Guardian gives verbal consent for treatment and assignment of benefits for services provided during this visit. Patient/Guardian expressed understanding and agreed to proceed.   [name], LCSW 08/21/2022

## 2022-09-07 ENCOUNTER — Telehealth (HOSPITAL_COMMUNITY): Payer: Medicare (Managed Care) | Admitting: Student in an Organized Health Care Education/Training Program

## 2022-09-11 ENCOUNTER — Telehealth (HOSPITAL_COMMUNITY): Payer: Medicare (Managed Care) | Admitting: Student in an Organized Health Care Education/Training Program

## 2022-09-11 ENCOUNTER — Ambulatory Visit (HOSPITAL_COMMUNITY): Payer: Medicare (Managed Care) | Admitting: Licensed Clinical Social Worker

## 2022-09-12 ENCOUNTER — Ambulatory Visit (INDEPENDENT_AMBULATORY_CARE_PROVIDER_SITE_OTHER): Payer: Medicare (Managed Care) | Admitting: Licensed Clinical Social Worker

## 2022-09-12 DIAGNOSIS — F251 Schizoaffective disorder, depressive type: Secondary | ICD-10-CM

## 2022-09-12 NOTE — Progress Notes (Signed)
THERAPIST PROGRESS NOTE  Session Time: 30  Participation Level: Active  Behavioral Response: CasualAlertAnxious  Type of Therapy: Individual Therapy  Treatment Goals addressed:  Active     Depression     STG: Amanda "Amanda [name]" WILL COMPLETE AT LEAST 80% OF ASSIGNED HOMEWORK (Progressing)     Start:  09/25/21    Expected End:  10/18/22         STG: Reduce overall depression score by a minimum of 25% on the Patient Health Questionnaire (PHQ-9) or the Montgomery-Asberg Depression Rating Scale (MADRS) (Completed/Met)     Start:  09/25/21    Expected End:  05/18/22    Resolved:  12/13/21        Walk 3 x weekly  (Completed/Met)     Start:  10/17/21    Expected End:  10/18/22    Resolved:  08/21/22       Goal Note     Pt reports walking daily          Listening to classical or write 3 x weekly  (Progressing)     Start:  10/17/21    Expected End:  10/18/22             Identify 3 trigger for depression and anxiety  (Progressing)     Start:  10/17/21    Expected End:  10/18/22          Goal Note     Adoption for biological son Amanda [name]           Decrease IP psych admission to less than 1 x per year.  (Progressing)     Start:  10/17/21    Expected End:  10/18/22          Goal Note     Pt reports no IP admissions 07/27/21                09/12/2022    1:35 PM 07/23/2022   11:22 AM 04/03/2022    2:08 PM 12/13/2021    2:40 PM 11/15/2021    2:21 PM  Depression screen PHQ 2/9  Decreased Interest 0 1 1 0 0  Down, Depressed, Hopeless 0 0 1 0 1  PHQ - 2 Score 0 1 2 0 1  Altered sleeping '1 1 1 1 '$ 0  Tired, decreased energy 1 1 0 1 0  Change in appetite 0 0 0 0 1  Feeling bad or failure about yourself  0 1 1 0 1  Trouble concentrating 0 2 0 0 0  Moving slowly or fidgety/restless 1 0 0 0 0  Suicidal thoughts 0 0 1 0 0  PHQ-9 Score '3 6 5 2 3  '$ Difficult doing work/chores Somewhat difficult Somewhat difficult Not difficult at all Somewhat difficult Somewhat difficult     Interventions: CBT, Motivational Interviewing, and Supportive  Summary: Amanda [name] is a 42 y.o. adult who presents with  depressed and anxious mood/affect. Amanda [name] was alert and oriented x 5.  He was pleasant, cooperative, maintained good eye contact.  Patient reports today that primary stressors are for  housing, illness, and legal.  Patient reports that there is some custody battle ongoing between him and DSS.  Patient reports that he has decided to sign over parental rights over to the state in hopes that foster parents will adopt his biological son Amanda [name].  Amanda [name] reports that father of the child who is currently in prison has equal rights of the child and patient does not believe that he  will sign over his parental rights.  Patient reports that his plan moving forward is to schedule a meeting with Department of Social Services and his lawyer to discuss next options if father of the child will not sign over parental rights.   Other stressors for patient are housing.  Patient reports that he is currently staying with a friend.  He reports that that friend has a Engineer, civil (consulting) and states that he may be having an allergic reaction to the dog or believes that he got a cold from his son Amanda [name].  This is made it difficult for him to continue to look for housing as he is not feeling 100%.  Suicidal/Homicidal: Nowithout intent/plan  Therapist Response:     Intervention/Plan: LCSW supportive therapy for praise and encouragement.  LCSW use psychoanalytic therapy for patient to express thoughts, feelings and emotions.  LCSW reviewed worksheet for "coping skills for anxiety".  This went over coping skills for deep breathing, progressive muscle relaxation, challenging irrational thoughts, and imagery.   Plan: Return again in 3  weeks.  Diagnosis: Schizoaffective disorder, depressive type ([place])  Collaboration of Care: Other none today   Patient/Guardian was advised Release of Information must be obtained prior  to any record release in order to collaborate their care with an outside provider. Patient/Guardian was advised if they have not already done so to contact the registration department to sign all necessary forms in order for [place] to release information regarding their care.   Consent: Patient/Guardian gives verbal consent for treatment and assignment of benefits for services provided during this visit. Patient/Guardian expressed understanding and agreed to proceed.   [name], LCSW 09/12/2022

## 2022-09-14 ENCOUNTER — Other Ambulatory Visit: Payer: Self-pay

## 2022-09-14 ENCOUNTER — Ambulatory Visit (INDEPENDENT_AMBULATORY_CARE_PROVIDER_SITE_OTHER): Payer: Medicare (Managed Care) | Admitting: Student in an Organized Health Care Education/Training Program

## 2022-09-14 ENCOUNTER — Encounter (HOSPITAL_COMMUNITY): Payer: Self-pay | Admitting: Student in an Organized Health Care Education/Training Program

## 2022-09-14 DIAGNOSIS — F251 Schizoaffective disorder, depressive type: Secondary | ICD-10-CM

## 2022-09-14 DIAGNOSIS — F411 Generalized anxiety disorder: Secondary | ICD-10-CM

## 2022-09-14 MED ORDER — FLUOXETINE HCL 20 MG PO CAPS
20.0000 mg | ORAL_CAPSULE | Freq: Every day | ORAL | 3 refills | Status: DC
  Filled 2022-09-14: qty 30, 30d supply, fill #0

## 2022-09-14 MED ORDER — ARIPIPRAZOLE 20 MG PO TABS
20.0000 mg | ORAL_TABLET | Freq: Every day | ORAL | 3 refills | Status: DC
  Filled 2022-09-14: qty 30, 30d supply, fill #0

## 2022-09-14 MED ORDER — TRAZODONE HCL 100 MG PO TABS
200.0000 mg | ORAL_TABLET | Freq: Every day | ORAL | 3 refills | Status: DC
  Filled 2022-09-14: qty 60, 30d supply, fill #0

## 2022-09-14 NOTE — Progress Notes (Signed)
BH MD/PA/NP OP Progress Note  09/14/2022 2:38 PM Amanda [name]  MRN:  [id number]  Chief Complaint:  Chief Complaint  Patient presents with   Follow-up   HPI: Amanda [name] is a 42 yo FTM patient with a psychiatric history of anxiety, schizophrenia, schizoaffective disorder bipolar type, bipolar 1, borderline personality, OCD (?), SI, and PTSD.   Supervising attending saw patient today as well.  Patient reports that she has been compliant with the following medication regimen:  Abilify 20 mg daily Prozac 20 mg daily Trazodone 200 mg nightly  Patient reports that unfortunately since seeing this provider she has become homeless.  Patient reports she keeps all of her medication on her so that is not tampered with.  Patient reports that she feels she is currently at her baseline.  Patient reports that she feels she is doing well on her meds.  Patient endorses that she feels she is handling being homeless fairly well and does have some support system should he get too cold or something happens to her.  Patient endorses having a good understanding of where she can go for meals and should she need certain necessities like bus passes.  Patient endorses unfortunately her lease was not renewed at her former apartment and she now owes $2100 due to some things they found in her apartment.  Patient reports that she is paying this off $200 a month using her disability.  Patient reports that overall her mood is still "pretty good" and she denies having issues sleeping or with appetite.  Patient reports that she is a bit anxious regarding the matter with her son.  Patient reports that her son's father will be getting out of jail next month and she has an upcoming court date.  Patient denies SI, HI and AVH.  Patient reports that she is now working on a new book, and she has a place downtown where she is able to get Wi-Fi and can type.  Patient endorses that all of her writings for her books are related to her  trauma and it helps her process the trauma.  Patient endorses feeling comfortable when she does this. Visit Diagnosis:    ICD-10-CM   1. Schizoaffective disorder, depressive type ([place])  F25.1 ARIPiprazole (ABILIFY) 20 MG tablet    FLUoxetine (PROZAC) 20 MG capsule    traZODone (DESYREL) 100 MG tablet    2. GAD (generalized anxiety disorder)  F41.1 FLUoxetine (PROZAC) 20 MG capsule    traZODone (DESYREL) 100 MG tablet      Past Psychiatric History: anxiety, schizophrenia, schizoaffective disorder bipolar type, bipolar 1, borderline personality, depression OCD, SI, and PTSD. Patient endorses multiple past inpatient psych hospitalization, and is not sure of the number. Patient endorses a hx of of these occurring in [place] and her last hosp at [facility] was 11/2020. Patient endorses hx of 100+ SA.  Previous medications: lamictal, Topomax, Seroquel (oversedated), and possibly Depakote in the past   Hx of stability on Abilify '20mg'$  and Prozac '20mg'$   Last visit: 05/2022: Medications were all continued.  Patient did appear to speak more about her PTSD symptoms, and a lot of her symptoms appear to be PTSD related.  Past Medical History:  Past Medical History:  Diagnosis Date   Anxiety    Asthma    Asthma due to environmental allergies    Asthma due to seasonal allergies    Bipolar 1 disorder ([place])    Borderline personality disorder ([place])    Brain  bleed (HCC)    Chronic post-traumatic stress disorder (PTSD)    complex chronic with psychotic features and self harm behaviors   Complication of anesthesia    Constipation    Dander (animal) allergy    Hearing loss    right ear   Heart murmur    denies seeing a cardiologist   Hip dysplasia    Hypertension    Gestional    Major depression, chronic    Mood disorder (HCC)    OCD (obsessive compulsive disorder)    Pneumonia    PONV (postoperative nausea and vomiting)    PTSD (post-traumatic stress disorder)    RA (rheumatoid  arthritis) ([place])    Rheumatoid arthritis ([place])    Schizophrenia ([place])    "I think that is wrong"   Seizure disorder ([place])    Suicidal ideations    Suicide attempt [facility])     Past Surgical History:  Procedure Laterality Date   Brain Shunt  1981   "a few hours old"   EYE SURGERY Bilateral [place] (IUD) INSERTION N/A 09/16/2018   Procedure: INTRAUTERINE DEVICE (IUD) INSERTION;  Surgeon: Lavonia Drafts, MD;  Location: [facility];  Service: Gynecology;  Laterality: N/A;   PERINEUM REPAIR N/A 09/16/2018   Procedure: EPISIOTOMY REVISION;  Surgeon: Lavonia Drafts, MD;  Location: [facility];  Service: Gynecology;  Laterality: N/A;   SHUNT REMOVAL  1983   Tooth Removal     multiple    Family Psychiatric History: Father: Alcohol use  Family History:  Family History  Problem Relation Age of Onset   Heart attack Mother    Stroke Mother    Liver cancer Mother    Diabetes Mother    Lung cancer Mother    Alcoholism Father    Sleep apnea Brother    Depression Brother    ADD / ADHD Brother    Diabetes Maternal Aunt    Diabetes Paternal Uncle    Colon cancer Paternal Uncle    Diabetes Maternal Grandmother    Dementia Maternal Grandmother     Social History:  Social History   Socioeconomic History   Marital status: Single    Spouse name: Not on file   Number of children: 1   Years of education: 12   Highest education level: 12th grade  Occupational History   Not on file  Tobacco Use   Smoking status: Every Day    Packs/day: 0.50    Years: 26.00    Total pack years: 13.00    Types: Cigarettes   Smokeless tobacco: Never  Vaping Use   Vaping Use: Former  Substance and Sexual Activity   Alcohol use: Not Currently   Drug use: Not Currently    Types: Marijuana    Comment: sober from from drugs    Sexual activity: Not Currently    Birth control/protection: None  Other Topics Concern   Not on file  Social History Narrative   ** Merged History  Encounter **       Lives with her fiance, friend, and fiance's brother   Right handed   Social Determinants of Health   Financial Resource Strain: Low Risk  (07/01/2020)   Overall Financial Resource Strain (CARDIA)    Difficulty of Paying Living Expenses: Not hard at all  Food Insecurity: No Food Insecurity (07/01/2020)   Hunger Vital Sign    Worried About Running Out of Food in the Last Year: Never true    Ran Out of Food  in the Last Year: Never true  Transportation Needs: Unmet Transportation Needs (07/01/2020)   PRAPARE - Transportation    Lack of Transportation (Medical): Yes    Lack of Transportation (Non-Medical): Yes  Physical Activity: Insufficiently Active (07/01/2020)   Exercise Vital Sign    Days of Exercise per Week: 3 days    Minutes of Exercise per Session: 40 min  Stress: No Stress Concern Present (07/01/2020)   [place]    Feeling of Stress : Not at all  Social Connections: Moderately Integrated (07/01/2020)   Social Connection and Isolation Panel [NHANES]    Frequency of Communication with Friends and Family: Three times a week    Frequency of Social Gatherings with Friends and Family: More than three times a week    Attends Religious Services: Never    Marine scientist or Organizations: Yes    Attends Music therapist: More than 4 times per year    Marital Status: Married    Allergies:  Allergies  Allergen Reactions   Aspartame And Phenylalanine Anaphylaxis, Hives, Diarrhea and Other (See Comments)    Artifical sweetners - diarrhea   Benadryl [Diphenhydramine] Anaphylaxis, Diarrhea and Other (See Comments)    Blisters   Other Anaphylaxis, Nausea And Vomiting, Rash and Other (See Comments)    Aspartame- Blisters Dust- Worsens asthma Ragweed- Worsens asthma, face gets red, and sneezing Animal Fur/Dander- Worsens asthma and sneezing     Scallops [Shellfish Allergy]  Anaphylaxis, Diarrhea and Nausea And Vomiting   Yellow Jacket Venom [Bee Venom] Anaphylaxis, Diarrhea, Nausea And Vomiting and Other (See Comments)    Seizures and numbness   Pollen Extract Other (See Comments)    Runny nose, eyes, and asthma worsens   Tegretol [Carbamazepine] Hives, Diarrhea and Other (See Comments)    Blisters in mouth and increase in seizures   Adhesive [Tape] Rash   Latex Hives, Rash and Other (See Comments)    Blisters, also- Condoms and dental encounters    Metabolic Disorder Labs: Lab Results  Component Value Date   HGBA1C 5.0 04/03/2022   MPG 96.8 04/03/2022   MPG 102.54 07/26/2021   Lab Results  Component Value Date   PROLACTIN 15.3 11/03/2021   PROLACTIN 39.6 (H) 07/26/2021   Lab Results  Component Value Date   CHOL 207 (H) 11/03/2021   TRIG 85 11/03/2021   HDL 47 11/03/2021   CHOLHDL 4.4 11/03/2021   VLDL 17 11/03/2021   LDLCALC 143 (H) 11/03/2021   LDLCALC 102 (H) 07/26/2021   Lab Results  Component Value Date   TSH 1.703 11/03/2021   TSH 2.220 07/26/2021    Therapeutic Level Labs: No results found for: "LITHIUM" No results found for: "VALPROATE" No results found for: "CBMZ"  Current Medications: Current Outpatient Medications  Medication Sig Dispense Refill   acetaminophen (TYLENOL) 500 MG tablet Take 500 mg by mouth every 6 (six) hours as needed (For rheumatoid arthritis and hip dysplasia).     ARIPiprazole (ABILIFY) 20 MG tablet Take 1 tablet (20 mg total) by mouth daily. 30 tablet 3   FLUoxetine (PROZAC) 20 MG capsule Take 1 capsule (20 mg total) by mouth daily for anxiety/ocd 15 capsule 1   FLUoxetine (PROZAC) 20 MG capsule Take 1 capsule (20 mg total) by mouth daily. 30 capsule 3   hydrOXYzine (ATARAX) 25 MG tablet Take 1 tablet (25 mg total) by mouth 3 (three) times daily as needed for anxiety. 90 tablet 3  Menthol, Topical Analgesic, (ICY HOT BACK EX) Apply 1 application  topically as needed (For back pain).     Multiple  Vitamin (MULTIVITAMIN WITH MINERALS) TABS tablet Take 1 tablet by mouth daily.     nitrofurantoin, macrocrystal-monohydrate, (MACROBID) 100 MG capsule Take 1 capsule (100 mg total) by mouth every 12 (twelve) hours.     traZODone (DESYREL) 100 MG tablet Take 2 tablets (200 mg total) by mouth at bedtime for insomnia 30 tablet 1   traZODone (DESYREL) 100 MG tablet Take 2 tablets (200 mg total) by mouth at bedtime. 60 tablet 3   No current facility-administered medications for this visit.     Musculoskeletal: Strength & Muscle Tone: within normal limits Gait & Station: normal Patient leans: N/A  Psychiatric Specialty Exam: Review of Systems  Psychiatric/Behavioral:  Negative for decreased concentration, dysphoric mood, sleep disturbance and suicidal ideas. The patient is not nervous/anxious.     Blood pressure (!) 142/76, pulse 73, resp. rate 12, weight 174 lb (78.9 kg), SpO2 99 %.Body mass index is 27.25 kg/m.  General Appearance: Casual malodorous  Eye Contact:  Good  Speech:  Clear and Coherent  Volume:  Normal  Mood:  Euthymic  Affect:  Appropriate  Thought Process:  Coherent  Orientation:  Full (Time, Place, and Person)  Thought Content: Logical   Suicidal Thoughts:  No  Homicidal Thoughts:  No  Memory:  Immediate;   Good Recent;   Good  Judgement:  Fair  Insight:  Good  Psychomotor Activity:  Normal  Concentration:  Concentration: Fair  Recall:  [place] of Knowledge: Good  Language: Good  Akathisia:  No  Handed:    AIMS (if indicated): not done  Assets:  Communication Skills Desire for Improvement Resilience Social Support Transportation  ADL's:  Intact  Cognition: WNL  Sleep:  Good   Screenings: AIMS    Flowsheet Row Admission (Discharged) from 07/27/2021 in [place] [facility] Admission (Discharged) from 11/30/2020 in [place] [facility] Admission (Discharged) from 11/01/2019 in [place] [facility] Admission (Discharged) from 11/10/2018 in [place] [facility]  AIMS Total Score 0 0 0 0      AUDIT    Flowsheet Row Admission (Discharged) from 07/27/2021 in [place] [facility] Admission (Discharged) from 11/01/2019 in [place] [facility] Admission (Discharged) from 11/10/2018 in [place] [facility]  Alcohol Use Disorder Identification Test Final Score (AUDIT) 0 5 0      GAD-7    Flowsheet Row Counselor from 09/12/2022 in [facility] Counselor from 07/23/2022 in [facility] Counselor from 12/13/2021 in [facility] Counselor from 11/15/2021 in [facility] Counselor from 10/17/2021 in [facility]  Total GAD-7 Score '3 6 2 5 2      '$ [place] Office Visit from 09/28/2019 in [facility]  Total Score (max 30 points ) 17      PHQ2-9    Flowsheet Row Counselor from 09/12/2022 in [facility] Counselor from 07/23/2022 in [facility] from 04/03/2022 in [facility] Counselor from 12/13/2021 in [facility] Counselor from 11/15/2021 in [facility]  PHQ-2 Total Score 0 1 2 0 1  PHQ-9 Total Score '3 6 5 '$ 2  [place] ED from 04/03/2022 in [facility] ED from 01/22/2022 in [facility] Department at [facility] Counselor from 11/15/2021 in [place] High Risk No Risk Error: Q3, 4, or 5 should not be populated when Q2 is No        Assessment and Plan:  Amanda [name] is a 42 yo FTM patient with a psychiatric history of anxiety, schizophrenia,  schizoaffective disorder bipolar type, bipolar 1, borderline personality, OCD (?), SI, and PTSD.  Despite homelessness and continued stressors related to her son, patient appears to be doing the best that this provider has seen.  Patient is coping fairly well with her stressors and is able to identify positive coping skills.  Patient is well versed that her resources in the community and knows where to go for certain needs.  Patient herself endorsed feeling that she is at her baseline.  No adjustments will be made.  At this time it does continue to appear that PTSD is actually the crux of a lot of patient's symptoms.  However, patient does have a history of being at her best on her current dose of medications and this continues to be true.  PTSD VS schizoaffective disorder, bipolar type GAD Borderline personality disorder, history - Continue Abilify '20mg'$  daily - Continue Prozac '20mg'$  daily - Continue Trazodone '200mg'$  QH  Follow-up in 3 months  Collaboration of Care: Collaboration of Care:   Patient/Guardian was advised Release of Information must be obtained prior to any record release in order to collaborate their care with an outside provider. Patient/Guardian was advised if they have not already done so to contact the registration department to sign all necessary forms in order for [place] to release information regarding their care.   Consent: Patient/Guardian gives verbal consent for treatment and assignment of benefits for services provided during this visit. Patient/Guardian expressed understanding and agreed to proceed.   PGY-3 Freida Busman, MD 09/14/2022, 2:38 PM

## 2022-09-20 ENCOUNTER — Other Ambulatory Visit: Payer: Self-pay

## 2022-10-02 ENCOUNTER — Ambulatory Visit (INDEPENDENT_AMBULATORY_CARE_PROVIDER_SITE_OTHER): Payer: Medicare (Managed Care) | Admitting: Licensed Clinical Social Worker

## 2022-10-02 DIAGNOSIS — F411 Generalized anxiety disorder: Secondary | ICD-10-CM

## 2022-10-02 DIAGNOSIS — F251 Schizoaffective disorder, depressive type: Secondary | ICD-10-CM | POA: Diagnosis not present

## 2022-10-02 NOTE — Progress Notes (Signed)
THERAPIST PROGRESS NOTE  Session Time: 30  Participation Level: Active  Behavioral Response: CasualAlertAnxious and Depressed  Type of Therapy: Individual Therapy  Treatment Goals addressed:  Active     Depression     STG: Amanda "Amanda [name]" WILL COMPLETE AT LEAST 80% OF ASSIGNED HOMEWORK (Progressing)     Start:  09/25/21    Expected End:  10/18/22         STG: Reduce overall depression score by a minimum of 25% on the Patient Health Questionnaire (PHQ-9) or the Montgomery-Asberg Depression Rating Scale (MADRS) (Completed/Met)     Start:  09/25/21    Expected End:  05/18/22    Resolved:  12/13/21        Walk 3 x weekly  (Completed/Met)     Start:  10/17/21    Expected End:  10/18/22    Resolved:  08/21/22       Goal Note     Pt reports walking daily          Listening to classical or write 3 x weekly  (Completed/Met)     Start:  10/17/21    Expected End:  10/18/22    Resolved:  10/02/22       Goal Note     Pt reports listening to classical music to help decrease depression and anxiety           Identify 3 trigger for depression and anxiety  (Progressing)     Start:  10/17/21    Expected End:  10/18/22          Goal Note     Pending adoption for son  Housing was evicted from residents 60 days ago.          Decrease IP psych admission to less than 1 x per year.  (Progressing)     Start:  10/17/21    Expected End:  10/18/22          Goal Note     Pt reports no IP psych in 6 months            ProgressTowards Goals: Progressing  Interventions: CBT, Motivational Interviewing, and Supportive  Summary: Amanda [name] is a 42 y.o. adult who presents with depressed and anxious mood\affect.  Patient was pleasant, cooperative, maintained good eye contact.  Amanda [name] was alert and oriented x 5.  Patient comes in today with primary stressors as legal.  Patient reports that he has given out his "legal rights to his son".  This has been an ongoing legal  battle with patient and Department of Social Services for the past year and a half.  Patient reports that this is the best thing for his son.  Patient reports that the neck step is for her ex-boyfriend to sign over his custodial rights to the son after he gets out of prison on Friday.  Patient states that the next court date is this Friday.  Amanda [name] believes that the court case will be continued as her ex will just be getting out of prison and will not be able to make it in time for the hearing.  Patient reports utilizing coping skills such as listening class" music and writing.  Suicidal/Homicidal: Nowithout intent/plan  Therapist Response:     Intervention/Plan: LCSW supportive therapy for praise and encouragement.  LCSW used psychoanalytic therapy for patient to express thoughts, feelings and emotions.  LCSW used motivational interviewing for positive affirmations.  LCSW spoke about coping skills today for listening to classical music  and patient reports utilizing those coping skills at least 3 times weekly as evidenced by treatment plan updated above.  LCSW spoke about triggers for anxiety such as housing and legal case with son.  Plan: Return again in 3  weeks.  Diagnosis: Schizoaffective disorder, depressive type ([place])  GAD (generalized anxiety disorder)  Collaboration of Care: Other None today   Patient/Guardian was advised Release of Information must be obtained prior to any record release in order to collaborate their care with an outside provider. Patient/Guardian was advised if they have not already done so to contact the registration department to sign all necessary forms in order for [place] to release information regarding their care.   Consent: Patient/Guardian gives verbal consent for treatment and assignment of benefits for services provided during this visit. Patient/Guardian expressed understanding and agreed to proceed.   [name], LCSW 10/02/2022

## 2022-10-27 ENCOUNTER — Encounter: Payer: Self-pay | Admitting: Pediatric Intensive Care

## 2022-10-27 LAB — AMB RESULTS CONSOLE CBG: Glucose: 108

## 2022-10-27 NOTE — Progress Notes (Signed)
Patient identified as [facility] resident. Resources provided

## 2022-10-27 NOTE — Progress Notes (Signed)
Will see Marliss Coots FNP at [facility] for follow up. Lisette Abu BSN RN CNNP [phone]

## 2022-10-30 ENCOUNTER — Ambulatory Visit (INDEPENDENT_AMBULATORY_CARE_PROVIDER_SITE_OTHER): Payer: Medicare (Managed Care) | Admitting: Licensed Clinical Social Worker

## 2022-10-30 DIAGNOSIS — F251 Schizoaffective disorder, depressive type: Secondary | ICD-10-CM

## 2022-10-30 NOTE — Progress Notes (Signed)
THERAPIST PROGRESS NOTE  Session Time: 32  Participation Level: Active  Behavioral Response: CasualAlertAnxious and Depressed  Type of Therapy: Individual Therapy  Treatment Goals addressed:  Active     Depression     STG: [name] WILL COMPLETE AT LEAST 80% OF ASSIGNED HOMEWORK (Progressing)     Start:  09/25/21    Expected End:  04/19/23         STG: Reduce overall depression score by a minimum of 25% on the Patient Health Questionnaire (PHQ-9) or the Montgomery-Asberg Depression Rating Scale (MADRS) (Completed/Met)     Start:  09/25/21    Expected End:  05/18/22    Resolved:  12/13/21        Walk 3 x weekly  (Completed/Met)     Start:  10/17/21    Expected End:  10/18/22    Resolved:  08/21/22       Goal Note     Pt reports walking daily          Listening to classical or write 3 x weekly  (Completed/Met)     Start:  10/17/21    Expected End:  10/18/22    Resolved:  10/02/22       Goal Note     Pt reports listening to classical music to help decrease depression and anxiety           Identify 3 trigger for depression and anxiety  (Progressing)     Start:  10/17/21    Expected End:  04/19/23          Goal Note     Stress from open vs closed adoption of son.          Decrease IP psych admission to less than 1 x per year.  (Progressing)     Start:  10/17/21    Expected End:  04/19/23          Goal Note     No hospital admission since 04/03/22             ProgressTowards Goals: Progressing  Interventions: CBT, Motivational Interviewing, and Supportive  Summary: Amanda [name] is a 42 y.o. adult who presents with anxious mood\affect.  Patient was pleasant, cooperative, maintained good eye contact.  He engaged well in therapy session was dressed casually.  Amanda [name] comes in today with primary stressors as legal.  He endorses symptoms for restlessness, tension, and worry.  He denies any auditory or visual hallucinations at this time.    He states "became my mind occupied on my writing I do not think about the adoption".  Patient is still concerned whether the adoption of his biological son will be open or closed.  Amanda [name] states that this will determine whether he stays in [facility] or move to a different state with a friend.  Amanda [name] reports that he still wants confirmation from his friend that he can go and live with them as patient reports that his friend was drunk when he told him he could come to them.  Suicidal/Homicidal: Nowithout intent/plan  Therapist Response:    Intervention/Plan: LCSW use psychoanalytic therapy for patient to express thoughts, feelings and emotions.  LCSW used supportive therapy for praise and encouragement.  LCSW educated patient on symptoms of schizo affect disorder depressive type.  LCSW educated patient on taking medications as prescribed.  Amanda [name] reports compliance with medication 7 out of 7 days/week.  Plan: Return again in 4 weeks.  Diagnosis: Schizoaffective disorder, depressive type ([place])  Collaboration of Care: Other None today   Patient/Guardian was advised Release of Information must be obtained prior to any record release in order to collaborate their care with an outside provider. Patient/Guardian was advised if they have not already done so to contact the registration department to sign all necessary forms in order for [place] to release information regarding their care.   Consent: Patient/Guardian gives verbal consent for treatment and assignment of benefits for services provided during this visit. Patient/Guardian expressed understanding and agreed to proceed.   [name], LCSW 10/30/2022

## 2022-11-02 ENCOUNTER — Encounter: Payer: Self-pay | Admitting: *Deleted

## 2022-11-02 NOTE — Progress Notes (Addendum)
Pt attended 10/27/22 screening event where b/p was 144/79. Event RN documented pt to f/u at [facility], which pt listed as contact info, for [place] pt given SDOH resources. In-basket to [place] sent to collaborate ongoing f/u. CHL documents pt has [employer] and is listed as having Scientist, research (physical sciences) and Wellness PCP Dr. Margarita Rana, with ongoing [place] support approximately every 2 weeks. In-basket message sent to pt's [facility] counselor of record in [facility] "Jules Tilisa Detorres attended a 11/03/22 [employer] equity screening event at the [place] on 11/03/22, and listed the [employer] [facility] as his home address. At that event, his b/p was slightly elevated at 144/79; blood sugar was 108. We have been unable to contact him by phone and have left messages with the [place] nurse to help f/u.  This pt has actually seen Dr.Newlin at the [facility]) in the past - it would be advisable for him to f/u for some preventive medical care and HTN f/u but we have been unable to contact him. CHL review indicates pt may be seeing you so we hoped we could give you this information, perhaps let the pt know that he can still access care with Dr. Margarita Rana (or other PCP if he prefers). [facility], Dr. Smitty Pluck office contact # is [phone]. Thank you for letting us share this information with you in hopes pt can access primary care as needed also. [facility] team, Yvonna Alanis, RN" Care team member - LSW from [facility] responded: "[name], LCSW  Danette Weinfeld, Cooper Render, RN "I can advise pt on this information on f/u in April. - Adam" As of November 20, 2022, note from LCSW:  "[place], Baruch Merl, [place]  Fortunato Nordin, Cooper Render, RN Carlos Levering, talked to [place] today. I will document it in my note today. He is following up with [facility] as is easier to get to for transportation. Please let me know if you have any questions or concerns. Adam"

## 2022-11-20 ENCOUNTER — Ambulatory Visit (INDEPENDENT_AMBULATORY_CARE_PROVIDER_SITE_OTHER): Payer: Medicare (Managed Care) | Admitting: Licensed Clinical Social Worker

## 2022-11-20 DIAGNOSIS — F251 Schizoaffective disorder, depressive type: Secondary | ICD-10-CM

## 2022-11-20 DIAGNOSIS — F4310 Post-traumatic stress disorder, unspecified: Secondary | ICD-10-CM

## 2022-11-20 NOTE — Progress Notes (Signed)
THERAPIST PROGRESS NOTE  Session Time: 59   Participation Level: Active  Behavioral Response: CasualAlertAnxious  Type of Therapy: Individual Therapy  Treatment Goals addressed:  Active     Depression     STG: Amanda "Amanda [name]" WILL COMPLETE AT LEAST 80% OF ASSIGNED HOMEWORK (Progressing)     Start:  09/25/21    Expected End:  04/19/23         STG: Reduce overall depression score by a minimum of 25% on the Patient Health Questionnaire (PHQ-9) or the Montgomery-Asberg Depression Rating Scale (MADRS) (Completed/Met)     Start:  09/25/21    Expected End:  05/18/22    Resolved:  12/13/21        Walk 3 x weekly  (Completed/Met)     Start:  10/17/21    Expected End:  10/18/22    Resolved:  08/21/22       Goal Note     Pt reports walking daily          Listening to classical or write 3 x weekly  (Completed/Met)     Start:  10/17/21    Expected End:  10/18/22    Resolved:  10/02/22       Goal Note     Pt reports listening to classical music to help decrease depression and anxiety           Identify 3 trigger for depression and anxiety  (Progressing)     Start:  10/17/21    Expected End:  04/19/23            Decrease IP psych admission to less than 1 x per year.  (Progressing)     Start:  10/17/21    Expected End:  04/19/23               ProgressTowards Goals: Progressing  Interventions: CBT, Motivational Interviewing, and Supportive  Summary: Amanda [name] is a 42 y.o. adult who presents with depressed and anxious mood\affect.  Patient was pleasant, cooperative, maintained good eye contact.  He engaged well in therapy session was dressed casually.  Patient reports primary stressors as family conflict, legal, and housing.  LCSW notes that message in regards to gentleman stating that he attended a wellness clinic through the  [facility].  Nurse messaging LCSW notes elevated blood sugar and elevated blood pressure.  They recommend that Amanda [name] follow-up with primary  care physician.  Patient reports that he is given follow-up with [facility] health clinic in [place].   Patient reports family conflict for cousin wanting patient to plan his grandmother's grandfather's memorial services in [place].  Amanda [name] reports tension and worry to do this as he is out of state and also the memorial services need to be at 2 different places as grandma and grandpa played into different religions.   Patient also states that he is stressed about legal issues with his biological son who recently underwent parental rights over to DSS.  Suicidal/Homicidal: Nowithout intent/plan  Therapist Response:     Intervention/Plan: LCSW supportive therapy for praise and encouragement.  LCSW master GAD-7.  LCSW administered the PHQ-9.  LCSW reviewed scores with patient.  LCSW psychoanalytic therapy for patient to express thoughts, feelings and concerns in session.  Plan: Return again in 3 weeks.  Diagnosis: Schizoaffective disorder, depressive type  PTSD (post-traumatic stress disorder)  Collaboration of Care: Other None today  Patient/Guardian was advised Release of Information must be obtained prior to any record release in order to collaborate  their care with an outside provider. Patient/Guardian was advised if they have not already done so to contact the registration department to sign all necessary forms in order for [place] to release information regarding their care.   Consent: Patient/Guardian gives verbal consent for treatment and assignment of benefits for services provided during this visit. Patient/Guardian expressed understanding and agreed to proceed.   [name], LCSW 11/20/2022
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
## 2022-12-04 ENCOUNTER — Encounter (HOSPITAL_COMMUNITY): Payer: Self-pay | Admitting: Psychiatry

## 2022-12-04 ENCOUNTER — Other Ambulatory Visit: Payer: Self-pay

## 2022-12-04 ENCOUNTER — Ambulatory Visit (INDEPENDENT_AMBULATORY_CARE_PROVIDER_SITE_OTHER): Payer: Medicare (Managed Care) | Admitting: Psychiatry

## 2022-12-04 VITALS — BP 127/81 | HR 63 | Ht 67.0 in | Wt 171.0 lb

## 2022-12-04 DIAGNOSIS — F4310 Post-traumatic stress disorder, unspecified: Secondary | ICD-10-CM

## 2022-12-04 DIAGNOSIS — F411 Generalized anxiety disorder: Secondary | ICD-10-CM | POA: Diagnosis not present

## 2022-12-04 DIAGNOSIS — F1091 Alcohol use, unspecified, in remission: Secondary | ICD-10-CM

## 2022-12-04 DIAGNOSIS — F603 Borderline personality disorder: Secondary | ICD-10-CM

## 2022-12-04 DIAGNOSIS — F251 Schizoaffective disorder, depressive type: Secondary | ICD-10-CM | POA: Diagnosis not present

## 2022-12-04 DIAGNOSIS — F1911 Other psychoactive substance abuse, in remission: Secondary | ICD-10-CM

## 2022-12-04 MED ORDER — FLUOXETINE HCL 20 MG PO CAPS
20.0000 mg | ORAL_CAPSULE | Freq: Every day | ORAL | 3 refills | Status: DC
  Filled 2022-12-04: qty 30, 30d supply, fill #0

## 2022-12-04 MED ORDER — TRAZODONE HCL 100 MG PO TABS
200.0000 mg | ORAL_TABLET | Freq: Every day | ORAL | 3 refills | Status: DC
  Filled 2022-12-04: qty 60, 30d supply, fill #0

## 2022-12-04 MED ORDER — HYDROXYZINE HCL 25 MG PO TABS
25.0000 mg | ORAL_TABLET | Freq: Three times a day (TID) | ORAL | 3 refills | Status: DC | PRN
  Filled 2022-12-04: qty 90, 30d supply, fill #0

## 2022-12-04 MED ORDER — ARIPIPRAZOLE 20 MG PO TABS
20.0000 mg | ORAL_TABLET | Freq: Every day | ORAL | 3 refills | Status: DC
  Filled 2022-12-04: qty 30, 30d supply, fill #0

## 2022-12-04 NOTE — Patient Instructions (Signed)
Thank you for attending your appointment today.  -- We did not make any medication changes today. Please continue medications as prescribed.  Please do not make any changes to medications without first discussing with your provider. If you are experiencing a psychiatric emergency, please call 911 or present to your nearest emergency department. Additional crisis, medication management, and therapy resources are included below.  [facility]  [address], [place], [place] [address] [phone] WALK-IN URGENT CARE 24/7 FOR ANYONE [address], [place], [place]  [phone] Fax: [phone] [email or web address] *Interpreters available *Accepts all insurance and uninsured for Urgent Care needs *Accepts Medicaid and uninsured for outpatient treatment (below)      ONLY FOR [place] Residents  Below:    Outpatient New Patient Assessment/Therapy Walk-ins:        Monday -Thursday 8am until slots are full.        Every Friday 1pm-4pm  (first come, first served)                   New Patient Psychiatry/Medication Management        Monday-Friday 8am-11am (first come, first served)               For all walk-ins we ask that you arrive by 7:15am, because patients will be seen in the order of arrival.   

## 2022-12-11 ENCOUNTER — Other Ambulatory Visit: Payer: Self-pay

## 2022-12-17 ENCOUNTER — Ambulatory Visit (INDEPENDENT_AMBULATORY_CARE_PROVIDER_SITE_OTHER): Payer: Medicare (Managed Care) | Admitting: Licensed Clinical Social Worker

## 2022-12-17 DIAGNOSIS — F251 Schizoaffective disorder, depressive type: Secondary | ICD-10-CM

## 2022-12-17 DIAGNOSIS — F4310 Post-traumatic stress disorder, unspecified: Secondary | ICD-10-CM | POA: Diagnosis not present

## 2022-12-17 NOTE — Progress Notes (Signed)
THERAPIST PROGRESS NOTE  Session Time: 30  Participation Level: Active  Behavioral Response: CasualAlertAnxious and Depressed  Type of Therapy: Individual Therapy  Treatment Goals addressed:  Active     Depression     STG: Amal "Amanda [name]" WILL COMPLETE AT LEAST 80% OF ASSIGNED HOMEWORK (Progressing)     Start:  09/25/21    Expected End:  04/19/23         STG: Reduce overall depression score by a minimum of 25% on the Patient Health Questionnaire (PHQ-9) or the Montgomery-Asberg Depression Rating Scale (MADRS) (Completed/Met)     Start:  09/25/21    Expected End:  05/18/22    Resolved:  12/13/21        Walk 3 x weekly  (Completed/Met)     Start:  10/17/21    Expected End:  10/18/22    Resolved:  08/21/22       Goal Note     Pt reports walking daily          Listening to classical or write 3 x weekly  (Completed/Met)     Start:  10/17/21    Expected End:  10/18/22    Resolved:  10/02/22       Goal Note     Pt reports listening to classical music to help decrease depression and anxiety           Identify 3 trigger for depression and anxiety  (Progressing)     Start:  10/17/21    Expected End:  04/19/23          Goal Note     Family          Decrease IP psych admission to less than 1 x per year.  (Progressing)     Start:  10/17/21    Expected End:  04/19/23          Goal Note     No reported hospital visits in the last year.              ProgressTowards Goals: Progressing  Interventions: CBT and Motivational Interviewing  Summary: Amanda [name] is a 42 y.o. adult who presents with depressed and anxious mood\affect.  Patient was pleasant, cooperative, maintained good eye contact.  He engaged well in therapy session was dressed casually.  Patient comes in today with primary stressors as family conflict.  Patient reports that he was woken up at 3 AM this morning to his cousin attempting to call him and convince him to move back to [facility]  Patient reports tension and worry about moving back if he were to go back due to family conflict between the family and him.  Patient reports he is okay with visiting but not moving back full-time.   Other stressors for patient is housing.  Patient remains with his primary housing as a tent in an undisclosed location.  Patient reports that he has been looking for subsidized housing for the past several months but has had no luck finding open spots.  Amanda [name] reports no update on his legal case with Department [employer] Social Services other than he has given up parental rights.  Patient reports he is still optimistic that it can be an open ended adoption where he can still remain in his son's life.  Suicidal/Homicidal: Nowithout intent/plan  Therapist Response:    Intervention/Plan LCSW spoke with patient about coping skills for writing, drawing, and playing with her animal Luna.  LCSW educated patient on the signs and  symptoms of schizo affective disorder LCSW safety planned with patient due to history of suicidal and homicidal ideations.  Amanda [name] reports no suicidal or homicidal ideations since last session.  LCSW spoke with patient about setting healthy boundaries with family.   Plan: Return again in 3 weeks.  Diagnosis: Schizoaffective disorder, depressive type (HCC)  PTSD (post-traumatic stress disorder)  Collaboration of Care: Other None today   Patient/Guardian was advised Release of Information must be obtained prior to any record release in order to collaborate their care with an outside provider. Patient/Guardian was advised if they have not already done so to contact the registration department to sign all necessary forms in order for [place] to release information regarding their care.   Consent: Patient/Guardian gives verbal consent for treatment and assignment of benefits for services provided during this visit. Patient/Guardian expressed understanding and agreed to proceed.   [name], LCSW 12/17/2022

## 2023-01-09 ENCOUNTER — Ambulatory Visit (INDEPENDENT_AMBULATORY_CARE_PROVIDER_SITE_OTHER): Payer: Medicare (Managed Care) | Admitting: Licensed Clinical Social Worker

## 2023-01-09 DIAGNOSIS — F251 Schizoaffective disorder, depressive type: Secondary | ICD-10-CM | POA: Diagnosis not present

## 2023-01-09 DIAGNOSIS — F4310 Post-traumatic stress disorder, unspecified: Secondary | ICD-10-CM
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
## 2023-01-11 ENCOUNTER — Encounter: Payer: Self-pay | Admitting: *Deleted

## 2023-01-11 NOTE — Progress Notes (Unsigned)
Pt attended screening event on 10/27/22, where Pt screening result was BP 144/79 and Glucose 108. During initial follow up, Health equity team member discover that Pt was following up with [facility] as it is easier to get to for transportation. Pt also was provided with resources at the event. Pt was contacted for 60 day follow up but was not successful. Per chart review, [facility] is listed as part of Pt care team in [facility] but recent visit are not visible. Pt has ongoing Behavioral health visit and upcoming appointment on 02/20/23,02/26/23, and 03/19/23. Health equity team member called [facility] to verify if he is established Pt. [facility] front desk informed caller that Pt is not established at [facility] due to Pt canceling his first appointment. A letter was sent to Pt with [facility] and [facility]. Food and housing resources were also mailed to Pt as it has been marked at [facility] wheel. In-Basket message was also sent to Pt Behavioral health care team.

## 2023-02-20 ENCOUNTER — Ambulatory Visit (HOSPITAL_COMMUNITY): Payer: Medicare (Managed Care) | Admitting: Licensed Clinical Social Worker

## 2023-02-20 DIAGNOSIS — F251 Schizoaffective disorder, depressive type: Secondary | ICD-10-CM

## 2023-02-20 DIAGNOSIS — F411 Generalized anxiety disorder: Secondary | ICD-10-CM

## 2023-02-20 NOTE — Progress Notes (Signed)
THERAPIST PROGRESS NOTE  Virtual Visit via Video Note  I connected with Amanda [name] on 02/20/23 at 11:00 AM EDT by a video enabled telemedicine application and verified that I am speaking with the correct person using two identifiers.  Location: Patient: [facility]  Provider: Providers Home    I discussed the limitations of evaluation and management by telemedicine and the availability of in person appointments. The patient expressed understanding and agreed to proceed.     I discussed the assessment and treatment plan with the patient. The patient was provided an opportunity to ask questions and all were answered. The patient agreed with the plan and demonstrated an understanding of the instructions.   The patient was advised to call back or seek an in-person evaluation if the symptoms worsen or if the condition fails to improve as anticipated.  I provided 30  minutes of non-face-to-face time during this encounter.   [name], LCSW   Participation Level: Active  Behavioral Response: CasualAlertAnxious and Depressed  Type of Therapy: Individual Therapy  Treatment Goals addressed:  Active     Depression     STG: Amanda "Amanda [name]" WILL COMPLETE AT LEAST 80% OF ASSIGNED HOMEWORK (Progressing)     Start:  09/25/21    Expected End:  04/19/23         STG: Reduce overall depression score by a minimum of 25% on the Patient Health Questionnaire (PHQ-9) or the Montgomery-Asberg Depression Rating Scale (MADRS) (Completed/Met)     Start:  09/25/21    Expected End:  05/18/22    Resolved:  12/13/21        Walk 3 x weekly  (Completed/Met)     Start:  10/17/21    Expected End:  10/18/22    Resolved:  08/21/22       Goal Note     Pt reports walking daily          Listening to classical or write 3 x weekly  (Completed/Met)     Start:  10/17/21    Expected End:  10/18/22    Resolved:  10/02/22       Goal Note     Pt reports listening to classical music to  help decrease depression and anxiety           Identify 3 trigger for depression and anxiety  (Progressing)     Start:  10/17/21    Expected End:  04/19/23          Goal Note     Housing          Decrease IP psych admission to less than 1 x per year.  (Completed/Met)     Start:  10/17/21    Expected End:  04/19/23    Resolved:  02/20/23       Goal Note     Pt has had no IP admission in the last year             ProgressTowards Goals: Progressing  Interventions: CBT and Motivational Interviewing  Summary: Amanda [name] is a 42 y.o. adult who presents with anxious mood\affect.  Patient was pleasant, cooperative, maintained good eye contact.  Amanda [name] was alert and oriented x 5.  Patient endorses symptoms for night terrors, tension, worry.  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  Patient reports that he is still struggling finding housing and is currently living in a tent.  Patient reports that he twisted his ankle walking his  dog earlier in the week.  Patient reports that he is getting some relief from Tylenol and elevation of his ankle.  Patient reports that he continues to try to work on multiple blocks and is frustrated with writer's block.  Suicidal/Homicidal: Nowithout intent/plan  Therapist Response:     Interventions/Plan: LCSW psychoanalytic therapy for patient to express thoughts, feelings and concerns.  LCSW used unconditional positive regard utilizing person centered therapy for nonjudgmental stances.  LCSW utilized coping skills for positive affirmations prior to bed to help decrease night terrors.  LCSW spoke with patient about different resources such as reaching out to writer's in the community to help with her writer's block.    Plan: Return again in 3 weeks.  Diagnosis: Schizoaffective disorder, depressive type (HCC)  GAD (generalized anxiety disorder)  Collaboration of Care: Other None today   Patient/Guardian was advised  Release of Information must be obtained prior to any record release in order to collaborate their care with an outside provider. Patient/Guardian was advised if they have not already done so to contact the registration department to sign all necessary forms in order for [place] to release information regarding their care.   Consent: Patient/Guardian gives verbal consent for treatment and assignment of benefits for services provided during this visit. Patient/Guardian expressed understanding and agreed to proceed.   [name], LCSW 02/20/2023
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
## 2023-02-26 ENCOUNTER — Ambulatory Visit (INDEPENDENT_AMBULATORY_CARE_PROVIDER_SITE_OTHER): Payer: Medicare (Managed Care) | Admitting: Psychiatry

## 2023-02-26 ENCOUNTER — Other Ambulatory Visit: Payer: Self-pay

## 2023-02-26 ENCOUNTER — Encounter (HOSPITAL_COMMUNITY): Payer: Self-pay | Admitting: Psychiatry

## 2023-02-26 VITALS — BP 124/83 | HR 63 | Resp 16 | Ht 67.0 in | Wt 161.4 lb

## 2023-02-26 DIAGNOSIS — F4310 Post-traumatic stress disorder, unspecified: Secondary | ICD-10-CM | POA: Diagnosis not present

## 2023-02-26 DIAGNOSIS — F411 Generalized anxiety disorder: Secondary | ICD-10-CM | POA: Diagnosis not present

## 2023-02-26 DIAGNOSIS — F251 Schizoaffective disorder, depressive type: Secondary | ICD-10-CM | POA: Diagnosis not present

## 2023-02-26 DIAGNOSIS — F603 Borderline personality disorder: Secondary | ICD-10-CM | POA: Diagnosis not present

## 2023-02-26 MED ORDER — HYDROXYZINE HCL 25 MG PO TABS
25.0000 mg | ORAL_TABLET | Freq: Three times a day (TID) | ORAL | 2 refills | Status: DC | PRN
  Filled 2023-02-26: qty 90, 30d supply, fill #0

## 2023-02-26 MED ORDER — ARIPIPRAZOLE 20 MG PO TABS
20.0000 mg | ORAL_TABLET | Freq: Every day | ORAL | 2 refills | Status: DC
  Filled 2023-02-26: qty 30, 30d supply, fill #0

## 2023-02-26 MED ORDER — TRAZODONE HCL 100 MG PO TABS
200.0000 mg | ORAL_TABLET | Freq: Every day | ORAL | 2 refills | Status: DC
  Filled 2023-02-26: qty 60, 30d supply, fill #0

## 2023-02-26 MED ORDER — FLUOXETINE HCL 40 MG PO CAPS
40.0000 mg | ORAL_CAPSULE | Freq: Every day | ORAL | 2 refills | Status: DC
  Filled 2023-02-26: qty 30, 30d supply, fill #0

## 2023-02-26 NOTE — Patient Instructions (Signed)
Thank you for attending your appointment today.  -- INCREASE Prozac to 40 mg daily -- Continue other medications as prescribed.  Please do not make any changes to medications without first discussing with your provider. If you are experiencing a psychiatric emergency, please call 911 or present to your nearest emergency department. Additional crisis, medication management, and therapy resources are included below.  [facility]  [address], [place], [place] [address] [phone] WALK-IN URGENT CARE 24/7 FOR ANYONE [address], [place], [place]  [phone] Fax: [phone] [email or web address] *Interpreters available *Accepts all insurance and uninsured for Urgent Care needs *Accepts Medicaid and uninsured for outpatient treatment (below)      ONLY FOR [place] Residents  Below:    Outpatient New Patient Assessment/Therapy Walk-ins:        Monday -Thursday 8am until slots are full.        Every Friday 1pm-4pm  (first come, first served)                   New Patient Psychiatry/Medication Management        Monday-Friday 8am-11am (first come, first served)               For all walk-ins we ask that you arrive by 7:15am, because patients will be seen in the order of arrival.   

## 2023-03-07 ENCOUNTER — Other Ambulatory Visit: Payer: Self-pay

## 2023-03-19 ENCOUNTER — Ambulatory Visit (INDEPENDENT_AMBULATORY_CARE_PROVIDER_SITE_OTHER): Payer: Medicare (Managed Care) | Admitting: Licensed Clinical Social Worker

## 2023-03-19 DIAGNOSIS — F251 Schizoaffective disorder, depressive type: Secondary | ICD-10-CM | POA: Diagnosis not present
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
## 2023-04-17 ENCOUNTER — Ambulatory Visit (INDEPENDENT_AMBULATORY_CARE_PROVIDER_SITE_OTHER): Payer: Medicare (Managed Care) | Admitting: Licensed Clinical Social Worker

## 2023-04-17 DIAGNOSIS — F251 Schizoaffective disorder, depressive type: Secondary | ICD-10-CM

## 2023-04-17 DIAGNOSIS — F4310 Post-traumatic stress disorder, unspecified: Secondary | ICD-10-CM

## 2023-04-17 NOTE — Progress Notes (Signed)
THERAPIST PROGRESS NOTE  Session Time: 30  Participation Level: Active  Behavioral Response: CasualAlertAnxious  Type of Therapy: Individual Therapy  Treatment Goals addressed:  Active     Depression     STG: Amanda "Amanda [name]" WILL COMPLETE AT LEAST 80% OF ASSIGNED HOMEWORK (Progressing)     Start:  09/25/21    Expected End:  04/19/23         STG: Reduce overall depression score by a minimum of 25% on the Patient Health Questionnaire (PHQ-9) or the Montgomery-Asberg Depression Rating Scale (MADRS) (Completed/Met)     Start:  09/25/21    Expected End:  05/18/22    Resolved:  12/13/21        Walk 3 x weekly  (Completed/Met)     Start:  10/17/21    Expected End:  10/18/22    Resolved:  08/21/22       Goal Note     Pt reports walking daily          Listening to classical or write 3 x weekly  (Completed/Met)     Start:  10/17/21    Expected End:  10/18/22    Resolved:  10/02/22       Goal Note     Pt reports listening to classical music to help decrease depression and anxiety           Identify 3 trigger for depression and anxiety  (Progressing)     Start:  10/17/21    Expected End:  04/19/23          Goal Note     Family conflict           Decrease IP psych admission to less than 1 x per year.  (Completed/Met)     Start:  10/17/21    Expected End:  04/19/23    Resolved:  02/20/23       Goal Note     Pt has had no IP admission in the last year          WORK WITH Amanda "Amanda [name]" TO TRACK SYMPTOMS, TRIGGERS AND/OR SKILL USE THROUGH A MOOD CHART, DIARY CARD, OR JOURNAL (Completed)     Start:  09/25/21    End:  12/13/21      ENCOURAGE Amanda "Amanda [name]" TO PARTICIPATE IN RECOVERY PEER SUPPORT ACTIVITIES WEEKLY (Completed)     Start:  09/25/21    End:  01/02/22      PROVIDE Amanda "Amanda [name]" WITH EDUCATIONAL INFORMATION AND READING MATERIAL ON DISSOCIATION, ITS CAUSES, AND SYMPTOMS (Completed)     Start:  09/25/21    End:  12/13/21      WORK WITH Amanda  "Amanda [name]" TO IDENTIFY THE MAJOR COMPONENTS OF A RECENT EPISODE OF DEPRESSION: PHYSICAL SYMPTOMS, MAJOR THOUGHTS AND IMAGES, AND MAJOR BEHAVIORS THEY EXPERIENCED (Completed)     Start:  09/25/21    End:  12/13/21         ProgressTowards Goals: Progressing  Interventions: CBT, Motivational Interviewing, and Supportive  Summary: Amanda [name] is a 41 y.o. adult who presents with anxious and tangential thought process.  Amanda [name], cooperative, maintained good eye contact.  He engaged well in therapy session was dressed casually.  Patient was alert and oriented x 5.  Patient comes in today with stressors for family conflict.  Patient reports his cousin keeps on trying to contact him about managing their grandmother's grave site.  Amanda [name] reports that he is over 800 miles away from the grave site and is  unable to maintain the grave site when his cousin is only 30 minutes away.  Other stressors for patient are for housing.  Patient reports that he is still looking for housing and has been unable to find subsidized housing in the [facility] area.  Patient reports spending most of his time utilizing coping skills such as listening to classical music, writing, and spending time with his dog.  Patient reports that he is currently working on "24 books".  Patient states today "I keep coming up with good book ideas".   LCSW psycho analytic therapy for patient to express thoughts, feelings and emotions.  LCSW supportive therapy for praise and encouragement.  LCSW used motivational interviewing for open-ended questions and positive affirmations.  Suicidal/Homicidal: Nowithout intent/plan    Plan: Return again in 4 weeks.  Diagnosis: No diagnosis found.  Collaboration of Care: Other None today   Patient/Guardian was advised Release of Information must be obtained prior to any record release in order to collaborate their care with an outside provider. Patient/Guardian was advised if they have not  already done so to contact the registration department to sign all necessary forms in order for [place] to release information regarding their care.   Consent: Patient/Guardian gives verbal consent for treatment and assignment of benefits for services provided during this visit. Patient/Guardian expressed understanding and agreed to proceed.   [name], LCSW 04/17/2023

## 2023-04-30 ENCOUNTER — Encounter: Payer: Self-pay | Admitting: *Deleted

## 2023-04-30 NOTE — Progress Notes (Signed)
Pt attended 10/27/2022 event where "his" b/p was 144/79 and blood sugar was 108. During the initial event and f/u pt was given resources for her SDOH insecurities of housing and food. During the initial and 60 day follow ups, letters also sent to pt with PCP and SDOH resources; however letter returned from 60 day f/u. During this 6 month f/u, health equity team member able to contact pt, who shared her life social, medical, and mental health experiences for approximately 30 minutes. Pt states she still wants to go to the [employer] [facility] [employer] location so pt was given the phone number to that location, which pt "read back" and confirmed the location was "right across the street from where he lives." In-basket sent to [name], [facility] counselor who pt sees on a regular basis)  again to let him know connection was made and info given to pt to call to set up at the [place] location of [facility]. No additional health equity team support scheduled at this time.

## 2023-05-13 ENCOUNTER — Ambulatory Visit (HOSPITAL_COMMUNITY): Payer: Medicare (Managed Care) | Admitting: Licensed Clinical Social Worker

## 2023-05-14 ENCOUNTER — Encounter (HOSPITAL_COMMUNITY): Payer: Medicare (Managed Care) | Admitting: Psychiatry

## 2023-05-14 ENCOUNTER — Ambulatory Visit (HOSPITAL_COMMUNITY): Payer: Medicare (Managed Care) | Admitting: Licensed Clinical Social Worker
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
## 2023-05-21 ENCOUNTER — Ambulatory Visit (INDEPENDENT_AMBULATORY_CARE_PROVIDER_SITE_OTHER): Payer: Medicare (Managed Care) | Admitting: Psychiatry

## 2023-05-21 ENCOUNTER — Encounter (HOSPITAL_COMMUNITY): Payer: Self-pay | Admitting: Psychiatry

## 2023-05-21 ENCOUNTER — Other Ambulatory Visit: Payer: Self-pay

## 2023-05-21 ENCOUNTER — Ambulatory Visit (INDEPENDENT_AMBULATORY_CARE_PROVIDER_SITE_OTHER): Payer: Medicare (Managed Care) | Admitting: Licensed Clinical Social Worker

## 2023-05-21 VITALS — BP 109/93 | HR 56 | Resp 16 | Ht 67.0 in | Wt 156.2 lb

## 2023-05-21 DIAGNOSIS — F1911 Other psychoactive substance abuse, in remission: Secondary | ICD-10-CM

## 2023-05-21 DIAGNOSIS — F1091 Alcohol use, unspecified, in remission: Secondary | ICD-10-CM | POA: Diagnosis not present

## 2023-05-21 DIAGNOSIS — F4310 Post-traumatic stress disorder, unspecified: Secondary | ICD-10-CM | POA: Diagnosis not present

## 2023-05-21 DIAGNOSIS — F603 Borderline personality disorder: Secondary | ICD-10-CM | POA: Diagnosis not present

## 2023-05-21 DIAGNOSIS — F251 Schizoaffective disorder, depressive type: Secondary | ICD-10-CM

## 2023-05-21 DIAGNOSIS — F411 Generalized anxiety disorder: Secondary | ICD-10-CM

## 2023-05-21 MED ORDER — FLUOXETINE HCL 40 MG PO CAPS
40.0000 mg | ORAL_CAPSULE | Freq: Every day | ORAL | 2 refills | Status: DC
  Filled 2023-05-21: qty 30, 30d supply, fill #0

## 2023-05-21 MED ORDER — TRAZODONE HCL 100 MG PO TABS
200.0000 mg | ORAL_TABLET | Freq: Every day | ORAL | 2 refills | Status: DC
  Filled 2023-05-21: qty 60, 30d supply, fill #0

## 2023-05-21 MED ORDER — ARIPIPRAZOLE 20 MG PO TABS
20.0000 mg | ORAL_TABLET | Freq: Every day | ORAL | 2 refills | Status: DC
  Filled 2023-05-21: qty 30, 30d supply, fill #0

## 2023-05-21 MED ORDER — HYDROXYZINE HCL 25 MG PO TABS
25.0000 mg | ORAL_TABLET | Freq: Three times a day (TID) | ORAL | 2 refills | Status: DC | PRN
  Filled 2023-05-21: qty 90, 30d supply, fill #0
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
## 2023-05-21 NOTE — Patient Instructions (Signed)
Thank you for attending your appointment today.  -- We did not make any medication changes today. Please continue medications as prescribed.  Please do not make any changes to medications without first discussing with your provider. If you are experiencing a psychiatric emergency, please call 911 or present to your nearest emergency department. Additional crisis, medication management, and therapy resources are included below.  [facility]  [address], [place], [place] [address] [phone] WALK-IN URGENT CARE 24/7 FOR ANYONE [address], [place], [place]  [phone] Fax: [phone] [email or web address] *Interpreters available *Accepts all insurance and uninsured for Urgent Care needs *Accepts Medicaid and uninsured for outpatient treatment (below)      ONLY FOR [place] Residents  Below:    Outpatient New Patient Assessment/Therapy Walk-ins:        Monday -Thursday 8am until slots are full.        Every Friday 1pm-4pm  (first come, first served)                   New Patient Psychiatry/Medication Management        Monday-Friday 8am-11am (first come, first served)               For all walk-ins we ask that you arrive by 7:15am, because patients will be seen in the order of arrival.   

## 2023-06-06 ENCOUNTER — Telehealth: Payer: Medicare (Managed Care) | Admitting: Physician Assistant

## 2023-06-06 ENCOUNTER — Encounter: Payer: Self-pay | Admitting: Physician Assistant

## 2023-06-06 DIAGNOSIS — R030 Elevated blood-pressure reading, without diagnosis of hypertension: Secondary | ICD-10-CM

## 2023-06-06 LAB — GLUCOSE, POCT (MANUAL RESULT ENTRY): Glucose Fasting, POC: 92 mg/dL (ref 70–99)

## 2023-06-06 NOTE — Progress Notes (Signed)
Pt needs PCP. Pt declined SDOH. This nurse recommended pt be seen on mobile unit on site for follow up on blood pressure.

## 2023-06-06 NOTE — Progress Notes (Signed)
Patient ID: Amanda [name], adult   DOB: [birth year], 42 y.o.   MRN: [id number] Virtual Visit via Video Note  I connected with Amanda [name] on 06/06/23 at 12:20 PM EDT by a video enabled telemedicine application and verified that I am speaking with the correct person using two identifiers.  Location: Patient: [facility] Provider: [facility]   I discussed the limitations of evaluation and management by telemedicine and the availability of in person appointments. The patient expressed understanding and agreed to proceed.  History of Present Illness: Concerns about blood pressure.  She had her BP checked at the [facility] today and it was 141/88 initially then 126/85 after 5 mins.  Pulse 56-57.  No HA/CP/dizziness.  +FH heart dz.  No early MI.  Has IUD/premenopausal.  She does smoke and is not interested in quitting.  PCP is at [facility] but she lost their phone number.  She is interested in getting her flu sot today   Observations/Objective: NAD.  [place]/AT.  Normal respirations and speech   Assessment and Plan: 1. Elevated BP without diagnosis of hypertension Check BP after sitting and resting for 5 mins and record.  Do this at least 3 times weekly and keep a record to take to your PCP.  I looked up and gave the number for PCP for patient  Flu shot given  Follow Up Instructions: prn   I discussed the assessment and treatment plan with the patient. The patient was provided an opportunity to ask questions and all were answered. The patient agreed with the plan and demonstrated an understanding of the instructions.   The patient was advised to call back or seek an in-person evaluation if the symptoms worsen or if the condition fails to improve as anticipated.  I provided 14 minutes of non-face-to-face time during this encounter.   Georgian Co, PA-C

## 2023-06-17 ENCOUNTER — Ambulatory Visit (INDEPENDENT_AMBULATORY_CARE_PROVIDER_SITE_OTHER): Payer: Medicare (Managed Care) | Admitting: Licensed Clinical Social Worker

## 2023-06-17 DIAGNOSIS — F251 Schizoaffective disorder, depressive type: Secondary | ICD-10-CM | POA: Diagnosis not present

## 2023-06-17 NOTE — Progress Notes (Signed)
THERAPIST PROGRESS NOTE  Session Time: 30   Participation Level: Active  Behavioral Response: CasualAlertAnxious and Depressed  Type of Therapy: Individual Therapy  Treatment Goals addressed:  Active     Depression     STG: Amanda "Amanda [name]" WILL COMPLETE AT LEAST 80% OF ASSIGNED HOMEWORK (Progressing)     Start:  09/25/21    Expected End:  11/15/23         STG: Reduce overall depression score by a minimum of 25% on the Patient Health Questionnaire (PHQ-9) or the Montgomery-Asberg Depression Rating Scale (MADRS) (Completed/Met)     Start:  09/25/21    Expected End:  05/18/22    Resolved:  12/13/21        Walk 3 x weekly  (Completed/Met)     Start:  10/17/21    Expected End:  10/18/22    Resolved:  08/21/22       Goal Note     Pt reports walking daily          Listening to classical or write 3 x weekly  (Completed/Met)     Start:  10/17/21    Expected End:  10/18/22    Resolved:  10/02/22       Goal Note     Pt reports listening to classical music to help decrease depression and anxiety           Identify 3 trigger for depression and anxiety  (Progressing)     Start:  10/17/21    Expected End:  11/15/23            Decrease IP psych admission to less than 1 x per year.  (Completed/Met)     Start:  10/17/21    Expected End:  04/19/23    Resolved:  02/20/23       Goal Note     Pt has had no IP admission in the last year          WORK WITH Amanda "Amanda [name]" TO TRACK SYMPTOMS, TRIGGERS AND/OR SKILL USE THROUGH A MOOD CHART, DIARY CARD, OR JOURNAL (Completed)     Start:  09/25/21    End:  12/13/21      ENCOURAGE Amanda "Amanda [name]" TO PARTICIPATE IN RECOVERY PEER SUPPORT ACTIVITIES WEEKLY (Completed)     Start:  09/25/21    End:  01/02/22      PROVIDE Amanda "Amanda [name]" WITH EDUCATIONAL INFORMATION AND READING MATERIAL ON DISSOCIATION, ITS CAUSES, AND SYMPTOMS (Completed)     Start:  09/25/21    End:  12/13/21      WORK WITH Amanda "Amanda [name]" TO IDENTIFY THE MAJOR  COMPONENTS OF A RECENT EPISODE OF DEPRESSION: PHYSICAL SYMPTOMS, MAJOR THOUGHTS AND IMAGES, AND MAJOR BEHAVIORS THEY EXPERIENCED (Completed)     Start:  09/25/21    End:  12/13/21         ProgressTowards Goals: Progressing  Interventions: CBT and Motivational Interviewing  Summary: Amanda [name] is a 42 y.o. adult who presents with anxious mood\affect.  Patient was pleasant, cooperative, maintained good eye contact.  He engaged well in therapy session was dressed casually.  Amanda [name] was alert and oriented x 5.  Patient comes in today with primary stressors of housing.  Patient reports that he has been out of housing for over 1 year now.  Patient reports that he is looking into subsidized housing but has not found any leaves yet.  Currently he is living in a tent in an undisclosed location in [place].  Patient reports that  he lives there with his dog.  Other stressors for patient are writing.  Patient reports that he continues to have the started over because he does not feel like he is gaining traction on his buttocks and then delete the entire book.  Patient endorses symptoms for frustration, tension, worry, and difficulty concentrating.  Patient reports utilizing coping skills for walking, spending time with dog, writing, and utilizing resources such as [name].   Suicidal/Homicidal: Nowithout intent/plan  Therapist Response:     Interventions/Plan: LCSW psycho analytic therapy for patient to express thoughts, feelings and emotions in session and nonjudgmental environment.  LCSW used person centered therapy for empowerment.  LCSW supportive therapy for praise and encouragement.  Plan: Return again in 4 weeks.  Diagnosis: No diagnosis found.  Collaboration of Care: Other None today   Patient/Guardian was advised Release of Information must be obtained prior to any record release in order to collaborate their care with an outside provider. Patient/Guardian was advised if they have  not already done so to contact the registration department to sign all necessary forms in order for [place] to release information regarding their care.   Consent: Patient/Guardian gives verbal consent for treatment and assignment of benefits for services provided during this visit. Patient/Guardian expressed understanding and agreed to proceed.   [name], LCSW 06/17/2023

## 2023-07-09 ENCOUNTER — Encounter: Payer: Self-pay | Admitting: *Deleted

## 2023-07-09 NOTE — Progress Notes (Signed)
Pt attended 06/06/23 screening event where her b/p was 126/85 and her blood sugar was 92, and pt declined to identify any SDOH, but listed [facility] as her address. At the event, pt was also seen by video mobile unit visit. Chart review indicates pt continues to see [facility] providers but no PCP encounters noted. However, per the the last health equity f/u notes, on 04/30/23, pt voiced the intention to see [facility] for a PCP. Health equity team member unable to reach pt by phone but did call [facility] on [address] and receptionist confirmed pt was seen there for initial "welcome visit" on 06/21/23. In basket message sent to pt's [facility] counselor, [name] to let him know pt established care on 06/21/23 but missed her initial exam visit on 07/05/23, so needs to call [facility] at [phone] to r/s that visit. Unable to contact pt by phone. Letter sent to [facility] address on record with SDOH resources and reminder to r/s at [facility] for missed appt. No additional health equity team f/u scheduled at this time.

## 2023-07-15 ENCOUNTER — Ambulatory Visit (INDEPENDENT_AMBULATORY_CARE_PROVIDER_SITE_OTHER): Payer: Medicare (Managed Care) | Admitting: Licensed Clinical Social Worker

## 2023-07-15 DIAGNOSIS — F251 Schizoaffective disorder, depressive type: Secondary | ICD-10-CM
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
## 2023-08-06 ENCOUNTER — Encounter (HOSPITAL_COMMUNITY): Payer: Self-pay | Admitting: Psychiatry

## 2023-08-06 ENCOUNTER — Other Ambulatory Visit: Payer: Self-pay

## 2023-08-06 ENCOUNTER — Ambulatory Visit (INDEPENDENT_AMBULATORY_CARE_PROVIDER_SITE_OTHER): Payer: Medicare (Managed Care) | Admitting: Psychiatry

## 2023-08-06 VITALS — BP 136/73 | HR 99 | Wt 159.4 lb

## 2023-08-06 DIAGNOSIS — F4310 Post-traumatic stress disorder, unspecified: Secondary | ICD-10-CM

## 2023-08-06 DIAGNOSIS — F411 Generalized anxiety disorder: Secondary | ICD-10-CM

## 2023-08-06 DIAGNOSIS — F1091 Alcohol use, unspecified, in remission: Secondary | ICD-10-CM

## 2023-08-06 DIAGNOSIS — F251 Schizoaffective disorder, depressive type: Secondary | ICD-10-CM

## 2023-08-06 DIAGNOSIS — F603 Borderline personality disorder: Secondary | ICD-10-CM

## 2023-08-06 DIAGNOSIS — F1911 Other psychoactive substance abuse, in remission: Secondary | ICD-10-CM

## 2023-08-06 MED ORDER — HYDROXYZINE HCL 25 MG PO TABS
25.0000 mg | ORAL_TABLET | Freq: Three times a day (TID) | ORAL | 2 refills | Status: DC | PRN
  Filled 2023-08-06: qty 90, 30d supply, fill #0

## 2023-08-06 MED ORDER — TRAZODONE HCL 100 MG PO TABS
200.0000 mg | ORAL_TABLET | Freq: Every day | ORAL | 2 refills | Status: DC
  Filled 2023-08-06: qty 60, 30d supply, fill #0

## 2023-08-06 MED ORDER — FLUOXETINE HCL 40 MG PO CAPS
40.0000 mg | ORAL_CAPSULE | Freq: Every day | ORAL | 2 refills | Status: DC
  Filled 2023-08-06: qty 30, 30d supply, fill #0

## 2023-08-06 MED ORDER — ARIPIPRAZOLE 20 MG PO TABS
20.0000 mg | ORAL_TABLET | Freq: Every day | ORAL | 2 refills | Status: DC
  Filled 2023-08-06: qty 30, 30d supply, fill #0

## 2023-08-06 NOTE — Patient Instructions (Signed)
Thank you for attending your appointment today.  -- You may try taking hydroxyzine 2 tablets at night as needed for sleep. If sleep issues persist, try taking melatonin 3-5 mg nightly which can be obtained over the counter. -- Continue other medications as prescribed.  Please do not make any changes to medications without first discussing with your provider. If you are experiencing a psychiatric emergency, please call 911 or present to your nearest emergency department. Additional crisis, medication management, and therapy resources are included below.  [facility]  [address], [place], [place] [address] [phone] WALK-IN URGENT CARE 24/7 FOR ANYONE [address] [place], [place]  [phone] Fax: [phone] [email or web address] *Interpreters available *Accepts all insurance and uninsured for Urgent Care needs *Accepts Medicaid and uninsured for outpatient treatment (below)      ONLY FOR [facility]  Below:    Outpatient New Patient Assessment/Therapy Walk-ins:        Monday, Wednesday, and Thursday 8am until slots are full (first come, first served)                   New Patient Psychiatry/Medication Management        Monday-Friday 8am-11am (first come, first served)               For all walk-ins we ask that you arrive by 7:15am, because patients will be seen in the order of arrival.

## 2023-08-12 ENCOUNTER — Ambulatory Visit (HOSPITAL_COMMUNITY): Payer: Medicare (Managed Care) | Admitting: Licensed Clinical Social Worker

## 2023-08-12 DIAGNOSIS — F251 Schizoaffective disorder, depressive type: Secondary | ICD-10-CM

## 2023-08-12 NOTE — Progress Notes (Signed)
THERAPIST PROGRESS NOTE  Virtual Visit via Video Note  I connected with Amanda [name] on 08/12/23 at  1:00 PM EST by a video enabled telemedicine application and verified that I am speaking with the correct person using two identifiers.  Location: Patient: Amanda [name]  Provider: Providers Home    I discussed the limitations of evaluation and management by telemedicine and the availability of in person appointments. The patient expressed understanding and agreed to proceed.   I discussed the assessment and treatment plan with the patient. The patient was provided an opportunity to ask questions and all were answered. The patient agreed with the plan and demonstrated an understanding of the instructions.   The patient was advised to call back or seek an in-person evaluation if the symptoms worsen or if the condition fails to improve as anticipated.  I provided 40 minutes of non-face-to-face time during this encounter.   Amanda Cooks, LCSW   Participation Level: Active  Behavioral Response: CasualAlertAnxious  Type of Therapy: Individual Therapy  Treatment Goals addressed:  Active     Depression     STG: Amanda "[name]" WILL COMPLETE AT LEAST 80% OF ASSIGNED HOMEWORK (Progressing)     Start:  09/25/21    Expected End:  11/15/23         STG: Reduce overall depression score by a minimum of 25% on the Patient Health Questionnaire (PHQ-9) or the Montgomery-Asberg Depression Rating Scale (MADRS) (Completed/Met)     Start:  09/25/21    Expected End:  05/18/22    Resolved:  12/13/21        Walk 3 x weekly  (Completed/Met)     Start:  10/17/21    Expected End:  10/18/22    Resolved:  08/21/22       Goal Note     Pt reports walking daily          Listening to classical or write 3 x weekly  (Completed/Met)     Start:  10/17/21    Expected End:  10/18/22    Resolved:  10/02/22       Goal Note     Pt reports listening to classical music to help decrease  depression and anxiety           Identify 3 trigger for depression and anxiety  (Progressing)     Start:  10/17/21    Expected End:  11/15/23            Decrease IP psych admission to less than 1 x per year.  (Completed/Met)     Start:  10/17/21    Expected End:  04/19/23    Resolved:  02/20/23       Goal Note     Pt has had no IP admission in the last year          WORK WITH Amanda "[name]" TO TRACK SYMPTOMS, TRIGGERS AND/OR SKILL USE THROUGH A MOOD CHART, DIARY CARD, OR JOURNAL (Completed)     Start:  09/25/21    End:  12/13/21      ENCOURAGE Amanda "[name]" TO PARTICIPATE IN RECOVERY PEER SUPPORT ACTIVITIES WEEKLY (Completed)     Start:  09/25/21    End:  01/02/22      PROVIDE Amanda "[name]" WITH EDUCATIONAL INFORMATION AND READING MATERIAL ON DISSOCIATION, ITS CAUSES, AND SYMPTOMS (Completed)     Start:  09/25/21    End:  12/13/21      WORK WITH Amanda "[name]" TO IDENTIFY THE  MAJOR COMPONENTS OF A RECENT EPISODE OF DEPRESSION: PHYSICAL SYMPTOMS, MAJOR THOUGHTS AND IMAGES, AND MAJOR BEHAVIORS THEY EXPERIENCED (Completed)     Start:  09/25/21    End:  12/13/21         ProgressTowards Goals: Progressing  Interventions: CBT and Supportive   Suicidal/Homicidal: Nowithout intent/plan  Therapist Response:   Patient was alert and oriented x 5.  He was pleasant, cooperative, maintained good eye contact.  He engaged well in therapy session was dressed casually.  He presented today with euthymic mood\affect.  On comes in today with primary stressors as family conflict.  He reports that this is one of his primary triggers to his depression and anxiety.  He reports that his family has always struggled with his life choices.  Patient reports that he is contemplating moving back to [facility].  He reports that there are some pros which he would have housing and a better support system out there.  Patient reports promises that he would be closer to his family and overall  they are not as excepting him for the LGBTQ community.  [name] denies any suicidal ideations and homicidal ideations.  He denies any auditory or visual hallucinations.  He reports primary coping skills as writing, spending time with dog, and listening to classical music.  Interventions/plan: LCSW utilized psycho analytic therapy for patient to express thoughts, feelings and emotions in session and nonjudgmental environment.  LCSW used unconditional positive regard and empowerment for person centered therapy.  LCSW strength-based therapy to discuss his strengths such as coping skills and advocating for himself.  LCSW and patient spoke about triggers today for anxiety and depression for family.  Plan: Return again in 4 weeks.  Diagnosis: Schizoaffective disorder, depressive type (HCC)  Collaboration of Care: Other None today   Patient/Guardian was advised Release of Information must be obtained prior to any record release in order to collaborate their care with an outside provider. Patient/Guardian was advised if they have not already done so to contact the registration department to sign all necessary forms in order for [place] to release information regarding their care.   Consent: Patient/Guardian gives verbal consent for treatment and assignment of benefits for services provided during this visit. Patient/Guardian expressed understanding and agreed to proceed.   Amanda Cooks, LCSW 08/12/2023

## 2023-08-15 ENCOUNTER — Other Ambulatory Visit: Payer: Self-pay

## 2023-09-10 ENCOUNTER — Ambulatory Visit (HOSPITAL_COMMUNITY): Payer: Medicare (Managed Care) | Admitting: Licensed Clinical Social Worker

## 2023-09-10 DIAGNOSIS — F251 Schizoaffective disorder, depressive type: Secondary | ICD-10-CM

## 2023-09-10 NOTE — Progress Notes (Signed)
THERAPIST PROGRESS NOTE  Virtual Visit via Video Note  I connected with Amanda [name] on 09/10/23 at  9:00 AM EST by a video enabled telemedicine application and verified that I am speaking with the correct person using two identifiers.  Location: Patient:[place]  Provider: [facility]   I discussed the limitations of evaluation and management by telemedicine and the availability of in person appointments. The patient expressed understanding and agreed to proceed.     I discussed the assessment and treatment plan with the patient. The patient was provided an opportunity to ask questions and all were answered. The patient agreed with the plan and demonstrated an understanding of the instructions.   The patient was advised to call back or seek an in-person evaluation if the symptoms worsen or if the condition fails to improve as anticipated.  I provided 30 minutes of non-face-to-face time during this encounter.   Amanda Cooks, LCSW   Participation Level: Active  Behavioral Response: CasualAlertAnxious and Depressed  Type of Therapy: Individual Therapy  Treatment Goals addressed:  Active     Depression     STG: Amanda "[name]" WILL COMPLETE AT LEAST 80% OF ASSIGNED HOMEWORK (Progressing)     Start:  09/25/21    Expected End:  11/15/23         STG: Reduce overall depression score by a minimum of 25% on the Patient Health Questionnaire (PHQ-9) or the Montgomery-Asberg Depression Rating Scale (MADRS) (Completed/Met)     Start:  09/25/21    Expected End:  05/18/22    Resolved:  12/13/21        Walk 3 x weekly  (Completed/Met)     Start:  10/17/21    Expected End:  10/18/22    Resolved:  08/21/22       Goal Note     Pt reports walking daily          Listening to classical or write 3 x weekly  (Completed/Met)     Start:  10/17/21    Expected End:  10/18/22    Resolved:  10/02/22       Goal Note     Pt reports listening  to classical music to help decrease depression and anxiety           Identify 3 trigger for depression and anxiety  (Progressing)     Start:  10/17/21    Expected End:  11/15/23            Decrease IP psych admission to less than 1 x per year.  (Completed/Met)     Start:  10/17/21    Expected End:  04/19/23    Resolved:  02/20/23       Goal Note     Pt has had no IP admission in the last year          WORK WITH Aizlyn "[name]" TO TRACK SYMPTOMS, TRIGGERS AND/OR SKILL USE THROUGH A MOOD CHART, DIARY CARD, OR JOURNAL (Completed)     Start:  09/25/21    End:  12/13/21      ENCOURAGE Amanda "[name]" TO PARTICIPATE IN RECOVERY PEER SUPPORT ACTIVITIES WEEKLY (Completed)     Start:  09/25/21    End:  01/02/22      PROVIDE Amanda "[name]" WITH EDUCATIONAL INFORMATION AND READING MATERIAL ON DISSOCIATION, ITS CAUSES, AND SYMPTOMS (Completed)     Start:  09/25/21    End:  12/13/21      WORK WITH  Amanda "[name]" TO IDENTIFY THE MAJOR COMPONENTS OF A RECENT EPISODE OF DEPRESSION: PHYSICAL SYMPTOMS, MAJOR THOUGHTS AND IMAGES, AND MAJOR BEHAVIORS THEY EXPERIENCED (Completed)     Start:  09/25/21    End:  12/13/21         ProgressTowards Goals: Progressing  Interventions: CBT, Motivational Interviewing, and Supportive  Summary: Amanda [name] is a 43 y.o. adult who presents with anxious mood\affect.  Patient was pleasant, cooperative, maintained good eye contact.  He engaged well in therapy session was dressed casually.  Primary stressor for patient is illness and family conflict.  Patient reports that he has the flu.  He reports that he did not go and get it tested at the doctor but this happens yearly and he knows his flu symptoms.  Patient reports this has created family conflict with his father.  He reports that his father thinks that if he got the flu shot then Amanda [name] would not have gotten the flu.  Patient is also discussing moving back to [place] where he is  from.  Suicidal/Homicidal: Nowithout intent/plan  Therapist Response:     Interventions/Plan: LCSW utilized psycho analytic therapy for patient to express thoughts, feelings and emotions.  LCSW utilized supportive therapy for praise and encouragement.  LCSW use person centered therapy for empowerment.  Plan: Return again in 4 weeks.  Diagnosis: Schizoaffective disorder, depressive type (HCC)  Collaboration of Care: Other None today   Patient/Guardian was advised Release of Information must be obtained prior to any record release in order to collaborate their care with an outside provider. Patient/Guardian was advised if they have not already done so to contact the registration department to sign all necessary forms in order for [place] to release information regarding their care.   Consent: Patient/Guardian gives verbal consent for treatment and assignment of benefits for services provided during this visit. Patient/Guardian expressed understanding and agreed to proceed.   Amanda Cooks, LCSW 09/10/2023

## 2023-10-08 ENCOUNTER — Ambulatory Visit (INDEPENDENT_AMBULATORY_CARE_PROVIDER_SITE_OTHER): Payer: Medicare (Managed Care) | Admitting: Licensed Clinical Social Worker

## 2023-10-08 DIAGNOSIS — F251 Schizoaffective disorder, depressive type: Secondary | ICD-10-CM

## 2023-10-08 NOTE — Progress Notes (Signed)
THERAPIST PROGRESS NOTE  Virtual Visit via Video Note  I connected with [name] on 10/08/23 at 10:00 AM EST by a video enabled telemedicine application and verified that I am speaking with the correct person using two identifiers.  Location: Patient: Amanda [name]  Provider: Providers Home    I discussed the limitations of evaluation and management by telemedicine and the availability of in person appointments. The patient expressed understanding and agreed to proceed.    I discussed the assessment and treatment plan with the patient. The patient was provided an opportunity to ask questions and all were answered. The patient agreed with the plan and demonstrated an understanding of the instructions.   The patient was advised to call back or seek an in-person evaluation if the symptoms worsen or if the condition fails to improve as anticipated.  I provided 45  minutes of non-face-to-face time during this encounter.   [name], LCSW   Participation Level: Active  Behavioral Response: CasualAlertAnxious and Depressed  Type of Therapy: Individual Therapy  Treatment Goals addressed:  Active     Depression     STG: Alyssha "[name]" WILL COMPLETE AT LEAST 80% OF ASSIGNED HOMEWORK (Progressing)     Start:  09/25/21    Expected End:  11/15/23         STG: Reduce overall depression score by a minimum of 25% on the Patient Health Questionnaire (PHQ-9) or the Montgomery-Asberg Depression Rating Scale (MADRS) (Completed/Met)     Start:  09/25/21    Expected End:  05/18/22    Resolved:  12/13/21        Walk 3 x weekly  (Completed/Met)     Start:  10/17/21    Expected End:  10/18/22    Resolved:  08/21/22       Goal Note     Pt reports walking daily          Listening to classical or write 3 x weekly  (Completed/Met)     Start:  10/17/21    Expected End:  10/18/22    Resolved:  10/02/22       Goal Note     Pt reports listening to classical music to  help decrease depression and anxiety           Identify 3 trigger for depression and anxiety  (Progressing)     Start:  10/17/21    Expected End:  11/15/23            Decrease IP psych admission to less than 1 x per year.  (Completed/Met)     Start:  10/17/21    Expected End:  04/19/23    Resolved:  02/20/23       Goal Note     Pt has had no IP admission in the last year          WORK WITH Geraldyne "[name]" TO TRACK SYMPTOMS, TRIGGERS AND/OR SKILL USE THROUGH A MOOD CHART, DIARY CARD, OR JOURNAL (Completed)     Start:  09/25/21    End:  12/13/21      ENCOURAGE Skya "[name]" TO PARTICIPATE IN RECOVERY PEER SUPPORT ACTIVITIES WEEKLY (Completed)     Start:  09/25/21    End:  01/02/22      PROVIDE Makinsey "[name]" WITH EDUCATIONAL INFORMATION AND READING MATERIAL ON DISSOCIATION, ITS CAUSES, AND SYMPTOMS (Completed)     Start:  09/25/21    End:  12/13/21      WORK WITH Naome "[name]"  TO IDENTIFY THE MAJOR COMPONENTS OF A RECENT EPISODE OF DEPRESSION: PHYSICAL SYMPTOMS, MAJOR THOUGHTS AND IMAGES, AND MAJOR BEHAVIORS THEY EXPERIENCED (Completed)     Start:  09/25/21    End:  12/13/21         ProgressTowards Goals: Progressing  Interventions: CBT, Motivational Interviewing, and Supportive  Summary: Marymargaret Kirker is a 43 y.o. adult who presents with anxious mood\affect.  Patient was pleasant, cooperative, maintained good eye contact.  He engaged well in therapy session was dressed casually.  [name] was alert and oriented x 5.  Patient reports primary stressors today as housing.  He reports that he has found housing and could be signing a lease tomorrow.  [place].  He reports that he will be meeting with the landlord tomorrow to discuss specifics.  Patient has been without housing for over 6 months and he is eager to find a place to live.  Patient reports frustration since losing his housing and being evicted 6 months ago and reports that it  would be nice not to be living in his tent anymore.  Patient reports primary support system is [facility] which is an LGBTQ support facility patient reports that they get close and have support groups.  Patient reports overall frustration as her father has continued to have discussions with her about the current presidential administration and his support for it and [name] does not have any support for the current presidential regime.  Suicidal/Homicidal: Nowithout intent/plan  Therapist Response:   Intervention/plan: LCSW utilized supportive therapy for praise and encouragement.  LCSW utilized psycho analytic therapy for patient to express thoughts, feelings and concerns in session.  LCSW utilized person centered therapy for empowerment.  LCSW educated patient on utilizing support systems in the community as well as personal relationships.  LCSW and patient spoke today about triggers for depression and anxiety such as housing, current political climate, and family conflict.    Plan: Return again in 4 weeks.  Diagnosis: Schizoaffective disorder, depressive type (HCC)  Collaboration of Care: Other None today   Patient/Guardian was advised Release of Information must be obtained prior to any record release in order to collaborate their care with an outside provider. Patient/Guardian was advised if they have not already done so to contact the registration department to sign all necessary forms in order for [place] to release information regarding their care.   Consent: Patient/Guardian gives verbal consent for treatment and assignment of benefits for services provided during this visit. Patient/Guardian expressed understanding and agreed to proceed.   [name], LCSW 10/08/2023
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
## 2023-10-29 ENCOUNTER — Encounter (HOSPITAL_COMMUNITY): Payer: Self-pay | Admitting: Psychiatry

## 2023-10-29 ENCOUNTER — Other Ambulatory Visit: Payer: Self-pay

## 2023-10-29 ENCOUNTER — Ambulatory Visit (INDEPENDENT_AMBULATORY_CARE_PROVIDER_SITE_OTHER): Payer: Medicare (Managed Care) | Admitting: Psychiatry

## 2023-10-29 VITALS — BP 127/71 | HR 70 | Ht 67.0 in | Wt 160.0 lb

## 2023-10-29 DIAGNOSIS — F4310 Post-traumatic stress disorder, unspecified: Secondary | ICD-10-CM

## 2023-10-29 DIAGNOSIS — F251 Schizoaffective disorder, depressive type: Secondary | ICD-10-CM

## 2023-10-29 DIAGNOSIS — F411 Generalized anxiety disorder: Secondary | ICD-10-CM

## 2023-10-29 DIAGNOSIS — F603 Borderline personality disorder: Secondary | ICD-10-CM

## 2023-10-29 DIAGNOSIS — F1091 Alcohol use, unspecified, in remission: Secondary | ICD-10-CM

## 2023-10-29 MED ORDER — TRAZODONE HCL 100 MG PO TABS
200.0000 mg | ORAL_TABLET | Freq: Every day | ORAL | 2 refills | Status: DC
  Filled 2023-10-29: qty 60, 30d supply, fill #0

## 2023-10-29 MED ORDER — HYDROXYZINE HCL 25 MG PO TABS
25.0000 mg | ORAL_TABLET | Freq: Three times a day (TID) | ORAL | 2 refills | Status: DC | PRN
  Filled 2023-10-29: qty 90, 30d supply, fill #0

## 2023-10-29 MED ORDER — ARIPIPRAZOLE 20 MG PO TABS
20.0000 mg | ORAL_TABLET | Freq: Every day | ORAL | 2 refills | Status: DC
  Filled 2023-10-29: qty 30, 30d supply, fill #0

## 2023-10-29 MED ORDER — FLUOXETINE HCL 40 MG PO CAPS
40.0000 mg | ORAL_CAPSULE | Freq: Every day | ORAL | 2 refills | Status: DC
  Filled 2023-10-29: qty 30, 30d supply, fill #0

## 2023-10-29 NOTE — Patient Instructions (Signed)

## 2023-11-05 ENCOUNTER — Ambulatory Visit (INDEPENDENT_AMBULATORY_CARE_PROVIDER_SITE_OTHER): Payer: Medicare (Managed Care) | Admitting: Licensed Clinical Social Worker

## 2023-11-05 ENCOUNTER — Encounter (HOSPITAL_COMMUNITY): Payer: Self-pay | Admitting: Licensed Clinical Social Worker

## 2023-11-05 DIAGNOSIS — F251 Schizoaffective disorder, depressive type: Secondary | ICD-10-CM

## 2023-11-05 NOTE — Progress Notes (Signed)
 THERAPIST PROGRESS NOTE  Virtual Visit via Video Note  I connected with Amanda [name] on 11/05/23 at 10:00 AM EDT by a video enabled telemedicine application and verified that I am speaking with the correct person using two identifiers.  Location: Patient: [facility]  Provider: Providers Home    I discussed the limitations of evaluation and management by telemedicine and the availability of in person appointments. The patient expressed understanding and agreed to proceed.  I discussed the assessment and treatment plan with the patient. The patient was provided an opportunity to ask questions and all were answered. The patient agreed with the plan and demonstrated an understanding of the instructions.   The patient was advised to call back or seek an in-person evaluation if the symptoms worsen or if the condition fails to improve as anticipated.  I provided 30  minutes of non-face-to-face time during this encounter.   Amanda Cooks, LCSW   Participation Level: Active  Behavioral Response: CasualAlertAnxious and Depressed  Type of Therapy: Individual Therapy  Treatment Goals addressed:  Active     Depression     STG: Amanda "[name]" WILL COMPLETE AT LEAST 80% OF ASSIGNED HOMEWORK (Progressing)     Start:  09/25/21    Expected End:  11/15/23         STG: Reduce overall depression score by a minimum of 25% on the Patient Health Questionnaire (PHQ-9) or the Montgomery-Asberg Depression Rating Scale (MADRS) (Completed/Met)     Start:  09/25/21    Expected End:  05/18/22    Resolved:  12/13/21        Walk 3 x weekly  (Completed/Met)     Start:  10/17/21    Expected End:  10/18/22    Resolved:  08/21/22       Goal Note     Pt reports walking daily          Listening to classical or write 3 x weekly  (Completed/Met)     Start:  10/17/21    Expected End:  10/18/22    Resolved:  10/02/22       Goal Note     Pt reports listening to classical music to help  decrease depression and anxiety           Identify 3 trigger for depression and anxiety  (Progressing)     Start:  10/17/21    Expected End:  11/15/23          Goal Note     Family           Decrease IP psych admission to less than 1 x per year.  (Completed/Met)     Start:  10/17/21    Expected End:  04/19/23    Resolved:  02/20/23       Goal Note     Pt has had no IP admission in the last year          WORK WITH Amanda "[name]" TO TRACK SYMPTOMS, TRIGGERS AND/OR SKILL USE THROUGH A MOOD CHART, DIARY CARD, OR JOURNAL (Completed)     Start:  09/25/21    End:  12/13/21      ENCOURAGE Amanda "[name]" TO PARTICIPATE IN RECOVERY PEER SUPPORT ACTIVITIES WEEKLY (Completed)     Start:  09/25/21    End:  01/02/22      PROVIDE Amanda "[name]" WITH EDUCATIONAL INFORMATION AND READING MATERIAL ON DISSOCIATION, ITS CAUSES, AND SYMPTOMS (Completed)     Start:  09/25/21  End:  12/13/21      WORK WITH Amanda "[name]" TO IDENTIFY THE MAJOR COMPONENTS OF A RECENT EPISODE OF DEPRESSION: PHYSICAL SYMPTOMS, MAJOR THOUGHTS AND IMAGES, AND MAJOR BEHAVIORS THEY EXPERIENCED (Completed)     Start:  09/25/21    End:  12/13/21         ProgressTowards Goals: Progressing  Interventions: CBT, Motivational Interviewing, and Supportive  Summary: Amanda [name] is a 43 y.o. adult who presents with euthymic mood\affect.  Patient was pleasant, cooperative, maintained good eye contact.  [name] was alert and oriented x 5.  Patient comes in today stating "I found housing".  He reports that he has moved in with a friend who he has known for multiple years.  He reports that they recently moved into an apartment with 2 bedrooms and asked if they wanted to split the rent.  [name] was agreeable and they have now moved in to their new housing.  Patient reports relief overall finding housing and not having to live in a tent.  He reports that he is still utilizing support groups at [name] for transgender  support group.  Patient also reports utilizing coping skills such as riding to help decrease his anxiety and depression.  Suicidal/Homicidal: Nowithout intent/plan  Therapist Response:     Intervention/Plan: LCSW administrative Amanda [name] suicide severity rating scale.  Patient scored at low risk.  LCSW safety plan with patient for behavioral health urgent care and [address] and 9 a date suicide prevention hotline.  LCSW administrator GAD-7 and PHQ-9.  LCSW reviewed those scores with patient as patient scored a 0 on both.  LCSW spoke with patient about triggers for anxiety and depression such as family conflict with her father.  LCSW utilized supportive therapy for praise and encouragement and the utilization of coping skills such as reading, writing, and listening to music.  Plan: Return again in 1 weeks.  Diagnosis: Schizoaffective disorder, depressive type (HCC)  Collaboration of Care: Other none today   Patient/Guardian was advised Release of Information must be obtained prior to any record release in order to collaborate their care with an outside provider. Patient/Guardian was advised if they have not already done so to contact the registration department to sign all necessary forms in order for [place] to release information regarding their care.   Consent: Patient/Guardian gives verbal consent for treatment and assignment of benefits for services provided during this visit. Patient/Guardian expressed understanding and agreed to proceed.   Amanda Cooks, LCSW 11/05/2023

## 2023-11-07 ENCOUNTER — Other Ambulatory Visit: Payer: Self-pay

## 2023-11-13 ENCOUNTER — Ambulatory Visit (HOSPITAL_COMMUNITY): Payer: Medicare (Managed Care) | Admitting: Licensed Clinical Social Worker

## 2023-11-13 DIAGNOSIS — F251 Schizoaffective disorder, depressive type: Secondary | ICD-10-CM | POA: Diagnosis not present

## 2023-11-13 NOTE — Progress Notes (Signed)
 THERAPIST PROGRESS NOTE  Virtual Visit via Video Note  I connected with [name] on 11/13/23 at  3:00 PM EDT by a video enabled telemedicine application and verified that I am speaking with the correct person using two identifiers.  Location: Patient: [facility]  Provider: Providers Home    I discussed the limitations of evaluation and management by telemedicine and the availability of in person appointments. The patient expressed understanding and agreed to proceed.     I discussed the assessment and treatment plan with the patient. The patient was provided an opportunity to ask questions and all were answered. The patient agreed with the plan and demonstrated an understanding of the instructions.   The patient was advised to call back or seek an in-person evaluation if the symptoms worsen or if the condition fails to improve as anticipated.  I provided 30 minutes of non-face-to-face time during this encounter.   [name], LCSW  Participation Level: Active  Behavioral Response: CasualAlertEuthymic  Type of Therapy: Individual Therapy  Treatment Goals addressed: Active     Depression     STG: Jacci "[name]" WILL COMPLETE AT LEAST 80% OF ASSIGNED HOMEWORK (Progressing)     Start:  09/25/21    Expected End:  11/15/23         STG: Reduce overall depression score by a minimum of 25% on the Patient Health Questionnaire (PHQ-9) or the Montgomery-Asberg Depression Rating Scale (MADRS) (Completed/Met)     Start:  09/25/21    Expected End:  05/18/22    Resolved:  12/13/21        Walk 3 x weekly  (Completed/Met)     Start:  10/17/21    Expected End:  10/18/22    Resolved:  08/21/22       Goal Note     Pt reports walking daily          Listening to classical or write 3 x weekly  (Completed/Met)     Start:  10/17/21    Expected End:  10/18/22    Resolved:  10/02/22       Goal Note     Pt reports listening to classical music to help decrease  depression and anxiety           Identify 3 trigger for depression and anxiety  (Progressing)     Start:  10/17/21    Expected End:  11/15/23            Decrease IP psych admission to less than 1 x per year.  (Completed/Met)     Start:  10/17/21    Expected End:  04/19/23    Resolved:  02/20/23       Goal Note     Pt has had no IP admission in the last year          WORK WITH Jissell "[name]" TO TRACK SYMPTOMS, TRIGGERS AND/OR SKILL USE THROUGH A MOOD CHART, DIARY CARD, OR JOURNAL (Completed)     Start:  09/25/21    End:  12/13/21      ENCOURAGE Yasenia "[name]" TO PARTICIPATE IN RECOVERY PEER SUPPORT ACTIVITIES WEEKLY (Completed)     Start:  09/25/21    End:  01/02/22      PROVIDE Ladan "[name]" WITH EDUCATIONAL INFORMATION AND READING MATERIAL ON DISSOCIATION, ITS CAUSES, AND SYMPTOMS (Completed)     Start:  09/25/21    End:  12/13/21      WORK WITH Julissa "[name]" TO IDENTIFY THE  MAJOR COMPONENTS OF A RECENT EPISODE OF DEPRESSION: PHYSICAL SYMPTOMS, MAJOR THOUGHTS AND IMAGES, AND MAJOR BEHAVIORS THEY EXPERIENCED (Completed)     Start:  09/25/21    End:  12/13/21          ProgressTowards Goals: Progressing  Interventions: Supportive   Suicidal/Homicidal: Nowithout intent/plan  Therapist Response:   [name] was alert and oriented x 4.  He was pleasant, cooperative, maintained good eye contact.  He engaged well in therapy session and was dressed casually.  He presented today with euthymic mood\affect.  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  He reports that he has trouble focusing at times and tends to start over the entire process.  This comes from tension and worry that it is not good enough or that he needs a new direction for the bulk overall.  Patient reports that he has a tendency to just delete it instead of just saving things as rough drafts.  He reports frustrations as if he gets peer pressure from his friends to finish and publish his book.  Patient  reports that this takes time and he wants to get a ride for a publisher to read.  He reports today that even the wrong spot can get a denial back from the publishing company.  [name] reports frustration due to the fact that he knows he needs a Oceanographer in order to get his book out there to people but also realizes that he cannot publish on his own as that is "too expensive".  LCSW utilized psychoanalytic therapy for patient to express thoughts, feelings and concerns and nonjudgmental environment.  LCSW utilized supportive therapy for praise and encouragement.  LCSW utilize person Center therapy for empowerment.  LCSW utilized open-ended questions and reflective listening for motivational interviewing.   Plan: Return again in 4 weeks.  Diagnosis: Schizoaffective disorder, depressive type (HCC)  Collaboration of Care: Other none today   Patient/Guardian was advised Release of Information must be obtained prior to any record release in order to collaborate their care with an outside provider. Patient/Guardian was advised if they have not already done so to contact the registration department to sign all necessary forms in order for [place] to release information regarding their care.   Consent: Patient/Guardian gives verbal consent for treatment and assignment of benefits for services provided during this visit. Patient/Guardian expressed understanding and agreed to proceed.   [name], LCSW 11/13/2023

## 2023-12-17 ENCOUNTER — Ambulatory Visit (INDEPENDENT_AMBULATORY_CARE_PROVIDER_SITE_OTHER): Payer: Medicare (Managed Care) | Admitting: Licensed Clinical Social Worker

## 2023-12-17 DIAGNOSIS — F251 Schizoaffective disorder, depressive type: Secondary | ICD-10-CM

## 2023-12-17 NOTE — Progress Notes (Signed)
 THERAPIST PROGRESS NOTE  Session Time: 30   Participation Level: Active  Behavioral Response: CasualAlertAnxious and Depressed  Type of Therapy: Individual Therapy  Treatment Goals addressed:  Active     Depression     STG: Amanda "Amanda [name]" WILL COMPLETE AT LEAST 80% OF ASSIGNED HOMEWORK (Progressing)     Start:  09/25/21    Expected End:  04/17/24         STG: Reduce overall depression score by a minimum of 25% on the Patient Health Questionnaire (PHQ-9) or the Montgomery-Asberg Depression Rating Scale (MADRS) (Completed/Met)     Start:  09/25/21    Expected End:  05/18/22    Resolved:  12/13/21        Walk 3 x weekly  (Completed/Met)     Start:  10/17/21    Expected End:  10/18/22    Resolved:  08/21/22       Goal Note     Pt reports walking daily          Listening to classical or write 3 x weekly  (Completed/Met)     Start:  10/17/21    Expected End:  10/18/22    Resolved:  10/02/22       Goal Note     Pt reports listening to classical music to help decrease depression and anxiety           Identify 3 trigger for depression and anxiety  (Progressing)     Start:  10/17/21    Expected End:  04/17/24            Decrease IP psych admission to less than 1 x per year.  (Completed/Met)     Start:  10/17/21    Expected End:  04/19/23    Resolved:  02/20/23       Goal Note     Pt has had no IP admission in the last year          WORK WITH Amanda "Amanda [name]" TO TRACK SYMPTOMS, TRIGGERS AND/OR SKILL USE THROUGH A MOOD CHART, DIARY CARD, OR JOURNAL (Completed)     Start:  09/25/21    End:  12/13/21      ENCOURAGE Amanda "Amanda [name]" TO PARTICIPATE IN RECOVERY PEER SUPPORT ACTIVITIES WEEKLY (Completed)     Start:  09/25/21    End:  01/02/22      PROVIDE Amanda "Amanda [name]" WITH EDUCATIONAL INFORMATION AND READING MATERIAL ON DISSOCIATION, ITS CAUSES, AND SYMPTOMS (Completed)     Start:  09/25/21    End:  12/13/21      WORK WITH Amanda "Amanda [name]" TO IDENTIFY THE MAJOR  COMPONENTS OF A RECENT EPISODE OF DEPRESSION: PHYSICAL SYMPTOMS, MAJOR THOUGHTS AND IMAGES, AND MAJOR BEHAVIORS THEY EXPERIENCED (Completed)     Start:  09/25/21    End:  12/13/21         ProgressTowards Goals: Progressing  Interventions: CBT, Motivational Interviewing, Supportive, and Reframing  Suicidal/Homicidal: Nowithout intent/plan  Therapist Response:    Amanda [name] was alert and oriented x 5.  He was pleasant, cooperative, maintained good eye contact.  He engaged well in therapy session was dressed casually.  He presented today with euthymic and anxious mood\affect.  Patient reports today primary stressors as family conflict.  He reports that he sets poor boundaries with people in his life such as his father and his cousin.  Amanda [name] reports that his cousin wants him to move back to [facility] [place] .  Patient reports there is nothing good for him in [place]  other  than visiting his 1 friend and grandmother's grave.  Patient reports that he feels like his cousin only wants him to come there because he may be getting close to being published with his book.  Patient reports multiple reasons why it is not a good idea for him to move back but still allows his cousin to try to convince him.    LCSW and patient spoke today about setting healthy boundaries such as possibly blocking his cousin.  Patient reports that he needs Facebook in order to engage with his father online as he does not want his father to have his cell phone number.  Patient and LCSW spoke of a plan to log in and out of Facebook as needed to talk with his dad and limit communication with his cousin.  Patient was agreeable to plan.  Intervention/plan: LCSW utilized supportive therapy for praise and encouragement.  LCSW spoke with patient about taking medications as prescribed and consistently for best outcomes.  LCSW educated patient on setting healthy boundaries as listed above.  LCSW educated patient on advocating for his  self over others to best handle mental health needs.   Plan: Return again in 4 weeks.  Diagnosis: Schizoaffective disorder, depressive type (HCC)  Collaboration of Care: Other None today   Patient/Guardian was advised Release of Information must be obtained prior to any record release in order to collaborate their care with an outside provider. Patient/Guardian was advised if they have not already done so to contact the registration department to sign all necessary forms in order for us  to release information regarding their care.   Consent: Patient/Guardian gives verbal consent for treatment and assignment of benefits for services provided during this visit. Patient/Guardian expressed understanding and agreed to proceed.   Maryagnes Small, LCSW 12/17/2023

## 2024-01-15 ENCOUNTER — Ambulatory Visit (INDEPENDENT_AMBULATORY_CARE_PROVIDER_SITE_OTHER): Payer: Medicare (Managed Care) | Admitting: Licensed Clinical Social Worker

## 2024-01-15 DIAGNOSIS — F251 Schizoaffective disorder, depressive type: Secondary | ICD-10-CM | POA: Diagnosis not present

## 2024-01-15 NOTE — Progress Notes (Unsigned)
 THERAPIST PROGRESS NOTE  Session Time: 30   Participation Level: Active  Behavioral Response: CasualAlertEuthymic  Type of Therapy: Individual Therapy  Treatment Goals addressed:  Active     Depression     STG: Amanda "Amanda [name]" WILL COMPLETE AT LEAST 80% OF ASSIGNED HOMEWORK (Progressing)     Start:  09/25/21    Expected End:  04/17/24         STG: Reduce overall depression score by a minimum of 25% on the Patient Health Questionnaire (PHQ-9) or the Montgomery-Asberg Depression Rating Scale (MADRS) (Completed/Met)     Start:  09/25/21    Expected End:  05/18/22    Resolved:  12/13/21        Walk 3 x weekly  (Completed/Met)     Start:  10/17/21    Expected End:  10/18/22    Resolved:  08/21/22       Goal Note     Pt reports walking daily          Listening to classical or write 3 x weekly  (Completed/Met)     Start:  10/17/21    Expected End:  10/18/22    Resolved:  10/02/22       Goal Note     Pt reports listening to classical music to help decrease depression and anxiety           Identify 3 trigger for depression and anxiety  (Progressing)     Start:  10/17/21    Expected End:  04/17/24            Decrease IP psych admission to less than 1 x per year.  (Completed/Met)     Start:  10/17/21    Expected End:  04/19/23    Resolved:  02/20/23       Goal Note     Pt has had no IP admission in the last year          WORK WITH Amanda "Amanda [name]" TO TRACK SYMPTOMS, TRIGGERS AND/OR SKILL USE THROUGH A MOOD CHART, DIARY CARD, OR JOURNAL (Completed)     Start:  09/25/21    End:  12/13/21      ENCOURAGE Amanda "Amanda [name]" TO PARTICIPATE IN RECOVERY PEER SUPPORT ACTIVITIES WEEKLY (Completed)     Start:  09/25/21    End:  01/02/22      PROVIDE Amanda "Amanda [name]" WITH EDUCATIONAL INFORMATION AND READING MATERIAL ON DISSOCIATION, ITS CAUSES, AND SYMPTOMS (Completed)     Start:  09/25/21    End:  12/13/21      WORK WITH Amanda "Amanda [name]" TO IDENTIFY THE MAJOR COMPONENTS OF  A RECENT EPISODE OF DEPRESSION: PHYSICAL SYMPTOMS, MAJOR THOUGHTS AND IMAGES, AND MAJOR BEHAVIORS THEY EXPERIENCED (Completed)     Start:  09/25/21    End:  12/13/21         ProgressTowards Goals: Progressing  Interventions: CBT, Supportive, and Reframing   Suicidal/Homicidal: Nowithout intent/plan  Therapist Response:      Amanda [name] was alert and oriented x 5.  He was pleasant, cooperative, maintained good eye contact.  Engaged well in therapy session was dressed casually.  Amanda [name] presented with euthymic mood\affect.  Patient reports today "I am very tired".  He reports that this is due to the weather and it causing his arthritis stacked up from the rain.  Patient reports that due to the pain and his medications lacking effectiveness at night it has caused him not to get the best sleep.  Amanda [name] and LCSW brainstorm how to  advocate for himself through medication management team to discuss medication changes.  Patient reports goals of wanting to walk more such as walking his dog and going to the park.  He would also like to limit his social media use moving forward.  Amanda [name] reports he is "getting bored up Facebook".  Patient reports that he is currently living in a house with a friend.  Amanda [name] states that they are not romantic and things have been going well between them as roommates.  Patient reports that they go to [employer] 1-2 times per week to engage socially and check out resources that it can provide.  Amanda [name] denies any suicidal ideations or visual hallucinations  Plan: Return again in 4 weeks.  Diagnosis: Schizoaffective disorder, depressive type (HCC)  Collaboration of Care: Other None today   Patient/Guardian was advised Release of Information must be obtained prior to any record release in order to collaborate their care with an outside provider. Patient/Guardian was advised if they have not already done so to contact the registration department to sign all necessary forms in order for  us  to release information regarding their care.   Consent: Patient/Guardian gives verbal consent for treatment and assignment of benefits for services provided during this visit. Patient/Guardian expressed understanding and agreed to proceed.   Maryagnes Small, LCSW 01/15/2024
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
## 2024-01-28 ENCOUNTER — Encounter (HOSPITAL_COMMUNITY): Payer: Self-pay | Admitting: Psychiatry

## 2024-01-28 ENCOUNTER — Other Ambulatory Visit: Payer: Self-pay

## 2024-01-28 ENCOUNTER — Ambulatory Visit (INDEPENDENT_AMBULATORY_CARE_PROVIDER_SITE_OTHER): Payer: Medicare (Managed Care) | Admitting: Psychiatry

## 2024-01-28 DIAGNOSIS — F411 Generalized anxiety disorder: Secondary | ICD-10-CM

## 2024-01-28 DIAGNOSIS — F251 Schizoaffective disorder, depressive type: Secondary | ICD-10-CM | POA: Diagnosis not present

## 2024-01-28 DIAGNOSIS — Z0000 Encounter for general adult medical examination without abnormal findings: Secondary | ICD-10-CM | POA: Diagnosis not present

## 2024-01-28 DIAGNOSIS — F4310 Post-traumatic stress disorder, unspecified: Secondary | ICD-10-CM

## 2024-01-28 DIAGNOSIS — F1911 Other psychoactive substance abuse, in remission: Secondary | ICD-10-CM

## 2024-01-28 DIAGNOSIS — F603 Borderline personality disorder: Secondary | ICD-10-CM

## 2024-01-28 DIAGNOSIS — F1091 Alcohol use, unspecified, in remission: Secondary | ICD-10-CM

## 2024-01-28 MED ORDER — FLUOXETINE HCL 40 MG PO CAPS
40.0000 mg | ORAL_CAPSULE | Freq: Every day | ORAL | 2 refills | Status: DC
  Filled 2024-01-28: qty 30, 30d supply, fill #0

## 2024-01-28 MED ORDER — TRAZODONE HCL 100 MG PO TABS
100.0000 mg | ORAL_TABLET | Freq: Every day | ORAL | 2 refills | Status: DC
  Filled 2024-01-28: qty 30, 30d supply, fill #0

## 2024-01-28 MED ORDER — GABAPENTIN 300 MG PO CAPS
300.0000 mg | ORAL_CAPSULE | Freq: Every evening | ORAL | 2 refills | Status: DC
  Filled 2024-01-28: qty 30, 30d supply, fill #0

## 2024-01-28 MED ORDER — ARIPIPRAZOLE 20 MG PO TABS
20.0000 mg | ORAL_TABLET | Freq: Every day | ORAL | 2 refills | Status: DC
  Filled 2024-01-28: qty 30, 30d supply, fill #0

## 2024-01-28 NOTE — Patient Instructions (Signed)
 Thank you for attending your appointment today.  -- START gabapentin  300 mg nightly -- If gabapentin  is helpful, decrease trazodone  to 150 mg nightly for a few nights then to 100 mg nightly -- Continue other medications as prescribed.  Please do not make any changes to medications without first discussing with your provider. If you are experiencing a psychiatric emergency, please call 911 or present to your nearest emergency department. Additional crisis, medication management, and therapy resources are included below.  [facility]  [address] [place], [place] [address] [phone] WALK-IN URGENT CARE 24/7 FOR ANYONE [address] [place], [place]  [phone] Fax: [phone] [email or web address] *Interpreters available *Accepts all insurance and uninsured for Urgent Care needs *Accepts Medicaid and uninsured for outpatient treatment (below)      ONLY FOR [facility]  Below:    Outpatient New Patient Assessment/Therapy Walk-ins:        Monday, Wednesday, and Thursday 8am until slots are full (first come, first served)                   New Patient Psychiatry/Medication Management        Monday-Friday 8am-11am (first come, first served)               For all walk-ins we ask that you arrive by 7:15am, because patients will be seen in the order of arrival.

## 2024-01-28 NOTE — Addendum Note (Signed)
 Addended by: Ulysess Gang A on: 01/28/2024 04:25 PM   Modules accepted: Level of Service

## 2024-01-29 ENCOUNTER — Other Ambulatory Visit: Payer: Self-pay

## 2024-02-12 ENCOUNTER — Ambulatory Visit (INDEPENDENT_AMBULATORY_CARE_PROVIDER_SITE_OTHER): Payer: Medicare (Managed Care) | Admitting: Licensed Clinical Social Worker

## 2024-02-12 DIAGNOSIS — F250 Schizoaffective disorder, bipolar type: Secondary | ICD-10-CM | POA: Diagnosis not present

## 2024-02-12 NOTE — Progress Notes (Signed)
   THERAPIST PROGRESS NOTE  Virtual Visit via Video Note  I connected with Amanda [name] on 02/12/24 at  3:00 PM EDT by a video enabled telemedicine application and verified that I am speaking with the correct person using two identifiers.  Location: Patient: Amanda [name]  Provider: Providers Home    I discussed the limitations of evaluation and management by telemedicine and the availability of in person appointments. The patient expressed understanding and agreed to proceed.      I discussed the assessment and treatment plan with the patient. The patient was provided an opportunity to ask questions and all were answered. The patient agreed with the plan and demonstrated an understanding of the instructions.   The patient was advised to call back or seek an in-person evaluation if the symptoms worsen or if the condition fails to improve as anticipated.  I provided 30 minutes of non-face-to-face time during this encounter.   [name], LCSW   Participation Level: Active  Behavioral Response: CasualAlertAnxious and Depressed  Type of Therapy: Individual Therapy  Treatment Goals addressed:   ProgressTowards Goals: Progressing  Interventions: CBT, Motivational Interviewing, and Supportive   Suicidal/Homicidal: Nowithout intent/plan  Therapist Response:   Amanda [name] was alert and oriented x 5.  He was pleasant, cooperative, making good eye contact.  Patient engaged well in therapy session was dressed casually.  He presented with euthymic mood\affect.   Patient comes in with no stressors to report at this time.  He states that his medications been working properly and that he has been taking them 7 out of 7 days/week.  Amanda [name] reports engaging in his coping skills such as writing, going on walks with his dog, and spending time at [employer].  Amanda [name] denies any suicidal or homicidal ideations.  Amanda [name] denies any auditory or visual hallucinations.  Amanda [name] now has stable housing  through a friend and has been able to start saving money and set it aside for other things needed in his day-to-day life.   Intervention/plan: LCSW utilized psychoanalytic therapy for patient to express thoughts, feelings and emotions and nonjudgmental environment.  LCSW utilized supportive therapy for praise and encouragement.  LCSW utilized strength-based therapy to continue to review goals and objectives with patient.  LCSW utilized motivational interviewing to reflective listening and open-ended questions.    Plan: Return again in 4 weeks.  Diagnosis: Schizoaffective disorder, bipolar type (HCC)  Collaboration of Care: Other None today   Patient/Guardian was advised Release of Information must be obtained prior to any record release in order to collaborate their care with an outside provider. Patient/Guardian was advised if they have not already done so to contact the registration department to sign all necessary forms in order for us  to release information regarding their care.   Consent: Patient/Guardian gives verbal consent for treatment and assignment of benefits for services provided during this visit. Patient/Guardian expressed understanding and agreed to proceed.   [name], LCSW 02/12/2024

## 2024-03-11 ENCOUNTER — Ambulatory Visit (HOSPITAL_COMMUNITY): Payer: Medicare (Managed Care) | Admitting: Licensed Clinical Social Worker

## 2024-03-11 DIAGNOSIS — F251 Schizoaffective disorder, depressive type: Secondary | ICD-10-CM

## 2024-03-11 DIAGNOSIS — F603 Borderline personality disorder: Secondary | ICD-10-CM

## 2024-03-11 NOTE — Progress Notes (Signed)
 THERAPIST PROGRESS NOTE  Virtual Visit via Video Note  I connected with Amanda [name] on 03/11/24 at  2:00 PM EDT by a video enabled telemedicine application and verified that I am speaking with the correct person using two identifiers.  Location: Patient: [facility] Provider: Provider's home office   I discussed the limitations of evaluation and management by telemedicine and the availability of in person appointments. The patient expressed understanding and agreed to proceed.    I discussed the assessment and treatment plan with the patient. The patient was provided an opportunity to ask questions and all were answered. The patient agreed with the plan and demonstrated an understanding of the instructions.   The patient was advised to call back or seek an in-person evaluation if the symptoms worsen or if the condition fails to improve as anticipated.  I provided 30 minutes of non-face-to-face time during this encounter.   Amanda GORMAN Patee, LCSW   Participation Level: Active  Behavioral Response: CasualAlertEuthymic  Type of Therapy: Individual Therapy  Treatment Goals addressed:  Active     Depression     STG: Marua [name] WILL COMPLETE AT LEAST 80% OF ASSIGNED HOMEWORK (Progressing)     Start:  09/25/21    Expected End:  04/17/24         STG: Reduce overall depression score by a minimum of 25% on the Patient Health Questionnaire (PHQ-9) or the Montgomery-Asberg Depression Rating Scale (MADRS) (Completed/Met)     Start:  09/25/21    Expected End:  05/18/22    Resolved:  12/13/21        Walk 3 x weekly  (Completed/Met)     Start:  10/17/21    Expected End:  10/18/22    Resolved:  08/21/22       Goal Note     Pt reports walking daily          Listening to classical or write 3 x weekly  (Completed/Met)     Start:  10/17/21    Expected End:  10/18/22    Resolved:  10/02/22       Goal Note     Pt reports listening to classical music to help  decrease depression and anxiety           Identify 3 trigger for depression and anxiety  (Progressing)     Start:  10/17/21    Expected End:  04/17/24            Decrease IP psych admission to less than 1 x per year.  (Completed/Met)     Start:  10/17/21    Expected End:  04/19/23    Resolved:  02/20/23       Goal Note     Pt has had no IP admission in the last year          WORK WITH Amanda Rush TO TRACK SYMPTOMS, TRIGGERS AND/OR SKILL USE THROUGH A MOOD CHART, DIARY CARD, OR JOURNAL (Completed)     Start:  09/25/21    End:  12/13/21      ENCOURAGE Amanda [name] TO PARTICIPATE IN RECOVERY PEER SUPPORT ACTIVITIES WEEKLY (Completed)     Start:  09/25/21    End:  01/02/22      PROVIDE Amanda Rush WITH EDUCATIONAL INFORMATION AND READING MATERIAL ON DISSOCIATION, ITS CAUSES, AND SYMPTOMS (Completed)     Start:  09/25/21    End:  12/13/21      WORK WITH Amanda [name] TO IDENTIFY THE  MAJOR COMPONENTS OF A RECENT EPISODE OF DEPRESSION: PHYSICAL SYMPTOMS, MAJOR THOUGHTS AND IMAGES, AND MAJOR BEHAVIORS THEY EXPERIENCED (Completed)     Start:  09/25/21    End:  12/13/21         ProgressTowards Goals: Progressing  Interventions: CBT, Motivational Interviewing, and Supportive   Suicidal/Homicidal: Nowithout intent/plan  Therapist Response:    [name] was alert and oriented x 5.  He was pleasant, cooperative, maintained good eye contact.  He engaged well in therapy session and was dressed casually.    Patient reports primary stressors as writing his  book.  Patient states that he gets distracted and when she gets distracted, she forgets about what she is writing about and will have to start over deleting everything.  LCSW and patient spoke about writing rough drafts first all the way through and then picking the best material she has for a second draft.  Patient was agreeable to try plan moving forward.  [name] reports no stressors currently.  [name] denies any suicidal or  homicidal ideations.  [name] denies any auditory or visual hallucinations.  He endorses utilizing coping skills such as support systems through his roommate and Lloyd Seip.    Patient does report that they had to rehome their dog due to landlord where patient is currently living not wanting dogs on the premises.  [name] states that once name is on the lease there are avenues, he can take to get landlord to be agreeable to keep the dog on property.   Intervention/plan: LCSW utilized psychoanalytic therapy for patient to express thoughts, feelings and concerns and nonjudgmental environment.  LCSW delay supportive therapy for praise and encouragement.  LCSW educated patient on the signs and symptoms of schizoaffective disorder and borderline personality disorder.  LCSW educated patient on taking medications as prescribed and consistently.    Plan: Return again in 3 weeks.  Diagnosis: Schizoaffective disorder, depressive type (HCC)  Collaboration of Care: Other Therapy virtually every 5 to 6 weeks   Patient/Guardian was advised Release of Information must be obtained prior to any record release in order to collaborate their care with an outside provider. Patient/Guardian was advised if they have not already done so to contact the registration department to sign all necessary forms in order for us  to release information regarding their care.   Consent: Patient/Guardian gives verbal consent for treatment and assignment of benefits for services provided during this visit. Patient/Guardian expressed understanding and agreed to proceed.   Amanda GORMAN Patee, LCSW 03/11/2024

## 2024-03-23 LAB — GLUCOSE, POCT (MANUAL RESULT ENTRY): Glucose Fasting, POC: 107 mg/dL — AB (ref 70–99)
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
## 2024-03-23 NOTE — Progress Notes (Signed)
 Patient came to mobile screening at [facility]. SDOH needs currently. Community resources given. We discussed maintaining an active healthy lifestyle.

## 2024-03-24 ENCOUNTER — Other Ambulatory Visit: Payer: Self-pay

## 2024-03-24 ENCOUNTER — Ambulatory Visit (INDEPENDENT_AMBULATORY_CARE_PROVIDER_SITE_OTHER): Payer: Medicare (Managed Care) | Admitting: Psychiatry

## 2024-03-24 ENCOUNTER — Encounter (HOSPITAL_COMMUNITY): Payer: Self-pay | Admitting: Psychiatry

## 2024-03-24 VITALS — Wt 150.8 lb

## 2024-03-24 DIAGNOSIS — Z79899 Other long term (current) drug therapy: Secondary | ICD-10-CM

## 2024-03-24 DIAGNOSIS — F603 Borderline personality disorder: Secondary | ICD-10-CM | POA: Diagnosis not present

## 2024-03-24 DIAGNOSIS — F1091 Alcohol use, unspecified, in remission: Secondary | ICD-10-CM

## 2024-03-24 DIAGNOSIS — F4310 Post-traumatic stress disorder, unspecified: Secondary | ICD-10-CM

## 2024-03-24 DIAGNOSIS — F251 Schizoaffective disorder, depressive type: Secondary | ICD-10-CM | POA: Diagnosis not present

## 2024-03-24 DIAGNOSIS — F411 Generalized anxiety disorder: Secondary | ICD-10-CM

## 2024-03-24 MED ORDER — ARIPIPRAZOLE 20 MG PO TABS
20.0000 mg | ORAL_TABLET | Freq: Every day | ORAL | 2 refills | Status: DC
  Filled 2024-03-24: qty 30, 30d supply, fill #0

## 2024-03-24 MED ORDER — HYDROXYZINE HCL 25 MG PO TABS
25.0000 mg | ORAL_TABLET | Freq: Every day | ORAL | 2 refills | Status: DC | PRN
  Filled 2024-03-24: qty 30, 30d supply, fill #0

## 2024-03-24 MED ORDER — GABAPENTIN 300 MG PO CAPS
300.0000 mg | ORAL_CAPSULE | Freq: Every evening | ORAL | 2 refills | Status: DC
  Filled 2024-03-24 – 2024-06-12 (×2): qty 30, 30d supply, fill #0

## 2024-03-24 MED ORDER — FLUOXETINE HCL 40 MG PO CAPS
40.0000 mg | ORAL_CAPSULE | Freq: Every day | ORAL | 2 refills | Status: DC
  Filled 2024-03-24: qty 30, 30d supply, fill #0

## 2024-03-24 NOTE — Patient Instructions (Addendum)
 Thank you for attending your appointment today.  -- STOP trazodone  -- Continue other medications as prescribed.  Please do not make any changes to medications without first discussing with your provider. If you are experiencing a psychiatric emergency, please call 911 or present to your nearest emergency department. Additional crisis, medication management, and therapy resources are included below.  [facility]  [address] [place], [place] [address] [phone] WALK-IN URGENT CARE 24/7 FOR ANYONE [address] [place], [place]  [phone] Fax: [phone] [email or web address] *Interpreters available *Accepts all insurance and uninsured for Urgent Care needs *Accepts Medicaid and uninsured for outpatient treatment (below)      ONLY FOR [facility]  Below:    Outpatient New Patient Assessment/Therapy Walk-ins:        Monday, Wednesday, and Thursday 8am until slots are full (first come, first served)                   New Patient Psychiatry/Medication Management        Monday-Friday 8am-11am (first come, first served)               For all walk-ins we ask that you arrive by 7:15am, because patients will be seen in the order of arrival.

## 2024-03-31 ENCOUNTER — Other Ambulatory Visit: Payer: Self-pay

## 2024-04-01 ENCOUNTER — Other Ambulatory Visit: Payer: Self-pay

## 2024-04-06 ENCOUNTER — Other Ambulatory Visit (INDEPENDENT_AMBULATORY_CARE_PROVIDER_SITE_OTHER): Payer: Medicare (Managed Care)

## 2024-04-06 ENCOUNTER — Other Ambulatory Visit (HOSPITAL_COMMUNITY): Payer: Self-pay | Admitting: Psychiatry

## 2024-04-06 ENCOUNTER — Other Ambulatory Visit (HOSPITAL_COMMUNITY): Payer: Self-pay

## 2024-04-06 DIAGNOSIS — Z79899 Other long term (current) drug therapy: Secondary | ICD-10-CM

## 2024-04-06 NOTE — Progress Notes (Signed)
 Pt tolerated labs well , labs were drawn from the right hand with no issue or complaints . Pt is to follow up with provider for results

## 2024-04-09 LAB — SPECIMEN STATUS

## 2024-04-10 ENCOUNTER — Ambulatory Visit (HOSPITAL_COMMUNITY): Payer: Self-pay | Admitting: Psychiatry

## 2024-04-10 LAB — SPECIMEN STATUS REPORT

## 2024-04-16 LAB — CBC WITH DIFFERENTIAL/PLATELET
Basophils Absolute: 0.1 x10E3/uL
Basos: 1 %
EOS (ABSOLUTE): 0 x10E3/uL
Eos: 0 %
Hematocrit: 46.5 %
Hemoglobin: 14.9 g/dL
Immature Grans (Abs): 0 x10E3/uL
Immature Granulocytes: 0 %
Lymphocytes Absolute: 2 x10E3/uL
Lymphs: 21 %
MCH: 31.4 pg
MCHC: 32 g/dL
MCV: 98 fL
Monocytes Absolute: 0.6 x10E3/uL
Monocytes: 6 %
Neutrophils Absolute: 6.7 x10E3/uL
Neutrophils: 72 %
Platelets: 221 x10E3/uL (ref 150–450)
RBC: 4.75 x10E6/uL
RDW: 12.8 %
WBC: 9.5 x10E3/uL (ref 3.4–10.8)

## 2024-04-16 LAB — COMPREHENSIVE METABOLIC PANEL WITH GFR
ALT: 8 IU/L
AST: 17 IU/L (ref 0–40)
Albumin: 4.1 g/dL
Alkaline Phosphatase: 72 IU/L (ref 44–121)
BUN/Creatinine Ratio: 20
BUN: 14 mg/dL
Bilirubin Total: 0.4 mg/dL (ref 0.0–1.2)
CO2: 16 mmol/L — ABNORMAL LOW (ref 20–29)
Calcium: 9 mg/dL
Chloride: 106 mmol/L (ref 96–106)
Creatinine, Ser: 0.69 mg/dL
Globulin, Total: 2.4 g/dL (ref 1.5–4.5)
Glucose: 89 mg/dL (ref 70–99)
Potassium: 4.8 mmol/L (ref 3.5–5.2)
Sodium: 138 mmol/L (ref 134–144)
Total Protein: 6.5 g/dL (ref 6.0–8.5)

## 2024-04-16 LAB — LIPID PANEL W/O CHOL/HDL RATIO
Cholesterol, Total: 130 mg/dL
HDL: 42 mg/dL (ref >39)
LDL Chol Calc (NIH): 76 mg/dL
Triglycerides: 56 mg/dL
VLDL Cholesterol Cal: 12 mg/dL (ref 5–40)

## 2024-04-16 LAB — HGB A1C W/O EAG: Hgb A1c MFr Bld: 5.2 % (ref 4.8–5.6)

## 2024-04-16 LAB — SPECIMEN STATUS REPORT

## 2024-04-17 ENCOUNTER — Ambulatory Visit (HOSPITAL_COMMUNITY): Payer: Medicare (Managed Care) | Admitting: Licensed Clinical Social Worker

## 2024-04-17 DIAGNOSIS — F251 Schizoaffective disorder, depressive type: Secondary | ICD-10-CM | POA: Diagnosis not present

## 2024-04-17 NOTE — Progress Notes (Signed)
 THERAPIST PROGRESS NOTE  Virtual Visit via Video Note  I connected with Amanda [name] on 04/17/24 at 11:00 AM EDT by a video enabled telemedicine application and verified that I am speaking with the correct person using two identifiers.  Location: Patient: [facility]  Provider: Providers Home Office    I discussed the limitations of evaluation and management by telemedicine and the availability of in person appointments. The patient expressed understanding and agreed to proceed.  I discussed the assessment and treatment plan with the patient. The patient was provided an opportunity to ask questions and all were answered. The patient agreed with the plan and demonstrated an understanding of the instructions.   The patient was advised to call back or seek an in-person evaluation if the symptoms worsen or if the condition fails to improve as anticipated.  I provided 30 minutes of non-face-to-face time during this encounter.   [name], LCSW   Participation Level: Active  Behavioral Response: CasualAlertAnxious  Type of Therapy: Individual Therapy  Treatment Goals addressed:  Active     Depression     STG: Amanda [name] WILL COMPLETE AT LEAST 80% OF ASSIGNED HOMEWORK (Progressing)     Start:  09/25/21    Expected End:  09/18/24         STG: Reduce overall depression score by a minimum of 25% on the Patient Health Questionnaire (PHQ-9) or the Montgomery-Asberg Depression Rating Scale (MADRS) (Completed/Met)     Start:  09/25/21    Expected End:  05/18/22    Resolved:  12/13/21        Walk 3 x weekly  (Completed/Met)     Start:  10/17/21    Expected End:  10/18/22    Resolved:  08/21/22       Goal Note     Pt reports walking daily          Listening to classical or write 3 x weekly  (Completed/Met)     Start:  10/17/21    Expected End:  10/18/22    Resolved:  10/02/22       Goal Note     Pt reports listening to classical music to help decrease  depression and anxiety           Identify 3 trigger for depression and anxiety  (Progressing)     Start:  10/17/21    Expected End:  09/18/24            Decrease IP psych admission to less than 1 x per year.  (Completed/Met)     Start:  10/17/21    Expected End:  04/19/23    Resolved:  02/20/23       Goal Note     Pt has had no IP admission in the last year          WORK WITH Amanda [name] TO TRACK SYMPTOMS, TRIGGERS AND/OR SKILL USE THROUGH A MOOD CHART, DIARY CARD, OR JOURNAL (Completed)     Start:  09/25/21    End:  12/13/21      ENCOURAGE Amanda [name] TO PARTICIPATE IN RECOVERY PEER SUPPORT ACTIVITIES WEEKLY (Completed)     Start:  09/25/21    End:  01/02/22      PROVIDE Amanda [name] WITH EDUCATIONAL INFORMATION AND READING MATERIAL ON DISSOCIATION, ITS CAUSES, AND SYMPTOMS (Completed)     Start:  09/25/21    End:  12/13/21      WORK WITH Amanda [name] TO IDENTIFY THE MAJOR  COMPONENTS OF A RECENT EPISODE OF DEPRESSION: PHYSICAL SYMPTOMS, MAJOR THOUGHTS AND IMAGES, AND MAJOR BEHAVIORS THEY EXPERIENCED (Completed)     Start:  09/25/21    End:  12/13/21         ProgressTowards Goals: Progressing  Interventions: CBT, Motivational Interviewing, and Supportive   Suicidal/Homicidal: Nowithout intent/plan  Therapist Response:   [name] was alert and oriented x 5. He was dressed casually and engaged well in therapy session. Pt presented with euthymic mood/affect. [name] was pleasant, cooperative, and maintained good eye contact.   Pt reports overall decrease in symptoms for SI, worthlessness, helplessness, and AH/VH. Pt does endorses some VH for seeing unicorn but nothing such as demands which has triggered SI in the past. Pt attributes this to social drivers becoming more stable for housing. AEB finding an apartment with her friend. She reports increase social supports through [employer] which is a non profit for [employer].    Interventions/Plan: LCSW  validated feeling in session. LCSW used supportive therapy for praise and encouragement. LCSW educated pt on the benefits of social support systems such as friends and Sales promotion account executive through [employer] for the [employer]. LCSW educated pt on the benefits of maintaining medication regiment to decrease psychosis symptoms. LCSW review safety plan with pt for Hx of SI and hospitalizations       Plan: Return again in 6 weeks.  Diagnosis: Schizoaffective disorder, depressive type (HCC)  Collaboration of Care: Other Continued individual therapy   Patient/Guardian was advised Release of Information must be obtained prior to any record release in order to collaborate their care with an outside provider. Patient/Guardian was advised if they have not already done so to contact the registration department to sign all necessary forms in order for us  to release information regarding their care.   Consent: Patient/Guardian gives verbal consent for treatment and assignment of benefits for services provided during this visit. Patient/Guardian expressed understanding and agreed to proceed.   [name], LCSW 04/17/2024

## 2024-05-21 NOTE — Progress Notes (Addendum)
 Pt attended 03/23/24 screening event where his bp was 129/80 and blood glucose was 107. Pt did not document having a PCP and documented that he does have insurance. Pt also noted SDOH needs for food and housing. Pt used [place] address on screening form which may indicate that pt is part of the unhoused population.  Chart review indicated that pt does not have a PCP but has been working with a Set designer team at [facility]. Pt does have 2 up and coming BH appts on 06/03/24 and 06/16/24. Pt also has an up and coming appt with [facility] This CHW made 1st attempt call to follow up with pt on SDOH food and housing need. Pt answered and stated that he is moving to [place]  next month to live with his sister and brother in law and is currently staying with his friends. Pt also stated that he has been eating at the local food banks and he has been receiving food from the local food pantries. Pt is looking forward to getting a fresh start in [place]  and does plan on transferring [facility] when he moves. Pt is confident in his plan and feels he does not need any resources at this time.  No follow up to be scheduled per health equity protocol.

## 2024-06-03 ENCOUNTER — Ambulatory Visit (HOSPITAL_COMMUNITY): Payer: Medicare (Managed Care) | Admitting: Licensed Clinical Social Worker

## 2024-06-03 ENCOUNTER — Ambulatory Visit (INDEPENDENT_AMBULATORY_CARE_PROVIDER_SITE_OTHER): Admitting: Licensed Clinical Social Worker

## 2024-06-03 DIAGNOSIS — F422 Mixed obsessional thoughts and acts: Secondary | ICD-10-CM

## 2024-06-03 DIAGNOSIS — F251 Schizoaffective disorder, depressive type: Secondary | ICD-10-CM

## 2024-06-03 NOTE — Progress Notes (Unsigned)
 THERAPIST PROGRESS NOTE  Session Time: 56   Participation Level: Active  Behavioral Response: CasualAlertDepressed and Dysphoric  Type of Therapy: Individual Therapy  Treatment Goals addressed:  Active     Depression     STG: Amanda [name] WILL COMPLETE AT LEAST 80% OF ASSIGNED HOMEWORK (Progressing)     Start:  09/25/21    Expected End:  09/18/24         STG: Reduce overall depression score by a minimum of 25% on the Patient Health Questionnaire (PHQ-9) or the Montgomery-Asberg Depression Rating Scale (MADRS) (Completed/Met)     Start:  09/25/21    Expected End:  05/18/22    Resolved:  12/13/21        Walk 3 x weekly  (Completed/Met)     Start:  10/17/21    Expected End:  10/18/22    Resolved:  08/21/22       Goal Note     Pt reports walking daily          Listening to classical or write 3 x weekly  (Completed/Met)     Start:  10/17/21    Expected End:  10/18/22    Resolved:  10/02/22       Goal Note     Pt reports listening to classical music to help decrease depression and anxiety           Identify 3 trigger for depression and anxiety  (Progressing)     Start:  10/17/21    Expected End:  09/18/24            Decrease IP psych admission to less than 1 x per year.  (Completed/Met)     Start:  10/17/21    Expected End:  04/19/23    Resolved:  02/20/23       Goal Note     Pt has had no IP admission in the last year          WORK WITH Amanda [name] TO TRACK SYMPTOMS, TRIGGERS AND/OR SKILL USE THROUGH A MOOD CHART, DIARY CARD, OR JOURNAL (Completed)     Start:  09/25/21    End:  12/13/21      ENCOURAGE Amanda [name] TO PARTICIPATE IN RECOVERY PEER SUPPORT ACTIVITIES WEEKLY (Completed)     Start:  09/25/21    End:  01/02/22      PROVIDE Amanda [name] WITH EDUCATIONAL INFORMATION AND READING MATERIAL ON DISSOCIATION, ITS CAUSES, AND SYMPTOMS (Completed)     Start:  09/25/21    End:  12/13/21      WORK WITH Amanda [name] TO IDENTIFY THE MAJOR  COMPONENTS OF A RECENT EPISODE OF DEPRESSION: PHYSICAL SYMPTOMS, MAJOR THOUGHTS AND IMAGES, AND MAJOR BEHAVIORS THEY EXPERIENCED (Completed)     Start:  09/25/21    End:  12/13/21         ProgressTowards Goals: Progressing  Interventions: CBT, Motivational Interviewing, and Supportive  Summary: Amanda [name] is a 43 y.o. adult who presents alert and oriented x 5.  He was pleasant, cooperative, maintained good eye contact.  Patient engaged well in therapy session and was dressed casually.  He presented with anxious mood\affect.  Patient endorses an increase in auditory hallucinations as evidenced by the voices are speaking [place] and I do not speak more than 3 words a [place].  Patient also notes an increase in suicidal ideations with no plan or intent.  LCSW reiterated safety plan with patient for be [facility]  at [facility] [address].  or 988 suicide prevention hotline.  Patient verbally agreeable.  [name] has coping skills for auditory hallucinations for writing and listening to music but states they increased during times of stress.  Stressors indicated today are new neighbors being very loud and fighting.  LCSW encouraged patient to advocate for self by going to the landlord if the noise continues.  Patient states that they have going to the landlord ,however they states that they would need proof that it is not just the TV.  LCSW encouraged patient to record the fights if they continued.    Other coping skills are his support system at [employer] going there Monday through Thursday.  [name] and LCSW spoke today about walking appointments for medication management as patient is currently taking medications but having an increase in auditory hallucinations.  Next appointment is not for another month.  Patient agreeable that have voices increase in intensity or frequency he will go to walk-in appointments if he is not in a crisis situation.  LCSW educated patient on the difference between  crisis situation such as suicidal ideations with plan or intent.  If suicidal ideations are present safety plan would come first.     Suicidal/Homicidal: Yeswithout intent/plan  Therapist Response:    Intervention/Plan: LCSW safety plan with patient as indicated above.  LCSW educated patient on the use of coping skills to help decrease auditory hallucinations.  LCSW encouraged patient to utilize support systems such as [employer] or friends.  LCSW utilized supportive therapy for praise and encouragement.  LCSW utilized person centered therapy for advocacy and empowerment.  LCSW utilize DBT for the acronym T IPP.  This is emotional regulation techniques for temperature change, intensive exercise, progressive muscle relaxation, and paced breathing.    LCSW and patient discussed today LCSW's transferred to [facility] office with the option to transfer, however patient does utilize public transportation not be able to come for in person appointments.  Patient declined transfer to [place] office with the option to continue therapeutic services with the [facility] behavioral health therapist.  Plan: Return again in 4 weeks.  Diagnosis: Schizoaffective disorder, depressive type (HCC)  Collaboration of Care: Other continued individual therapy   Patient/Guardian was advised Release of Information must be obtained prior to any record release in order to collaborate their care with an outside provider. Patient/Guardian was advised if they have not already done so to contact the registration department to sign all necessary forms in order for us  to release information regarding their care.   Consent: Patient/Guardian gives verbal consent for treatment and assignment of benefits for services provided during this visit. Patient/Guardian expressed understanding and agreed to proceed.   [name], LCSW 06/03/2024

## 2024-06-05 ENCOUNTER — Ambulatory Visit (HOSPITAL_COMMUNITY)
Admission: EM | Admit: 2024-06-05 | Discharge: 2024-06-06 | Disposition: A | Attending: Nurse Practitioner | Admitting: Nurse Practitioner

## 2024-06-05 DIAGNOSIS — Z638 Other specified problems related to primary support group: Secondary | ICD-10-CM | POA: Diagnosis not present

## 2024-06-05 DIAGNOSIS — F259 Schizoaffective disorder, unspecified: Secondary | ICD-10-CM | POA: Diagnosis present

## 2024-06-05 DIAGNOSIS — F429 Obsessive-compulsive disorder, unspecified: Secondary | ICD-10-CM | POA: Insufficient documentation

## 2024-06-05 DIAGNOSIS — G4700 Insomnia, unspecified: Secondary | ICD-10-CM | POA: Insufficient documentation

## 2024-06-05 DIAGNOSIS — Z79899 Other long term (current) drug therapy: Secondary | ICD-10-CM | POA: Insufficient documentation

## 2024-06-05 DIAGNOSIS — F4310 Post-traumatic stress disorder, unspecified: Secondary | ICD-10-CM | POA: Insufficient documentation

## 2024-06-05 DIAGNOSIS — F419 Anxiety disorder, unspecified: Secondary | ICD-10-CM | POA: Insufficient documentation

## 2024-06-05 DIAGNOSIS — F250 Schizoaffective disorder, bipolar type: Secondary | ICD-10-CM | POA: Diagnosis not present

## 2024-06-05 LAB — POCT URINE DRUG SCREEN - MANUAL ENTRY (I-SCREEN)
POC Amphetamine UR: NOT DETECTED
POC Buprenorphine (BUP): NOT DETECTED
POC Cocaine UR: NOT DETECTED
POC Marijuana UR: NOT DETECTED
POC Methadone UR: NOT DETECTED
POC Methamphetamine UR: NOT DETECTED
POC Morphine: NOT DETECTED
POC Oxazepam (BZO): NOT DETECTED
POC Oxycodone UR: NOT DETECTED
POC Secobarbital (BAR): NOT DETECTED

## 2024-06-05 LAB — CBC WITH DIFFERENTIAL/PLATELET
Abs Immature Granulocytes: 0.05 K/uL (ref 0.00–0.07)
Basophils Absolute: 0.1 K/uL (ref 0.0–0.1)
Basophils Relative: 1 %
Eosinophils Absolute: 0.1 K/uL (ref 0.0–0.5)
Eosinophils Relative: 1 %
HCT: 43.1 % (ref 36.0–46.0)
Hemoglobin: 14.8 g/dL (ref 12.0–15.0)
Immature Granulocytes: 0 %
Lymphocytes Relative: 29 %
Lymphs Abs: 3.6 K/uL (ref 0.7–4.0)
MCH: 31.8 pg (ref 26.0–34.0)
MCHC: 34.3 g/dL (ref 30.0–36.0)
MCV: 92.7 fL (ref 80.0–100.0)
Monocytes Absolute: 0.9 K/uL (ref 0.1–1.0)
Monocytes Relative: 7 %
Neutro Abs: 7.5 K/uL (ref 1.7–7.7)
Neutrophils Relative %: 62 %
Platelets: 302 K/uL (ref 150–400)
RBC: 4.65 MIL/uL (ref 3.87–5.11)
RDW: 12.9 % (ref 11.5–15.5)
WBC: 12.3 K/uL — ABNORMAL HIGH (ref 4.0–10.5)
nRBC: 0 % (ref 0.0–0.2)

## 2024-06-05 LAB — URINALYSIS, ROUTINE W REFLEX MICROSCOPIC
Bacteria, UA: NONE SEEN
Bilirubin Urine: NEGATIVE
Glucose, UA: NEGATIVE mg/dL
Ketones, ur: NEGATIVE mg/dL
Nitrite: NEGATIVE
Protein, ur: NEGATIVE mg/dL
Specific Gravity, Urine: 1.011 (ref 1.005–1.030)
pH: 6 (ref 5.0–8.0)

## 2024-06-05 LAB — HEMOGLOBIN A1C
Hgb A1c MFr Bld: 4.7 % — ABNORMAL LOW (ref 4.8–5.6)
Mean Plasma Glucose: 88.19 mg/dL

## 2024-06-05 LAB — ETHANOL: Alcohol, Ethyl (B): <15 mg/dL (ref <15)

## 2024-06-05 LAB — POC URINE PREG, ED: Preg Test, Ur: NEGATIVE

## 2024-06-05 MED ORDER — HALOPERIDOL LACTATE 5 MG/ML IJ SOLN: 10.0000 mg | Freq: Three times a day (TID) | INTRAMUSCULAR | Status: DC | PRN

## 2024-06-05 MED ORDER — ALUM & MAG HYDROXIDE-SIMETH 200-200-20 MG/5ML PO SUSP: 30.0000 mL | ORAL | Status: DC | PRN

## 2024-06-05 MED ORDER — ACETAMINOPHEN 325 MG PO TABS: 650.0000 mg | ORAL_TABLET | Freq: Four times a day (QID) | ORAL | Status: DC | PRN

## 2024-06-05 MED ORDER — TRAZODONE HCL 100 MG PO TABS: 100.0000 mg | ORAL_TABLET | Freq: Every evening | ORAL | Status: DC | PRN

## 2024-06-05 MED ORDER — HYDROXYZINE HCL 25 MG PO TABS: 25.0000 mg | ORAL_TABLET | Freq: Three times a day (TID) | ORAL | Status: DC | PRN

## 2024-06-05 MED ORDER — HALOPERIDOL LACTATE 5 MG/ML IJ SOLN: 5.0000 mg | Freq: Three times a day (TID) | INTRAMUSCULAR | Status: DC | PRN

## 2024-06-05 MED ORDER — LORAZEPAM 2 MG/ML IJ SOLN: 2.0000 mg | Freq: Three times a day (TID) | INTRAMUSCULAR | Status: DC | PRN

## 2024-06-05 MED ORDER — HALOPERIDOL 5 MG PO TABS: 5.0000 mg | ORAL_TABLET | Freq: Three times a day (TID) | ORAL | Status: DC | PRN

## 2024-06-05 MED ORDER — MAGNESIUM HYDROXIDE 400 MG/5ML PO SUSP: 30.0000 mL | Freq: Every day | ORAL | Status: DC | PRN

## 2024-06-05 NOTE — Progress Notes (Signed)
   06/05/24 1732  [facility] Triage Screening (Walk-ins at [facility] only)  How Did You Hear About Us ? Self  What Is the Reason for Your Visit/Call Today? [name] is a 15Y female (uses he/him pronouns) presenting to [facility] vol with a friend. Pt states that he has been seeing shadow figures that are holding bloody knives. Pt states that these figures are telling him to kill yourself or we will. Pt endorses current SI with a plan to overdose on benadryl . Pt denies HI and substance use. Pt has a hx of Anxiety, Bipolar 1, BPD, OCD, Depression, PTSD, and Schizoaffective Disorder Bipolar Type.  How Long Has This Been Causing You Problems? 1 wk - 1 month  Have You Recently Had Any Thoughts About Hurting Yourself? Yes  How long ago did you have thoughts about hurting yourself? Today  Are You Planning to Commit Suicide/Harm Yourself At This time? Yes  Have you Recently Had Thoughts About Hurting Someone Sherral? No  Are You Planning To Harm Someone At This Time? No  Are you currently experiencing any auditory, visual or other hallucinations? Yes  Please explain the hallucinations you are currently experiencing: seeing shadow figures and pools of blood  Have You Used Any Alcohol or Drugs in the Past 24 Hours? No  Do you have any current medical co-morbidities that require immediate attention? No  What Do You Feel Would Help You the Most Today? Treatment for Depression or other mood problem  Determination of Need Urgent (48 hours)  Options For Referral Inpatient Hospitalization;BH Urgent Care;Intensive Outpatient Therapy  Determination of Need filed? Yes
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
## 2024-06-06 ENCOUNTER — Inpatient Hospital Stay
Admission: AD | Admit: 2024-06-06 | Discharge: 2024-06-12 | DRG: 885 | Disposition: A | Discharge status: Home / Self Care | Source: Intra-hospital | Attending: Child & Adolescent Psychiatry | Admitting: Child & Adolescent Psychiatry

## 2024-06-06 ENCOUNTER — Encounter: Payer: Self-pay | Admitting: Child & Adolescent Psychiatry

## 2024-06-06 ENCOUNTER — Other Ambulatory Visit: Payer: Self-pay

## 2024-06-06 DIAGNOSIS — F419 Anxiety disorder, unspecified: Secondary | ICD-10-CM | POA: Diagnosis present

## 2024-06-06 DIAGNOSIS — J45909 Unspecified asthma, uncomplicated: Secondary | ICD-10-CM | POA: Diagnosis present

## 2024-06-06 DIAGNOSIS — Z8673 Personal history of transient ischemic attack (TIA), and cerebral infarction without residual deficits: Secondary | ICD-10-CM | POA: Diagnosis not present

## 2024-06-06 DIAGNOSIS — Z9152 Personal history of nonsuicidal self-harm: Secondary | ICD-10-CM

## 2024-06-06 DIAGNOSIS — Z811 Family history of alcohol abuse and dependence: Secondary | ICD-10-CM

## 2024-06-06 DIAGNOSIS — Z800 Family history of malignant neoplasm of digestive organs: Secondary | ICD-10-CM

## 2024-06-06 DIAGNOSIS — Z9109 Other allergy status, other than to drugs and biological substances: Secondary | ICD-10-CM

## 2024-06-06 DIAGNOSIS — F4312 Post-traumatic stress disorder, chronic: Secondary | ICD-10-CM | POA: Diagnosis present

## 2024-06-06 DIAGNOSIS — M069 Rheumatoid arthritis, unspecified: Secondary | ICD-10-CM | POA: Diagnosis present

## 2024-06-06 DIAGNOSIS — Z5948 Other specified lack of adequate food: Secondary | ICD-10-CM

## 2024-06-06 DIAGNOSIS — Z6372 Alcoholism and drug addiction in family: Secondary | ICD-10-CM | POA: Diagnosis not present

## 2024-06-06 DIAGNOSIS — Z818 Family history of other mental and behavioral disorders: Secondary | ICD-10-CM

## 2024-06-06 DIAGNOSIS — Z5901 Sheltered homelessness: Secondary | ICD-10-CM | POA: Diagnosis not present

## 2024-06-06 DIAGNOSIS — I10 Essential (primary) hypertension: Secondary | ICD-10-CM | POA: Diagnosis present

## 2024-06-06 DIAGNOSIS — Z833 Family history of diabetes mellitus: Secondary | ICD-10-CM | POA: Diagnosis not present

## 2024-06-06 DIAGNOSIS — F17210 Nicotine dependence, cigarettes, uncomplicated: Secondary | ICD-10-CM | POA: Diagnosis present

## 2024-06-06 DIAGNOSIS — Z79899 Other long term (current) drug therapy: Secondary | ICD-10-CM

## 2024-06-06 DIAGNOSIS — F17290 Nicotine dependence, other tobacco product, uncomplicated: Secondary | ICD-10-CM | POA: Diagnosis present

## 2024-06-06 DIAGNOSIS — Z823 Family history of stroke: Secondary | ICD-10-CM | POA: Diagnosis not present

## 2024-06-06 DIAGNOSIS — Z801 Family history of malignant neoplasm of trachea, bronchus and lung: Secondary | ICD-10-CM

## 2024-06-06 DIAGNOSIS — Z8249 Family history of ischemic heart disease and other diseases of the circulatory system: Secondary | ICD-10-CM | POA: Diagnosis not present

## 2024-06-06 DIAGNOSIS — Z5941 Food insecurity: Secondary | ICD-10-CM

## 2024-06-06 DIAGNOSIS — F429 Obsessive-compulsive disorder, unspecified: Secondary | ICD-10-CM | POA: Diagnosis present

## 2024-06-06 DIAGNOSIS — F250 Schizoaffective disorder, bipolar type: Secondary | ICD-10-CM | POA: Diagnosis present

## 2024-06-06 DIAGNOSIS — H9191 Unspecified hearing loss, right ear: Secondary | ICD-10-CM | POA: Diagnosis present

## 2024-06-06 DIAGNOSIS — R45851 Suicidal ideations: Secondary | ICD-10-CM | POA: Diagnosis present

## 2024-06-06 LAB — COMPREHENSIVE METABOLIC PANEL WITH GFR
ALT: 14 U/L (ref 0–44)
AST: 21 U/L (ref 15–41)
Albumin: 3.6 g/dL (ref 3.5–5.0)
Alkaline Phosphatase: 55 U/L (ref 38–126)
Anion gap: 11 (ref 5–15)
BUN: 12 mg/dL (ref 6–20)
CO2: 24 mmol/L (ref 22–32)
Calcium: 9 mg/dL (ref 8.9–10.3)
Chloride: 104 mmol/L (ref 98–111)
Creatinine, Ser: 0.79 mg/dL (ref 0.44–1.00)
GFR, Estimated: >60 mL/min (ref >60)
Glucose, Bld: 140 mg/dL — ABNORMAL HIGH (ref 70–99)
Potassium: 3.7 mmol/L (ref 3.5–5.1)
Sodium: 139 mmol/L (ref 135–145)
Total Bilirubin: 1.2 mg/dL (ref 0.0–1.2)
Total Protein: 6.4 g/dL — ABNORMAL LOW (ref 6.5–8.1)

## 2024-06-06 LAB — TSH: TSH: 2.524 u[IU]/mL (ref 0.350–4.500)

## 2024-06-06 LAB — LIPID PANEL
Cholesterol: 158 mg/dL (ref 0–200)
HDL: 44 mg/dL (ref >40)
LDL Cholesterol: 103 mg/dL — ABNORMAL HIGH (ref 0–99)
Total CHOL/HDL Ratio: 3.6 ratio
Triglycerides: 54 mg/dL (ref <150)
VLDL: 11 mg/dL (ref 0–40)

## 2024-06-06 LAB — MAGNESIUM: Magnesium: 2 mg/dL (ref 1.7–2.4)

## 2024-06-06 LAB — HIV ANTIBODY (ROUTINE TESTING W REFLEX): HIV Screen 4th Generation wRfx: NONREACTIVE

## 2024-06-06 MED ORDER — FLUOXETINE HCL 20 MG PO CAPS
40.0000 mg | ORAL_CAPSULE | Freq: Every day | ORAL | Status: DC
  Administered 2024-06-07: 40 mg via ORAL
  Filled 2024-06-06: qty 2

## 2024-06-06 MED ORDER — ALUM & MAG HYDROXIDE-SIMETH 200-200-20 MG/5ML PO SUSP: 30.0000 mL | ORAL | Status: DC | PRN

## 2024-06-06 MED ORDER — MAGNESIUM HYDROXIDE 400 MG/5ML PO SUSP: 30.0000 mL | Freq: Every day | ORAL | Status: DC | PRN

## 2024-06-06 MED ORDER — PALIPERIDONE ER 3 MG PO TB24
3.0000 mg | ORAL_TABLET | Freq: Two times a day (BID) | ORAL | Status: DC
  Administered 2024-06-06 – 2024-06-09 (×6): 3 mg via ORAL
  Filled 2024-06-06 (×7): qty 1

## 2024-06-06 MED ORDER — GABAPENTIN 300 MG PO CAPS
300.0000 mg | ORAL_CAPSULE | Freq: Every day | ORAL | Status: DC
  Administered 2024-06-06 – 2024-06-11 (×6): 300 mg via ORAL
  Filled 2024-06-06 (×6): qty 1

## 2024-06-06 MED ORDER — ENSURE PLUS HIGH PROTEIN PO LIQD: 237.0000 mL | Freq: Two times a day (BID) | ORAL | Status: DC

## 2024-06-06 MED ORDER — FLUOXETINE HCL 20 MG PO CAPS
40.0000 mg | ORAL_CAPSULE | Freq: Every day | ORAL | Status: DC
  Administered 2024-06-06: 40 mg via ORAL
  Filled 2024-06-06 (×2): qty 2

## 2024-06-06 MED ORDER — PALIPERIDONE ER 3 MG PO TB24
3.0000 mg | ORAL_TABLET | Freq: Two times a day (BID) | ORAL | Status: DC
  Administered 2024-06-06: 3 mg via ORAL
  Filled 2024-06-06: qty 1

## 2024-06-06 MED ORDER — ACETAMINOPHEN 325 MG PO TABS: 650.0000 mg | ORAL_TABLET | Freq: Four times a day (QID) | ORAL | Status: DC | PRN

## 2024-06-06 MED ORDER — NICOTINE 14 MG/24HR TD PT24
14.0000 mg | MEDICATED_PATCH | Freq: Every day | TRANSDERMAL | Status: DC
  Administered 2024-06-07 – 2024-06-12 (×6): 14 mg via TRANSDERMAL
  Filled 2024-06-06 (×6): qty 1

## 2024-06-06 MED ORDER — HYDROXYZINE HCL 25 MG PO TABS
25.0000 mg | ORAL_TABLET | Freq: Three times a day (TID) | ORAL | Status: DC | PRN
  Administered 2024-06-06 – 2024-06-09 (×3): 25 mg via ORAL
  Filled 2024-06-06 (×3): qty 1

## 2024-06-06 MED ORDER — ENSURE PLUS HIGH PROTEIN PO LIQD
237.0000 mL | Freq: Two times a day (BID) | ORAL | Status: DC
  Administered 2024-06-08 – 2024-06-10 (×6): 237 mL via ORAL

## 2024-06-06 MED ORDER — GABAPENTIN 300 MG PO CAPS: 300.0000 mg | ORAL_CAPSULE | Freq: Every day | ORAL | Status: DC

## 2024-06-06 NOTE — ED Notes (Signed)
 Pt A&Ox4, calm & cooperative and in NAD at this time. Denies HI. Endorses self harm thoughts of chewing the inside of his mouth. Endorses AVH, but reports keeping eyes closed helps w/ the [facility]. Contracts for safety. Encouragement and support given. Will continue to monitor.

## 2024-06-06 NOTE — Progress Notes (Signed)
   06/06/24 1600  Charting Type  Charting Type Admission  Focused Reassessment No Changes Psychosocial  Focused Reassessment Changes Noted Psychosocial  Safety Check Verification  Has the RN verified the 15 minute safety check completion? Yes  Neurological  Neuro (WDL) X  Orientation Level Oriented X4  Cognition Appropriate attention/concentration;Appropriate at baseline;Follows commands  Speech Clear  Neuro Symptoms Other (Comment) (AVH)  Neuro symptoms relieved by Other (Comment) (unknown at this time)  HEENT  HEENT (WDL) X (eyes do not align)  Teeth Missing (Comment);Poor dental hygiene;Other (Comment) (severe halitosis)  Tongue Pink;Moist  Mucous Membrane(s) Pink;Moist  Voice Clear  Respiratory  Respiratory Pattern Regular;Unlabored;Other (Comment) (hx chronic bronchitis)  Chest Assessment Chest expansion symmetrical  Cough None  Respiratory (WDL) X  Cardiac  Cardiac (WDL) WDL  Vascular  Vascular (WDL) WDL  Integumentary  Integumentary (WDL) X (RUE tattoo)  Skin Color Appropriate for ethnicity  Skin Condition Dry  Skin Integrity Intact  Skin Turgor Non-tenting  Braden Scale (Ages 8 and up)  Sensory Perceptions 4  Moisture 4  Activity 4  Mobility 4  Nutrition 3  Friction and Shear 3  Braden Scale Score 22  Musculoskeletal  Musculoskeletal (WDL) WDL  Gastrointestinal  Gastrointestinal (WDL) WDL  Last BM Date  06/05/24  GU Assessment  Genitourinary (WDL) WDL  Genitalia  Female Genitalia Intact  Neurological  Level of Consciousness Alert

## 2024-06-06 NOTE — ED Notes (Signed)
 Pt observed/assessed in recliner sleeping. RR even and unlabored, appearing in no noted distress. Environmental check complete, will continue to monitor for safety

## 2024-06-06 NOTE — ED Notes (Signed)
 Pt transferred voluntarily via safe transport to [facility]. All paperwork and belongings given to safe transport.

## 2024-06-06 NOTE — Plan of Care (Signed)

## 2024-06-06 NOTE — Progress Notes (Signed)
   06/06/24 1600  Psych Admission Type (Psych Patients Only)  Admission Status Voluntary  Psychosocial Assessment  Patient Complaints Other (Comment) (self-harm thoughts)  Eye Contact Brief  Facial Expression Flat  Affect Flat  Speech Logical/coherent  Interaction Assertive  Motor Activity Slow  Appearance/Hygiene Body odor;Disheveled;Poor hygiene;In scrubs  Behavior Characteristics Cooperative;Appropriate to situation;Calm  Mood Depressed;Pleasant  Aggressive Behavior  Targets Self  Type of Behavior Other (Comment) (pt reported SI)  Effect No apparent injury  Thought Process  Coherency WDL  Content WDL  Delusions None reported or observed  Perception Hallucinations  Hallucination Auditory;Visual  Judgment Impaired  Confusion WDL  Danger to Self  Current suicidal ideation? Plan;Verbalizes  Description of Suicide Plan chews the inside of mouth  Self-Injurious Behavior Some self-injurious ideation observed or expressed.  No lethal plan expressed   Agreement Not to Harm Self Yes  Description of Agreement Verbal  Danger to Others  Danger to Others None reported or observed

## 2024-06-06 NOTE — ED Provider Notes (Addendum)
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 mmol/L Final   Calcium  04/06/2024 9.0  mg/dL Final   Comment:               No patient age and/or gender provided                    or N placed in gender box              Age                Female          Female           0 - 10 days        8.6 - 10.4     8.6 - 10.4     11 days -  1 year        9.2 - 11.0     9.2 - 11.0           2 - 11 years       9.1 - 10.5     9.1 - 10.5  12 - 17 years       8.9 - 10.4     8.9 - 10.4          18 - 59 years       8.7 - 10.2     8.7 - 10.2              >59 years       8.6 - 10.2     8.7 - 10.3    Total Protein 04/06/2024 6.5  6.0 - 8.5 g/dL Final   Albumin 91/81/7974 4.1  g/dL Final   Comment:               No patient age and/or gender provided                    or N placed in gender box               Age                Female          Female           0 -   7 days       3.6 - 4.9      3.6 - 4.9           8 -  30 days       3.5 - 4.6      3.5 - 4.6           1 -   6 months     3.7 - 4.8      3.7 - 4.8    7 months -   2 years      4.0 - 5.0      4.0 - 5.0           3 -   5 years      4.1 - 5.0      4.1 - 5.0           6 -  12 years      4.2 - 5.0      4.2 - 5.0          13 -  30 years      4.3 - 5.2      4.0 - 5.0          31 -  50 years      4.1 - 5.1      3.9 - 4.9          51 -  60 years      3.8 - 4.9      3.8 - 4.9          61 -  70 years      3.9 - 4.9      3.9 - 4.9          71 -  80 years      3.8 - 4.8      3.8 - 4.8          81 -  89 years      3.7 - 4.7      3.7 - 4.7          90 - 199 years      3.6 - 4.6      3.6 - 4.6    Globulin, Total 04/06/2024 2.4  1.5 - 4.5 g/dL Final   Bilirubin  Total 04/06/2024 0.4  0.0 - 1.2 mg/dL Final   Alkaline Phosphatase 04/06/2024 72  44 - 121 IU/L Final   AST 04/06/2024 17  0 - 40 IU/L Final   ALT 04/06/2024 8  IU/L Final   Comment:               No patient age and/or gender provided                    or N placed in gender box              Age                Female          Female           0 - 11 years         0 - 86         0 - 29          12 - 17 years         0 - 30         0 - 24              >17 years         0 - 44         0 - 32    Cholesterol, Total 04/06/2024 130  mg/dL Final   Comment:               No patient age and/or gender provided                    or N placed in gender box              Age                Female          Female           0 - 19 years       100 - 169      100 - 169              >19 years       100 - 199      100 - 199    Triglycerides 04/06/2024 56  mg/dL Final   Comment:               No patient age and/or gender provided                    or N placed in gender box              Age                Female          Female           0 -  9 years         0 -  74        0 -  74          10 - 19  years         0 -  89        0 -  89              >19 years         0 - 149  0 - 149    HDL 04/06/2024 42  >39 mg/dL Final   VLDL Cholesterol Cal 04/06/2024 12  5 - 40 mg/dL Final   LDL Chol Calc (NIH) 04/06/2024 76  mg/dL Final   Comment:               No patient age and/or gender provided                    or N placed in gender box              Age                Female          Female           0 - 19 years         0 - 109        0 - 109              >19 years         0 -  99        0 -  99    Hgb A1c MFr Bld 04/06/2024 5.2  4.8 - 5.6 % Final   Comment:          Prediabetes: 5.7 - 6.4          Diabetes: >6.4          Glycemic control for adults with diabetes: <7.0    specimen status report 04/06/2024 Comment   Final   Comment: Written Authorization Written Authorization Written Authorization Received. Authorization received from [employer] 04-16-2024 Logged by Franky Mae   Orders Only on 04/06/2024  Component Date Value Ref Range Status   WBC 04/06/2024 WILL FOLLOW   Preliminary   RBC 04/06/2024 WILL FOLLOW   Preliminary   Hemoglobin 04/06/2024 WILL FOLLOW   Preliminary   Hematocrit 04/06/2024 WILL FOLLOW   Preliminary   MCV 04/06/2024 WILL FOLLOW   Preliminary   [facility] 04/06/2024 WILL FOLLOW   Preliminary   MCHC 04/06/2024 WILL FOLLOW   Preliminary   RDW 04/06/2024 WILL FOLLOW   Preliminary   Platelets 04/06/2024 WILL FOLLOW   Preliminary   Neutrophils 04/06/2024 WILL FOLLOW   Preliminary   Lymphs 04/06/2024 WILL FOLLOW   Preliminary   Monocytes 04/06/2024 WILL FOLLOW   Preliminary   Eos 04/06/2024 WILL FOLLOW   Preliminary   Basos 04/06/2024 WILL FOLLOW   Preliminary   Neutrophils Absolute 04/06/2024 WILL FOLLOW   Preliminary   Lymphocytes Absolute 04/06/2024 WILL FOLLOW   Preliminary   Monocytes Absolute 04/06/2024 WILL FOLLOW   Preliminary   EOS (ABSOLUTE) 04/06/2024 WILL FOLLOW   Preliminary   Basophils Absolute 04/06/2024 WILL FOLLOW   Preliminary    Immature Granulocytes 04/06/2024 WILL FOLLOW   Preliminary   Immature Grans (Abs) 04/06/2024 WILL FOLLOW   Preliminary   specimen status report 04/06/2024 Comment   Final   Comment: Ambiguous Test Order Ambiguous Test Order Report delayed in order to contact you to clarify requested test(s). Spoke with Arland B at account. Confirmed testing. Added tests to bro-1   Community Documentation on 03/23/2024  Component Date Value Ref Range Status   Glucose Fasting, POC 03/23/2024 107 (A)  70 - 99 mg/dL Final    Allergies: Aspartame and phenylalanine, Benadryl  [diphenhydramine ], Other, Scallops [shellfish allergy], Tegretol [carbamazepine], Yellow jacket venom [bee venom], Pollen extract, Adhesive [tape], and Latex  Medications:  Facility Ordered Medications  Medication   acetaminophen  (TYLENOL ) tablet 650 mg   alum & mag hydroxide-simeth (MAALOX/MYLANTA) 200-200-20 MG/5ML suspension 30 mL   magnesium  hydroxide (MILK OF MAGNESIA) suspension 30 mL   hydrOXYzine  (ATARAX ) tablet 25 mg   traZODone  (DESYREL ) tablet 100 mg   haloperidol lactate (HALDOL) injection 10 mg   And   LORazepam  (ATIVAN ) injection 2 mg   haloperidol lactate (HALDOL) injection 5 mg   And   LORazepam  (ATIVAN ) injection 2 mg   haloperidol (HALDOL) tablet 5 mg   FLUoxetine  (PROZAC ) capsule 40 mg   gabapentin  (NEURONTIN ) capsule 300 mg   paliperidone (INVEGA) 24 hr tablet 3 mg   PTA Medications  Medication Sig   acetaminophen  (TYLENOL ) 500 MG tablet Take 500 mg by mouth every 6 (six) hours as needed (For rheumatoid arthritis and hip dysplasia).   Menthol , Topical Analgesic, (ICY HOT BACK EX) Apply 1 application  topically as needed (For back pain).   Multiple Vitamin (MULTIVITAMIN WITH MINERALS) TABS tablet Take 1 tablet by mouth daily.   ARIPiprazole  (ABILIFY ) 20 MG tablet Take 1 tablet (20 mg total) by mouth daily.   FLUoxetine  (PROZAC ) 40 MG capsule Take 1 capsule (40 mg total) by mouth daily.   gabapentin  (NEURONTIN )  300 MG capsule Take 1 capsule (300 mg total) by mouth at bedtime.   hydrOXYzine  (ATARAX ) 25 MG tablet Take 1 tablet (25 mg total) by mouth daily as needed for anxiety.   Medical Decision Making  -Recommended for inpatient mental health hospitalization for treatment and stabilization of mental status - Baseline labs ordered including hemoglobin A1c, lipid panel, TSH, CMP, CBC, EKG. - Ordered Invega 3 mg twice daily for psychosis and mood stabilization - Hydroxyzine  25 mg 3 times daily as needed for anxiety -Trazodone  50 mg-nightly as needed for sleep -Agitation protocol medications: Ativan /Benadryl Karolee as needed   Recommendations  Based on my evaluation the patient appears to have an emergency mental health condition for which I recommend the patient be transferred to an inpatient behavioral health unit for treatment and stabilization.   Donia Snell, NP 06/06/24  12:43 PM

## 2024-06-06 NOTE — ED Notes (Signed)
 Pt sleeping at this time. Rise and fall of chest noted. Pt in NAD at this time. Will continue to monitor.

## 2024-06-06 NOTE — Group Note (Signed)
 Date:  06/06/2024 Time:  9:58 PM  Group Topic/Focus:  Wrap-Up Group:   The focus of this group is to help patients review their daily goal of treatment and discuss progress on daily workbooks.    Participation Level:  Active  Participation Quality:  Appropriate and Attentive  Affect:  Appropriate  Cognitive:  Appropriate  Insight: Appropriate  Engagement in Group:  Engaged  Modes of Intervention:  Discussion  Additional Comments:     Kerri Katz 06/06/2024, 9:58 PM

## 2024-06-06 NOTE — ED Notes (Signed)
 Report given to Jon RN [facility])

## 2024-06-06 NOTE — Progress Notes (Signed)
 Pt visible but withdrawn with no peers interaction.  Pt continues to verbalize AH - voices telling him to kill himself and this if he does not act on the command, that they (the voices) are coming to do it.    06/06/24 2300  Psych Admission Type (Psych Patients Only)  Admission Status Voluntary  Psychosocial Assessment  Patient Complaints None  Eye Contact Fair  Facial Expression Flat  Affect Appropriate to circumstance  Speech Logical/coherent  Interaction Assertive  Motor Activity Other (Comment) (WDL)  Appearance/Hygiene Disheveled  Behavior Characteristics Appropriate to situation  Mood Depressed  Aggressive Behavior  Effect No apparent injury  Thought Process  Coherency WDL  Content WDL  Delusions None reported or observed  Perception Hallucinations  Hallucination Auditory  Judgment Limited  Confusion WDL  Danger to Self  Current suicidal ideation? Denies  Agreement Not to Harm Self Yes  Description of Agreement Verbal;  Danger to Others  Danger to Others None reported or observed
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
## 2024-06-06 NOTE — Discharge Instructions (Signed)
 Please transfer the patient to the BMU for a higher level of care for treatment and stabilization of her mental status.

## 2024-06-06 NOTE — ED Notes (Signed)
 Pt A&O x4, no distress noted, presents with SI, plano to cut self or overdose on Benadryl .  Pt calm cooperative, comfort measures given.  Contracts for safety.  Monitoring for safety.

## 2024-06-07 DIAGNOSIS — F250 Schizoaffective disorder, bipolar type: Secondary | ICD-10-CM

## 2024-06-07 MED ORDER — FLUOXETINE HCL 20 MG PO CAPS
60.0000 mg | ORAL_CAPSULE | Freq: Every day | ORAL | Status: DC
  Administered 2024-06-08 – 2024-06-12 (×5): 60 mg via ORAL
  Filled 2024-06-07 (×5): qty 3

## 2024-06-07 MED ORDER — FLUOXETINE HCL 20 MG PO CAPS: 60.0000 mg | ORAL_CAPSULE | Freq: Every day | ORAL | Status: DC

## 2024-06-07 NOTE — Plan of Care (Signed)
 ?  Problem: Education: ?Goal: Mental status will improve ?Outcome: Progressing ?Goal: Verbalization of understanding the information provided will improve ?Outcome: Progressing ?  ?

## 2024-06-07 NOTE — Progress Notes (Signed)
   06/07/24 0810  Psych Admission Type (Psych Patients Only)  Admission Status Voluntary  Psychosocial Assessment  Patient Complaints Self-harm thoughts  Eye Contact Fair  Facial Expression Flat  Affect Appropriate to circumstance  Speech Logical/coherent  Interaction Assertive  Motor Activity Other (Comment) (steady)  Appearance/Hygiene Disheveled  Behavior Characteristics Appropriate to situation  Mood Depressed  Aggressive Behavior  Targets Self  Type of Behavior Other (Comment) (endorses SI)  Effect No apparent injury  Thought Process  Coherency WDL  Content WDL  Delusions None reported or observed  Perception Hallucinations  Hallucination Auditory;Visual  Judgment Impaired  Confusion WDL  Danger to Self  Current suicidal ideation? Passive  Agreement Not to Harm Self Yes  Description of Agreement verbal

## 2024-06-07 NOTE — Group Note (Unsigned)
 Date:  06/07/2024 Time:  7:08 PM  Group Topic/Focus:  Conflict Resolution:   The focus of this group is to discuss the conflict resolution process and how it may be used upon discharge.     Participation Level:  {[facility] PARTICIPATION OZCZO:77735}  Participation Quality:  {[facility] PARTICIPATION QUALITY:22265}  Affect:  {[facility] AFFECT:22266}  Cognitive:  {[facility] COGNITIVE:22267}  Insight: {B[facility] Insight2:20797}  Engagement in Group:  {[facility] ENGAGEMENT IN HMNLE:77731}  Modes of Intervention:  {[facility] MODES OF INTERVENTION:22269}  Additional Comments:  ***  [name] 06/07/2024, 7:08 PM

## 2024-06-07 NOTE — [facility] Suicide Risk Assessment (Signed)
 [facility] INPATIENT:  Family/Significant Other Suicide Prevention Education  Suicide Prevention Education:  Contact Attempts: 1st, [name] [phone] has been identified by the patient as the family member/significant other with whom the patient will be residing, and identified as the person(s) who will aid the patient in the event of a mental health crisis.  With written consent from the patient, two attempts were made to provide suicide prevention education, prior to and/or following the patient's discharge.  We were unsuccessful in providing suicide prevention education.  A suicide education pamphlet was given to the patient to share with family/significant other.  Date and time of first attempt:06/07/24 4:59pm Date and time of second attempt:  Amanda [name] 06/07/2024, 5:01 PM

## 2024-06-07 NOTE — Plan of Care (Signed)
  Problem: Education: Goal: Emotional status will improve Outcome: Progressing Goal: Mental status will improve 06/07/2024 [id number] by Zachary Titus BIRCH, RN Outcome: Progressing 06/07/2024 1635 by Zachary Titus BIRCH, RN Outcome: Progressing Goal: Verbalization of understanding the information provided will improve Outcome: Progressing

## 2024-06-07 NOTE — Plan of Care (Signed)
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
## 2024-06-07 NOTE — BHH Suicide Risk Assessment (Signed)
 [facility] Admission Suicide Risk Assessment   Nursing information obtained from:  Patient Demographic factors:  Caucasian, Gay, lesbian, or bisexual orientation, Low socioeconomic status Current Mental Status:  Suicidal ideation indicated by patient Loss Factors:  NA Historical Factors:  Prior suicide attempts Risk Reduction Factors:  Positive social support  Total Time spent with patient: 30 minutes Principal Problem: Schizoaffective disorder, bipolar type (HCC) Diagnosis:  Principal Problem:   Schizoaffective disorder, bipolar type (HCC)  Subjective Data: Per triage: [name] is a 55Y female (uses he/him pronouns) presenting to [facility] vol with a friend. Pt states that he has been seeing shadow figures that are holding bloody knives. Pt states that these figures are telling him to kill yourself or we will. Pt endorses current SI with a plan to overdose on benadryl . Pt denies HI and substance use. Pt has a hx of Anxiety, Bipolar 1, BPD, OCD, Depression, PTSD, and Schizoaffective Disorder Bipolar Type.   Continued Clinical Symptoms:  Alcohol Use Disorder Identification Test Final Score (AUDIT): 0 The Alcohol Use Disorders Identification Test, Guidelines for Use in Primary Care, Second Edition.  World Science writer [facility]). Score between 0-7:  no or low risk or alcohol related problems. Score between 8-15:  moderate risk of alcohol related problems. Score between 16-19:  high risk of alcohol related problems. Score 20 or above:  warrants further diagnostic evaluation for alcohol dependence and treatment.   CLINICAL FACTORS:   Bipolar Disorder:   Bipolar II Schizophrenia:   Command hallucinatons More than one psychiatric diagnosis Previous Psychiatric Diagnoses and Treatments   Musculoskeletal: Strength & Muscle Tone: within normal limits Gait & Station: normal Patient leans: N/A  Psychiatric Specialty Exam:  Presentation  General Appearance:  Disheveled  Eye  Contact: Fair  Speech: Clear and Coherent  Speech Volume: Normal  Handedness: Right   Mood and Affect  Mood: Depressed; Anxious  Affect: Congruent   Thought Process  Thought Processes: Coherent  Descriptions of Associations:Intact  Orientation:Full (Time, Place and Person)  Thought Content:Illogical  History of Schizophrenia/Schizoaffective disorder:No data recorded Duration of Psychotic Symptoms:Greater than six months  Hallucinations:Hallucinations: Visual  Ideas of Reference:Paranoia  Suicidal Thoughts:Suicidal Thoughts: Yes, Active SI Active Intent and/or Plan: With Plan; With Intent  Homicidal Thoughts:Homicidal Thoughts: No   Sensorium  Memory: Immediate Fair  Judgment: Fair  Insight: Fair   Art therapist  Concentration: Fair  Attention Span: Fair  Recall: [employer] of Knowledge: Fair  Language: Fair   Psychomotor Activity  Psychomotor Activity: Psychomotor Activity: Normal   Assets  Assets: Desire for Improvement; Communication Skills   Sleep  Sleep: Sleep: Poor    Physical Exam: Physical Exam ROS Blood pressure 101/69, pulse 62, temperature 98.1 F (36.7 C), temperature source Oral, resp. rate 18, height 5' 7 (1.702 m), weight 72.6 kg, SpO2 95%. Body mass index is 25.06 kg/m.   COGNITIVE FEATURES THAT CONTRIBUTE TO RISK:  None    SUICIDE RISK:   Moderate:  Frequent suicidal ideation with limited intensity, and duration, some specificity in terms of plans, no associated intent, good self-control, limited dysphoria/symptomatology, some risk factors present, and identifiable protective factors, including available and accessible social support.  PLAN OF CARE: inpatient hospitalization and stabilize suicidal thoughts   I certify that inpatient services furnished can reasonably be expected to improve the patient's condition.   Lilyian Quayle B Gracen Southwell, NP 06/07/2024, 8:15 AM

## 2024-06-08 ENCOUNTER — Encounter: Payer: Self-pay | Admitting: Child & Adolescent Psychiatry

## 2024-06-08 DIAGNOSIS — F250 Schizoaffective disorder, bipolar type: Secondary | ICD-10-CM | POA: Diagnosis not present

## 2024-06-08 LAB — PROLACTIN: Prolactin: 14.9 ng/mL (ref 4.8–33.4)

## 2024-06-08 MED ORDER — ADULT MULTIVITAMIN W/MINERALS CH
1.0000 | ORAL_TABLET | Freq: Every day | ORAL | Status: DC
  Administered 2024-06-08 – 2024-06-12 (×5): 1 via ORAL
  Filled 2024-06-08 (×5): qty 1

## 2024-06-08 MED ORDER — LORAZEPAM 1 MG PO TABS: 1.0000 mg | ORAL_TABLET | Freq: Three times a day (TID) | ORAL | Status: DC | PRN

## 2024-06-08 MED ORDER — OLANZAPINE 10 MG IM SOLR: 5.0000 mg | Freq: Three times a day (TID) | INTRAMUSCULAR | Status: DC | PRN

## 2024-06-08 MED ORDER — OLANZAPINE 5 MG PO TABS: 5.0000 mg | ORAL_TABLET | Freq: Three times a day (TID) | ORAL | Status: DC | PRN

## 2024-06-08 NOTE — Progress Notes (Signed)
 NUTRITION ASSESSMENT  Pt identified as at risk on the Malnutrition Screen Tool  INTERVENTION:  -Continue regular diet -Continue Ensure Plus High Protein po BID, each supplement provides 350 kcal and 20 grams of protein  -MVI with minerals daily   NUTRITION DIAGNOSIS: Unintentional weight loss related to sub-optimal intake as evidenced by pt report.   Goal: Pt to meet >/= 90% of their estimated nutrition needs.  Monitor:  PO intake  Assessment:  Patient has PMH of anxiety bipolar 1, BPD, OCD, depression, PTSD, and schizoaffective disorder.   Admitted with schizoaffective disorder.   Patient has been experiencing auditory hallucinations. He had a similar presentation when hospitalized in [place] over a year ago.   He is currently on a regular diet. No meal completion data available to assess at this time. Ensure supplements have been ordered.   Weight has been stable over the past year.   Medications reviewed.   Labs reviewed. Tox screen negative.    43 y.o. adult  Height: Ht Readings from Last 1 Encounters:  06/06/24 5' 7 (1.702 m)    Weight: Wt Readings from Last 1 Encounters:  06/06/24 72.6 kg    Weight Hx: Wt Readings from Last 10 Encounters:  06/06/24 72.6 kg  03/24/24 68.4 kg  10/29/23 72.6 kg  08/06/23 72.3 kg  05/21/23 70.9 kg  02/26/23 73.2 kg  12/04/22 77.6 kg  09/14/22 78.9 kg  12/18/21 87.5 kg  10/02/21 86.1 kg    BMI:  Body mass index is 25.06 kg/m. Pt meets criteria for overweight.  based on current BMI.  Estimated Nutritional Needs: Kcal: 25-30 kcal/kg Protein: > 1 gram protein/kg Fluid: 1 ml/kcal  Diet Order:  Diet Order             Diet regular Room service appropriate? Yes; Fluid consistency: Thin  Diet effective now                  Pt is also offered choice of unit snacks mid-morning and mid-afternoon.  Pt is eating as desired.   Lab results and medications reviewed.   Margery ORN, RD, LDN, CDCES Registered  Dietitian III Certified Diabetes Care and Education Specialist If unable to reach this RD, please use RD Inpatient group chat on secure chat between hours of 8am-4 pm daily

## 2024-06-08 NOTE — Group Note (Signed)
 [facility] LCSW Group Therapy Note   Group Date: 06/08/2024 Start Time: 1300 End Time: 1400   Type of Therapy/Topic:  Group Therapy:  Emotion Regulation  Participation Level:  Active   Mood: Appropriate   Description of Group:    The purpose of this group is to assist patients in learning to regulate negative emotions and experience positive emotions. Patients will be guided to discuss ways in which they have been vulnerable to their negative emotions. These vulnerabilities will be juxtaposed with experiences of positive emotions or situations, and patients challenged to use positive emotions to combat negative ones. Special emphasis will be placed on coping with negative emotions in conflict situations, and patients will process healthy conflict resolution skills.  Therapeutic Goals: Patient will identify two positive emotions or experiences to reflect on in order to balance out negative emotions:  Patient will label two or more emotions that they find the most difficult to experience:  Patient will be able to demonstrate positive conflict resolution skills through discussion or role plays:   Summary of Patient Progress:   During group, patient and group explored the ways in which our thoughts can impact our feelings which impacts our behaviors. Group along with facilitator completed a thermometer activity where different areas of life were explored. Participants were asked to notate in which zone these areas exist in on their personal thermometers. The group then discussed coping skills, and safety plans to help better prepare for potential stressors and learn to better emotionally regulate.      Therapeutic Modalities:   Cognitive Behavioral Therapy Feelings Identification Dialectical Behavioral Therapy   Alveta CHRISTELLA Kerns, LCSW
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
## 2024-06-08 NOTE — [facility] Suicide Risk Assessment (Signed)
 [facility] INPATIENT:  Family/Significant Other Suicide Prevention Education  Suicide Prevention Education:  Contact Attempts: [name], friend, [phone] , (name of family member/significant other) has been identified by the patient as the family member/significant other with whom the patient will be residing, and identified as the person(s) who will aid the patient in the event of a mental health crisis.  With written consent from the patient, two attempts were made to provide suicide prevention education, prior to and/or following the patient's discharge.  We were unsuccessful in providing suicide prevention education.  A suicide education pamphlet was given to the patient to share with family/significant other.  Date and time of first attempt:06/07/24 4:59pm  Date and time of second attempt:06/08/24 at 12:58 PM   Amanda [name] 06/08/2024, 12:57 PM

## 2024-06-08 NOTE — Progress Notes (Signed)
   06/08/24 1700  Psych Admission Type (Psych Patients Only)  Admission Status Voluntary  Psychosocial Assessment  Patient Complaints Depression;Self-harm thoughts;Other (Comment) (auditory and visual hallucinations; patient also reports being tired from starting a new medication.)  Eye Contact Fair;Watchful  Facial Expression Sullen  Affect Appropriate to circumstance  Speech Soft;Logical/coherent  Interaction Assertive  Motor Activity Slow  Appearance/Hygiene Layered clothes;Disheveled;Poor hygiene  Behavior Characteristics Cooperative;Appropriate to situation  Mood Pleasant  Aggressive Behavior  Effect No apparent injury  Thought Process  Coherency WDL  Content WDL  Delusions None reported or observed  Perception Hallucinations  Hallucination Auditory;Visual  Judgment Limited  Confusion None  Danger to Self  Current suicidal ideation? Passive  Self-Injurious Behavior Some self-injurious ideation observed or expressed.  No lethal plan expressed   Agreement Not to Harm Self Yes  Description of Agreement Verbal  Danger to Others  Danger to Others None reported or observed   Patient stated that at his baseline he has hallucinations, but does feel safe in the hospital. Patient also stated that I have my good days and my bad days, regarding her depression; I lost my grandmother twenty-five years ago and I had a twin brother, but he didn't survive the birth.   Patient's goal for today, per his self-inventory is to get back to my baseline, talk to friends.

## 2024-06-08 NOTE — Group Note (Signed)
 Recreation Therapy Group Note   Group Topic:Other  Group Date: 06/08/2024 Start Time: 1445 End Time: 1535 Facilitators: Celestia Jeoffrey BRAVO, LRT, CTRS Location: Craftroom  Activity Description/Intervention: Therapeutic Drumming. Patients with peers and staff were given the opportunity to engage in a leader facilitated [facility] Group Empowerment Drumming Circle with staff from the [employer], in partnership with [employer]. Teaching laboratory technician and trained [employer], [name] Mon leading with LRT observing and documenting intervention and pt response. This evidenced-based practice targets 7 areas of health and wellbeing in the human experience including: stress-reduction, exercise, self-expression, camaraderie/support, nurturing, spirituality, and music-making (leisure).    Goal Area(s) Addresses:  Patient will engage in pro-social way in music group.  Patient will follow directions of drum leader on the first prompt. Patient will demonstrate no behavioral issues during group.  Patient will identify if a reduction in stress level occurs as a result of participation in therapeutic drum circle.   Affect/Mood: Appropriate   Participation Level: Active and Engaged   Participation Quality: Independent   Behavior: Appropriate, Calm, and Cooperative   Speech/Thought Process: Coherent   Insight: Good   Judgement: Good   Modes of Intervention: Music   Patient Response to Interventions:  Attentive, Engaged, and Receptive   Education Outcome:  Acknowledges education   Clinical Observations/Individualized Feedback: [name] was active in their participation of session activities and group discussion.   Plan: Continue to engage patient in RT group sessions 2-3x/week.   Jeoffrey BRAVO Celestia, LRT, CTRS 06/08/2024 5:00 PM

## 2024-06-08 NOTE — [facility] Suicide Risk Assessment (Signed)
 [facility] INPATIENT:  Family/Significant Other Suicide Prevention Education  Suicide Prevention Education:  Education Completed; Amanda [name], friends, [phone] ,  (name of family member/significant other) has been identified by the patient as the family member/significant other with whom the patient will be residing, and identified as the person(s) who will aid the patient in the event of a mental health crisis (suicidal ideations/suicide attempt).  With written consent from the patient, the family member/significant other has been provided the following suicide prevention education, prior to the and/or following the discharge of the patient.  The suicide prevention education provided includes the following: Suicide risk factors Suicide prevention and interventions National Suicide Hotline telephone number [facility] assessment telephone number [facility] Emergency Assistance 911 [facility] and/or Residential Mobile Crisis Unit telephone number  Request made of family/significant other to: Remove weapons (e.g., guns, rifles, knives), all items previously/currently identified as safety concern.   Remove drugs/medications (over-the-counter, prescriptions, illicit drugs), all items previously/currently identified as a safety concern.  The family member/significant other verbalizes understanding of the suicide prevention education information provided.  The family member/significant other agrees to remove the items of safety concern listed above.  Amanda [name] 06/08/2024, 2:58 PM

## 2024-06-08 NOTE — Group Note (Signed)
 Date:  06/08/2024 Time:  7:43 AM  Group Topic/Focus:  Personal Choices and Values:   The focus of this group is to help patients assess and explore the importance of values in their lives, how their values affect their decisions, how they express their values and what opposes their expression. Self Care:   The focus of this group is to help patients understand the importance of self-care in order to improve or restore emotional, physical, spiritual, interpersonal, and financial health. Self Esteem Action Plan:   The focus of this group is to help patients create a plan to continue to build self-esteem after discharge. Wrap-Up Group:   The focus of this group is to help patients review their daily goal of treatment and discuss progress on daily workbooks.    Participation Level:  Active  Participation Quality:  Appropriate and Attentive  Affect:  Appropriate  Cognitive:  Alert, Appropriate, and Oriented  Insight: Appropriate and Good  Engagement in Group:  Engaged  Modes of Intervention:  Discussion and Support  Additional Comments:  N/A  Butler LITTIE Gelineau 06/08/2024, 7:43 AM

## 2024-06-08 NOTE — Plan of Care (Signed)

## 2024-06-08 NOTE — Group Note (Signed)
 Date:  06/08/2024 Time:  10:07 AM  Group Topic/Focus:  Goals Group:   The focus of this group is to help patients establish daily goals to achieve during treatment and discuss how the patient can incorporate goal setting into their daily lives to aide in recovery.    Participation Level:  Did Not Attend   Amanda [name] Amanda [name] 06/08/2024, 10:07 AM

## 2024-06-08 NOTE — Group Note (Signed)
 Date:  06/08/2024 Time:  10:52 PM  Group Topic/Focus:  Self Care:   The focus of this group is to help patients understand the importance of self-care in order to improve or restore emotional, physical, spiritual, interpersonal, and financial health.    Participation Level:  Active  Participation Quality:  Appropriate  Affect:  Appropriate  Cognitive:  Appropriate  Insight: Appropriate  Engagement in Group:  Engaged  Modes of Intervention:  Discussion  Additional Comments:    Janeka Libman L 06/08/2024, 10:52 PM

## 2024-06-08 NOTE — Group Note (Signed)
 Recreation Therapy Group Note   Group Topic:General Recreation  Group Date: 06/08/2024 Start Time: 1055 End Time: 1135 Facilitators: Celestia Jeoffrey BRAVO, LRT, CTRS Location: Courtyard  Group Description: [employer]. LRT and patients played games of basketball, drew with chalk, and played corn hole while outside in the courtyard while getting fresh air and sunlight. Music was being played in the background. LRT and peers conversed about different games they have played before, what they do in their free time and anything else that is on their minds. LRT encouraged pts to drink water after being outside, sweating and getting their heart rate up.  Goal Area(s) Addressed: Patient will build on frustration tolerance skills. Patients will partake in a competitive play game with peers. Patients will gain knowledge of new leisure interest/hobby.    Affect/Mood: Appropriate   Participation Level: Active and Engaged   Participation Quality: Independent   Behavior: Calm and Cooperative   Speech/Thought Process: Coherent   Insight: Fair   Judgement: Fair    Modes of Intervention: Exploration and Socialization   Patient Response to Interventions:  Attentive and Receptive   Education Outcome:  In group clarification offered    Clinical Observations/Individualized Feedback: Amanda [name] was active in their participation of session activities and group discussion. Pt interacted well with LRT and peers duration of session.    Plan: Continue to engage patient in RT group sessions 2-3x/week.   Jeoffrey BRAVO Celestia, LRT, CTRS 06/08/2024 1:01 PM

## 2024-06-08 NOTE — Progress Notes (Signed)
 [facility] MD Progress Note  06/08/2024 1:34 PM Amanda [name]  MRN:  [id number]   Subjective:  Chart reviewed, case discussed in multidisciplinary meeting, patient seen during rounds.   On interview today, patient is calm and cooperative, alert and oriented.  He endorses visual and auditory hallucinations, stating he can see shadow people with glowing green eyes holding bloody knives in the room.  He states that shadow people tell him to hurt himself but he does not have any intent to act on this.  He is able to contract for safety.  He notes auditory and visual hallucinations have improved since recently starting Invega 3 mg twice daily.  Patient also taking Prozac  60 mg once daily.  Patient reports he is tolerating current medication regimen well without adverse effects.  He denies current SI/plan, notes last episode of SI was yesterday.  He denies HI/plan.  Patient does endorse auditory and visual hallucinations at baseline but notes they are typically not distressing as they are now.   Sleep: Fair  Appetite:  Fair  Past Psychiatric History: see h&P Family History:  Family History  Problem Relation Age of Onset   Heart attack Mother    Stroke Mother    Liver cancer Mother    Diabetes Mother    Lung cancer Mother    Alcoholism Father    Sleep apnea Brother    Depression Brother    ADD / ADHD Brother    Diabetes Maternal Aunt    Diabetes Paternal Uncle    Colon cancer Paternal Uncle    Diabetes Maternal Grandmother    Dementia Maternal Grandmother    Social History:  Social History   Substance and Sexual Activity  Alcohol Use Not Currently   Comment: quit 10 yrs ago ETOH and street drugs     Social History   Substance and Sexual Activity  Drug Use Not Currently   Types: Marijuana   Comment: current use of cannabis; history of heavy illicit drug use > 9 years ago    Social History   Socioeconomic History   Marital status: Single    Spouse name: Not on file   Number  of children: 1   Years of education: 12   Highest education level: 12th grade  Occupational History   Not on file  Tobacco Use   Smoking status: Every Day    Current packs/day: 1.00    Average packs/day: 1 pack/day for 26.0 years (26.0 ttl pk-yrs)    Types: Cigarettes, E-cigarettes    Passive exposure: Past   Smokeless tobacco: Never  Vaping Use   Vaping status: Former  Substance and Sexual Activity   Alcohol use: Not Currently    Comment: quit 10 yrs ago ETOH and street drugs   Drug use: Not Currently    Types: Marijuana    Comment: current use of cannabis; history of heavy illicit drug use > 9 years ago   Sexual activity: Not Currently    Birth control/protection: None  Other Topics Concern   Not on file  Social History Narrative   Currently homeless; living with partner and dog   Right handed   Social Drivers of Health   Financial Resource Strain: Low Risk  (11/05/2023)   Overall Financial Resource Strain (CARDIA)    Difficulty of Paying Living Expenses: Not hard at all  Food Insecurity: Food Insecurity Present (06/06/2024)   Hunger Vital Sign    Worried About Running Out of Food in the Last Year: Sometimes true  Ran Out of Food in the Last Year: Sometimes true  Transportation Needs: No Transportation Needs (06/06/2024)   PRAPARE - Administrator, Civil Service (Medical): No    Lack of Transportation (Non-Medical): No  Physical Activity: Insufficiently Active (11/05/2023)   Exercise Vital Sign    Days of Exercise per Week: 2 days    Minutes of Exercise per Session: 60 min  Stress: No Stress Concern Present (11/05/2023)   [employer] of Occupational Health - Occupational Stress Questionnaire    Feeling of Stress : Not at all  Social Connections: Moderately Isolated (04/17/2024)   Social Connection and Isolation Panel    Frequency of Communication with Friends and Family: More than three times a week    Frequency of Social Gatherings with Friends  and Family: Twice a week    Attends Religious Services: Never    Database administrator or Organizations: Yes    Attends Engineer, structural: Not on file    Marital Status: Divorced   Past Medical History:  Past Medical History:  Diagnosis Date   Anxiety    Asthma    Asthma due to environmental allergies    Asthma due to seasonal allergies    Bipolar 1 disorder (HCC)    Borderline personality disorder (HCC)    Brain bleed (HCC)    Chronic post-traumatic stress disorder (PTSD)    complex chronic with psychotic features and self harm behaviors   Complication of anesthesia    Constipation    Dander (animal) allergy    Hearing loss    right ear   Heart murmur    denies seeing a cardiologist   Hip dysplasia    Hypertension    Gestional    Major depression, chronic    Mood disorder    OCD (obsessive compulsive disorder)    Pneumonia    PONV (postoperative nausea and vomiting)    PTSD (post-traumatic stress disorder)    RA (rheumatoid arthritis) (HCC)    Rheumatoid arthritis (HCC)    Schizophrenia (HCC)    I think that is wrong   Seizure disorder (HCC)    Suicidal ideations    Suicide attempt [facility])     Past Surgical History:  Procedure Laterality Date   Brain Shunt  1981   a few hours old   EYE SURGERY Bilateral 1985   INTRAUTERINE DEVICE (IUD) INSERTION N/A 09/16/2018   Procedure: INTRAUTERINE DEVICE (IUD) INSERTION;  Surgeon: Corene Coy, MD;  Location: [facility];  Service: Gynecology;  Laterality: N/A;   PERINEUM REPAIR N/A 09/16/2018   Procedure: EPISIOTOMY REVISION;  Surgeon: Corene Coy, MD;  Location: [facility];  Service: Gynecology;  Laterality: N/A;   SHUNT REMOVAL  1983   Tooth Removal     multiple    Current Medications: Current Facility-Administered Medications  Medication Dose Route Frequency Provider Last Rate Last Admin   acetaminophen  (TYLENOL ) tablet 650 mg  650 mg Oral Q6H PRN Nkwenti, Doris, NP       alum & mag  hydroxide-simeth (MAALOX/MYLANTA) 200-200-20 MG/5ML suspension 30 mL  30 mL Oral Q4H PRN Nkwenti, Doris, NP       feeding supplement (ENSURE PLUS HIGH PROTEIN) liquid 237 mL  237 mL Oral BID BM Hampton, Tracie B, NP   237 mL at 06/08/24 1100   FLUoxetine  (PROZAC ) capsule 60 mg  60 mg Oral Daily Hampton, Tracie B, NP   60 mg at 06/08/24 0811   gabapentin  (NEURONTIN ) capsule 300 mg  300 mg Oral QHS Tex Drilling, NP   300 mg at 06/07/24 2111   hydrOXYzine  (ATARAX ) tablet 25 mg  25 mg Oral TID PRN Tex Drilling, NP   25 mg at 06/06/24 2108   magnesium  hydroxide (MILK OF MAGNESIA) suspension 30 mL  30 mL Oral Daily PRN Tex Drilling, NP       multivitamin with minerals tablet 1 tablet  1 tablet Oral Daily Madaram, Kondal R, MD   1 tablet at 06/08/24 9185   nicotine  (NICODERM CQ  - dosed in mg/24 hours) patch 14 mg  14 mg Transdermal Daily Hampton, Tracie B, NP   14 mg at 06/08/24 9188   paliperidone (INVEGA) 24 hr tablet 3 mg  3 mg Oral BID Nkwenti, Doris, NP   3 mg at 06/08/24 9188    Lab Results: No results found for this or any previous visit (from the past 48 hours).  Blood Alcohol level:  Lab Results  Component Value Date   [facility] <15 06/05/2024   ETH <10 04/03/2022    Metabolic Disorder Labs: Lab Results  Component Value Date   HGBA1C 4.7 (L) 06/05/2024   MPG 88.19 06/05/2024   MPG 96.8 04/03/2022   Lab Results  Component Value Date   PROLACTIN 14.9 06/05/2024   PROLACTIN 15.3 11/03/2021   Lab Results  Component Value Date   CHOL 158 06/05/2024   TRIG 54 06/05/2024   HDL 44 06/05/2024   CHOLHDL 3.6 06/05/2024   VLDL 11 06/05/2024   LDLCALC 103 (H) 06/05/2024   LDLCALC 76 04/06/2024    Physical Findings: AIMS:  , ,  ,  ,    CIWA:    COWS:      Psychiatric Specialty Exam:  Presentation  General Appearance:  Casual  Eye Contact: Good  Speech: Clear and Coherent  Speech Volume: Normal    Mood and Affect   Mood: Euthymic  Affect: Congruent   Thought Process  Thought Processes: Coherent  Descriptions of Associations:Intact  Orientation:Full (Time, Place and Person)  Thought Content:Illogical  Hallucinations: Auditory and visual Ideas of Reference:Paranoia  Suicidal Thoughts: No Homicidal Thoughts: No  Sensorium  Memory: Immediate Fair  Judgment: Fair  Insight: Fair   Art therapist  Concentration: Fair  Attention Span: Fair  Recall: [employer] of Knowledge: Fair  Language: Fair   Psychomotor Activity  Psychomotor Activity: Normal Musculoskeletal: Strength & Muscle Tone: within normal limits Gait & Station: normal Assets  Assets: Desire for Improvement; Communication Skills    Physical Exam: Physical Exam ROS Blood pressure 116/78, pulse 69, temperature 98 F (36.7 C), temperature source Oral, resp. rate 20, height 5' 7 (1.702 m), weight 72.6 kg, SpO2 96%. Body mass index is 25.06 kg/m.  Diagnosis: Principal Problem:   Schizoaffective disorder, bipolar type (HCC)   PLAN: Safety and Monitoring:  -- Voluntary admission to inpatient psychiatric unit for safety, stabilization and treatment  -- Daily contact with patient to assess and evaluate symptoms and progress in treatment  -- Patient's case to be discussed in multi-disciplinary team meeting  -- Observation Level : q15 minute checks  -- Vital signs:  q12 hours  -- Precautions: suicide, elopement, and assault -- Encouraged patient to participate in unit milieu and in scheduled group therapies  2. Psychiatric Diagnoses and Treatment:  Schizoaffective Bipolar Type--Invega was recently started and will continue current dose Depression/OCD-- continue fluoxetine  60 mg               -- The risks/benefits/side-effects/alternatives to this  medication were discussed in detail with the patient and time was given for questions. The patient consents to medication trial.                --  Metabolic profile and EKG monitoring obtained while on an atypical antipsychotic (BMI: Lipid Panel: HbgA1c: QTc:)              -- Encouraged patient to participate in unit milieu and in scheduled group therapies    Hospital Course:     3. Medical Issues Being Addressed:  No acute medical issues identified at this time   4. Discharge Planning:   -- Social work and case management to assist with discharge planning and identification of hospital follow-up needs prior to discharge  -- Estimated LOS: 5-7 days  [employer], PA-C 06/08/2024, 1:34 PM
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
## 2024-06-09 DIAGNOSIS — F250 Schizoaffective disorder, bipolar type: Secondary | ICD-10-CM | POA: Diagnosis not present

## 2024-06-09 MED ORDER — PALIPERIDONE ER 3 MG PO TB24
6.0000 mg | ORAL_TABLET | Freq: Every day | ORAL | Status: AC
  Administered 2024-06-09: 6 mg via ORAL
  Filled 2024-06-09: qty 2

## 2024-06-09 MED ORDER — PALIPERIDONE ER 3 MG PO TB24
9.0000 mg | ORAL_TABLET | Freq: Every day | ORAL | Status: DC
  Administered 2024-06-10 – 2024-06-11 (×2): 9 mg via ORAL
  Filled 2024-06-09 (×2): qty 3

## 2024-06-09 NOTE — Plan of Care (Signed)

## 2024-06-09 NOTE — Progress Notes (Signed)
 [facility] MD Progress Note  06/09/2024 1:49 PM Amanda [name]  MRN:  [id number]   Subjective:  Chart reviewed, case discussed in multidisciplinary meeting, patient seen during rounds.   10/21: On interview today, patient is found cleaning up her room.  She is alert and oriented, calm and cooperative.  She reports some improvement in auditory and visual hallucinations, but notes these are still present.  She describes continuing to see shadow people and blood all over the room.  She is not observed responding to internal stimuli.  She denies SI/HI/plan.  She states she feels safe on the unit.  She reports stable sleep and appetite.  She denies current symptoms of depression or anxiety.  She reports tolerating current medication regimen well without adverse effects.  Patient is agreeable to Invega dose increase.  10/20: On interview today, patient is calm and cooperative, alert and oriented.  He endorses visual and auditory hallucinations, stating he can see shadow people with glowing green eyes holding bloody knives in the room.  He states that shadow people tell him to hurt himself but he does not have any intent to act on this.  He is able to contract for safety.  He notes auditory and visual hallucinations have improved since recently starting Invega 3 mg twice daily.  Patient also taking Prozac  60 mg once daily.  Patient reports he is tolerating current medication regimen well without adverse effects.  He denies current SI/plan, notes last episode of SI was yesterday.  He denies HI/plan.  Patient does endorse auditory and visual hallucinations at baseline but notes they are typically not distressing as they are now.   Sleep: Fair  Appetite:  Fair  Past Psychiatric History: see h&P Family History:  Family History  Problem Relation Age of Onset   Heart attack Mother    Stroke Mother    Liver cancer Mother    Diabetes Mother    Lung cancer Mother    Alcoholism Father    Sleep apnea Brother     Depression Brother    ADD / ADHD Brother    Diabetes Maternal Aunt    Diabetes Paternal Uncle    Colon cancer Paternal Uncle    Diabetes Maternal Grandmother    Dementia Maternal Grandmother    Social History:  Social History   Substance and Sexual Activity  Alcohol Use Not Currently   Comment: quit 10 yrs ago ETOH and street drugs     Social History   Substance and Sexual Activity  Drug Use Not Currently   Types: Marijuana   Comment: current use of cannabis; history of heavy illicit drug use > 9 years ago    Social History   Socioeconomic History   Marital status: Single    Spouse name: Not on file   Number of children: 1   Years of education: 12   Highest education level: 12th grade  Occupational History   Not on file  Tobacco Use   Smoking status: Every Day    Current packs/day: 1.00    Average packs/day: 1 pack/day for 26.0 years (26.0 ttl pk-yrs)    Types: Cigarettes, E-cigarettes    Passive exposure: Past   Smokeless tobacco: Never  Vaping Use   Vaping status: Former  Substance and Sexual Activity   Alcohol use: Not Currently    Comment: quit 10 yrs ago ETOH and street drugs   Drug use: Not Currently    Types: Marijuana    Comment: current use of cannabis;  history of heavy illicit drug use > 9 years ago   Sexual activity: Not Currently    Birth control/protection: None  Other Topics Concern   Not on file  Social History Narrative   Currently homeless; living with partner and dog   Right handed   Social Drivers of Health   Financial Resource Strain: Low Risk  (11/05/2023)   Overall Financial Resource Strain (CARDIA)    Difficulty of Paying Living Expenses: Not hard at all  Food Insecurity: Food Insecurity Present (06/06/2024)   Hunger Vital Sign    Worried About Running Out of Food in the Last Year: Sometimes true    Ran Out of Food in the Last Year: Sometimes true  Transportation Needs: No Transportation Needs (06/06/2024)   PRAPARE -  Administrator, Civil Service (Medical): No    Lack of Transportation (Non-Medical): No  Physical Activity: Insufficiently Active (11/05/2023)   Exercise Vital Sign    Days of Exercise per Week: 2 days    Minutes of Exercise per Session: 60 min  Stress: No Stress Concern Present (11/05/2023)   [employer] of Occupational Health - Occupational Stress Questionnaire    Feeling of Stress : Not at all  Social Connections: Moderately Isolated (04/17/2024)   Social Connection and Isolation Panel    Frequency of Communication with Friends and Family: More than three times a week    Frequency of Social Gatherings with Friends and Family: Twice a week    Attends Religious Services: Never    Database administrator or Organizations: Yes    Attends Engineer, structural: Not on file    Marital Status: Divorced   Past Medical History:  Past Medical History:  Diagnosis Date   Anxiety    Asthma    Asthma due to environmental allergies    Asthma due to seasonal allergies    Bipolar 1 disorder (HCC)    Borderline personality disorder (HCC)    Brain bleed (HCC)    Chronic post-traumatic stress disorder (PTSD)    complex chronic with psychotic features and self harm behaviors   Complication of anesthesia    Constipation    Dander (animal) allergy    Hearing loss    right ear   Heart murmur    denies seeing a cardiologist   Hip dysplasia    Hypertension    Gestional    Major depression, chronic    Mood disorder    OCD (obsessive compulsive disorder)    Pneumonia    PONV (postoperative nausea and vomiting)    PTSD (post-traumatic stress disorder)    RA (rheumatoid arthritis) (HCC)    Rheumatoid arthritis (HCC)    Schizophrenia (HCC)    I think that is wrong   Seizure disorder (HCC)    Suicidal ideations    Suicide attempt [facility])     Past Surgical History:  Procedure Laterality Date   Brain Shunt  1981   a few hours old   EYE SURGERY Bilateral 1985    INTRAUTERINE DEVICE (IUD) INSERTION N/A 09/16/2018   Procedure: INTRAUTERINE DEVICE (IUD) INSERTION;  Surgeon: Corene Coy, MD;  Location: [facility];  Service: Gynecology;  Laterality: N/A;   PERINEUM REPAIR N/A 09/16/2018   Procedure: EPISIOTOMY REVISION;  Surgeon: Corene Coy, MD;  Location: [facility];  Service: Gynecology;  Laterality: N/A;   SHUNT REMOVAL  1983   Tooth Removal     multiple    Current Medications: Current Facility-Administered Medications  Medication  Dose Route Frequency Provider Last Rate Last Admin   acetaminophen  (TYLENOL ) tablet 650 mg  650 mg Oral Q6H PRN Nkwenti, Doris, NP       alum & mag hydroxide-simeth (MAALOX/MYLANTA) 200-200-20 MG/5ML suspension 30 mL  30 mL Oral Q4H PRN Nkwenti, Doris, NP       feeding supplement (ENSURE PLUS HIGH PROTEIN) liquid 237 mL  237 mL Oral BID BM Hampton, Tracie B, NP   237 mL at 06/09/24 1019   FLUoxetine  (PROZAC ) capsule 60 mg  60 mg Oral Daily Hampton, Tracie B, NP   60 mg at 06/09/24 9160   gabapentin  (NEURONTIN ) capsule 300 mg  300 mg Oral QHS Tex Drilling, NP   300 mg at 06/08/24 2103   hydrOXYzine  (ATARAX ) tablet 25 mg  25 mg Oral TID PRN Tex Drilling, NP   25 mg at 06/08/24 2103   LORazepam  (ATIVAN ) tablet 1 mg  1 mg Oral Q8H PRN Tomy Khim L, PA-C       magnesium  hydroxide (MILK OF MAGNESIA) suspension 30 mL  30 mL Oral Daily PRN Tex Drilling, NP       multivitamin with minerals tablet 1 tablet  1 tablet Oral Daily Madaram, Kondal R, MD   1 tablet at 06/09/24 0840   nicotine  (NICODERM CQ  - dosed in mg/24 hours) patch 14 mg  14 mg Transdermal Daily Hampton, Tracie B, NP   14 mg at 06/09/24 0839   OLANZapine (ZYPREXA) tablet 5 mg  5 mg Oral Q8H PRN Kaija Kovacevic L, PA-C       Or   OLANZapine (ZYPREXA) injection 5 mg  5 mg Intramuscular Q8H PRN Asiana Benninger L, PA-C       paliperidone (INVEGA) 24 hr tablet 3 mg  3 mg Oral BID Nkwenti, Doris, NP   3 mg at 06/09/24 0840    Lab Results: No  results found for this or any previous visit (from the past 48 hours).  Blood Alcohol level:  Lab Results  Component Value Date   [facility] <15 06/05/2024   ETH <10 04/03/2022    Metabolic Disorder Labs: Lab Results  Component Value Date   HGBA1C 4.7 (L) 06/05/2024   MPG 88.19 06/05/2024   MPG 96.8 04/03/2022   Lab Results  Component Value Date   PROLACTIN 14.9 06/05/2024   PROLACTIN 15.3 11/03/2021   Lab Results  Component Value Date   CHOL 158 06/05/2024   TRIG 54 06/05/2024   HDL 44 06/05/2024   CHOLHDL 3.6 06/05/2024   VLDL 11 06/05/2024   LDLCALC 103 (H) 06/05/2024   LDLCALC 76 04/06/2024    Physical Findings: AIMS:  , ,  ,  ,    CIWA:    COWS:      Psychiatric Specialty Exam:  Presentation  General Appearance:  Casual  Eye Contact: Good  Speech: Clear and Coherent  Speech Volume: Normal    Mood and Affect  Mood: Euthymic  Affect: Congruent   Thought Process  Thought Processes: Coherent  Descriptions of Associations:Intact  Orientation:Full (Time, Place and Person)  Thought Content:Illogical  Hallucinations: Auditory and visual  Ideas of Reference:Paranoia  Suicidal Thoughts: No Homicidal Thoughts: No  Sensorium  Memory: Immediate Fair  Judgment: Fair  Insight: Fair   Chartered certified accountant: Fair  Attention Span: Fair  Recall: [employer] of Knowledge: Fair  Language: Fair   Psychomotor Activity  Psychomotor Activity: Normal Musculoskeletal: Strength & Muscle Tone: within normal limits Gait & Station: normal Assets  Assets: Desire for Improvement; Communication Skills    Physical Exam: Physical Exam ROS Blood pressure 112/74, pulse 77, temperature 97.9 F (36.6 C), temperature source Oral, resp. rate 17, height 5' 7 (1.702 m), weight 72.6 kg, SpO2 97%. Body mass index is 25.06 kg/m.  Diagnosis: Principal Problem:   Schizoaffective disorder, bipolar type (HCC)   PLAN: Safety  and Monitoring:  -- Voluntary admission to inpatient psychiatric unit for safety, stabilization and treatment  -- Daily contact with patient to assess and evaluate symptoms and progress in treatment  -- Patient's case to be discussed in multi-disciplinary team meeting  -- Observation Level : q15 minute checks  -- Vital signs:  q12 hours  -- Precautions: suicide, elopement, and assault -- Encouraged patient to participate in unit milieu and in scheduled group therapies   2. Psychiatric Diagnoses and Treatment:  Schizoaffective Bipolar Type Increase Invega to 9 mg at bedtime -as patient already had 3 mg dose today will add 6 mg tonight and start 9 mg nightly tomorrow Depression/OCD-- continue fluoxetine  60 mg             -- The risks/benefits/side-effects/alternatives to this medication were discussed in detail with the patient and time was given for questions. The patient consents to medication trial.                -- Metabolic profile and EKG monitoring obtained while on an atypical antipsychotic (BMI: Lipid Panel: HbgA1c: QTc:)              -- Encouraged patient to participate in unit milieu and in scheduled group therapies    Hospital Course:     3. Medical Issues Being Addressed:  No acute medical issues identified at this time   4. Discharge Planning:   -- Social work and case management to assist with discharge planning and identification of hospital follow-up needs prior to discharge  -- Estimated LOS: 5-7 days  Camelia LITTIE Lukes, PA-C 06/09/2024, 1:49 PM

## 2024-06-09 NOTE — Progress Notes (Signed)
   06/09/24 1400  Psych Admission Type (Psych Patients Only)  Admission Status Voluntary  Psychosocial Assessment  Patient Complaints Anxiety;Depression;None (per self-inventory, but patient did not go into detail as to why he's feeling this way.)  Eye Contact Fair;Watchful  Facial Expression Sullen  Affect Appropriate to circumstance  Speech Soft;Logical/coherent  Interaction Assertive  Motor Activity Slow  Appearance/Hygiene Layered clothes;Disheveled;Poor hygiene  Behavior Characteristics Cooperative  Mood Pleasant  Aggressive Behavior  Effect No apparent injury  Thought Process  Coherency WDL  Content WDL  Delusions None reported or observed  Perception Hallucinations  Hallucination Visual (reported seeing blood everywhere and shadow figures.)  Judgment Limited  Confusion None  Danger to Self  Current suicidal ideation? Passive  Self-Injurious Behavior Some self-injurious ideation observed or expressed.  No lethal plan expressed   Agreement Not to Harm Self Yes  Description of Agreement Verbal  Danger to Others  Danger to Others None reported or observed   Patient's goal for today, per his self-inventory is to write, talk to my friends.

## 2024-06-09 NOTE — Group Note (Signed)
 Date:  06/09/2024 Time:  9:24 PM  Group Topic/Focus:  Wrap-Up Group:   The focus of this group is to help patients review their daily goal of treatment and discuss progress on daily workbooks.    Participation Level:  Active  Participation Quality:  Appropriate and Attentive  Affect:  Appropriate  Cognitive:  Alert and Appropriate  Insight: Appropriate and Good  Engagement in Group:  Engaged  Modes of Intervention:  Discussion  Additional Comments:     Arlester CHRISTELLA Servant 06/09/2024, 9:24 PM

## 2024-06-09 NOTE — Progress Notes (Signed)
 Pt is visible in the milieu but has limited or no peer interaction; withdrawn. C/o being tired and I believes it is the medications I am given; I think the dose is too high. I am having dry mouth. Continues to endorse AH but frequency and duration is decreasing. Voices states, If I don't kill myself, they will.  Pt denied plan or intent to act on the command of the voices. Pt attended and participated in wrap up group.  Took all scheduled HS medications.   06/08/24 2000  Psych Admission Type (Psych Patients Only)  Admission Status Voluntary  Psychosocial Assessment  Patient Complaints Depression  Eye Contact Watchful  Facial Expression Flat  Affect Appropriate to circumstance  Speech Soft;Logical/coherent  Interaction Assertive  Motor Activity Slow  Appearance/Hygiene Disheveled  Behavior Characteristics Appropriate to situation;Cooperative  Mood Pleasant  Aggressive Behavior  Effect No apparent injury  Thought Process  Coherency WDL  Content WDL  Delusions None reported or observed  Perception Hallucinations  Hallucination Auditory  Judgment Limited  Confusion None  Danger to Self  Current suicidal ideation? Passive  Agreement Not to Harm Self Yes  Description of Agreement Verbal  Danger to Others  Danger to Others None reported or observed

## 2024-06-09 NOTE — Group Note (Signed)
 [facility] LCSW Group Therapy Note   Group Date: 06/09/2024 Start Time: 1300 End Time: 1340  Type of Therapy/Topic:  Group Therapy:  Feelings about Diagnosis  Participation Level:  Active    Description of Group:    This group will allow patients to explore their thoughts and feelings about diagnoses they have received. Patients will be guided to explore their level of understanding and acceptance of these diagnoses. Facilitator will encourage patients to process their thoughts and feelings about the reactions of others to their diagnosis, and will guide patients in identifying ways to discuss their diagnosis with significant others in their lives. This group will be process-oriented, with patients participating in exploration of their own experiences as well as giving and receiving support and challenge from other group members.   Therapeutic Goals: 1. Patient will demonstrate understanding of diagnosis as evidence by identifying two or more symptoms of the disorder:  2. Patient will be able to express two feelings regarding the diagnosis 3. Patient will demonstrate ability to communicate their needs through discussion and/or role plays  Summary of Patient Progress: Patient was present for the entirety of the group process. He was engaged with the material. Pt shared that his family does not tell him important/stressful information (like death of a loved one) at times because they don't feel that he will be able to handle it because of his mental health diagnosis. He expressed that he wish that they would let him know things and he could work with is therapist and psychiatrist to assist with safety planning. Pt appeared to be open and receptive to feedback/comments from both her peers and the facilitator.    Therapeutic Modalities:   Cognitive Behavioral Therapy Brief Therapy Feelings Identification    Nadara JONELLE Fam, LCSW

## 2024-06-09 NOTE — Plan of Care (Signed)
   Problem: Education: Goal: Knowledge of Amanda [name] General Education information/materials will improve Outcome: Progressing Goal: Emotional status will improve Outcome: Progressing Goal: Mental status will improve Outcome: Progressing Goal: Verbalization of understanding the information provided will improve Outcome: Progressing   Problem: Activity: Goal: Interest or engagement in activities will improve Outcome: Progressing Goal: Sleeping patterns will improve Outcome: Progressing   Problem: Coping: Goal: Ability to verbalize frustrations and anger appropriately will improve Outcome: Progressing Goal: Ability to demonstrate self-control will improve Outcome: Progressing

## 2024-06-09 NOTE — Group Note (Signed)
 Recreation Therapy Group Note   Group Topic:Health and Wellness  Group Date: 06/09/2024 Start Time: 1550 End Time: 1650 Facilitators: Celestia Jeoffrey BRAVO, LRT, CTRS Location: Courtyard  Group Description: [employer]. LRT and patients played games of basketball, drew with chalk, and played corn hole while outside in the courtyard while getting fresh air and sunlight. Music was being played in the background. LRT and peers conversed about different games they have played before, what they do in their free time and anything else that is on their minds. LRT encouraged pts to drink water after being outside, sweating and getting their heart rate up.  Goal Area(s) Addressed: Patient will build on frustration tolerance skills. Patients will partake in a competitive play game with peers. Patients will gain knowledge of new leisure interest/hobby.    Affect/Mood: Appropriate and Flat   Participation Level: Active   Participation Quality: Independent   Behavior: Appropriate   Speech/Thought Process: Coherent   Insight: Fair   Judgement: Fair    Modes of Intervention: Activity   Patient Response to Interventions:  Receptive   Education Outcome:  Acknowledges education   Clinical Observations/Individualized Feedback: [name] was active in their participation of session activities and group discussion. Pt interacted well with LRT and peers duration of session.    Plan: Continue to engage patient in RT group sessions 2-3x/week.   Jeoffrey BRAVO Celestia, LRT, CTRS 06/09/2024 5:21 PM

## 2024-06-09 NOTE — Progress Notes (Signed)
   06/09/24 2000  Psych Admission Type (Psych Patients Only)  Admission Status Voluntary  Psychosocial Assessment  Patient Complaints Anxiety;Depression  Eye Contact Fair  Facial Expression Flat  Affect Flat  Speech Soft  Interaction Assertive  Motor Activity Slow  Appearance/Hygiene Disheveled;Layered clothes  Behavior Characteristics Cooperative  Mood Pleasant  Thought Process  Coherency WDL  Content WDL  Delusions None reported or observed  Perception Hallucinations  Hallucination Auditory  Judgment Limited  Confusion None  Danger to Self  Current suicidal ideation? Passive  Self-Injurious Behavior Some self-injurious ideation observed or expressed.  No lethal plan expressed   Agreement Not to Harm Self Yes  Description of Agreement Verbal  Danger to Others  Danger to Others None reported or observed

## 2024-06-09 NOTE — Group Note (Signed)
 Recreation Therapy Group Note   Group Topic:Goal Setting  Group Date: 06/09/2024 Start Time: 1000 End Time: 1105 Facilitators: Celestia Jeoffrey BRAVO, LRT, CTRS Location: Craft Room  Group Description: Product/process development scientist. Patients were given many different magazines, a glue stick, markers, and a piece of cardstock paper. LRT and pts discussed the importance of having goals in life. LRT and pts discussed the difference between short-term and long-term goals, as well as what a SMART goal is. LRT encouraged pts to create a vision board, with images they picked and then cut out with safety scissors from the magazine, for themselves, that capture their short and long-term goals. LRT encouraged pts to show and explain their vision board to the group.   Goal Area(s) Addressed:  Patient will gain knowledge of short vs. long term goals.  Patient will identify goals for themselves. Patient will practice setting SMART goals. Patient will verbalize their goals to LRT and peers.  Affect/Mood: Appropriate and Flat   Participation Level: Active and Engaged   Participation Quality: Independent   Behavior: Calm and Cooperative   Speech/Thought Process: Coherent   Insight: Fair   Judgement: Fair    Modes of Intervention: Art   Patient Response to Interventions:  Receptive   Education Outcome:  Acknowledges education   Clinical Observations/Individualized Feedback: Amanda [name] was active in their participation of session activities and group discussion. Pt identified I want to get a cat and get my books published as goals.    Plan: Continue to engage patient in RT group sessions 2-3x/week.   Jeoffrey BRAVO Celestia, LRT, CTRS 06/09/2024 11:36 AM

## 2024-06-10 DIAGNOSIS — F250 Schizoaffective disorder, bipolar type: Secondary | ICD-10-CM | POA: Diagnosis not present

## 2024-06-10 NOTE — B[facility] Counselor (Addendum)
 CSW met with pt briefly to make introduction and discuss discharge/aftercare plans. Pt reported plans to return home and that they would need assistance with transportation to get there. He stated that he has outpatient services through [facility] on [address] and would like to continue with them post discharge. Pt reported that he smoke but denied interest in cessation services. He stated that he has not used any illicit substances in years. Pt complained that he did not have his reading glasses because he had accidentally sent them home with his roommate in his laptop bag. CSW informed him that he would check to see if the unit had any. He agreed. No other concerns expressed. Contact ended without incident.   CSW came back to update pt that there are no readers on the unit currently.  Pt thanked CSW. No other concerns expressed. Contact ended without incident.   Nadara SAUNDERS. Chaim, MSW, LCSW, LCAS 06/10/2024 3:41 PM

## 2024-06-10 NOTE — Plan of Care (Signed)
   Problem: Education: Goal: Emotional status will improve Outcome: Progressing Goal: Mental status will improve Outcome: Progressing   Problem: Activity: Goal: Sleeping patterns will improve Outcome: Progressing

## 2024-06-10 NOTE — Progress Notes (Signed)
   06/10/24 1000  Psych Admission Type (Psych Patients Only)  Admission Status Voluntary  Psychosocial Assessment  Patient Complaints None  Eye Contact Fair  Facial Expression Sullen  Affect Appropriate to circumstance  Speech Soft  Interaction Assertive  Motor Activity Slow  Appearance/Hygiene Layered clothes;Disheveled  Behavior Characteristics Cooperative  Mood Pleasant  Thought Process  Coherency WDL  Content WDL  Delusions None reported or observed  Perception WDL  Hallucination None reported or observed  Judgment Impaired  Confusion None  Danger to Self  Current suicidal ideation? Denies  Self-Injurious Behavior No self-injurious ideation or behavior indicators observed or expressed   Agreement Not to Harm Self Yes  Description of Agreement verbal  Danger to Others  Danger to Others None reported or observed   Patient stayed in bed most of the shift.States  I had a headache because I was reading without my glasses. I am good. Patient does not needed Tylenol  at this time.

## 2024-06-10 NOTE — Progress Notes (Signed)
 [facility] MD Progress Note  06/10/2024 4:47 PM Amanda [name]  MRN:  [id number]   Subjective:  Chart reviewed, case discussed in multidisciplinary meeting, patient seen during rounds.   10/22: On interview today, patient is found resting in bed.  He is calm and cooperative, alert and oriented.  He denies current symptoms of depression or anxiety.  He denies SI/HI/plan.  He denies hallucinations at time of interview.  He reports tolerating current medication regimen well without adverse effects.  He does not voice any concerns or complaints today.  Will continue to monitor at this time.  10/21: On interview today, patient is found cleaning up her room.  She is alert and oriented, calm and cooperative.  She reports some improvement in auditory and visual hallucinations, but notes these are still present.  She describes continuing to see shadow people and blood all over the room.  She is not observed responding to internal stimuli.  She denies SI/HI/plan.  She states she feels safe on the unit.  She reports stable sleep and appetite.  She denies current symptoms of depression or anxiety.  She reports tolerating current medication regimen well without adverse effects.  Patient is agreeable to Invega dose increase.  10/20: On interview today, patient is calm and cooperative, alert and oriented.  He endorses visual and auditory hallucinations, stating he can see shadow people with glowing green eyes holding bloody knives in the room.  He states that shadow people tell him to hurt himself but he does not have any intent to act on this.  He is able to contract for safety.  He notes auditory and visual hallucinations have improved since recently starting Invega 3 mg twice daily.  Patient also taking Prozac  60 mg once daily.  Patient reports he is tolerating current medication regimen well without adverse effects.  He denies current SI/plan, notes last episode of SI was yesterday.  He denies HI/plan.  Patient does  endorse auditory and visual hallucinations at baseline but notes they are typically not distressing as they are now.   Sleep: Fair  Appetite:  Fair  Past Psychiatric History: see h&P Family History:  Family History  Problem Relation Age of Onset   Heart attack Mother    Stroke Mother    Liver cancer Mother    Diabetes Mother    Lung cancer Mother    Alcoholism Father    Sleep apnea Brother    Depression Brother    ADD / ADHD Brother    Diabetes Maternal Aunt    Diabetes Paternal Uncle    Colon cancer Paternal Uncle    Diabetes Maternal Grandmother    Dementia Maternal Grandmother    Social History:  Social History   Substance and Sexual Activity  Alcohol Use Not Currently   Comment: quit 10 yrs ago ETOH and street drugs     Social History   Substance and Sexual Activity  Drug Use Not Currently   Types: Marijuana   Comment: current use of cannabis; history of heavy illicit drug use > 9 years ago    Social History   Socioeconomic History   Marital status: Single    Spouse name: Not on file   Number of children: 1   Years of education: 12   Highest education level: 12th grade  Occupational History   Not on file  Tobacco Use   Smoking status: Every Day    Current packs/day: 1.00    Average packs/day: 1 pack/day for 26.0 years (  26.0 ttl pk-yrs)    Types: Cigarettes, E-cigarettes    Passive exposure: Past   Smokeless tobacco: Never  Vaping Use   Vaping status: Former  Substance and Sexual Activity   Alcohol use: Not Currently    Comment: quit 10 yrs ago ETOH and street drugs   Drug use: Not Currently    Types: Marijuana    Comment: current use of cannabis; history of heavy illicit drug use > 9 years ago   Sexual activity: Not Currently    Birth control/protection: None  Other Topics Concern   Not on file  Social History Narrative   Currently homeless; living with partner and dog   Right handed   Social Drivers of Health   Financial Resource  Strain: Low Risk  (11/05/2023)   Overall Financial Resource Strain (CARDIA)    Difficulty of Paying Living Expenses: Not hard at all  Food Insecurity: Food Insecurity Present (06/06/2024)   Hunger Vital Sign    Worried About Running Out of Food in the Last Year: Sometimes true    Ran Out of Food in the Last Year: Sometimes true  Transportation Needs: No Transportation Needs (06/06/2024)   PRAPARE - Administrator, Civil Service (Medical): No    Lack of Transportation (Non-Medical): No  Physical Activity: Insufficiently Active (11/05/2023)   Exercise Vital Sign    Days of Exercise per Week: 2 days    Minutes of Exercise per Session: 60 min  Stress: No Stress Concern Present (11/05/2023)   [employer] of Occupational Health - Occupational Stress Questionnaire    Feeling of Stress : Not at all  Social Connections: Moderately Isolated (04/17/2024)   Social Connection and Isolation Panel    Frequency of Communication with Friends and Family: More than three times a week    Frequency of Social Gatherings with Friends and Family: Twice a week    Attends Religious Services: Never    Database administrator or Organizations: Yes    Attends Engineer, structural: Not on file    Marital Status: Divorced   Past Medical History:  Past Medical History:  Diagnosis Date   Anxiety    Asthma    Asthma due to environmental allergies    Asthma due to seasonal allergies    Bipolar 1 disorder (HCC)    Borderline personality disorder (HCC)    Brain bleed (HCC)    Chronic post-traumatic stress disorder (PTSD)    complex chronic with psychotic features and self harm behaviors   Complication of anesthesia    Constipation    Dander (animal) allergy    Hearing loss    right ear   Heart murmur    denies seeing a cardiologist   Hip dysplasia    Hypertension    Gestional    Major depression, chronic    Mood disorder    OCD (obsessive compulsive disorder)    Pneumonia    PONV  (postoperative nausea and vomiting)    PTSD (post-traumatic stress disorder)    RA (rheumatoid arthritis) (HCC)    Rheumatoid arthritis (HCC)    Schizophrenia (HCC)    I think that is wrong   Seizure disorder (HCC)    Suicidal ideations    Suicide attempt [facility])     Past Surgical History:  Procedure Laterality Date   Brain Shunt  1981   a few hours old   EYE SURGERY Bilateral 1985   INTRAUTERINE DEVICE (IUD) INSERTION N/A 09/16/2018   Procedure:  INTRAUTERINE DEVICE (IUD) INSERTION;  Surgeon: Corene Coy, MD;  Location: [facility];  Service: Gynecology;  Laterality: N/A;   PERINEUM REPAIR N/A 09/16/2018   Procedure: EPISIOTOMY REVISION;  Surgeon: Corene Coy, MD;  Location: [facility];  Service: Gynecology;  Laterality: N/A;   SHUNT REMOVAL  1983   Tooth Removal     multiple    Current Medications: Current Facility-Administered Medications  Medication Dose Route Frequency Provider Last Rate Last Admin   acetaminophen  (TYLENOL ) tablet 650 mg  650 mg Oral Q6H PRN Nkwenti, Doris, NP       alum & mag hydroxide-simeth (MAALOX/MYLANTA) 200-200-20 MG/5ML suspension 30 mL  30 mL Oral Q4H PRN Nkwenti, Doris, NP       feeding supplement (ENSURE PLUS HIGH PROTEIN) liquid 237 mL  237 mL Oral BID BM Hampton, Tracie B, NP   237 mL at 06/09/24 1500   FLUoxetine  (PROZAC ) capsule 60 mg  60 mg Oral Daily Hampton, Tracie B, NP   60 mg at 06/10/24 9190   gabapentin  (NEURONTIN ) capsule 300 mg  300 mg Oral QHS Tex Drilling, NP   300 mg at 06/09/24 2116   hydrOXYzine  (ATARAX ) tablet 25 mg  25 mg Oral TID PRN Tex Drilling, NP   25 mg at 06/09/24 2116   LORazepam  (ATIVAN ) tablet 1 mg  1 mg Oral Q8H PRN Jahzaria Vary L, PA-C       magnesium  hydroxide (MILK OF MAGNESIA) suspension 30 mL  30 mL Oral Daily PRN Tex Drilling, NP       multivitamin with minerals tablet 1 tablet  1 tablet Oral Daily Madaram, Kondal R, MD   1 tablet at 06/10/24 0809   nicotine  (NICODERM CQ  - dosed in mg/24  hours) patch 14 mg  14 mg Transdermal Daily Hampton, Tracie B, NP   14 mg at 06/10/24 0809   OLANZapine (ZYPREXA) tablet 5 mg  5 mg Oral Q8H PRN Danyia Borunda L, PA-C       Or   OLANZapine (ZYPREXA) injection 5 mg  5 mg Intramuscular Q8H PRN Jun Rightmyer L, PA-C       paliperidone (INVEGA) 24 hr tablet 9 mg  9 mg Oral QHS Amari Burnsworth L, PA-C        Lab Results: No results found for this or any previous visit (from the past 48 hours).  Blood Alcohol level:  Lab Results  Component Value Date   [facility] <15 06/05/2024   ETH <10 04/03/2022    Metabolic Disorder Labs: Lab Results  Component Value Date   HGBA1C 4.7 (L) 06/05/2024   MPG 88.19 06/05/2024   MPG 96.8 04/03/2022   Lab Results  Component Value Date   PROLACTIN 14.9 06/05/2024   PROLACTIN 15.3 11/03/2021   Lab Results  Component Value Date   CHOL 158 06/05/2024   TRIG 54 06/05/2024   HDL 44 06/05/2024   CHOLHDL 3.6 06/05/2024   VLDL 11 06/05/2024   LDLCALC 103 (H) 06/05/2024   LDLCALC 76 04/06/2024    Physical Findings: AIMS:  , ,  ,  ,    CIWA:    COWS:      Psychiatric Specialty Exam:  Presentation  General Appearance:  Casual  Eye Contact: Good  Speech: Clear and Coherent  Speech Volume: Normal    Mood and Affect  Mood: Euthymic  Affect: Congruent   Thought Process  Thought Processes: Coherent  Descriptions of Associations:Intact  Orientation:Full (Time, Place and Person)  Thought Content:Illogical  Hallucinations: None  Ideas of Reference: None  Suicidal Thoughts: No Homicidal Thoughts: No  Sensorium  Memory: Immediate Fair  Judgment: Fair  Insight: Fair   Art therapist  Concentration: Fair  Attention Span: Fair  Recall: [employer] of Knowledge: Fair  Language: Fair   Psychomotor Activity  Psychomotor Activity: Normal Musculoskeletal: Strength & Muscle Tone: within normal limits Gait & Station: normal Assets  Assets: Desire for  Improvement; Communication Skills    Physical Exam: Physical Exam ROS Blood pressure 115/76, pulse 66, temperature 98.3 F (36.8 C), temperature source Oral, resp. rate 18, height 5' 7 (1.702 m), weight 72.6 kg, SpO2 98%. Body mass index is 25.06 kg/m.  Diagnosis: Principal Problem:   Schizoaffective disorder, bipolar type (HCC)   PLAN: Safety and Monitoring:  -- Voluntary admission to inpatient psychiatric unit for safety, stabilization and treatment  -- Daily contact with patient to assess and evaluate symptoms and progress in treatment  -- Patient's case to be discussed in multi-disciplinary team meeting  -- Observation Level : q15 minute checks  -- Vital signs:  q12 hours  -- Precautions: suicide, elopement, and assault -- Encouraged patient to participate in unit milieu and in scheduled group therapies   2. Psychiatric Diagnoses and Treatment:  Schizoaffective Bipolar Type Invega to 9 mg at bedtime  Depression/OCD-- continue fluoxetine  60 mg             -- The risks/benefits/side-effects/alternatives to this medication were discussed in detail with the patient and time was given for questions. The patient consents to medication trial.                -- Metabolic profile and EKG monitoring obtained while on an atypical antipsychotic (BMI: Lipid Panel: HbgA1c: QTc:)              -- Encouraged patient to participate in unit milieu and in scheduled group therapies    Hospital Course:     3. Medical Issues Being Addressed:  No acute medical issues identified at this time   4. Discharge Planning:   -- Social work and case management to assist with discharge planning and identification of hospital follow-up needs prior to discharge  -- Estimated LOS: 5-7 days  Camelia LITTIE Lukes, PA-C 06/10/2024, 4:47 PM

## 2024-06-10 NOTE — Group Note (Signed)
 [facility] LCSW Group Therapy Note   Group Date: 06/10/2024 Start Time: 1300 End Time: 1400   Type of Therapy/Topic:  Group Therapy:  Emotion Regulation  Participation Level:  None   Mood:  Description of Group:    The purpose of this group is to assist patients in learning to regulate negative emotions and experience positive emotions. Patients will be guided to discuss ways in which they have been vulnerable to their negative emotions. These vulnerabilities will be juxtaposed with experiences of positive emotions or situations, and patients challenged to use positive emotions to combat negative ones. Special emphasis will be placed on coping with negative emotions in conflict situations, and patients will process healthy conflict resolution skills.  Therapeutic Goals: Patient will identify two positive emotions or experiences to reflect on in order to balance out negative emotions:  Patient will label two or more emotions that they find the most difficult to experience:  Patient will be able to demonstrate positive conflict resolution skills through discussion or role plays:   Summary of Patient Progress:   Patient was present in group, however, declined to provide any insight into topic.    Therapeutic Modalities:   Cognitive Behavioral Therapy Feelings Identification Dialectical Behavioral Therapy   Sherryle JINNY Margo, LCSW

## 2024-06-10 NOTE — Group Note (Signed)
 Date:  06/10/2024 Time:  3:22 PM  Group Topic/Focus:  Goals Group:   The focus of this group is to help patients establish daily goals to achieve during treatment and discuss how the patient can incorporate goal setting into their daily lives to aide in recovery.  Participation Level:  Active  Participation Quality:  Appropriate  Affect:  Appropriate  Cognitive:  Appropriate  Insight: Appropriate  Engagement in Group:  Engaged  Modes of Intervention:  Discussion and Education  Additional Comments:    Tajuanna Burnett A Danton Palmateer 06/10/2024, 3:22 PM

## 2024-06-10 NOTE — Group Note (Signed)
 Date:  06/10/2024 Time:  9:06 PM  Group Topic/Focus:  Wrap-Up Group:   The focus of this group is to help patients review their daily goal of treatment and discuss progress on daily workbooks.    Participation Level:  Active  Participation Quality:  Appropriate and Attentive  Affect:  Appropriate  Cognitive:  Alert and Appropriate  Insight: Appropriate and Good  Engagement in Group:  Engaged  Modes of Intervention:  Orientation  Additional Comments:     Arlester CHRISTELLA Servant 06/10/2024, 9:06 PM

## 2024-06-10 NOTE — Plan of Care (Signed)
  Problem: Education: Goal: Emotional status will improve Outcome: Progressing Goal: Mental status will improve Outcome: Progressing   Problem: Coping: Goal: Ability to verbalize frustrations and anger appropriately will improve Outcome: Progressing Goal: Ability to demonstrate self-control will improve Outcome: Progressing   Problem: Physical Regulation: Goal: Ability to maintain clinical measurements within normal limits will improve Outcome: Progressing

## 2024-06-11 DIAGNOSIS — F250 Schizoaffective disorder, bipolar type: Secondary | ICD-10-CM | POA: Diagnosis not present

## 2024-06-11 NOTE — Group Note (Signed)
 Recreation Therapy Group Note   Group Topic:General Recreation  Group Date: 06/11/2024 Start Time: 1520 End Time: 1620 Facilitators: Celestia Jeoffrey BRAVO, LRT, CTRS Location: Courtyard  Group Description: [employer]. LRT and patients played games of basketball, drew with chalk, and played corn hole while outside in the courtyard while getting fresh air and sunlight. Music was being played in the background. LRT and peers conversed about different games they have played before, what they do in their free time and anything else that is on their minds. LRT encouraged pts to drink water after being outside, sweating and getting their heart rate up.  Goal Area(s) Addressed: Patient will build on frustration tolerance skills. Patients will partake in a competitive play game with peers. Patients will gain knowledge of new leisure interest/hobby.   Affect/Mood: Appropriate   Participation Level: Active   Participation Quality: Independent   Behavior: Appropriate   Speech/Thought Process: Coherent   Insight: Good   Judgement: Good   Modes of Intervention: Activity   Patient Response to Interventions:  Receptive   Education Outcome:  In group clarification offered    Clinical Observations/Individualized Feedback: Amanda [name] was active in their participation of session activities and group discussion. Pt interacted well with LRT and peers duration of session.    Plan: Continue to engage patient in RT group sessions 2-3x/week.   [address] LRT, CTRS 06/11/2024 5:29 PM

## 2024-06-11 NOTE — Plan of Care (Signed)
   Problem: Education: Goal: Emotional status will improve Outcome: Progressing

## 2024-06-11 NOTE — Plan of Care (Signed)

## 2024-06-11 NOTE — Group Note (Signed)
 [facility] LCSW Group Therapy Note   Group Date: 06/11/2024 Start Time: 1300 End Time: 1345   Type of Therapy/Topic:  Group Therapy:  Balance in Life  Participation Level:  Active   Description of Group:    This group will address the concept of balance and how it feels and looks when one is unbalanced. Patients will be encouraged to process areas in their lives that are out of balance, and identify reasons for remaining unbalanced. Facilitators will guide patients utilizing problem- solving interventions to address and correct the stressor making their life unbalanced. Understanding and applying boundaries will be explored and addressed for obtaining  and maintaining a balanced life. Patients will be encouraged to explore ways to assertively make their unbalanced needs known to significant others in their lives, using other group members and facilitator for support and feedback.  Therapeutic Goals: Patient will identify two or more emotions or situations they have that consume much of in their lives. Patient will identify signs/triggers that life has become out of balance:  Patient will identify two ways to set boundaries in order to achieve balance in their lives:  Patient will demonstrate ability to communicate their needs through discussion and/or role plays  Summary of Patient Progress: Patient was present for the entirety of the group process. He was engaged in the discussion and his comments were pertinent. Pt appeared to have some insight into himself and the topic. He appeared to be open and receptive to feedback/comments from both her peers and the facilitator.   Therapeutic Modalities:   Cognitive Behavioral Therapy Solution-Focused Therapy Assertiveness Training   Nadara JONELLE Fam, LCSW

## 2024-06-11 NOTE — Progress Notes (Cosign Needed)
 [facility] MD Progress Note  06/11/2024 9:07 PM Amanda [name]  MRN:  [id number]   Subjective:  Chart reviewed, case discussed in multidisciplinary meeting, patient seen during rounds.   10/23: On interview today, patient is noted to be interacting appropriately with peers.  He returns to his room to engage in interview with provider.  He reports sleep and appetite remain stable.  He denies current symptoms of depression or anxiety.  He denies SI/HI/plan and denies hallucinations.  He reports tolerating current medication regimen well without adverse effects.  He is able to discuss coping skills, support system, and crisis resources.  Will continue to monitor at this time and plan discharge for Saturday if stability is maintained.  10/22: On interview today, patient is found resting in bed.  He is calm and cooperative, alert and oriented.  He denies current symptoms of depression or anxiety.  He denies SI/HI/plan.  He denies hallucinations at time of interview.  He reports tolerating current medication regimen well without adverse effects.  He does not voice any concerns or complaints today.  Will continue to monitor at this time.  10/21: On interview today, patient is found cleaning up her room.  She is alert and oriented, calm and cooperative.  She reports some improvement in auditory and visual hallucinations, but notes these are still present.  She describes continuing to see shadow people and blood all over the room.  She is not observed responding to internal stimuli.  She denies SI/HI/plan.  She states she feels safe on the unit.  She reports stable sleep and appetite.  She denies current symptoms of depression or anxiety.  She reports tolerating current medication regimen well without adverse effects.  Patient is agreeable to Invega dose increase.  10/20: On interview today, patient is calm and cooperative, alert and oriented.  He endorses visual and auditory hallucinations, stating he can see  shadow people with glowing green eyes holding bloody knives in the room.  He states that shadow people tell him to hurt himself but he does not have any intent to act on this.  He is able to contract for safety.  He notes auditory and visual hallucinations have improved since recently starting Invega 3 mg twice daily.  Patient also taking Prozac  60 mg once daily.  Patient reports he is tolerating current medication regimen well without adverse effects.  He denies current SI/plan, notes last episode of SI was yesterday.  He denies HI/plan.  Patient does endorse auditory and visual hallucinations at baseline but notes they are typically not distressing as they are now.   Sleep: Fair  Appetite:  Fair  Past Psychiatric History: see h&P Family History:  Family History  Problem Relation Age of Onset   Heart attack Mother    Stroke Mother    Liver cancer Mother    Diabetes Mother    Lung cancer Mother    Alcoholism Father    Sleep apnea Brother    Depression Brother    ADD / ADHD Brother    Diabetes Maternal Aunt    Diabetes Paternal Uncle    Colon cancer Paternal Uncle    Diabetes Maternal Grandmother    Dementia Maternal Grandmother    Social History:  Social History   Substance and Sexual Activity  Alcohol Use Not Currently   Comment: quit 10 yrs ago ETOH and street drugs     Social History   Substance and Sexual Activity  Drug Use Not Currently   Types: Marijuana  Comment: current use of cannabis; history of heavy illicit drug use > 9 years ago    Social History   Socioeconomic History   Marital status: Single    Spouse name: Not on file   Number of children: 1   Years of education: 12   Highest education level: 12th grade  Occupational History   Not on file  Tobacco Use   Smoking status: Every Day    Current packs/day: 1.00    Average packs/day: 1 pack/day for 26.0 years (26.0 ttl pk-yrs)    Types: Cigarettes, E-cigarettes    Passive exposure: Past    Smokeless tobacco: Never  Vaping Use   Vaping status: Former  Substance and Sexual Activity   Alcohol use: Not Currently    Comment: quit 10 yrs ago ETOH and street drugs   Drug use: Not Currently    Types: Marijuana    Comment: current use of cannabis; history of heavy illicit drug use > 9 years ago   Sexual activity: Not Currently    Birth control/protection: None  Other Topics Concern   Not on file  Social History Narrative   Currently homeless; living with partner and dog   Right handed   Social Drivers of Health   Financial Resource Strain: Low Risk  (11/05/2023)   Overall Financial Resource Strain (CARDIA)    Difficulty of Paying Living Expenses: Not hard at all  Food Insecurity: Food Insecurity Present (06/06/2024)   Hunger Vital Sign    Worried About Running Out of Food in the Last Year: Sometimes true    Ran Out of Food in the Last Year: Sometimes true  Transportation Needs: No Transportation Needs (06/06/2024)   PRAPARE - Administrator, Civil Service (Medical): No    Lack of Transportation (Non-Medical): No  Physical Activity: Insufficiently Active (11/05/2023)   Exercise Vital Sign    Days of Exercise per Week: 2 days    Minutes of Exercise per Session: 60 min  Stress: No Stress Concern Present (11/05/2023)   [employer] of Occupational Health - Occupational Stress Questionnaire    Feeling of Stress : Not at all  Social Connections: Moderately Isolated (04/17/2024)   Social Connection and Isolation Panel    Frequency of Communication with Friends and Family: More than three times a week    Frequency of Social Gatherings with Friends and Family: Twice a week    Attends Religious Services: Never    Database administrator or Organizations: Yes    Attends Engineer, structural: Not on file    Marital Status: Divorced   Past Medical History:  Past Medical History:  Diagnosis Date   Anxiety    Asthma    Asthma due to environmental  allergies    Asthma due to seasonal allergies    Bipolar 1 disorder (HCC)    Borderline personality disorder (HCC)    Brain bleed (HCC)    Chronic post-traumatic stress disorder (PTSD)    complex chronic with psychotic features and self harm behaviors   Complication of anesthesia    Constipation    Dander (animal) allergy    Hearing loss    right ear   Heart murmur    denies seeing a cardiologist   Hip dysplasia    Hypertension    Gestional    Major depression, chronic    Mood disorder    OCD (obsessive compulsive disorder)    Pneumonia    PONV (postoperative nausea and vomiting)  PTSD (post-traumatic stress disorder)    RA (rheumatoid arthritis) (HCC)    Rheumatoid arthritis (HCC)    Schizophrenia (HCC)    I think that is wrong   Seizure disorder (HCC)    Suicidal ideations    Suicide attempt [facility])     Past Surgical History:  Procedure Laterality Date   Brain Shunt  1981   a few hours old   EYE SURGERY Bilateral 1985   INTRAUTERINE DEVICE (IUD) INSERTION N/A 09/16/2018   Procedure: INTRAUTERINE DEVICE (IUD) INSERTION;  Surgeon: Corene Coy, MD;  Location: [facility];  Service: Gynecology;  Laterality: N/A;   PERINEUM REPAIR N/A 09/16/2018   Procedure: EPISIOTOMY REVISION;  Surgeon: Corene Coy, MD;  Location: [facility];  Service: Gynecology;  Laterality: N/A;   SHUNT REMOVAL  1983   Tooth Removal     multiple    Current Medications: Current Facility-Administered Medications  Medication Dose Route Frequency Provider Last Rate Last Admin   acetaminophen  (TYLENOL ) tablet 650 mg  650 mg Oral Q6H PRN Nkwenti, Doris, NP       alum & mag hydroxide-simeth (MAALOX/MYLANTA) 200-200-20 MG/5ML suspension 30 mL  30 mL Oral Q4H PRN Nkwenti, Doris, NP       feeding supplement (ENSURE PLUS HIGH PROTEIN) liquid 237 mL  237 mL Oral BID BM Hampton, Tracie B, NP   237 mL at 06/10/24 1424   FLUoxetine  (PROZAC ) capsule 60 mg  60 mg Oral Daily Hampton, Tracie B, NP    60 mg at 06/11/24 9171   gabapentin  (NEURONTIN ) capsule 300 mg  300 mg Oral QHS Tex Drilling, NP   300 mg at 06/10/24 2114   hydrOXYzine  (ATARAX ) tablet 25 mg  25 mg Oral TID PRN Tex Drilling, NP   25 mg at 06/09/24 2116   LORazepam  (ATIVAN ) tablet 1 mg  1 mg Oral Q8H PRN Constantinos Krempasky L, PA-C       magnesium  hydroxide (MILK OF MAGNESIA) suspension 30 mL  30 mL Oral Daily PRN Tex Drilling, NP       multivitamin with minerals tablet 1 tablet  1 tablet Oral Daily Madaram, Kondal R, MD   1 tablet at 06/11/24 9171   nicotine  (NICODERM CQ  - dosed in mg/24 hours) patch 14 mg  14 mg Transdermal Daily Hampton, Tracie B, NP   14 mg at 06/11/24 0827   OLANZapine (ZYPREXA) tablet 5 mg  5 mg Oral Q8H PRN Brucha Ahlquist L, PA-C       Or   OLANZapine (ZYPREXA) injection 5 mg  5 mg Intramuscular Q8H PRN Abrianna Sidman L, PA-C       paliperidone (INVEGA) 24 hr tablet 9 mg  9 mg Oral QHS Tommy Goostree L, PA-C   9 mg at 06/10/24 2113    Lab Results: No results found for this or any previous visit (from the past 48 hours).  Blood Alcohol level:  Lab Results  Component Value Date   [facility] <15 06/05/2024   ETH <10 04/03/2022    Metabolic Disorder Labs: Lab Results  Component Value Date   HGBA1C 4.7 (L) 06/05/2024   MPG 88.19 06/05/2024   MPG 96.8 04/03/2022   Lab Results  Component Value Date   PROLACTIN 14.9 06/05/2024   PROLACTIN 15.3 11/03/2021   Lab Results  Component Value Date   CHOL 158 06/05/2024   TRIG 54 06/05/2024   HDL 44 06/05/2024   CHOLHDL 3.6 06/05/2024   VLDL 11 06/05/2024   LDLCALC 103 (H) 06/05/2024  LDLCALC 76 04/06/2024    Physical Findings: AIMS:  , ,  ,  ,    CIWA:    COWS:      Psychiatric Specialty Exam:  Presentation  General Appearance:  Casual  Eye Contact: Good  Speech: Clear and Coherent  Speech Volume: Normal    Mood and Affect  Mood: Euthymic  Affect: Congruent   Thought Process  Thought  Processes: Coherent  Descriptions of Associations:Intact  Orientation:Full (Time, Place and Person)  Thought Content:WDL  Hallucinations: None  Ideas of Reference: None  Suicidal Thoughts: No Homicidal Thoughts: No  Sensorium  Memory: Immediate Fair  Judgment: Fair  Insight: Fair   Art therapist  Concentration: Fair  Attention Span: Fair  Recall: [employer] of Knowledge: Fair  Language: Fair   Psychomotor Activity  Psychomotor Activity: Normal Musculoskeletal: Strength & Muscle Tone: within normal limits Gait & Station: normal Assets  Assets: Desire for Improvement; Communication Skills    Physical Exam: Physical Exam ROS Blood pressure 116/72, pulse (!) 57, temperature 98.5 F (36.9 C), temperature source Oral, resp. rate 18, height 5' 7 (1.702 m), weight 72.6 kg, SpO2 100%. Body mass index is 25.06 kg/m.  Diagnosis: Principal Problem:   Schizoaffective disorder, bipolar type (HCC)   PLAN: Safety and Monitoring:  -- Voluntary admission to inpatient psychiatric unit for safety, stabilization and treatment  -- Daily contact with patient to assess and evaluate symptoms and progress in treatment  -- Patient's case to be discussed in multi-disciplinary team meeting  -- Observation Level : q15 minute checks  -- Vital signs:  q12 hours  -- Precautions: suicide, elopement, and assault -- Encouraged patient to participate in unit milieu and in scheduled group therapies   2. Psychiatric Diagnoses and Treatment:  Schizoaffective Bipolar Type Invega 9 mg at bedtime  Depression/OCD-- continue fluoxetine  60 mg             -- The risks/benefits/side-effects/alternatives to this medication were discussed in detail with the patient and time was given for questions. The patient consents to medication trial.                -- Metabolic profile and EKG monitoring obtained while on an atypical antipsychotic (BMI: Lipid Panel: HbgA1c: QTc:)               -- Encouraged patient to participate in unit milieu and in scheduled group therapies    Hospital Course:     3. Medical Issues Being Addressed:  No acute medical issues identified at this time   4. Discharge Planning:   -- Social work and case management to assist with discharge planning and identification of hospital follow-up needs prior to discharge  -- Estimated LOS: 5-7 days  Camelia LITTIE Lukes, PA-C 06/11/2024, 9:07 PM

## 2024-06-11 NOTE — Plan of Care (Signed)
   Problem: Education: Goal: Knowledge of Amanda [name] General Education information/materials will improve Outcome: Progressing Goal: Emotional status will improve Outcome: Progressing Goal: Mental status will improve Outcome: Progressing Goal: Verbalization of understanding the information provided will improve Outcome: Progressing   Problem: Activity: Goal: Interest or engagement in activities will improve Outcome: Progressing Goal: Sleeping patterns will improve Outcome: Progressing   Problem: Coping: Goal: Ability to verbalize frustrations and anger appropriately will improve Outcome: Progressing Goal: Ability to demonstrate self-control will improve Outcome: Progressing

## 2024-06-11 NOTE — Progress Notes (Signed)
   06/10/24 2200  Psych Admission Type (Psych Patients Only)  Admission Status Voluntary  Psychosocial Assessment  Patient Complaints None  Eye Contact Fair  Facial Expression Flat  Affect Appropriate to circumstance  Speech Soft  Interaction Assertive  Motor Activity Other (Comment) (WDL)  Appearance/Hygiene Layered clothes  Behavior Characteristics Cooperative;Appropriate to situation  Mood Pleasant  Thought Process  Coherency WDL  Content WDL  Delusions None reported or observed  Perception WDL  Hallucination None reported or observed  Judgment Limited  Confusion None  Danger to Self  Current suicidal ideation? Denies  Agreement Not to Harm Self Yes  Description of Agreement Verbal  Danger to Others  Danger to Others None reported or observed

## 2024-06-11 NOTE — Group Note (Signed)
 Date:  06/11/2024 Time:  11:17 PM  Group Topic/Focus:  Coping With Mental Health Crisis:   The purpose of this group is to help patients identify strategies for coping with mental health crisis.  Group discusses possible causes of crisis and ways to manage them effectively.    Participation Level:  Active  Participation Quality:  Appropriate  Affect:  Appropriate  Cognitive:  Appropriate  Insight: Appropriate  Engagement in Group:  Engaged  Modes of Intervention:  Education  Additional Comments:    Luciano Emilio CROME 06/11/2024, 11:17 PM

## 2024-06-11 NOTE — Progress Notes (Signed)
   06/11/24 0828  Psych Admission Type (Psych Patients Only)  Admission Status Voluntary  Psychosocial Assessment  Patient Complaints None  Eye Contact Fair  Facial Expression Flat  Affect Appropriate to circumstance  Speech Soft  Interaction Assertive  Motor Activity Other (Comment) (WNL)  Appearance/Hygiene Layered clothes  Behavior Characteristics Cooperative;Appropriate to situation  Mood Pleasant  Aggressive Behavior  Targets Self  Type of Behavior Other (Comment) (pt chews the inside of her mouth)  Effect No apparent injury  Thought Process  Coherency WDL  Content WDL  Delusions None reported or observed  Perception WDL  Hallucination None reported or observed  Judgment Poor  Confusion None  Danger to Self  Current suicidal ideation? Denies  Self-Injurious Behavior No self-injurious ideation or behavior indicators observed or expressed   Agreement Not to Harm Self Yes  Description of Agreement Verbal  Danger to Others  Danger to Others None reported or observed

## 2024-06-11 NOTE — Group Note (Signed)
 Recreation Therapy Group Note   Group Topic:Coping Skills  Group Date: 06/11/2024 Start Time: 1000 End Time: 1055 Facilitators: Celestia Jeoffrey BRAVO, LRT, CTRS Location: Craft Room  Group Description: Mind Map.  Patient was provided a blank template of a diagram with 32 blank boxes in a tiered system, branching from the center (similar to a bubble chart). LRT directed patients to label the middle of the diagram Coping Skills. LRT and patients then came up with 8 different coping skills as examples. Pt were directed to record their coping skills in the 2nd tier boxes closest to the center.  Patients would then share their coping skills with the group as LRT wrote them out. LRT gave a handout of 99 different coping skills at the end of group.   Goal Area(s) Addressed: Patients will be able to define "coping skills". Patient will identify new coping skills.  Patient will increase communication.   Affect/Mood: Appropriate   Participation Level: Active and Engaged   Participation Quality: Independent   Behavior: Alert, Appropriate, and Cooperative   Speech/Thought Process: Coherent   Insight: Good and Improved   Judgement: Good and Improved   Modes of Intervention: Clarification, Education, Worksheet, and Writing   Patient Response to Interventions:  Attentive, Engaged, Interested , and Receptive   Education Outcome:  Acknowledges education   Clinical Observations/Individualized Feedback: Amanda [name] was active in their participation of session activities and group discussion. Pt identified writing and taking a nap as coping skills.    Plan: Continue to engage patient in RT group sessions 2-3x/week.   Jeoffrey BRAVO Celestia, LRT, CTRS 06/11/2024 11:33 AM

## 2024-06-12 ENCOUNTER — Other Ambulatory Visit: Payer: Self-pay

## 2024-06-12 DIAGNOSIS — F250 Schizoaffective disorder, bipolar type: Secondary | ICD-10-CM | POA: Diagnosis not present

## 2024-06-12 MED ORDER — RISPERIDONE 1 MG PO TABS
0.5000 mg | ORAL_TABLET | Freq: Once | ORAL | Status: AC
  Administered 2024-06-12: 0.5 mg via ORAL
  Filled 2024-06-12: qty 1

## 2024-06-12 MED ORDER — FLUOXETINE HCL 20 MG PO CAPS
60.0000 mg | ORAL_CAPSULE | Freq: Every day | ORAL | 0 refills | Status: DC
  Filled 2024-06-12: qty 90, 30d supply, fill #0

## 2024-06-12 MED ORDER — RISPERIDONE 3 MG PO TABS
3.0000 mg | ORAL_TABLET | Freq: Every day | ORAL | 0 refills | Status: DC
  Filled 2024-06-12: qty 30, 30d supply, fill #0

## 2024-06-12 MED ORDER — PALIPERIDONE ER 9 MG PO TB24
9.0000 mg | ORAL_TABLET | Freq: Every day | ORAL | 0 refills | Status: DC
  Filled 2024-06-12: qty 30, 30d supply, fill #0

## 2024-06-12 NOTE — Care Management Important Message (Signed)
 Important Message  Patient Details  Name: Amanda [name] MRN: [id number] Date of Birth: 05/03/1981   Medicare Important Message Given:  Yes - Medicare IM     Edman Lipsey M Kaisyn Reinhold, LCSW 06/12/2024, 9:19 AM

## 2024-06-12 NOTE — Plan of Care (Signed)

## 2024-06-12 NOTE — Progress Notes (Incomplete)
 [facility] MD Progress Note  06/12/2024 8:58 AM Amanda [name]  MRN:  [id number]   Subjective:  Chart reviewed, case discussed in multidisciplinary meeting, patient seen during rounds.   10/24:  10/23: On interview today, patient is noted to be interacting appropriately with peers.  He returns to his room to engage in interview with provider.  He reports sleep and appetite remain stable.  He denies current symptoms of depression or anxiety.  He denies SI/HI/plan and denies hallucinations.  He reports tolerating current medication regimen well without adverse effects.  He is able to discuss coping skills, support system, and crisis resources.  Will continue to monitor at this time and plan discharge for Saturday if stability is maintained.  10/22: On interview today, patient is found resting in bed.  He is calm and cooperative, alert and oriented.  He denies current symptoms of depression or anxiety.  He denies SI/HI/plan.  He denies hallucinations at time of interview.  He reports tolerating current medication regimen well without adverse effects.  He does not voice any concerns or complaints today.  Will continue to monitor at this time.  10/21: On interview today, patient is found cleaning up her room.  She is alert and oriented, calm and cooperative.  She reports some improvement in auditory and visual hallucinations, but notes these are still present.  She describes continuing to see shadow people and blood all over the room.  She is not observed responding to internal stimuli.  She denies SI/HI/plan.  She states she feels safe on the unit.  She reports stable sleep and appetite.  She denies current symptoms of depression or anxiety.  She reports tolerating current medication regimen well without adverse effects.  Patient is agreeable to Invega dose increase.  10/20: On interview today, patient is calm and cooperative, alert and oriented.  He endorses visual and auditory hallucinations, stating he  can see shadow people with glowing green eyes holding bloody knives in the room.  He states that shadow people tell him to hurt himself but he does not have any intent to act on this.  He is able to contract for safety.  He notes auditory and visual hallucinations have improved since recently starting Invega 3 mg twice daily.  Patient also taking Prozac  60 mg once daily.  Patient reports he is tolerating current medication regimen well without adverse effects.  He denies current SI/plan, notes last episode of SI was yesterday.  He denies HI/plan.  Patient does endorse auditory and visual hallucinations at baseline but notes they are typically not distressing as they are now.   Sleep: Fair  Appetite:  Fair  Past Psychiatric History: see h&P Family History:  Family History  Problem Relation Age of Onset   Heart attack Mother    Stroke Mother    Liver cancer Mother    Diabetes Mother    Lung cancer Mother    Alcoholism Father    Sleep apnea Brother    Depression Brother    ADD / ADHD Brother    Diabetes Maternal Aunt    Diabetes Paternal Uncle    Colon cancer Paternal Uncle    Diabetes Maternal Grandmother    Dementia Maternal Grandmother    Social History:  Social History   Substance and Sexual Activity  Alcohol Use Not Currently   Comment: quit 10 yrs ago ETOH and street drugs     Social History   Substance and Sexual Activity  Drug Use Not Currently  Types: Marijuana   Comment: current use of cannabis; history of heavy illicit drug use > 9 years ago    Social History   Socioeconomic History   Marital status: Single    Spouse name: Not on file   Number of children: 1   Years of education: 12   Highest education level: 12th grade  Occupational History   Not on file  Tobacco Use   Smoking status: Every Day    Current packs/day: 1.00    Average packs/day: 1 pack/day for 26.0 years (26.0 ttl pk-yrs)    Types: Cigarettes, E-cigarettes    Passive exposure: Past    Smokeless tobacco: Never  Vaping Use   Vaping status: Former  Substance and Sexual Activity   Alcohol use: Not Currently    Comment: quit 10 yrs ago ETOH and street drugs   Drug use: Not Currently    Types: Marijuana    Comment: current use of cannabis; history of heavy illicit drug use > 9 years ago   Sexual activity: Not Currently    Birth control/protection: None  Other Topics Concern   Not on file  Social History Narrative   Currently homeless; living with partner and dog   Right handed   Social Drivers of Health   Financial Resource Strain: Low Risk  (11/05/2023)   Overall Financial Resource Strain (CARDIA)    Difficulty of Paying Living Expenses: Not hard at all  Food Insecurity: Food Insecurity Present (06/06/2024)   Hunger Vital Sign    Worried About Running Out of Food in the Last Year: Sometimes true    Ran Out of Food in the Last Year: Sometimes true  Transportation Needs: No Transportation Needs (06/06/2024)   PRAPARE - Administrator, Civil Service (Medical): No    Lack of Transportation (Non-Medical): No  Physical Activity: Insufficiently Active (11/05/2023)   Exercise Vital Sign    Days of Exercise per Week: 2 days    Minutes of Exercise per Session: 60 min  Stress: No Stress Concern Present (11/05/2023)   [employer] of Occupational Health - Occupational Stress Questionnaire    Feeling of Stress : Not at all  Social Connections: Moderately Isolated (04/17/2024)   Social Connection and Isolation Panel    Frequency of Communication with Friends and Family: More than three times a week    Frequency of Social Gatherings with Friends and Family: Twice a week    Attends Religious Services: Never    Database administrator or Organizations: Yes    Attends Engineer, structural: Not on file    Marital Status: Divorced   Past Medical History:  Past Medical History:  Diagnosis Date   Anxiety    Asthma    Asthma due to environmental  allergies    Asthma due to seasonal allergies    Bipolar 1 disorder (HCC)    Borderline personality disorder (HCC)    Brain bleed (HCC)    Chronic post-traumatic stress disorder (PTSD)    complex chronic with psychotic features and self harm behaviors   Complication of anesthesia    Constipation    Dander (animal) allergy    Hearing loss    right ear   Heart murmur    denies seeing a cardiologist   Hip dysplasia    Hypertension    Gestional    Major depression, chronic    Mood disorder    OCD (obsessive compulsive disorder)    Pneumonia    PONV (  postoperative nausea and vomiting)    PTSD (post-traumatic stress disorder)    RA (rheumatoid arthritis) (HCC)    Rheumatoid arthritis (HCC)    Schizophrenia (HCC)    I think that is wrong   Seizure disorder (HCC)    Suicidal ideations    Suicide attempt [facility])     Past Surgical History:  Procedure Laterality Date   Brain Shunt  1981   a few hours old   EYE SURGERY Bilateral 1985   INTRAUTERINE DEVICE (IUD) INSERTION N/A 09/16/2018   Procedure: INTRAUTERINE DEVICE (IUD) INSERTION;  Surgeon: Corene Coy, MD;  Location: [facility];  Service: Gynecology;  Laterality: N/A;   PERINEUM REPAIR N/A 09/16/2018   Procedure: EPISIOTOMY REVISION;  Surgeon: Corene Coy, MD;  Location: [facility];  Service: Gynecology;  Laterality: N/A;   SHUNT REMOVAL  1983   Tooth Removal     multiple    Current Medications: Current Facility-Administered Medications  Medication Dose Route Frequency Provider Last Rate Last Admin   acetaminophen  (TYLENOL ) tablet 650 mg  650 mg Oral Q6H PRN Nkwenti, Doris, NP       alum & mag hydroxide-simeth (MAALOX/MYLANTA) 200-200-20 MG/5ML suspension 30 mL  30 mL Oral Q4H PRN Nkwenti, Doris, NP       feeding supplement (ENSURE PLUS HIGH PROTEIN) liquid 237 mL  237 mL Oral BID BM Hampton, Tracie B, NP   237 mL at 06/10/24 1424   FLUoxetine  (PROZAC ) capsule 60 mg  60 mg Oral Daily Hampton, Tracie B, NP    60 mg at 06/12/24 9188   gabapentin  (NEURONTIN ) capsule 300 mg  300 mg Oral QHS Tex Drilling, NP   300 mg at 06/11/24 2120   hydrOXYzine  (ATARAX ) tablet 25 mg  25 mg Oral TID PRN Tex Drilling, NP   25 mg at 06/09/24 2116   LORazepam  (ATIVAN ) tablet 1 mg  1 mg Oral Q8H PRN Michai Dieppa L, PA-C       magnesium  hydroxide (MILK OF MAGNESIA) suspension 30 mL  30 mL Oral Daily PRN Tex Drilling, NP       multivitamin with minerals tablet 1 tablet  1 tablet Oral Daily Madaram, Kondal R, MD   1 tablet at 06/12/24 0811   nicotine  (NICODERM CQ  - dosed in mg/24 hours) patch 14 mg  14 mg Transdermal Daily Hampton, Tracie B, NP   14 mg at 06/12/24 0812   OLANZapine (ZYPREXA) tablet 5 mg  5 mg Oral Q8H PRN Mega Kinkade L, PA-C       Or   OLANZapine (ZYPREXA) injection 5 mg  5 mg Intramuscular Q8H PRN Annaliyah Willig L, PA-C       paliperidone (INVEGA) 24 hr tablet 9 mg  9 mg Oral QHS Holliday Sheaffer L, PA-C   9 mg at 06/11/24 2119    Lab Results: No results found for this or any previous visit (from the past 48 hours).  Blood Alcohol level:  Lab Results  Component Value Date   [facility] <15 06/05/2024   ETH <10 04/03/2022    Metabolic Disorder Labs: Lab Results  Component Value Date   HGBA1C 4.7 (L) 06/05/2024   MPG 88.19 06/05/2024   MPG 96.8 04/03/2022   Lab Results  Component Value Date   PROLACTIN 14.9 06/05/2024   PROLACTIN 15.3 11/03/2021   Lab Results  Component Value Date   CHOL 158 06/05/2024   TRIG 54 06/05/2024   HDL 44 06/05/2024   CHOLHDL 3.6 06/05/2024   VLDL 11 06/05/2024  LDLCALC 103 (H) 06/05/2024   LDLCALC 76 04/06/2024    Physical Findings: AIMS:  , ,  ,  ,    CIWA:    COWS:      Psychiatric Specialty Exam:  Presentation  General Appearance:  Casual  Eye Contact: Good  Speech: Clear and Coherent  Speech Volume: Normal    Mood and Affect  Mood: Euthymic  Affect: Congruent   Thought Process  Thought  Processes: Coherent  Descriptions of Associations:Intact  Orientation:Full (Time, Place and Person)  Thought Content:WDL  Hallucinations: None  Ideas of Reference: None  Suicidal Thoughts: No Homicidal Thoughts: No  Sensorium  Memory: Immediate Fair  Judgment: Fair  Insight: Fair   Art therapist  Concentration: Fair  Attention Span: Fair  Recall: [employer] of Knowledge: Fair  Language: Fair   Psychomotor Activity  Psychomotor Activity: Normal Musculoskeletal: Strength & Muscle Tone: within normal limits Gait & Station: normal Assets  Assets: Desire for Improvement; Communication Skills    Physical Exam: Physical Exam ROS Blood pressure 98/66, pulse (!) 59, temperature 98.4 F (36.9 C), temperature source Oral, resp. rate 19, height 5' 7 (1.702 m), weight 72.6 kg, SpO2 98%. Body mass index is 25.06 kg/m.  Diagnosis: Principal Problem:   Schizoaffective disorder, bipolar type (HCC)   PLAN: Safety and Monitoring:  -- Voluntary admission to inpatient psychiatric unit for safety, stabilization and treatment  -- Daily contact with patient to assess and evaluate symptoms and progress in treatment  -- Patient's case to be discussed in multi-disciplinary team meeting  -- Observation Level : q15 minute checks  -- Vital signs:  q12 hours  -- Precautions: suicide, elopement, and assault -- Encouraged patient to participate in unit milieu and in scheduled group therapies   2. Psychiatric Diagnoses and Treatment:  Schizoaffective Bipolar Type Invega 9 mg at bedtime  Depression/OCD-- continue fluoxetine  60 mg             -- The risks/benefits/side-effects/alternatives to this medication were discussed in detail with the patient and time was given for questions. The patient consents to medication trial.                -- Metabolic profile and EKG monitoring obtained while on an atypical antipsychotic (BMI: Lipid Panel: HbgA1c: QTc:)               -- Encouraged patient to participate in unit milieu and in scheduled group therapies    Hospital Course:     3. Medical Issues Being Addressed:  No acute medical issues identified at this time   4. Discharge Planning:   -- Social work and case management to assist with discharge planning and identification of hospital follow-up needs prior to discharge  -- Estimated LOS: 5-7 days  Camelia LITTIE Lukes, PA-C 06/12/2024, 8:58 AM

## 2024-06-12 NOTE — Progress Notes (Signed)
   06/11/24 2200  Psych Admission Type (Psych Patients Only)  Admission Status Voluntary  Psychosocial Assessment  Patient Complaints None  Eye Contact Fair  Facial Expression Flat  Affect Appropriate to circumstance  Speech Soft  Interaction Assertive  Motor Activity Slow  Appearance/Hygiene Layered clothes  Behavior Characteristics Cooperative  Mood Pleasant  Thought Process  Coherency WDL  Content WDL  Delusions None reported or observed  Perception WDL  Hallucination None reported or observed  Judgment Poor  Confusion None  Danger to Self  Current suicidal ideation? Denies  Agreement Not to Harm Self Yes  Description of Agreement verbal  Danger to Others  Danger to Others None reported or observed

## 2024-06-12 NOTE — [facility] Suicide Risk Assessment (Addendum)
 [facility] Discharge Suicide Risk Assessment   Principal Problem: Schizoaffective disorder, bipolar type (HCC) Discharge Diagnoses: Principal Problem:   Schizoaffective disorder, bipolar type (HCC)   Total Time spent with patient: 30 minutes  Musculoskeletal: Strength & Muscle Tone: within normal limits Gait & Station: normal Patient leans: N/A  Psychiatric Specialty Exam  Presentation  General Appearance:  Appropriate for Environment  Eye Contact: Good  Speech: Clear and Coherent  Speech Volume: Normal  Handedness: Right   Mood and Affect  Mood: Euthymic  Duration of Depression Symptoms: Greater than two weeks  Affect: Appropriate   Thought Process  Thought Processes: Coherent; Linear  Descriptions of Associations:Intact  Orientation:Full (Time, Place and Person)  Thought Content:WDL  History of Schizophrenia/Schizoaffective disorder: Yes Duration of Psychotic Symptoms:Greater than six months  Hallucinations:Hallucinations: None  Ideas of Reference:None  Suicidal Thoughts:Suicidal Thoughts: No  Homicidal Thoughts:Homicidal Thoughts: No   Sensorium  Memory: Immediate Fair; Recent Fair; Remote Fair  Judgment: Fair  Insight: Fair   Art therapist  Concentration: Fair  Attention Span: Fair  Recall: Fair  Fund of Knowledge: Fair  Language: Fair   Psychomotor Activity  Psychomotor Activity: Psychomotor Activity: Normal   Assets  Assets: Manufacturing systems engineer; Housing; Social Support   Sleep  Sleep: Sleep: Good  Estimated Sleeping Duration (Last 24 Hours): 6.50-8.00 hours  Physical Exam: Physical Exam ROS Blood pressure 98/66, pulse (!) 59, temperature 98.4 F (36.9 C), temperature source Oral, resp. rate 19, height 5' 7 (1.702 m), weight 72.6 kg, SpO2 98%. Body mass index is 25.06 kg/m.  Mental Status Per Nursing Assessment::   On Admission:  Suicidal ideation indicated by patient  Demographic Factors:   Caucasian  Loss Factors: NA  Historical Factors: Prior suicide attempts  Risk Reduction Factors:   Living with another person, especially a relative and Positive social support  Continued Clinical Symptoms:  Previous Psychiatric Diagnoses and Treatments  Cognitive Features That Contribute To Risk:  None    Suicide Risk:  Minimal: No identifiable suicidal ideation.  Patients presenting with no risk factors but with morbid ruminations; may be classified as minimal risk based on the severity of the depressive symptoms   Follow-up Information     [facility] Follow up.   Specialty: Urgent Care Why: In person psychiatry appointment is 06/16/24 IS 2:30 PM. 06/24/24 at 3:00 PM. Contact information: [address] [place]  [address] [phone]        [facility] Follow up.   Why: In person psychiatry appointment is 06/16/24 AT 2:30 PM w/ Dr. Brooks, MD.   In person therapy appointment is 06/24/24 at 3:00 PM w/ Juliene MATSU., LCSW. Contact information: [address] [place], [place] [address]  Phone: [phone] Fax: [phone]                Plan Of Care/Follow-up recommendations:  Activity:  as tolerated  Camelia LITTIE Lukes, PA-C 06/12/2024, 12:23 PM

## 2024-06-12 NOTE — Discharge Summary (Signed)
 Physician Discharge Summary Note  Patient:  Amanda [name] is an 43 y.o., adult MRN:  [id number] DOB:  1980-11-23 Patient phone:  [phone] (home)  Patient address:   [address] [place] [address],   Total time spent: 40 min Date of Admission:  06/06/2024 Date of Discharge: 06/12/2024  Reason for Admission:  Suicidal ideation, hallucinations  Principal Problem: Schizoaffective disorder, bipolar type [facility]) Discharge Diagnoses: Principal Problem:   Schizoaffective disorder, bipolar type (HCC)   Past Psychiatric History: See H&P  Family Psychiatric  History: See H&P Social History:  Social History   Substance and Sexual Activity  Alcohol Use Not Currently   Comment: quit 10 yrs ago ETOH and street drugs     Social History   Substance and Sexual Activity  Drug Use Not Currently   Types: Marijuana   Comment: current use of cannabis; history of heavy illicit drug use > 9 years ago    Social History   Socioeconomic History   Marital status: Single    Spouse name: Not on file   Number of children: 1   Years of education: 12   Highest education level: 12th grade  Occupational History   Not on file  Tobacco Use   Smoking status: Every Day    Current packs/day: 1.00    Average packs/day: 1 pack/day for 26.0 years (26.0 ttl pk-yrs)    Types: Cigarettes, E-cigarettes    Passive exposure: Past   Smokeless tobacco: Never  Vaping Use   Vaping status: Former  Substance and Sexual Activity   Alcohol use: Not Currently    Comment: quit 10 yrs ago ETOH and street drugs   Drug use: Not Currently    Types: Marijuana    Comment: current use of cannabis; history of heavy illicit drug use > 9 years ago   Sexual activity: Not Currently    Birth control/protection: None  Other Topics Concern   Not on file  Social History Narrative   Currently homeless; living with partner and dog   Right handed   Social Drivers of Health   Financial Resource  Strain: Low Risk  (11/05/2023)   Overall Financial Resource Strain (CARDIA)    Difficulty of Paying Living Expenses: Not hard at all  Food Insecurity: Food Insecurity Present (06/06/2024)   Hunger Vital Sign    Worried About Running Out of Food in the Last Year: Sometimes true    Ran Out of Food in the Last Year: Sometimes true  Transportation Needs: No Transportation Needs (06/06/2024)   PRAPARE - Administrator, Civil Service (Medical): No    Lack of Transportation (Non-Medical): No  Physical Activity: Insufficiently Active (11/05/2023)   Exercise Vital Sign    Days of Exercise per Week: 2 days    Minutes of Exercise per Session: 60 min  Stress: No Stress Concern Present (11/05/2023)   [employer] of Occupational Health - Occupational Stress Questionnaire    Feeling of Stress : Not at all  Social Connections: Moderately Isolated (04/17/2024)   Social Connection and Isolation Panel    Frequency of Communication with Friends and Family: More than three times a week    Frequency of Social Gatherings with Friends and Family: Twice a week    Attends Religious Services: Never    Database Administrator or Organizations: Yes    Attends Banker Meetings: Not on file    Marital Status: Divorced   Past Medical History:  Past  Medical History:  Diagnosis Date   Anxiety    Asthma    Asthma due to environmental allergies    Asthma due to seasonal allergies    Bipolar 1 disorder (HCC)    Borderline personality disorder (HCC)    Brain bleed (HCC)    Chronic post-traumatic stress disorder (PTSD)    complex chronic with psychotic features and self harm behaviors   Complication of anesthesia    Constipation    Dander (animal) allergy    Hearing loss    right ear   Heart murmur    denies seeing a cardiologist   Hip dysplasia    Hypertension    Gestional    Major depression, chronic    Mood disorder    OCD (obsessive compulsive disorder)    Pneumonia    PONV  (postoperative nausea and vomiting)    PTSD (post-traumatic stress disorder)    RA (rheumatoid arthritis) (HCC)    Rheumatoid arthritis (HCC)    Schizophrenia (HCC)    I think that is wrong   Seizure disorder (HCC)    Suicidal ideations    Suicide attempt [facility])     Past Surgical History:  Procedure Laterality Date   Brain Shunt  1981   a few hours old   EYE SURGERY Bilateral 1985   INTRAUTERINE DEVICE (IUD) INSERTION N/A 09/16/2018   Procedure: INTRAUTERINE DEVICE (IUD) INSERTION;  Surgeon: Corene Coy, MD;  Location: [facility];  Service: Gynecology;  Laterality: N/A;   PERINEUM REPAIR N/A 09/16/2018   Procedure: EPISIOTOMY REVISION;  Surgeon: Corene Coy, MD;  Location: [facility];  Service: Gynecology;  Laterality: N/A;   SHUNT REMOVAL  1983   Tooth Removal     multiple   Family History:  Family History  Problem Relation Age of Onset   Heart attack Mother    Stroke Mother    Liver cancer Mother    Diabetes Mother    Lung cancer Mother    Alcoholism Father    Sleep apnea Brother    Depression Brother    ADD / ADHD Brother    Diabetes Maternal Aunt    Diabetes Paternal Uncle    Colon cancer Paternal Uncle    Diabetes Maternal Grandmother    Dementia Maternal Grandmother     Hospital Course:   Throughout the admission, the patient was calm, cooperative, and participated appropriately in treatment. On 10/20, he endorsed ongoing auditory and visual hallucinations, though noted some improvement since initiation of Invega, which was titrated during the hospitalization. He denied suicidal or homicidal ideation and was able to contract for safety. By 10/21, he reported further improvement in hallucinations and continued to tolerate his medication regimen well. On 10/22 and 10/23, he denied any hallucinations, as well as any depressive or anxiety symptoms. At time of discharge, he remained linear, logical, and goal-directed in thought process, with no evidence of  psychosis, mania, or mood disturbance. He actively and appropriately engaged with peers and staff, utilized coping skills, and maintained safe behaviors on the unit. Sleep and appetite were stable throughout stay. On day of discharge, he denied suicidal or homicidal ideation, intent, or plan, and denied access to firearms or other lethal means.  He was able to discuss coping skills, support system, and crisis resources.  He was linear, logical, and future oriented.  On day of discharge, treatment team is informed by pharmacist that Invega may be cost prohibitive for the patient due to insurance coverage.  Attending physician advised switching  from Invega to risperidone due to this; discussed with patient risks, benefits, potential side effects, patient is agreeable to medication change.  Initial risperidone dose administered prior to discharge, which was well-tolerated.  Patient verbalizes understanding for close outpatient follow-up for ongoing medication management.  Safe discharge planning completed with patient's identified support person, roommate and friend [name].  Rhoda does not voice any safety concerns and is agreeable to patient's discharge.  She confirms patient does not have access to guns or other lethal means.  Detailed risk assessment is complete based on clinical exam and individual risk factors and acute suicide risk is low and acute violence risk is low.     Currently, all modifiable risk of harm to self/harm to others have been addressed and patient is no longer appropriate for the acute inpatient setting and is able to continue treatment for mental health needs in the community with the supports as indicated below.  Patient is educated and verbalized understanding of discharge plan of care including medications, follow-up appointments, mental health resources and further crisis services in the community.  He is instructed to call 911 or present to the nearest emergency room should  he experience any decompensation in mood, disturbance of bowel or return of suicidal/homicidal ideations.  Patient verbalizes understanding of this education and agrees to this plan of care  Physical Findings: AIMS:  , ,  ,  ,    CIWA:    COWS:        Psychiatric Specialty Exam:  Presentation  General Appearance:  Appropriate for Environment  Eye Contact: Good  Speech: Clear and Coherent  Speech Volume: Normal    Mood and Affect  Mood: Euthymic  Affect: Appropriate   Thought Process  Thought Processes: Coherent; Linear  Descriptions of Associations:Intact  Orientation:Full (Time, Place and Person)  Thought Content:WDL  Hallucinations:Hallucinations: None  Ideas of Reference:None  Suicidal Thoughts:Suicidal Thoughts: No  Homicidal Thoughts:Homicidal Thoughts: No   Sensorium  Memory: Immediate Fair; Recent Fair; Remote Fair  Judgment: Fair  Insight: Fair   Art Therapist  Concentration: Fair  Attention Span: Fair  Recall: Fair  Fund of Knowledge: Fair  Language: Fair   Psychomotor Activity  Psychomotor Activity:Psychomotor Activity: Normal  Musculoskeletal: Strength & Muscle Tone: within normal limits Gait & Station: normal Assets  Assets: Manufacturing Systems Engineer; Housing; Social Support   Sleep  Sleep:Sleep: Good    Physical Exam: Physical Exam Constitutional:      Appearance: Normal appearance.  HENT:     Head: Normocephalic and atraumatic.     Nose: Nose normal.  Eyes:     Conjunctiva/sclera: Conjunctivae normal.  Pulmonary:     Effort: Pulmonary effort is normal.  Neurological:     Mental Status: He is alert and oriented to person, place, and time.  Psychiatric:        Attention and Perception: Attention and perception normal. He does not perceive auditory or visual hallucinations.        Mood and Affect: Mood and affect normal.        Speech: Speech normal.        Behavior: Behavior is cooperative.         Thought Content: Thought content is not paranoid or delusional. Thought content does not include homicidal or suicidal ideation. Thought content does not include homicidal or suicidal plan.        Cognition and Memory: Cognition normal.        Judgment: Judgment normal.    Review of Systems  Psychiatric/Behavioral:  Negative for depression, hallucinations, substance abuse and suicidal ideas. The patient is not nervous/anxious and does not have insomnia.    Blood pressure 98/66, pulse (!) 59, temperature 98.4 F (36.9 C), temperature source Oral, resp. rate 19, height 5' 7 (1.702 m), weight 72.6 kg, SpO2 98%. Body mass index is 25.06 kg/m.   Social History   Tobacco Use  Smoking Status Every Day   Current packs/day: 1.00   Average packs/day: 1 pack/day for 26.0 years (26.0 ttl pk-yrs)   Types: Cigarettes, E-cigarettes   Passive exposure: Past  Smokeless Tobacco Never   Tobacco Cessation:  A prescription for an FDA-approved tobacco cessation medication was offered at discharge and the patient refused   Blood Alcohol level:  Lab Results  Component Value Date   [facility] <15 06/05/2024   ETH <10 04/03/2022    Metabolic Disorder Labs:  Lab Results  Component Value Date   HGBA1C 4.7 (L) 06/05/2024   MPG 88.19 06/05/2024   MPG 96.8 04/03/2022   Lab Results  Component Value Date   PROLACTIN 14.9 06/05/2024   PROLACTIN 15.3 11/03/2021   Lab Results  Component Value Date   CHOL 158 06/05/2024   TRIG 54 06/05/2024   HDL 44 06/05/2024   CHOLHDL 3.6 06/05/2024   VLDL 11 06/05/2024   LDLCALC 103 (H) 06/05/2024   LDLCALC 76 04/06/2024    See Psychiatric Specialty Exam and Suicide Risk Assessment completed by Attending Physician prior to discharge.  Discharge destination:  Home  Is patient on multiple antipsychotic therapies at discharge:  No   Has Patient had three or more failed trials of antipsychotic monotherapy by history:  No  Recommended Plan for Multiple  Antipsychotic Therapies: NA  Discharge Instructions     Diet - low sodium heart healthy   Complete by: As directed    Increase activity slowly   Complete by: As directed       Allergies as of 06/12/2024       Reactions   Aspartame And Phenylalanine Anaphylaxis, Hives, Diarrhea, Other (See Comments)   Artifical sweetners - diarrhea   Benadryl  [diphenhydramine ] Anaphylaxis, Diarrhea, Other (See Comments)   Blisters   Other Anaphylaxis, Nausea And Vomiting, Rash, Other (See Comments)   Aspartame- Blisters Dust- Worsens asthma Ragweed- Worsens asthma, face gets red, and sneezing Animal Fur/Dander- Worsens asthma and sneezing   Scallops [shellfish Allergy] Anaphylaxis, Diarrhea, Nausea And Vomiting   Tegretol [carbamazepine] Anaphylaxis, Hives, Diarrhea, Other (See Comments)   Blisters in mouth and increase in seizures   Yellow Jacket Venom [bee Venom] Anaphylaxis, Diarrhea, Nausea And Vomiting, Other (See Comments)   Seizures and numbness   Pollen Extract Other (See Comments)   Runny nose, eyes, and asthma worsens   Adhesive [tape] Rash   Latex Hives, Rash, Other (See Comments)   Blisters, also- Condoms and dental encounters        Medication List     STOP taking these medications    ARIPiprazole  20 MG tablet Commonly known as: ABILIFY    hydrOXYzine  25 MG tablet Commonly known as: ATARAX    ICY HOT BACK EX       TAKE these medications      Indication  acetaminophen  500 MG tablet Commonly known as: TYLENOL  Take 500 mg by mouth every 6 (six) hours as needed (For rheumatoid arthritis and hip dysplasia).  Indication: Pain   FLUoxetine  20 MG capsule Commonly known as: PROZAC  Take 3 capsules (60 mg total) by mouth daily. Start taking on: June 13, 2024 What changed:  medication strength how much to take  Indication: Schizoaffective disorder, bipolar type   gabapentin  300 MG capsule Commonly known as: Neurontin  Take 1 capsule (300 mg total) by mouth at  bedtime.  Indication: Neuropathic Pain   multivitamin with minerals Tabs tablet Take 1 tablet by mouth daily.  Indication: Vitamin Deficiency   risperiDONE 3 MG tablet Commonly known as: RisperDAL Take 1 tablet (3 mg total) by mouth at bedtime.  Indication: Schizophrenia        Follow-up Information     [facility] Follow up.   Specialty: Urgent Care Why: In person psychiatry appointment is 06/16/24 IS 2:30 PM. 06/24/24 at 3:00 PM. Contact information: [address] [place]  [address] [phone]        [facility] Follow up.   Why: In person psychiatry appointment is 06/16/24 AT 2:30 PM w/ Dr. Brooks, MD.   In person therapy appointment is 06/24/24 at 3:00 PM w/ Juliene MATSU., LCSW. Contact information: [address] [place], [place] [address]  Phone: [phone] Fax: [phone]                Follow-up recommendations:  Activity:  as tolerated    Signed: Camelia LITTIE Lukes, PA-C 06/12/2024, 1:22 PM

## 2024-06-12 NOTE — Progress Notes (Signed)
  [facility] Adult Case Management Discharge Plan :  Will you be returning to the same living situation after discharge:  Yes,  pt plans to return home upon discharge.  At discharge, do you have transportation home?: Yes,  pt received taxi voucher.  Do you have the ability to pay for your medications: Yes,  MEDICARE / MEDICARE PART A AND B  Release of information consent forms completed and in the chart;  Patient's signature needed at discharge.  Patient to Follow up at:  Follow-up Information     [facility] Follow up.   Specialty: Urgent Care Why: In person psychiatry appointment is 06/16/24 IS 2:30 PM. 06/24/24 at 3:00 PM. Contact information: [address] [place]  [address] [phone]        [facility] Follow up.   Why: In person psychiatry appointment is 06/16/24 AT 2:30 PM w/ Dr. Brooks, MD.   In person therapy appointment is 06/24/24 at 3:00 PM w/ Juliene MATSU., LCSW. Contact information: [address] [place], [place] [address]  Phone: [phone] Fax: [phone]                Next level of care provider has access to [facility] Link:no  Safety Planning and Suicide Prevention discussed: Yes,  SPE completed with friend, [name].      Has patient been referred to the Quitline?: Patient refused referral for treatment  Patient has been referred for addiction treatment: No known substance use disorder.  Nadara JONELLE Fam, LCSW 06/12/2024, 1:04 PM

## 2024-06-12 NOTE — Plan of Care (Signed)
  Problem: Education: Goal: Knowledge of  General Education information/materials will improve 06/12/2024 1122 by Shirley Jon FALCON, RN Outcome: Completed/Met 06/12/2024 0920 by Shirley Jon FALCON, RN Outcome: Progressing Goal: Emotional status will improve 06/12/2024 1122 by Shirley Jon FALCON, RN Outcome: Completed/Met 06/12/2024 0920 by Shirley Jon FALCON, RN Outcome: Progressing Goal: Mental status will improve 06/12/2024 1122 by Shirley Jon FALCON, RN Outcome: Completed/Met 06/12/2024 0920 by Shirley Jon FALCON, RN Outcome: Progressing Goal: Verbalization of understanding the information provided will improve 06/12/2024 1122 by Shirley Jon FALCON, RN Outcome: Completed/Met 06/12/2024 0920 by Shirley Jon FALCON, RN Outcome: Progressing   Problem: Activity: Goal: Interest or engagement in activities will improve 06/12/2024 1122 by Shirley Jon FALCON, RN Outcome: Completed/Met 06/12/2024 0920 by Shirley Jon FALCON, RN Outcome: Progressing Goal: Sleeping patterns will improve 06/12/2024 1122 by Shirley Jon FALCON, RN Outcome: Completed/Met 06/12/2024 0920 by Shirley Jon FALCON, RN Outcome: Progressing   Problem: Coping: Goal: Ability to verbalize frustrations and anger appropriately will improve 06/12/2024 1122 by Shirley Jon FALCON, RN Outcome: Completed/Met 06/12/2024 0920 by Shirley Jon FALCON, RN Outcome: Progressing Goal: Ability to demonstrate self-control will improve 06/12/2024 1122 by Shirley Jon FALCON, RN Outcome: Completed/Met 06/12/2024 0920 by Shirley Jon FALCON, RN Outcome: Progressing   Problem: Health Behavior/Discharge Planning: Goal: Identification of resources available to assist in meeting health care needs will improve 06/12/2024 1122 by Shirley Jon FALCON, RN Outcome: Completed/Met 06/12/2024 0920 by Shirley Jon FALCON, RN Outcome: Progressing Goal: Compliance with treatment plan for underlying cause of condition will improve 06/12/2024 1122 by  Shirley Jon FALCON, RN Outcome: Completed/Met 06/12/2024 0920 by Shirley Jon FALCON, RN Outcome: Progressing   Problem: Physical Regulation: Goal: Ability to maintain clinical measurements within normal limits will improve 06/12/2024 1122 by Shirley Jon FALCON, RN Outcome: Completed/Met 06/12/2024 0920 by Shirley Jon FALCON, RN Outcome: Progressing   Problem: Safety: Goal: Periods of time without injury will increase 06/12/2024 1122 by Shirley Jon FALCON, RN Outcome: Completed/Met 06/12/2024 0920 by Shirley Jon FALCON, RN Outcome: Progressing

## 2024-06-12 NOTE — Progress Notes (Signed)
 Amanda [name]'s face brightens upon eye contact.  Denies SI/HI/AVH and is not endorsing anx/dep at this moment.  He's cooperative and pleasant.  Came in seeing shawdow demons and blood everywhere which is in stark contrast to his usual hallucinations which are unicorns and rainbows.  Physician plans to D/C today.

## 2024-06-12 NOTE — Group Note (Signed)
 Recreation Therapy Group Note   Group Topic:Leisure Education  Group Date: 06/12/2024 Start Time: 1100 End Time: 1150 Facilitators: Celestia Jeoffrey BRAVO, LRT, CTRS Location: Craft Room  Group Description: Leisure. Patients were given the option to choose from journaling, coloring, drawing, making origami, playing with playdoh, listening to music or singing karaoke. LRT and pts discussed the meaning of leisure, the importance of participating in leisure during their free time/when they're outside of the hospital, as well as how our leisure interests can also serve as coping skills.   Goal Area(s) Addressed:  Patient will identify a current leisure interest.  Patient will learn the definition of "leisure". Patient will practice making a positive decision. Patient will have the opportunity to try a new leisure activity. Patient will communicate with peers and LRT.   Affect/Mood: Appropriate   Participation Level: Active and Engaged   Participation Quality: Independent   Behavior: Calm and Cooperative   Speech/Thought Process: Coherent   Insight: Good   Judgement: Good   Modes of Intervention: Education, Exploration, and Music   Patient Response to Interventions:  Attentive, Engaged, Interested , and Receptive   Education Outcome:  Acknowledges education   Clinical Observations/Individualized Feedback: Amanda [name] was active in their participation of session activities and group discussion. Pt identified play video games and write poetry as things he does in his free time.    Plan: Continue to engage patient in RT group sessions 2-3x/week.   Jeoffrey BRAVO Celestia, LRT, CTRS 06/12/2024 1:16 PM

## 2024-06-12 NOTE — Progress Notes (Signed)
   06/12/24 0810  Psych Admission Type (Psych Patients Only)  Admission Status Voluntary  Psychosocial Assessment  Patient Complaints None  Eye Contact None  Facial Expression Flat  Affect Appropriate to circumstance  Speech Soft  Interaction Assertive  Motor Activity Slow  Appearance/Hygiene Layered clothes  Behavior Characteristics Cooperative  Mood Pleasant  Aggressive Behavior  Targets Self  Type of Behavior Other (Comment) (pt chews inside of mouth)  Effect No apparent injury  Thought Process  Coherency WDL  Content WDL  Delusions None reported or observed  Perception WDL  Hallucination None reported or observed  Judgment Poor  Confusion WDL  Danger to Self  Current suicidal ideation? Denies  Description of Suicide Plan no plan  Self-Injurious Behavior Some self-injurious ideation observed or expressed.  No lethal plan expressed   Agreement Not to Harm Self Yes (chews inside of mouth)  Description of Agreement Verbal  Danger to Others  Danger to Others None reported or observed

## 2024-06-16 ENCOUNTER — Encounter (HOSPITAL_COMMUNITY): Payer: Self-pay | Admitting: Psychiatry

## 2024-06-16 ENCOUNTER — Other Ambulatory Visit: Payer: Self-pay

## 2024-06-16 ENCOUNTER — Telehealth (HOSPITAL_COMMUNITY): Payer: Medicare (Managed Care) | Admitting: Psychiatry

## 2024-06-16 ENCOUNTER — Telehealth (INDEPENDENT_AMBULATORY_CARE_PROVIDER_SITE_OTHER): Payer: Medicare (Managed Care) | Admitting: Psychiatry

## 2024-06-16 DIAGNOSIS — F1091 Alcohol use, unspecified, in remission: Secondary | ICD-10-CM

## 2024-06-16 DIAGNOSIS — F251 Schizoaffective disorder, depressive type: Secondary | ICD-10-CM | POA: Diagnosis not present

## 2024-06-16 DIAGNOSIS — F603 Borderline personality disorder: Secondary | ICD-10-CM | POA: Diagnosis not present

## 2024-06-16 DIAGNOSIS — F4310 Post-traumatic stress disorder, unspecified: Secondary | ICD-10-CM

## 2024-06-16 MED ORDER — GABAPENTIN 300 MG PO CAPS
300.0000 mg | ORAL_CAPSULE | Freq: Every evening | ORAL | 2 refills | Status: AC | PRN
  Filled 2024-06-16: qty 30, 30d supply, fill #0

## 2024-06-16 MED ORDER — HYDROXYZINE HCL 25 MG PO TABS
25.0000 mg | ORAL_TABLET | Freq: Every day | ORAL | 2 refills | Status: AC | PRN
  Filled 2024-06-16: qty 30, 30d supply, fill #0

## 2024-06-16 MED ORDER — FLUOXETINE HCL 20 MG PO CAPS: 60.0000 mg | ORAL_CAPSULE | Freq: Every day | ORAL | 2 refills | Status: AC

## 2024-06-16 MED ORDER — RISPERIDONE 3 MG PO TABS: 3.0000 mg | ORAL_TABLET | Freq: Every day | ORAL | 2 refills | Status: AC
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
## 2024-06-16 NOTE — Patient Instructions (Signed)

## 2024-06-23 ENCOUNTER — Other Ambulatory Visit: Payer: Self-pay

## 2024-06-24 ENCOUNTER — Other Ambulatory Visit: Payer: Self-pay

## 2024-06-24 ENCOUNTER — Ambulatory Visit (INDEPENDENT_AMBULATORY_CARE_PROVIDER_SITE_OTHER): Admitting: Licensed Clinical Social Worker

## 2024-06-24 DIAGNOSIS — F251 Schizoaffective disorder, depressive type: Secondary | ICD-10-CM | POA: Diagnosis not present

## 2024-06-24 NOTE — Progress Notes (Unsigned)
 THERAPIST PROGRESS NOTE  Session Time: 25  Participation Level: Active  Behavioral Response: CasualAlertAnxious  Type of Therapy: Individual Therapy  Treatment Goals addressed:  Active     Depression     STG: Amanda Amanda [name] WILL COMPLETE AT LEAST 80% OF ASSIGNED HOMEWORK (Progressing)     Start:  09/25/21    Expected End:  09/18/24         STG: Reduce overall depression score by a minimum of 25% on the Patient Health Questionnaire (PHQ-9) or the Montgomery-Asberg Depression Rating Scale (MADRS) (Completed/Met)     Start:  09/25/21    Expected End:  05/18/22    Resolved:  12/13/21        Walk 3 x weekly  (Completed/Met)     Start:  10/17/21    Expected End:  10/18/22    Resolved:  08/21/22       Goal Note     Pt reports walking daily          Listening to classical or write 3 x weekly  (Completed/Met)     Start:  10/17/21    Expected End:  10/18/22    Resolved:  10/02/22       Goal Note     Pt reports listening to classical music to help decrease depression and anxiety           Identify 3 trigger for depression and anxiety  (Progressing)     Start:  10/17/21    Expected End:  09/18/24            Decrease IP psych admission to less than 1 x per year.  (Completed/Met)     Start:  10/17/21    Expected End:  04/19/23    Resolved:  02/20/23       Goal Note     Pt has had no IP admission in the last year          WORK WITH Amanda Amanda [name] TO TRACK SYMPTOMS, TRIGGERS AND/OR SKILL USE THROUGH A MOOD CHART, DIARY CARD, OR JOURNAL (Completed)     Start:  09/25/21    End:  12/13/21      ENCOURAGE Amanda Amanda [name] TO PARTICIPATE IN RECOVERY PEER SUPPORT ACTIVITIES WEEKLY (Completed)     Start:  09/25/21    End:  01/02/22      PROVIDE Amanda Amanda [name] WITH EDUCATIONAL INFORMATION AND READING MATERIAL ON DISSOCIATION, ITS CAUSES, AND SYMPTOMS (Completed)     Start:  09/25/21    End:  12/13/21      WORK WITH Amanda Amanda [name] TO IDENTIFY THE MAJOR COMPONENTS OF A  RECENT EPISODE OF DEPRESSION: PHYSICAL SYMPTOMS, MAJOR THOUGHTS AND IMAGES, AND MAJOR BEHAVIORS THEY EXPERIENCED (Completed)     Start:  09/25/21    End:  12/13/21         ProgressTowards Goals: Progressing  Interventions: CBT, DBT, and Motivational Interviewing  Summary: Amanda Amanda [name] is a 43 y.o. adult who presents with    Suicidal/Homicidal: Nowithout intent/plan  Therapist Response:    S: Subjective Amanda [name] reports a recent history of crisis with increased auditory hallucinations several weeks ago, which led to psychiatric hospitalization. He states that his medications were adjusted during that admission and reports, I am feeling a lot better. He describes a significant decrease in auditory hallucinations since discharge. Amanda [name] denies any current suicidal or homicidal ideation. He identifies triggers for suicidal thoughts and auditory hallucinations, including stress and environmental noise from his upstairs neighbors who frequently argue loudly.  Despite submitting noise complaints, the issue persists. He shares that during his recent crisis, his usual coping strategies--writing, listening to music, and social support--were ineffective.  O: Objective Amanda [name] was alert and oriented 5. He presented as pleasant, cooperative, and maintained good eye contact throughout the session. He engaged appropriately in the therapeutic process and was dressed casually. His mood and affect appeared euthymic.  A: Assessment Amanda [name] is demonstrating improved stability following recent medication adjustments and hospitalization. He shows insight into his triggers and is motivated to continue therapy. His report of decreased auditory hallucinations and denial of suicidal ideation indicate progress. Environmental stressors (e.g., neighbor conflict) continue to pose challenges and may exacerbate symptoms during periods of heightened stress.  P: Plan / Intervention  LCSW and patient reviewed and completed an  anger diary; patient provided with worksheet for continued use.  Reviewed and reinforced coping strategies, including writing, drawing, music, and use of social supports.  LCSW provided empathetic validation and utilized psychodynamic therapy to facilitate expression of thoughts and emotions in a nonjudgmental setting.  Discussed LCSW's upcoming transfer to the [facility] [place], [place] office on December 14.  Patient to continue services at [facility], as transportation via bus and preference for in-person sessions were noted.  Reviewed and updated safety plan; patient verbally agreed and was provided with contact information for [facility] [place], [place]) and the Suicide Prevention Hotline.   Plan: Return again in 5 weeks.  Diagnosis: Schizoaffective disorder, depressive type (HCC)  Collaboration of Care: Other Pt to continued with medication mgnt at [facility]   Patient/Guardian was advised Release of Information must be obtained prior to any record release in order to collaborate their care with an outside provider. Patient/Guardian was advised if they have not already done so to contact the registration department to sign all necessary forms in order for us  to release information regarding their care.   Consent: Patient/Guardian gives verbal consent for treatment and assignment of benefits for services provided during this visit. Patient/Guardian expressed understanding and agreed to proceed.   [name], LCSW 06/24/2024

## 2024-06-25 ENCOUNTER — Other Ambulatory Visit: Payer: Self-pay
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
## 2024-07-23 ENCOUNTER — Encounter (HOSPITAL_COMMUNITY)

## 2024-07-27 ENCOUNTER — Ambulatory Visit: Payer: Medicare (Managed Care) | Admitting: Family Medicine

## 2024-07-28 ENCOUNTER — Ambulatory Visit (INDEPENDENT_AMBULATORY_CARE_PROVIDER_SITE_OTHER): Admitting: Licensed Clinical Social Worker

## 2024-07-28 DIAGNOSIS — F251 Schizoaffective disorder, depressive type: Secondary | ICD-10-CM

## 2024-07-28 NOTE — Progress Notes (Signed)
 THERAPIST PROGRESS NOTE  Virtual Visit via Video Note  I connected with Amanda [name] on 07/28/24 at 11:00 AM EST by a video enabled telemedicine application and verified that I am speaking with the correct person using two identifiers.  Location: Patient: [facility]  Provider: Providers Home Office    I discussed the limitations of evaluation and management by telemedicine and the availability of in person appointments. The patient expressed understanding and agreed to proceed.  I discussed the assessment and treatment plan with the patient. The patient was provided an opportunity to ask questions and all were answered. The patient agreed with the plan and demonstrated an understanding of the instructions.   The patient was advised to call back or seek an in-person evaluation if the symptoms worsen or if the condition fails to improve as anticipated.  I provided 30 minutes of non-face-to-face time during this encounter.   Amanda GORMAN Patee, LCSW   Participation Level: Active  Behavioral Response: CasualAlertAnxious  Type of Therapy: Individual Therapy  Treatment Goals addressed:  Active     Depression     STG: Amanda [name] WILL COMPLETE AT LEAST 80% OF ASSIGNED HOMEWORK (Progressing)     Start:  09/25/21    Expected End:  09/18/24         STG: Reduce overall depression score by a minimum of 25% on the Patient Health Questionnaire (PHQ-9) or the Montgomery-Asberg Depression Rating Scale (MADRS) (Completed/Met)     Start:  09/25/21    Expected End:  05/18/22    Resolved:  12/13/21        Walk 3 x weekly  (Completed/Met)     Start:  10/17/21    Expected End:  10/18/22    Resolved:  08/21/22       Goal Note     Pt reports walking daily          Listening to classical or write 3 x weekly  (Completed/Met)     Start:  10/17/21    Expected End:  10/18/22    Resolved:  10/02/22       Goal Note     Pt reports listening to classical music to help decrease  depression and anxiety           Identify 3 trigger for depression and anxiety  (Progressing)     Start:  10/17/21    Expected End:  09/18/24            Decrease IP psych admission to less than 1 x per year.  (Completed/Met)     Start:  10/17/21    Expected End:  04/19/23    Resolved:  02/20/23       Goal Note     Pt has had no IP admission in the last year          WORK WITH Amanda [name] TO TRACK SYMPTOMS, TRIGGERS AND/OR SKILL USE THROUGH A MOOD CHART, DIARY CARD, OR JOURNAL (Completed)     Start:  09/25/21    End:  12/13/21      ENCOURAGE Amanda [name] TO PARTICIPATE IN RECOVERY PEER SUPPORT ACTIVITIES WEEKLY (Completed)     Start:  09/25/21    End:  01/02/22      PROVIDE Amanda [name] WITH EDUCATIONAL INFORMATION AND READING MATERIAL ON DISSOCIATION, ITS CAUSES, AND SYMPTOMS (Completed)     Start:  09/25/21    End:  12/13/21      WORK WITH Amanda [name] TO IDENTIFY THE MAJOR  COMPONENTS OF A RECENT EPISODE OF DEPRESSION: PHYSICAL SYMPTOMS, MAJOR THOUGHTS AND IMAGES, AND MAJOR BEHAVIORS THEY EXPERIENCED (Completed)     Start:  09/25/21    End:  12/13/21         ProgressTowards Goals: Progressing  Interventions: CBT and Motivational Interviewing   Suicidal/Homicidal: Nowithout intent/plan  Therapist Response:    S: Subjective  [name] presents alert and oriented 5. He reports taking his medications consistently as prescribed. He denies auditory or visual hallucinations except during highly stressful situations. He has been using coping strategies for noise, including noise-proof headphones that allow him to focus on music and reduce distress caused by loud neighbors. He shares that several weeks ago the noise triggered a crisis, leading him to present to Behavioral Health Ur[facility] through [facility].  [name] denies suicidal or homicidal ideation. He reports current stressors related to illness, stating that he currently has "the flu." He endorses symptoms of  tension, worry, restlessness, and fatigue. He also reports an overall decrease in psychotic symptoms since his previous hospitalization, based on his own observations.  LCSW discussed upcoming transfer to a new [place] therapist through the [facility]. Patient verbalized agreement and understanding that scheduling staff will contact him once new provider schedules open.  O: Objective  Alert and oriented 5  Pleasant, cooperative; maintained good eye contact  Casually dressed; engaged well in session  Mood anxious; affect appropriate  No evidence of acute safety risk or overt psychosis  Demonstrates use of coping strategies  Insight fair to good  A: Assessment [name] presents with anxiety and residual psychotic symptoms that appear to have decreased since hospitalization. Current stressors include physical illness and environmental noise triggers. He is medication-compliant, engaged in coping skills, and motivated in treatment. No current safety concerns are reported.  P: Plan  Continue psychodynamic therapy to support expression of thoughts, feelings, and emotions in a nonjudgmental environment.  Utilize supportive therapy for praise, encouragement, and reinforcement of coping strategies.  Incorporate solution-focused interventions, including providing community housing resources.  Continue strength-based work, reviewing both short- and long-term goals.  Proceed with transfer to new therapist at [facility]; patient to expect follow-up from scheduling staff.  Monitor anxiety and residual psychosis symptoms; reinforce medication adherence and coping strategies.   Plan: Return again in 4 weeks.  Diagnosis: Schizoaffective disorder, depressive type (HCC)  Collaboration of Care: Other None today   Patient/Guardian was advised Release of Information must be obtained prior to any record release in order to collaborate  their care with an outside provider. Patient/Guardian was advised if they have not already done so to contact the registration department to sign all necessary forms in order for us  to release information regarding their care.   Consent: Patient/Guardian gives verbal consent for treatment and assignment of benefits for services provided during this visit. Patient/Guardian expressed understanding and agreed to proceed.   Amanda GORMAN Patee, LCSW 07/28/2024

## 2024-07-29 ENCOUNTER — Telehealth: Payer: Self-pay

## 2024-07-29 NOTE — Telephone Encounter (Signed)
 Left VM for patient to return call to office to resch missed appt on 12/08

## 2024-08-25 ENCOUNTER — Ambulatory Visit (HOSPITAL_COMMUNITY)

## 2024-08-25 ENCOUNTER — Encounter (HOSPITAL_COMMUNITY): Payer: Self-pay

## 2024-08-25 DIAGNOSIS — F4310 Post-traumatic stress disorder, unspecified: Secondary | ICD-10-CM
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
## 2024-09-25 ENCOUNTER — Ambulatory Visit (HOSPITAL_COMMUNITY)

## 2024-09-25 ENCOUNTER — Encounter (HOSPITAL_COMMUNITY): Payer: Self-pay

## 2024-09-25 DIAGNOSIS — F4310 Post-traumatic stress disorder, unspecified: Secondary | ICD-10-CM

## 2024-09-25 NOTE — Progress Notes (Signed)
 "  THERAPIST PROGRESS NOTE  Virtual Visit via Video Note  I connected with Amanda [name] on 09/25/24 at 10:00 AM EST by a video enabled telemedicine application and verified that I am speaking with the correct person using two identifiers.  Location: Patient: Home Address Provider: Clinician Home Office   I discussed the limitations of evaluation and management by telemedicine and the availability of in person appointments. The patient expressed understanding and agreed to proceed.    I discussed the assessment and treatment plan with the patient. The patient was provided an opportunity to ask questions and all were answered. The patient agreed with the plan and demonstrated an understanding of the instructions.   The patient was advised to call back or seek an in-person evaluation if the symptoms worsen or if the condition fails to improve as anticipated.  I provided 53 minutes of non-face-to-face time during this encounter.   Amanda [name], [facility]   Session Time: 10:05-10:58am  Participation Level: Active  Behavioral Response: CasualAlertEuthymic  Type of Therapy: Individual Therapy  Treatment Goals addressed:  STG: Amanda [name] will practice emotion regulation skills 3 time(s) per week for the next 4 week(s)  STG: Practice interpersonal effectiveness skills 3 times per week for the next 4 weeks  LTG: Amanda [name] is no longer impaired in daily function by PTSD symptoms as measured by less than PHQ and GAD score of 4.  Priority: Expected end: Interventions Encourage Amanda [name] to join an abuse or trauma support group Frequency: Educate Amanda [name] on trauma influenced cognitive distortions Frequency: Work with Amanda [name] to track pleasant activities in a daily diary card, activity log, or journal    ProgressTowards Goals: Initial  Interventions: CBT  Summary: [name] is a 44 year old single female that presented today with diagnoses of PTSD (post-traumatic  stress disorder) [F43.10. [name] reports worried that he may go into a bad state because he's unable to type since her laptop stopped working. 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 [name] reports using cell phone to type book but keyboard is too small. [name] reports asking friends to use an older device to allow him to type. [name] reports wanting to publish a book in the future. [name] also reports missing son but acknowledges he did what was best for his son. [name] reports know longer having contact with son since adoption but never pressured son to see him as a father.   Suicidal/Homicidal: None; without intent or plan.   Therapist Response: Clinician met with [name]  today for virtual therapy appointment and assessed for safety, medication compliance, and sobriety. [name] presented for session on time and was alert, oriented x5, with no evidence or self-report of active SI/HI. [name] denied any abuse of alcohol or illicit substances. [name] reported compliance with all medication. Clinician inquired about [name] current emotional ratings, as well as any significant changes in thoughts, feelings or behavior since previous check-in. Therapist recommends a refurbished store for a new device. Therapist applauds [name] for not pressuring son about his identity. Therapist suggest the lack of technical device has caused an increase in thoughts about son.    Plan: Return again in 4 weeks.  Diagnosis: PTSD (post-traumatic stress disorder)  Collaboration of Care: currently medication management  Patient/Guardian was advised Release of Information must be obtained prior to any record release in order to collaborate their care with an outside provider. Patient/Guardian was advised if they have not already done so to contact the registration department  to sign all necessary forms in order for us  to release information regarding their care.   Consent: Patient/Guardian gives verbal consent for treatment and assignment of  benefits for services provided during this visit. Patient/Guardian expressed understanding and agreed to proceed.   Amanda [name], [facility] 09/25/2024  "

## 2024-10-22 ENCOUNTER — Ambulatory Visit (HOSPITAL_COMMUNITY)
